# Patient Record
Sex: Female | Born: 1935 | Race: White | Hispanic: No | Smoking: Never smoker
Health system: Southern US, Community
[De-identification: ages and names within clinical notes are randomized; demographics above are authoritative.]

## PROBLEM LIST (undated history)

## (undated) DIAGNOSIS — I4821 Permanent atrial fibrillation: Secondary | ICD-10-CM

## (undated) DIAGNOSIS — I517 Cardiomegaly: Secondary | ICD-10-CM

## (undated) DIAGNOSIS — Z950 Presence of cardiac pacemaker: Secondary | ICD-10-CM

## (undated) DIAGNOSIS — E119 Type 2 diabetes mellitus without complications: Secondary | ICD-10-CM

## (undated) DIAGNOSIS — I071 Rheumatic tricuspid insufficiency: Secondary | ICD-10-CM

## (undated) DIAGNOSIS — I509 Heart failure, unspecified: Secondary | ICD-10-CM

## (undated) DIAGNOSIS — E079 Disorder of thyroid, unspecified: Secondary | ICD-10-CM

## (undated) DIAGNOSIS — I340 Nonrheumatic mitral (valve) insufficiency: Secondary | ICD-10-CM

## (undated) DIAGNOSIS — I10 Essential (primary) hypertension: Secondary | ICD-10-CM

## (undated) DIAGNOSIS — I272 Pulmonary hypertension, unspecified: Secondary | ICD-10-CM

## (undated) HISTORY — PX: ABDOMINAL HYSTERECTOMY: SHX81

## (undated) HISTORY — DX: Pulmonary hypertension, unspecified: I27.20

## (undated) HISTORY — PX: TUBAL LIGATION: SHX77

## (undated) HISTORY — PX: THYROIDECTOMY: SHX17

## (undated) HISTORY — DX: Nonrheumatic mitral (valve) insufficiency: I34.0

## (undated) HISTORY — DX: Rheumatic tricuspid insufficiency: I07.1

## (undated) HISTORY — PX: OTHER SURGICAL HISTORY: SHX169

## (undated) HISTORY — PX: CHOLECYSTECTOMY: SHX55

## (undated) HISTORY — DX: Cardiomegaly: I51.7

## (undated) HISTORY — DX: Permanent atrial fibrillation: I48.21

## (undated) HISTORY — PX: CARDIOVERSION: SHX1299

---

## 1998-04-26 ENCOUNTER — Ambulatory Visit (HOSPITAL_COMMUNITY): Admission: RE | Admit: 1998-04-26 | Discharge: 1998-04-26 | Payer: Self-pay | Admitting: Cardiovascular Disease

## 1998-04-26 ENCOUNTER — Encounter: Payer: Self-pay | Admitting: Cardiovascular Disease

## 1998-06-03 ENCOUNTER — Ambulatory Visit (HOSPITAL_COMMUNITY): Admission: RE | Admit: 1998-06-03 | Discharge: 1998-06-03 | Payer: Self-pay | Admitting: Cardiovascular Disease

## 1998-08-01 ENCOUNTER — Ambulatory Visit (HOSPITAL_COMMUNITY): Admission: RE | Admit: 1998-08-01 | Discharge: 1998-08-01 | Payer: Self-pay | Admitting: Cardiovascular Disease

## 1998-08-01 HISTORY — PX: CARDIOVERSION: SHX1299

## 1999-07-21 ENCOUNTER — Encounter: Admission: RE | Admit: 1999-07-21 | Discharge: 1999-07-21 | Payer: Self-pay | Admitting: Sports Medicine

## 1999-07-21 ENCOUNTER — Encounter: Payer: Self-pay | Admitting: Sports Medicine

## 2004-05-20 ENCOUNTER — Inpatient Hospital Stay (HOSPITAL_COMMUNITY): Admission: EM | Admit: 2004-05-20 | Discharge: 2004-05-30 | Payer: Self-pay | Admitting: Emergency Medicine

## 2004-05-26 HISTORY — PX: PACEMAKER INSERTION: SHX728

## 2009-02-28 HISTORY — PX: NM MYOCAR PERF WALL MOTION: HXRAD629

## 2011-02-25 HISTORY — PX: US ECHOCARDIOGRAPHY: HXRAD669

## 2012-06-04 ENCOUNTER — Inpatient Hospital Stay (HOSPITAL_COMMUNITY)
Admission: EM | Admit: 2012-06-04 | Discharge: 2012-06-09 | DRG: 481 | Disposition: A | Discharge status: Skilled Nursing Facility | Payer: Medicare Other | Attending: Internal Medicine | Admitting: Internal Medicine

## 2012-06-04 ENCOUNTER — Encounter (HOSPITAL_COMMUNITY): Payer: Self-pay | Admitting: Emergency Medicine

## 2012-06-04 DIAGNOSIS — R0600 Dyspnea, unspecified: Secondary | ICD-10-CM

## 2012-06-04 DIAGNOSIS — W010XXA Fall on same level from slipping, tripping and stumbling without subsequent striking against object, initial encounter: Secondary | ICD-10-CM | POA: Diagnosis present

## 2012-06-04 DIAGNOSIS — I4891 Unspecified atrial fibrillation: Secondary | ICD-10-CM

## 2012-06-04 DIAGNOSIS — R112 Nausea with vomiting, unspecified: Secondary | ICD-10-CM | POA: Diagnosis not present

## 2012-06-04 DIAGNOSIS — E119 Type 2 diabetes mellitus without complications: Secondary | ICD-10-CM

## 2012-06-04 DIAGNOSIS — Z91013 Allergy to seafood: Secondary | ICD-10-CM

## 2012-06-04 DIAGNOSIS — S42213A Unspecified displaced fracture of surgical neck of unspecified humerus, initial encounter for closed fracture: Secondary | ICD-10-CM | POA: Diagnosis present

## 2012-06-04 DIAGNOSIS — J40 Bronchitis, not specified as acute or chronic: Secondary | ICD-10-CM | POA: Diagnosis not present

## 2012-06-04 DIAGNOSIS — Z6833 Body mass index (BMI) 33.0-33.9, adult: Secondary | ICD-10-CM

## 2012-06-04 DIAGNOSIS — S72143A Displaced intertrochanteric fracture of unspecified femur, initial encounter for closed fracture: Principal | ICD-10-CM | POA: Diagnosis present

## 2012-06-04 DIAGNOSIS — D689 Coagulation defect, unspecified: Secondary | ICD-10-CM

## 2012-06-04 DIAGNOSIS — E669 Obesity, unspecified: Secondary | ICD-10-CM | POA: Diagnosis present

## 2012-06-04 DIAGNOSIS — Z7901 Long term (current) use of anticoagulants: Secondary | ICD-10-CM

## 2012-06-04 DIAGNOSIS — Z96659 Presence of unspecified artificial knee joint: Secondary | ICD-10-CM

## 2012-06-04 DIAGNOSIS — S42291A Other displaced fracture of upper end of right humerus, initial encounter for closed fracture: Secondary | ICD-10-CM

## 2012-06-04 DIAGNOSIS — R339 Retention of urine, unspecified: Secondary | ICD-10-CM | POA: Diagnosis not present

## 2012-06-04 DIAGNOSIS — D6832 Hemorrhagic disorder due to extrinsic circulating anticoagulants: Secondary | ICD-10-CM

## 2012-06-04 DIAGNOSIS — Y92009 Unspecified place in unspecified non-institutional (private) residence as the place of occurrence of the external cause: Secondary | ICD-10-CM

## 2012-06-04 DIAGNOSIS — Z8249 Family history of ischemic heart disease and other diseases of the circulatory system: Secondary | ICD-10-CM

## 2012-06-04 DIAGNOSIS — S72001D Fracture of unspecified part of neck of right femur, subsequent encounter for closed fracture with routine healing: Secondary | ICD-10-CM

## 2012-06-04 DIAGNOSIS — E8779 Other fluid overload: Secondary | ICD-10-CM | POA: Diagnosis not present

## 2012-06-04 DIAGNOSIS — Z950 Presence of cardiac pacemaker: Secondary | ICD-10-CM

## 2012-06-04 DIAGNOSIS — Z888 Allergy status to other drugs, medicaments and biological substances status: Secondary | ICD-10-CM

## 2012-06-04 DIAGNOSIS — S42301A Unspecified fracture of shaft of humerus, right arm, initial encounter for closed fracture: Secondary | ICD-10-CM

## 2012-06-04 DIAGNOSIS — Z825 Family history of asthma and other chronic lower respiratory diseases: Secondary | ICD-10-CM

## 2012-06-04 DIAGNOSIS — Z79899 Other long term (current) drug therapy: Secondary | ICD-10-CM

## 2012-06-04 DIAGNOSIS — R791 Abnormal coagulation profile: Secondary | ICD-10-CM | POA: Diagnosis present

## 2012-06-04 DIAGNOSIS — S72001A Fracture of unspecified part of neck of right femur, initial encounter for closed fracture: Secondary | ICD-10-CM

## 2012-06-04 DIAGNOSIS — D62 Acute posthemorrhagic anemia: Secondary | ICD-10-CM | POA: Diagnosis not present

## 2012-06-04 DIAGNOSIS — T45515A Adverse effect of anticoagulants, initial encounter: Secondary | ICD-10-CM | POA: Diagnosis present

## 2012-06-04 DIAGNOSIS — E876 Hypokalemia: Secondary | ICD-10-CM | POA: Diagnosis not present

## 2012-06-04 DIAGNOSIS — I10 Essential (primary) hypertension: Secondary | ICD-10-CM

## 2012-06-04 HISTORY — DX: Essential (primary) hypertension: I10

## 2012-06-04 HISTORY — DX: Disorder of thyroid, unspecified: E07.9

## 2012-06-04 HISTORY — DX: Type 2 diabetes mellitus without complications: E11.9

## 2012-06-04 HISTORY — DX: Heart failure, unspecified: I50.9

## 2012-06-04 MED ORDER — FENTANYL CITRATE 0.05 MG/ML IJ SOLN
50.0000 ug | Freq: Once | INTRAMUSCULAR | Status: AC
  Administered 2012-06-04: 50 ug via INTRAVENOUS
  Filled 2012-06-04: qty 2

## 2012-06-04 NOTE — ED Notes (Signed)
Per EMS. Patient fell at home, onto her R side. C/o R arm, shoulder, hip pain. Instability noted to R hip. Hip stabilized via sheet, arm in sling.

## 2012-06-04 NOTE — ED Notes (Signed)
Patient moving R arm. Reminded patient to not move R arm at this time due to concern of further damage if broken or dislocated. Patient states her arm feels better when she moves it. Family at bedside.

## 2012-06-04 NOTE — ED Notes (Signed)
Bed:RESB<BR> Expected date:<BR> Expected time:<BR> Means of arrival:<BR> Comments:<BR>

## 2012-06-04 NOTE — ED Notes (Signed)
Last PO intake: 1800.

## 2012-06-05 ENCOUNTER — Emergency Department (HOSPITAL_COMMUNITY): Payer: Medicare Other

## 2012-06-05 DIAGNOSIS — S72001A Fracture of unspecified part of neck of right femur, initial encounter for closed fracture: Secondary | ICD-10-CM

## 2012-06-05 DIAGNOSIS — S72009A Fracture of unspecified part of neck of unspecified femur, initial encounter for closed fracture: Secondary | ICD-10-CM

## 2012-06-05 DIAGNOSIS — S72143A Displaced intertrochanteric fracture of unspecified femur, initial encounter for closed fracture: Principal | ICD-10-CM

## 2012-06-05 DIAGNOSIS — T458X1A Poisoning by other primarily systemic and hematological agents, accidental (unintentional), initial encounter: Secondary | ICD-10-CM

## 2012-06-05 DIAGNOSIS — S42301A Unspecified fracture of shaft of humerus, right arm, initial encounter for closed fracture: Secondary | ICD-10-CM

## 2012-06-05 DIAGNOSIS — D6832 Hemorrhagic disorder due to extrinsic circulating anticoagulants: Secondary | ICD-10-CM

## 2012-06-05 DIAGNOSIS — I10 Essential (primary) hypertension: Secondary | ICD-10-CM | POA: Diagnosis present

## 2012-06-05 DIAGNOSIS — I4891 Unspecified atrial fibrillation: Secondary | ICD-10-CM | POA: Diagnosis present

## 2012-06-05 DIAGNOSIS — E119 Type 2 diabetes mellitus without complications: Secondary | ICD-10-CM | POA: Diagnosis present

## 2012-06-05 DIAGNOSIS — S42293A Other displaced fracture of upper end of unspecified humerus, initial encounter for closed fracture: Secondary | ICD-10-CM

## 2012-06-05 DIAGNOSIS — T45511A Poisoning by anticoagulants, accidental (unintentional), initial encounter: Secondary | ICD-10-CM

## 2012-06-05 LAB — CALCIUM: Calcium: 8.4 mg/dL (ref 8.4–10.5)

## 2012-06-05 LAB — CBC WITH DIFFERENTIAL/PLATELET
Basophils Absolute: 0 10*3/uL (ref 0.0–0.1)
Eosinophils Absolute: 0 10*3/uL (ref 0.0–0.7)
HCT: 40.2 % (ref 36.0–46.0)
Hemoglobin: 13.3 g/dL (ref 12.0–15.0)
Lymphs Abs: 1.4 10*3/uL (ref 0.7–4.0)
MCH: 28.4 pg (ref 26.0–34.0)
MCHC: 33.1 g/dL (ref 30.0–36.0)
Monocytes Relative: 6 % (ref 3–12)
Neutrophils Relative %: 85 % — ABNORMAL HIGH (ref 43–77)
RBC: 4.68 MIL/uL (ref 3.87–5.11)
RDW: 15.6 % — ABNORMAL HIGH (ref 11.5–15.5)
WBC Morphology: INCREASED
WBC: 15.3 10*3/uL — ABNORMAL HIGH (ref 4.0–10.5)

## 2012-06-05 LAB — TYPE AND SCREEN: Antibody Screen: NEGATIVE

## 2012-06-05 LAB — URINALYSIS, ROUTINE W REFLEX MICROSCOPIC
Bilirubin Urine: NEGATIVE
Hgb urine dipstick: NEGATIVE
Ketones, ur: NEGATIVE mg/dL
Nitrite: NEGATIVE
Protein, ur: NEGATIVE mg/dL
Urobilinogen, UA: 0.2 mg/dL (ref 0.0–1.0)

## 2012-06-05 LAB — BASIC METABOLIC PANEL
BUN: 23 mg/dL (ref 6–23)
CO2: 25 mEq/L (ref 19–32)
Calcium: 8.9 mg/dL (ref 8.4–10.5)
Chloride: 91 mEq/L — ABNORMAL LOW (ref 96–112)
Chloride: 94 mEq/L — ABNORMAL LOW (ref 96–112)
GFR calc Af Amer: 62 mL/min — ABNORMAL LOW (ref >90)
GFR calc non Af Amer: 48 mL/min — ABNORMAL LOW (ref >90)
Glucose, Bld: 236 mg/dL — ABNORMAL HIGH (ref 70–99)
Potassium: 3 mEq/L — ABNORMAL LOW (ref 3.5–5.1)
Sodium: 130 mEq/L — ABNORMAL LOW (ref 135–145)
Sodium: 134 mEq/L — ABNORMAL LOW (ref 135–145)

## 2012-06-05 LAB — CBC
HCT: 41.6 % (ref 36.0–46.0)
MCV: 85.4 fL (ref 78.0–100.0)
Platelets: 169 10*3/uL (ref 150–400)
RBC: 4.87 MIL/uL (ref 3.87–5.11)
RDW: 16 % — ABNORMAL HIGH (ref 11.5–15.5)
WBC: 12.1 10*3/uL — ABNORMAL HIGH (ref 4.0–10.5)

## 2012-06-05 LAB — ALBUMIN: Albumin: 3.1 g/dL — ABNORMAL LOW (ref 3.5–5.2)

## 2012-06-05 LAB — SAMPLE TO BLOOD BANK

## 2012-06-05 LAB — APTT: aPTT: 45 seconds — ABNORMAL HIGH (ref 24–37)

## 2012-06-05 LAB — PROTIME-INR: Prothrombin Time: 23.8 seconds — ABNORMAL HIGH (ref 11.6–15.2)

## 2012-06-05 MED ORDER — ACETAMINOPHEN 325 MG PO TABS: 650.0000 mg | ORAL_TABLET | Freq: Four times a day (QID) | ORAL | Status: DC | PRN

## 2012-06-05 MED ORDER — PANTOPRAZOLE SODIUM 40 MG IV SOLR
40.0000 mg | INTRAVENOUS | Status: DC
  Administered 2012-06-05 – 2012-06-06 (×2): 40 mg via INTRAVENOUS
  Filled 2012-06-05 (×3): qty 40

## 2012-06-05 MED ORDER — FENTANYL CITRATE 0.05 MG/ML IJ SOLN
50.0000 ug | Freq: Once | INTRAMUSCULAR | Status: AC
  Administered 2012-06-05: 50 ug via INTRAVENOUS
  Filled 2012-06-05: qty 2

## 2012-06-05 MED ORDER — ONDANSETRON HCL 4 MG/2ML IJ SOLN
4.0000 mg | Freq: Four times a day (QID) | INTRAMUSCULAR | Status: DC | PRN
  Administered 2012-06-05: 4 mg via INTRAVENOUS
  Filled 2012-06-05: qty 2

## 2012-06-05 MED ORDER — SODIUM CHLORIDE 0.9 % IV SOLN: INTRAVENOUS | Status: DC

## 2012-06-05 MED ORDER — INSULIN ASPART 100 UNIT/ML ~~LOC~~ SOLN
0.0000 [IU] | Freq: Every day | SUBCUTANEOUS | Status: DC
Start: 2012-06-05 — End: 2012-06-09

## 2012-06-05 MED ORDER — SODIUM CHLORIDE 0.9 % IJ SOLN: 3.0000 mL | Freq: Two times a day (BID) | INTRAMUSCULAR | Status: DC

## 2012-06-05 MED ORDER — DOCUSATE SODIUM 100 MG PO CAPS
100.0000 mg | ORAL_CAPSULE | Freq: Two times a day (BID) | ORAL | Status: DC
  Administered 2012-06-05 (×2): 100 mg via ORAL
  Filled 2012-06-05 (×4): qty 1

## 2012-06-05 MED ORDER — ZOLPIDEM TARTRATE 5 MG PO TABS: 5.0000 mg | ORAL_TABLET | Freq: Every evening | ORAL | Status: DC | PRN

## 2012-06-05 MED ORDER — HYDROMORPHONE HCL PF 1 MG/ML IJ SOLN: 0.5000 mg | INTRAMUSCULAR | Status: DC | PRN

## 2012-06-05 MED ORDER — FLEET ENEMA 7-19 GM/118ML RE ENEM: 1.0000 | ENEMA | Freq: Once | RECTAL | Status: AC | PRN

## 2012-06-05 MED ORDER — CEFAZOLIN SODIUM-DEXTROSE 2-3 GM-% IV SOLR: 2.0000 g | INTRAVENOUS | Status: DC

## 2012-06-05 MED ORDER — ACETAMINOPHEN 650 MG RE SUPP: 650.0000 mg | Freq: Four times a day (QID) | RECTAL | Status: DC | PRN

## 2012-06-05 MED ORDER — MORPHINE SULFATE 4 MG/ML IJ SOLN
4.0000 mg | Freq: Once | INTRAMUSCULAR | Status: AC
  Administered 2012-06-05: 4 mg via INTRAVENOUS
  Filled 2012-06-05: qty 1

## 2012-06-05 MED ORDER — BISACODYL 10 MG RE SUPP
10.0000 mg | Freq: Every day | RECTAL | Status: DC | PRN
Start: 2012-06-05 — End: 2012-06-06

## 2012-06-05 MED ORDER — VITAMIN K1 10 MG/ML IJ SOLN
5.0000 mg | Freq: Once | INTRAVENOUS | Status: AC
  Administered 2012-06-05: 5 mg via INTRAVENOUS
  Filled 2012-06-05: qty 0.5

## 2012-06-05 MED ORDER — ONDANSETRON HCL 4 MG/2ML IJ SOLN: 4.0000 mg | Freq: Four times a day (QID) | INTRAMUSCULAR | Status: DC | PRN

## 2012-06-05 MED ORDER — ONDANSETRON HCL 4 MG PO TABS: 4.0000 mg | ORAL_TABLET | Freq: Four times a day (QID) | ORAL | Status: DC | PRN

## 2012-06-05 MED ORDER — POTASSIUM CHLORIDE 10 MEQ/100ML IV SOLN
10.0000 meq | INTRAVENOUS | Status: AC
  Administered 2012-06-05 (×2): 10 meq via INTRAVENOUS
  Filled 2012-06-05 (×2): qty 100

## 2012-06-05 MED ORDER — INSULIN ASPART 100 UNIT/ML ~~LOC~~ SOLN
0.0000 [IU] | Freq: Three times a day (TID) | SUBCUTANEOUS | Status: DC
  Administered 2012-06-05 – 2012-06-07 (×3): 3 [IU] via SUBCUTANEOUS
  Administered 2012-06-07 – 2012-06-08 (×4): 2 [IU] via SUBCUTANEOUS
  Administered 2012-06-08: 3 [IU] via SUBCUTANEOUS
  Administered 2012-06-09: 2 [IU] via SUBCUTANEOUS
  Administered 2012-06-09: 3 [IU] via SUBCUTANEOUS

## 2012-06-05 MED ORDER — SODIUM CHLORIDE 0.9 % IV SOLN
INTRAVENOUS | Status: DC
  Administered 2012-06-05 (×2): via INTRAVENOUS
  Administered 2012-06-05: 75 mL/h via INTRAVENOUS
  Administered 2012-06-06: via INTRAVENOUS

## 2012-06-05 MED ORDER — PROMETHAZINE HCL 25 MG/ML IJ SOLN
12.5000 mg | Freq: Once | INTRAMUSCULAR | Status: AC
  Administered 2012-06-05: 12.5 mg via INTRAVENOUS
  Filled 2012-06-05: qty 1

## 2012-06-05 MED ORDER — SENNOSIDES-DOCUSATE SODIUM 8.6-50 MG PO TABS
1.0000 | ORAL_TABLET | Freq: Every evening | ORAL | Status: DC | PRN
  Administered 2012-06-05: 1 via ORAL
  Filled 2012-06-05: qty 1

## 2012-06-05 MED ORDER — HYDRALAZINE HCL 20 MG/ML IJ SOLN: 5.0000 mg | Freq: Four times a day (QID) | INTRAMUSCULAR | Status: DC | PRN

## 2012-06-05 MED ORDER — OXYCODONE HCL 5 MG PO TABS: 5.0000 mg | ORAL_TABLET | ORAL | Status: DC | PRN

## 2012-06-05 MED ORDER — HYDROMORPHONE HCL PF 1 MG/ML IJ SOLN
0.5000 mg | INTRAMUSCULAR | Status: DC | PRN
  Administered 2012-06-05 – 2012-06-06 (×3): 1 mg via INTRAVENOUS
  Filled 2012-06-05 (×3): qty 1

## 2012-06-05 MED ORDER — ALUM & MAG HYDROXIDE-SIMETH 200-200-20 MG/5ML PO SUSP: 30.0000 mL | Freq: Four times a day (QID) | ORAL | Status: DC | PRN

## 2012-06-05 NOTE — ED Notes (Signed)
Patient transported to X-ray 

## 2012-06-05 NOTE — Consult Note (Signed)
Reason for Consult:Right hip fracture, Right humerus fracture Referring Physician: ED  Denise [name] is an 79 y.o. female.  HPI: Dr. Eulah Pont had done R TKA approx 16 years ago per pt report.  She sustained a fall last night approximately 2030 tripped over some shoes in the dark fell on the floor landing on the right side shoulder and hip.  Denies hitting head or LOC.  She was taken to the [facility] ED by EMS where xrays revealed fractures of the right hip and right humerus.  We are consulted for this reason, patient was admitted to the medicine service.  Also with history of A-fib on coumadin, pacemaker, diabetes, CHF, HTN, thyroid disease.  Past Medical History  Diagnosis Date  . Diabetes mellitus without complication   . Thyroid disease   . A-fib   . Hypertension   . CHF (congestive heart failure)     Past Surgical History  Procedure Laterality Date  . Pacemaker insertion    . Abdominal hysterectomy    . Tubal ligation    . Cholecystectomy    . Thyroidectomy    . Knee replacement      R    History reviewed. No pertinent family history.  Social History:  reports that she has never smoked. She does not have any smokeless tobacco history on file. She reports that she does not drink alcohol or use illicit drugs.  Allergies:  Allergies  Allergen Reactions  . Iohexol Rash     Desc: VOMITING-VERIFIED ON 05-24-04 PRE-PROCEDURE/ ARS   . Shrimp (Shellfish Allergy) Nausea And Vomiting and Rash    Medications: I have reviewed the patient's current medications.  Results for orders placed during the hospital encounter of 06/04/12 (from the past 48 hour(s))  PROTIME-INR     Status: Abnormal   Collection Time    06/05/12  1:35 AM      Result Value Range   Prothrombin Time 23.8 (*) 11.6 - 15.2 seconds   INR 2.24 (*) 0.00 - 1.49  APTT     Status: Abnormal   Collection Time    06/05/12  1:35 AM      Result Value Range   aPTT 45 (*) 24 - 37 seconds   Comment:            IF BASELINE aPTT  IS ELEVATED,     SUGGEST PATIENT RISK ASSESSMENT     BE USED TO DETERMINE APPROPRIATE     ANTICOAGULANT THERAPY.  CBC WITH DIFFERENTIAL     Status: Abnormal   Collection Time    06/05/12  1:35 AM      Result Value Range   WBC 15.3 (*) 4.0 - 10.5 K/uL   RBC 4.68  3.87 - 5.11 MIL/uL   Hemoglobin 13.3  12.0 - 15.0 g/dL   HCT 16.1  09.6 - 04.5 %   MCV 85.9  78.0 - 100.0 fL   MCH 28.4  26.0 - 34.0 pg   MCHC 33.1  30.0 - 36.0 g/dL   RDW 40.9 (*) 81.1 - 91.4 %   Platelets 196  150 - 400 K/uL   Neutrophils Relative 85 (*) 43 - 77 %   Lymphocytes Relative 9 (*) 12 - 46 %   Monocytes Relative 6  3 - 12 %   Eosinophils Relative 0  0 - 5 %   Basophils Relative 0  0 - 1 %   Neutro Abs 13.0 (*) 1.7 - 7.7 K/uL   Lymphs Abs 1.4  0.7 - 4.0 K/uL   Monocytes Absolute 0.9  0.1 - 1.0 K/uL   Eosinophils Absolute 0.0  0.0 - 0.7 K/uL   Basophils Absolute 0.0  0.0 - 0.1 K/uL   WBC Morphology INCREASED BANDS (>20% BANDS)     Smear Review LARGE PLATELETS PRESENT    BASIC METABOLIC PANEL     Status: Abnormal   Collection Time    06/05/12  1:35 AM      Result Value Range   Sodium 130 (*) 135 - 145 mEq/L   Potassium 3.0 (*) 3.5 - 5.1 mEq/L   Chloride 91 (*) 96 - 112 mEq/L   CO2 25  19 - 32 mEq/L   Glucose, Bld 236 (*) 70 - 99 mg/dL   BUN 23  6 - 23 mg/dL   Creatinine, Ser 1.61  0.50 - 1.10 mg/dL   Calcium 8.9  8.4 - 09.6 mg/dL   GFR calc non Af Amer 48 (*) >90 mL/min   GFR calc Af Amer 56 (*) >90 mL/min   Comment:            The eGFR has been calculated     using the CKD EPI equation.     This calculation has not been     validated in all clinical     situations.     eGFR's persistently     <90 mL/min signify     possible Chronic Kidney Disease.  SAMPLE TO BLOOD BANK     Status: None   Collection Time    06/05/12  1:35 AM      Result Value Range   Blood Bank Specimen SAMPLE AVAILABLE FOR TESTING     Sample Expiration 06/08/2012    BASIC METABOLIC PANEL     Status: Abnormal   Collection  Time    06/05/12  5:12 AM      Result Value Range   Sodium 134 (*) 135 - 145 mEq/L   Potassium 3.3 (*) 3.5 - 5.1 mEq/L   Chloride 94 (*) 96 - 112 mEq/L   CO2 25  19 - 32 mEq/L   Glucose, Bld 230 (*) 70 - 99 mg/dL   BUN 22  6 - 23 mg/dL   Creatinine, Ser 0.45  0.50 - 1.10 mg/dL   Calcium 9.1  8.4 - 40.9 mg/dL   GFR calc non Af Amer 53 (*) >90 mL/min   GFR calc Af Amer 62 (*) >90 mL/min   Comment:            The eGFR has been calculated     using the CKD EPI equation.     This calculation has not been     validated in all clinical     situations.     eGFR's persistently     <90 mL/min signify     possible Chronic Kidney Disease.  CBC     Status: Abnormal   Collection Time    06/05/12  5:12 AM      Result Value Range   WBC 12.1 (*) 4.0 - 10.5 K/uL   RBC 4.87  3.87 - 5.11 MIL/uL   Hemoglobin 13.6  12.0 - 15.0 g/dL   HCT 81.1  91.4 - 78.2 %   MCV 85.4  78.0 - 100.0 fL   MCH 27.9  26.0 - 34.0 pg   MCHC 32.7  30.0 - 36.0 g/dL   RDW 95.6 (*) 21.3 - 08.6 %   Platelets 169  150 - 400 K/uL  Dg Shoulder Right  06/05/2012  *RADIOLOGY REPORT*  Clinical Data: Right shoulder pain following fall.  RIGHT SHOULDER - 2+ VIEW  Comparison: None  Findings: A right humeral neck fracture is identified with mild impaction and apex anterior angulation.  There is no evidence of subluxation or dislocation. No other bony abnormalities are identified. The visualized right bony thorax is unremarkable.  IMPRESSION: Right humeral neck fracture as described.   Original Report Authenticated By: Harmon Pier, M.D.    Dg Hip Complete Right  06/05/2012  *RADIOLOGY REPORT*  Clinical Data: Fall with right hip pain.  RIGHT HIP - COMPLETE 2+ VIEW  Comparison: None  Findings: An intertrochanteric right femur fracture is identified with mild varus angulation.  There is mild displacement of the lesser trochanteric fragment. There is no evidence of subluxation or dislocation. No other acute bony abnormalities are  identified.  IMPRESSION: Intertrochanteric right femur fracture with mild varus angulation.   Original Report Authenticated By: Harmon Pier, M.D.     Review of Systems  Constitutional: Negative for fever, chills and weight loss.  HENT: Negative for hearing loss, ear pain, nosebleeds and neck pain.   Eyes: Negative for blurred vision and pain.  Respiratory: Negative for cough, hemoptysis, shortness of breath and wheezing.   Cardiovascular: Negative for chest pain, orthopnea and leg swelling.  Gastrointestinal: Negative for heartburn, nausea, vomiting, abdominal pain, diarrhea, blood in stool and melena.  Genitourinary: Negative for dysuria and hematuria.  Musculoskeletal: Positive for joint pain and falls. Negative for back pain.  Skin: Negative for itching and rash.  Neurological: Negative for dizziness, tingling, focal weakness, seizures, loss of consciousness, weakness and headaches.  Endo/Heme/Allergies: Bruises/bleeds easily.  Psychiatric/Behavioral: Negative for depression and substance abuse.   Blood pressure 147/78, pulse 100, temperature 98.5 F (36.9 C), temperature source Oral, resp. rate 18, height 5\' 2"  (1.575 m), weight 83.915 kg (185 lb), SpO2 99.00%. Physical Exam  Constitutional: She is oriented to person, place, and time. She appears well-developed and well-nourished.  obese  HENT:  Head: Normocephalic and atraumatic.  Nose: Nose normal.  Eyes: Conjunctivae and EOM are normal. Pupils are equal, round, and reactive to light.  Neck: Normal range of motion. Neck supple.  Cardiovascular: Normal rate, regular rhythm, normal heart sounds and intact distal pulses.   Respiratory: Effort normal and breath sounds normal. No respiratory distress. She has no wheezes. She has no rales. She exhibits no tenderness.  GI: Soft. Bowel sounds are normal. She exhibits no distension. There is no tenderness. There is no guarding.  Musculoskeletal:       Right shoulder: She exhibits decreased  range of motion, tenderness, bony tenderness and pain. She exhibits no deformity, no laceration and normal pulse.       Right hip: She exhibits decreased range of motion, tenderness and bony tenderness. She exhibits no laceration.  RUE- NVI, SILT in all dermatomes elbow/wrist/hand with normal ROM no tenderness RLE- NVI, SILT in all dermatomes, no tenderness over knee healed surgical incision no erythema from R TKA at least 16 years ago, dorsiflexion/plantarflexion ankle intact but painful, wiggles toes, distal pulses intact  Secondary exam LUE, LLE, cervical, lumbar negative no tenderness normal ROM  Lymphadenopathy:    She has no cervical adenopathy.  Neurological: She is alert and oriented to person, place, and time. No cranial nerve deficit.  Skin: Skin is warm and dry. No rash noted. She is not diaphoretic. No erythema.  Psychiatric: She has a normal mood and affect. Her behavior is normal.  Assessment/Plan: 1. Right intertrochanteric hip fracture with mild varus angulation and lesser troch involvement 2. Right proximal humerus/humeral neck fracture with impaction, nondisplaced  Plan will be for surgical fixation of right hip, likely nailing, once INR amendable and coordinated with orthopaedic team.  Risks and benefits of the surgery discussed and patient wishes to proceed.  NWB RLE until that time, bed rest, pain control as needed.  Right humeral neck fracture likely to be managed nonoperatively in sling, ok to come out of sling for elbow/wrist/hand ROM.  She will be NWB RUE which will complicate recovery as she will be unable to use a walker.  Likely need SNF or inpatient rehab following surgery.  Postoperatively will transition back to her coumadin for dvt proph.    Margart Sickles 06/05/2012, 8:10 AM

## 2012-06-05 NOTE — ED Notes (Signed)
Family contact: Zada Girt [phone]

## 2012-06-05 NOTE — Progress Notes (Signed)
Patient seen by me.  Admitted early this AM by Dr. Jarrett Soho see H&P.   Given vit K this AM to lower INR for hip surgery.  Appreciate Ortho.  Patient resting comfortably.  Marlin Canary DO

## 2012-06-05 NOTE — ED Notes (Signed)
Patient vomited again. Dr Rulon Abide at bedside. Orders received.

## 2012-06-05 NOTE — ED Provider Notes (Signed)
Medical screening examination/treatment/procedure(s) were performed by non-physician practitioner and as supervising physician I was immediately available for consultation/collaboration.  John-Adam Jaquarius Seder, M.D.   John-Adam Raphel Stickles, MD 06/05/12 0503 

## 2012-06-05 NOTE — ED Notes (Signed)
After vomiting patient was cleaned up and moved to a hospital bed for comfort.

## 2012-06-05 NOTE — H&P (Addendum)
[facility] History and Physical  Denise [name] UXL:[id number] DOB: May 25, 1923 DOA: 06/04/2012  Referring physician: EDP PCP: No primary provider on file.   Her Primary Care Physician is in [place].    Specialists:   Chief Complaint: Right Hip and Shoulder Pain after Falling  HPI: Denise [name] is a 79 y.o. female who got up in the dark to go to her bathroom at about 8:30 pm and tripped over some shoes on the floor and fell onto her right side injuring her right shoulder and right hip.  She was taken to the ED by EMS, and found to have fractures of the right humerus, and right hip.   Dr. Madelon Lips of Orthopedics  On call for Dr Eulah Pont was contacted by the EDP , and is to see the patient in the AM.   She has a histroy of Atrial fibrillation and is on Coumadin.      Review of Systems: The patient denies anorexia, fever, chills, weight loss, vision loss, decreased hearing, hoarseness, chest pain, syncope, dyspnea on exertion, peripheral edema, balance deficits, hemoptysis, abdominal pain, nausea, vomiting, diarrhea, hematemesis, melena, hematochezia, severe indigestion/heartburn, dysuria,  hematuria, incontinence, muscle weakness, suspicious skin lesions, transient blindness, difficulty walking, depression, unusual weight change, abnormal bleeding, enlarged lymph nodes, angioedema, and breast masses.    Past Medical History  Diagnosis Date  . Diabetes mellitus without complication   . Thyroid disease   . A-fib   . Hypertension   . CHF (congestive heart failure)    Past Surgical History  Procedure Laterality Date  . Pacemaker insertion    . Abdominal hysterectomy    . Tubal ligation    . Cholecystectomy    . Thyroidectomy    . Knee replacement      R    Medications:  HOME MEDS: Prior to Admission medications   Medication Sig Start Date End Date Taking? Authorizing Provider  amLODipine (NORVASC) 5 MG tablet Take 5 mg by mouth every morning.   Yes Historical Provider, MD   atorvastatin (LIPITOR) 10 MG tablet Take 10 mg by mouth every evening.   Yes Historical Provider, MD  fish oil-omega-3 fatty acids 1000 MG capsule Take 1 g by mouth every morning.   Yes Historical Provider, MD  glipiZIDE (GLUCOTROL) 10 MG tablet Take 10 mg by mouth 2 (two) times daily before a meal.   Yes Historical Provider, MD  glucosamine-chondroitin 500-400 MG tablet Take 1 tablet by mouth every morning.   Yes Historical Provider, MD  hydrochlorothiazide (HYDRODIURIL) 25 MG tablet Take 25 mg by mouth every morning.   Yes Historical Provider, MD  isosorbide mononitrate (IMDUR) 30 MG 24 hr tablet Take 30 mg by mouth every morning.   Yes Historical Provider, MD  methimazole (TAPAZOLE) 10 MG tablet Take 10 mg by mouth every morning.   Yes Historical Provider, MD  metoprolol succinate (TOPROL-XL) 50 MG 24 hr tablet Take 50 mg by mouth every morning. Take with or immediately following a meal.   Yes Historical Provider, MD  ramipril (ALTACE) 10 MG capsule Take 10 mg by mouth every morning.   Yes Historical Provider, MD  warfarin (COUMADIN) 5 MG tablet Take 5-7.5 mg by mouth daily. Take 7.5mg  on Monday. Take 5mg  all other days of the week   Yes Historical Provider, MD    Allergies:  Allergies  Allergen Reactions  . Iohexol Rash     Desc: VOMITING-VERIFIED ON 05-24-04 PRE-PROCEDURE/ ARS   . Shrimp (Shellfish Allergy) Nausea And Vomiting  and Rash    Social History:   reports that she has never smoked. She does not have any smokeless tobacco history on file. She does not drink alcohol or use illicit drugs.  Family History:      HTN- in Father      Asthma  In Mother who died at age 73.     Review of Systems:  The patient denies anorexia, fever, weight loss,, vision loss, decreased hearing, hoarseness, chest pain, syncope, dyspnea on exertion, peripheral edema, balance deficits, hemoptysis, abdominal pain, melena, hematochezia, severe indigestion/heartburn, hematuria, incontinence, genital  sores, muscle weakness, suspicious skin lesions, transient blindness, difficulty walking, depression, unusual weight change, abnormal bleeding, enlarged lymph nodes, angioedema, and breast masses.   Physical Exam:  GEN:  Pleasant Elderly 79 y/o Obese Caucasian Female examined  and in no acute distress; cooperative with exam Filed Vitals:   06/04/12 2235 06/04/12 2241 06/05/12 0151  BP:  131/60 128/92  Temp:  98.9 F (37.2 C) 98.9 F (37.2 C)  TempSrc:  Oral Oral  Resp:  14 23  SpO2: 94% 98% 90%   Blood pressure 128/92, temperature 98.9 F (37.2 C), temperature source Oral, resp. rate 23, SpO2 90.00%. PSYCH: She is alert and oriented x4; does not appear anxious does not appear depressed; affect is normal HEENT: Normocephalic and Atraumatic, Mucous membranes pink; PERRLA; EOM intact; Fundi:  Benign;  No scleral icterus, Nares: Patent, Oropharynx: Clear, Fair Dentition, Neck:  FROM, no cervical lymphadenopathy nor thyromegaly or carotid bruit; no JVD; Breasts:: Not examined CHEST WALL: No tenderness CHEST: Normal respiration, clear to auscultation bilaterally HEART: Regular rate and rhythm; no murmurs rubs or gallops BACK: No kyphosis or scoliosis; no CVA tenderness ABDOMEN: Positive Bowel Sounds,  Obese, soft non-tender; no masses, no organomegaly.    Rectal Exam: Not done EXTREMITIES: No cyanosis, clubbing or edema; no ulcerations. Genitalia: not examined PULSES: 2+ and symmetric SKIN: Normal hydration no rash or ulceration CNS: Cranial nerves 2-12 grossly intact no focal neurologic deficit    Labs & Imaging Results for orders placed during the hospital encounter of 06/04/12 (from the past 48 hour(s))  PROTIME-INR     Status: Abnormal   Collection Time    06/05/12  1:35 AM      Result Value Range   Prothrombin Time 23.8 (*) 11.6 - 15.2 seconds   INR 2.24 (*) 0.00 - 1.49  APTT     Status: Abnormal   Collection Time    06/05/12  1:35 AM      Result Value Range   aPTT 45 (*)  24 - 37 seconds   Comment:            IF BASELINE aPTT IS ELEVATED,     SUGGEST PATIENT RISK ASSESSMENT     BE USED TO DETERMINE APPROPRIATE     ANTICOAGULANT THERAPY.  CBC WITH DIFFERENTIAL     Status: Abnormal   Collection Time    06/05/12  1:35 AM      Result Value Range   WBC 15.3 (*) 4.0 - 10.5 K/uL   RBC 4.68  3.87 - 5.11 MIL/uL   Hemoglobin 13.3  12.0 - 15.0 g/dL   HCT 19.1  47.8 - 29.5 %   MCV 85.9  78.0 - 100.0 fL   MCH 28.4  26.0 - 34.0 pg   MCHC 33.1  30.0 - 36.0 g/dL   RDW 62.1 (*) 30.8 - 65.7 %   Platelets 196  150 - 400 K/uL  Neutrophils Relative 85 (*) 43 - 77 %   Lymphocytes Relative 9 (*) 12 - 46 %   Monocytes Relative 6  3 - 12 %   Eosinophils Relative 0  0 - 5 %   Basophils Relative 0  0 - 1 %   Neutro Abs 13.0 (*) 1.7 - 7.7 K/uL   Lymphs Abs 1.4  0.7 - 4.0 K/uL   Monocytes Absolute 0.9  0.1 - 1.0 K/uL   Eosinophils Absolute 0.0  0.0 - 0.7 K/uL   Basophils Absolute 0.0  0.0 - 0.1 K/uL   WBC Morphology INCREASED BANDS (>20% BANDS)     Smear Review LARGE PLATELETS PRESENT    BASIC METABOLIC PANEL     Status: Abnormal   Collection Time    06/05/12  1:35 AM      Result Value Range   Sodium 130 (*) 135 - 145 mEq/L   Potassium 3.0 (*) 3.5 - 5.1 mEq/L   Chloride 91 (*) 96 - 112 mEq/L   CO2 25  19 - 32 mEq/L   Glucose, Bld 236 (*) 70 - 99 mg/dL   BUN 23  6 - 23 mg/dL   Creatinine, Ser 1.19  0.50 - 1.10 mg/dL   Calcium 8.9  8.4 - 14.7 mg/dL   GFR calc non Af Amer 48 (*) >90 mL/min   GFR calc Af Amer 56 (*) >90 mL/min   Comment:            The eGFR has been calculated     using the CKD EPI equation.     This calculation has not been     validated in all clinical     situations.     eGFR's persistently     <90 mL/min signify     possible Chronic Kidney Disease.  SAMPLE TO BLOOD BANK     Status: None   Collection Time    06/05/12  1:35 AM      Result Value Range   Blood Bank Specimen SAMPLE AVAILABLE FOR TESTING     Sample Expiration 06/08/2012        Radiological Exams on Admission: Dg Shoulder Right  06/05/2012  *RADIOLOGY REPORT*  Clinical Data: Right shoulder pain following fall.  RIGHT SHOULDER - 2+ VIEW  Comparison: None  Findings: A right humeral neck fracture is identified with mild impaction and apex anterior angulation.  There is no evidence of subluxation or dislocation. No other bony abnormalities are identified. The visualized right bony thorax is unremarkable.  IMPRESSION: Right humeral neck fracture as described.   Original Report Authenticated By: Harmon Pier, M.D.    Dg Hip Complete Right  06/05/2012  *RADIOLOGY REPORT*  Clinical Data: Fall with right hip pain.  RIGHT HIP - COMPLETE 2+ VIEW  Comparison: None  Findings: An intertrochanteric right femur fracture is identified with mild varus angulation.  There is mild displacement of the lesser trochanteric fragment. There is no evidence of subluxation or dislocation. No other acute bony abnormalities are identified.  IMPRESSION: Intertrochanteric right femur fracture with mild varus angulation.   Original Report Authenticated By: Harmon Pier, M.D.      Assessment/Plan Principal Problem:   Fracture of right hip Active Problems:   Fracture of right humerus   Atrial fibrillation   DM (diabetes mellitus)   HTN (hypertension)   Hypertension   Warfarin-induced coagulopathy    Hypokalemia  1.   Fractures of Right Hip and Right Humerus- Orthopedics to See in AM.  Pain  Control PRN.     2.   Atrial fibrillation on Coumadin.    3.   DM2-   SSI coverage PRN.     4.   HTN-  Stable on Meds  5.   Coagulopathy due to Coumadin-   Reverse Coumadin   6. Hypokalemia- Replete K+, and  Check magnesium.       Code Status:   FULL CODE Family Communication:  Daughter at Bedside Disposition Plan:  TBA  Time spent: 54 Minutes  Ron Parker [facility] Pager [phone]  If 7PM-7AM, please contact night-coverage [email or web address] Password [facility] 06/05/2012, 3:34  AM

## 2012-06-05 NOTE — ED Provider Notes (Signed)
History     CSN: [id number]  Arrival date & time 06/04/12  2234   First MD Initiated Contact with Patient 06/05/12 0041      Chief Complaint  Patient presents with  . Fall    (Consider location/radiation/quality/duration/timing/severity/associated sxs/prior treatment) Patient is a 79 y.o. female presenting with fall. The history is provided by the patient. No language interpreter was used.  Fall The accident occurred 1 to 2 hours ago. Pertinent negatives include no fever, no numbness, no abdominal pain, no nausea and no vomiting. Associated symptoms comments: She fell at home after tripping while walking in the dark. She fell onto the right side injuring her right hip and right shoulder. She denies head injury, chest pain, abdominal pain or neck pain. No LOC. No N, V. .    Past Medical History  Diagnosis Date  . Diabetes mellitus without complication   . Thyroid disease   . A-fib   . Hypertension   . CHF (congestive heart failure)     Past Surgical History  Procedure Laterality Date  . Pacemaker insertion    . Abdominal hysterectomy    . Tubal ligation    . Cholecystectomy    . Thyroidectomy    . Knee replacement      R    History reviewed. No pertinent family history.  History  Substance Use Topics  . Smoking status: Never Smoker   . Smokeless tobacco: Not on file  . Alcohol Use: No    OB History   Grav Para Term Preterm Abortions TAB SAB Ect Mult Living                  Review of Systems  Constitutional: Negative for fever.  HENT: Negative for neck pain.   Respiratory: Negative for shortness of breath.   Cardiovascular: Negative for chest pain.  Gastrointestinal: Negative for nausea, vomiting and abdominal pain.  Genitourinary: Negative for dysuria.  Musculoskeletal: Positive for arthralgias. Negative for back pain.  Skin: Negative for wound.  Neurological: Negative for numbness.  Hematological: Bruises/bleeds easily.  Psychiatric/Behavioral:  Negative for confusion.    Allergies  Iohexol and Shrimp  Home Medications   Current Outpatient Rx  Name  Route  Sig  Dispense  Refill  . amLODipine (NORVASC) 5 MG tablet   Oral   Take 5 mg by mouth every morning.         [name] atorvastatin (LIPITOR) 10 MG tablet   Oral   Take 10 mg by mouth every evening.         . fish oil-omega-3 fatty acids 1000 MG capsule   Oral   Take 1 g by mouth every morning.         [name] glipiZIDE (GLUCOTROL) 10 MG tablet   Oral   Take 10 mg by mouth 2 (two) times daily before a meal.         . glucosamine-chondroitin 500-400 MG tablet   Oral   Take 1 tablet by mouth every morning.         . hydrochlorothiazide (HYDRODIURIL) 25 MG tablet   Oral   Take 25 mg by mouth every morning.         . isosorbide mononitrate (IMDUR) 30 MG 24 hr tablet   Oral   Take 30 mg by mouth every morning.         . methimazole (TAPAZOLE) 10 MG tablet   Oral   Take 10 mg by mouth every morning.         [name]  metoprolol succinate (TOPROL-XL) 50 MG 24 hr tablet   Oral   Take 50 mg by mouth every morning. Take with or immediately following a meal.         . ramipril (ALTACE) 10 MG capsule   Oral   Take 10 mg by mouth every morning.         . warfarin (COUMADIN) 5 MG tablet   Oral   Take 5-7.5 mg by mouth daily. Take 7.5mg  on Monday. Take 5mg  all other days of the week           BP 128/92  Temp(Src) 98.9 F (37.2 C) (Oral)  Resp 23  SpO2 90%  Physical Exam  Constitutional: She appears well-developed and well-nourished. No distress.  HENT:  Head: Normocephalic and atraumatic.  Eyes: Conjunctivae are normal.  Neck: Normal range of motion. Neck supple.  Cardiovascular: Normal rate and regular rhythm.   No murmur heard. Pulmonary/Chest: Effort normal and breath sounds normal. She has no wheezes. She has no rales. She exhibits no tenderness.  Abdominal: Soft. Bowel sounds are normal. There is no tenderness. There is no rebound and no  guarding.  Musculoskeletal: Normal range of motion.  Right proximal upper arm tender to any palpation. No bony deformity noted in obese body habitus. No discoloration. Clavicle, elbow and distal arm nontender. No neck or back tenderness. Left arm and left leg are nontender with FROM>  Neurological: She is alert. No cranial nerve deficit.  Skin: Skin is warm and dry. No rash noted.  Psychiatric: She has a normal mood and affect.    ED Course  Procedures (including critical care time)  Labs Reviewed  CBC WITH DIFFERENTIAL - Abnormal; Notable for the following:    WBC 15.3 (*)    RDW 15.6 (*)    All other components within normal limits  BASIC METABOLIC PANEL - Abnormal; Notable for the following:    Sodium 130 (*)    Potassium 3.0 (*)    Chloride 91 (*)    Glucose, Bld 236 (*)    GFR calc non Af Amer 48 (*)    GFR calc Af Amer 56 (*)    All other components within normal limits  PROTIME-INR  APTT  SAMPLE TO BLOOD BANK   Dg Shoulder Right  06/05/2012  *RADIOLOGY REPORT*  Clinical Data: Right shoulder pain following fall.  RIGHT SHOULDER - 2+ VIEW  Comparison: None  Findings: A right humeral neck fracture is identified with mild impaction and apex anterior angulation.  There is no evidence of subluxation or dislocation. No other bony abnormalities are identified. The visualized right bony thorax is unremarkable.  IMPRESSION: Right humeral neck fracture as described.   Original Report Authenticated By: Harmon Pier, M.D.    Dg Hip Complete Right  06/05/2012  *RADIOLOGY REPORT*  Clinical Data: Fall with right hip pain.  RIGHT HIP - COMPLETE 2+ VIEW  Comparison: None  Findings: An intertrochanteric right femur fracture is identified with mild varus angulation.  There is mild displacement of the lesser trochanteric fragment. There is no evidence of subluxation or dislocation. No other acute bony abnormalities are identified.  IMPRESSION: Intertrochanteric right femur fracture with mild varus  angulation.   Original Report Authenticated By: Harmon Pier, M.D.      No diagnosis found.  1. Right intertrochanteric fracture 2. Humeral head fracture 3. Coagulopathy secondary to Warfarin 4. Fall   MDM  Patient on coumadin for A-fib presents with multiple fractures after mechanical fall at home. Pain  is managed. She is alert and oriented without confusion or altered mental status. [facility] paged for admission.        Arnoldo Hooker, PA-C 06/05/12 [phone]

## 2012-06-06 ENCOUNTER — Inpatient Hospital Stay (HOSPITAL_COMMUNITY): Payer: Medicare Other

## 2012-06-06 ENCOUNTER — Inpatient Hospital Stay (HOSPITAL_COMMUNITY): Payer: Medicare Other | Admitting: Anesthesiology

## 2012-06-06 ENCOUNTER — Encounter (HOSPITAL_COMMUNITY): Payer: Self-pay | Admitting: Anesthesiology

## 2012-06-06 ENCOUNTER — Encounter (HOSPITAL_COMMUNITY)
Admission: EM | Disposition: A | Discharge status: Skilled Nursing Facility | Payer: Self-pay | Source: Home / Self Care | Attending: Internal Medicine

## 2012-06-06 DIAGNOSIS — D689 Coagulation defect, unspecified: Secondary | ICD-10-CM

## 2012-06-06 HISTORY — PX: COMPRESSION HIP SCREW: SHX1386

## 2012-06-06 LAB — URINALYSIS, ROUTINE W REFLEX MICROSCOPIC
Ketones, ur: NEGATIVE mg/dL
Nitrite: NEGATIVE
pH: 5 (ref 5.0–8.0)

## 2012-06-06 LAB — CBC
Hemoglobin: 11.6 g/dL — ABNORMAL LOW (ref 12.0–15.0)
MCH: 28.9 pg (ref 26.0–34.0)
MCH: 29 pg (ref 26.0–34.0)
MCHC: 33 g/dL (ref 30.0–36.0)
MCV: 87.8 fL (ref 78.0–100.0)
MCV: 87.9 fL (ref 78.0–100.0)
Platelets: 126 10*3/uL — ABNORMAL LOW (ref 150–400)
Platelets: 155 10*3/uL (ref 150–400)
RBC: 4.01 MIL/uL (ref 3.87–5.11)
RBC: 3.97 MIL/uL (ref 3.87–5.11)
WBC: 8.6 10*3/uL (ref 4.0–10.5)

## 2012-06-06 LAB — BASIC METABOLIC PANEL
BUN: 22 mg/dL (ref 6–23)
Chloride: 99 mEq/L (ref 96–112)
Creatinine, Ser: 1.02 mg/dL (ref 0.50–1.10)
GFR calc Af Amer: 56 mL/min — ABNORMAL LOW (ref >90)
GFR calc non Af Amer: 48 mL/min — ABNORMAL LOW (ref >90)

## 2012-06-06 LAB — SURGICAL PCR SCREEN: MRSA, PCR: NEGATIVE

## 2012-06-06 LAB — URINE CULTURE: Culture: NO GROWTH

## 2012-06-06 LAB — URINE MICROSCOPIC-ADD ON

## 2012-06-06 LAB — GLUCOSE, CAPILLARY: Glucose-Capillary: 171 mg/dL — ABNORMAL HIGH (ref 70–99)

## 2012-06-06 LAB — PROTIME-INR: Prothrombin Time: 16.6 seconds — ABNORMAL HIGH (ref 11.6–15.2)

## 2012-06-06 SURGERY — COMPRESSION HIP
Anesthesia: General | Site: Hip | Laterality: Right | Wound class: Clean

## 2012-06-06 MED ORDER — METOPROLOL SUCCINATE ER 50 MG PO TB24
50.0000 mg | ORAL_TABLET | Freq: Every day | ORAL | Status: DC
  Administered 2012-06-07 – 2012-06-09 (×3): 50 mg via ORAL
  Filled 2012-06-06 (×4): qty 1

## 2012-06-06 MED ORDER — ENOXAPARIN SODIUM 40 MG/0.4ML ~~LOC~~ SOLN
40.0000 mg | SUBCUTANEOUS | Status: DC
  Administered 2012-06-07 – 2012-06-09 (×3): 40 mg via SUBCUTANEOUS
  Filled 2012-06-06 (×4): qty 0.4

## 2012-06-06 MED ORDER — CEFAZOLIN SODIUM-DEXTROSE 2-3 GM-% IV SOLR
2.0000 g | INTRAVENOUS | Status: AC
  Administered 2012-06-06: 2 g via INTRAVENOUS
  Filled 2012-06-06: qty 50

## 2012-06-06 MED ORDER — ACETAMINOPHEN 10 MG/ML IV SOLN
1000.0000 mg | Freq: Four times a day (QID) | INTRAVENOUS | Status: AC
  Administered 2012-06-06 – 2012-06-07 (×4): 1000 mg via INTRAVENOUS
  Filled 2012-06-06 (×4): qty 100

## 2012-06-06 MED ORDER — CEFAZOLIN SODIUM-DEXTROSE 2-3 GM-% IV SOLR
INTRAVENOUS | Status: AC
  Filled 2012-06-06: qty 100

## 2012-06-06 MED ORDER — SENNOSIDES-DOCUSATE SODIUM 8.6-50 MG PO TABS: 1.0000 | ORAL_TABLET | Freq: Every evening | ORAL | Status: DC | PRN

## 2012-06-06 MED ORDER — DOCUSATE SODIUM 100 MG PO CAPS
100.0000 mg | ORAL_CAPSULE | Freq: Two times a day (BID) | ORAL | Status: DC
  Administered 2012-06-06 – 2012-06-09 (×6): 100 mg via ORAL
  Filled 2012-06-06 (×7): qty 1

## 2012-06-06 MED ORDER — METHIMAZOLE 10 MG PO TABS
10.0000 mg | ORAL_TABLET | Freq: Every morning | ORAL | Status: DC
  Administered 2012-06-07 – 2012-06-09 (×3): 10 mg via ORAL
  Filled 2012-06-06 (×3): qty 1

## 2012-06-06 MED ORDER — METOCLOPRAMIDE HCL 10 MG PO TABS: 5.0000 mg | ORAL_TABLET | Freq: Three times a day (TID) | ORAL | Status: DC | PRN

## 2012-06-06 MED ORDER — PHENOL 1.4 % MT LIQD: 1.0000 | OROMUCOSAL | Status: DC | PRN

## 2012-06-06 MED ORDER — ALUM & MAG HYDROXIDE-SIMETH 200-200-20 MG/5ML PO SUSP: 30.0000 mL | ORAL | Status: DC | PRN

## 2012-06-06 MED ORDER — WARFARIN SODIUM 7.5 MG PO TABS
7.5000 mg | ORAL_TABLET | Freq: Once | ORAL | Status: AC
  Administered 2012-06-06: 7.5 mg via ORAL
  Filled 2012-06-06: qty 1

## 2012-06-06 MED ORDER — FLEET ENEMA 7-19 GM/118ML RE ENEM: 1.0000 | ENEMA | Freq: Once | RECTAL | Status: AC | PRN

## 2012-06-06 MED ORDER — HYDROMORPHONE HCL PF 1 MG/ML IJ SOLN
0.2500 mg | INTRAMUSCULAR | Status: DC | PRN
  Administered 2012-06-06: 0.5 mg via INTRAVENOUS

## 2012-06-06 MED ORDER — FENTANYL CITRATE 0.05 MG/ML IJ SOLN
INTRAMUSCULAR | Status: DC | PRN
  Administered 2012-06-06 (×2): 100 ug via INTRAVENOUS

## 2012-06-06 MED ORDER — CEFAZOLIN SODIUM-DEXTROSE 2-3 GM-% IV SOLR
2.0000 g | Freq: Four times a day (QID) | INTRAVENOUS | Status: AC
  Administered 2012-06-06 – 2012-06-07 (×2): 2 g via INTRAVENOUS
  Filled 2012-06-06 (×2): qty 50

## 2012-06-06 MED ORDER — 0.9 % SODIUM CHLORIDE (POUR BTL) OPTIME
TOPICAL | Status: DC | PRN
  Administered 2012-06-06: 1000 mL

## 2012-06-06 MED ORDER — HYDROCHLOROTHIAZIDE 25 MG PO TABS
25.0000 mg | ORAL_TABLET | Freq: Every morning | ORAL | Status: DC
  Administered 2012-06-07 – 2012-06-09 (×3): 25 mg via ORAL
  Filled 2012-06-06 (×3): qty 1

## 2012-06-06 MED ORDER — ACETAMINOPHEN 650 MG RE SUPP: 650.0000 mg | Freq: Four times a day (QID) | RECTAL | Status: DC | PRN

## 2012-06-06 MED ORDER — AMLODIPINE BESYLATE 5 MG PO TABS
5.0000 mg | ORAL_TABLET | Freq: Every morning | ORAL | Status: DC
  Administered 2012-06-07 – 2012-06-09 (×3): 5 mg via ORAL
  Filled 2012-06-06 (×3): qty 1

## 2012-06-06 MED ORDER — ZOLPIDEM TARTRATE 5 MG PO TABS: 5.0000 mg | ORAL_TABLET | Freq: Every evening | ORAL | Status: DC | PRN

## 2012-06-06 MED ORDER — METHOCARBAMOL 500 MG PO TABS
500.0000 mg | ORAL_TABLET | Freq: Four times a day (QID) | ORAL | Status: DC | PRN
  Administered 2012-06-07 – 2012-06-09 (×4): 500 mg via ORAL
  Filled 2012-06-06 (×5): qty 1

## 2012-06-06 MED ORDER — METOCLOPRAMIDE HCL 5 MG/ML IJ SOLN: 5.0000 mg | Freq: Three times a day (TID) | INTRAMUSCULAR | Status: DC | PRN

## 2012-06-06 MED ORDER — ACETAMINOPHEN 10 MG/ML IV SOLN
INTRAVENOUS | Status: AC
  Filled 2012-06-06: qty 100

## 2012-06-06 MED ORDER — HYDROMORPHONE HCL PF 1 MG/ML IJ SOLN
INTRAMUSCULAR | Status: AC
  Filled 2012-06-06: qty 1

## 2012-06-06 MED ORDER — BISACODYL 5 MG PO TBEC
5.0000 mg | DELAYED_RELEASE_TABLET | Freq: Every day | ORAL | Status: DC | PRN
  Administered 2012-06-08: 5 mg via ORAL
  Filled 2012-06-06: qty 1

## 2012-06-06 MED ORDER — ONDANSETRON HCL 4 MG PO TABS: 4.0000 mg | ORAL_TABLET | Freq: Four times a day (QID) | ORAL | Status: DC | PRN

## 2012-06-06 MED ORDER — NEOSTIGMINE METHYLSULFATE 1 MG/ML IJ SOLN
INTRAMUSCULAR | Status: DC | PRN
  Administered 2012-06-06: 5 mg via INTRAVENOUS

## 2012-06-06 MED ORDER — ROCURONIUM BROMIDE 100 MG/10ML IV SOLN
INTRAVENOUS | Status: DC | PRN
  Administered 2012-06-06: 40 mg via INTRAVENOUS

## 2012-06-06 MED ORDER — DEXTROSE 5 % IV SOLN
500.0000 mg | Freq: Four times a day (QID) | INTRAVENOUS | Status: DC | PRN
  Filled 2012-06-06: qty 5

## 2012-06-06 MED ORDER — ATORVASTATIN CALCIUM 10 MG PO TABS
10.0000 mg | ORAL_TABLET | Freq: Every evening | ORAL | Status: DC
  Administered 2012-06-06 – 2012-06-08 (×3): 10 mg via ORAL
  Filled 2012-06-06 (×4): qty 1

## 2012-06-06 MED ORDER — ACETAMINOPHEN 325 MG PO TABS: 650.0000 mg | ORAL_TABLET | Freq: Four times a day (QID) | ORAL | Status: DC | PRN

## 2012-06-06 MED ORDER — MORPHINE SULFATE 2 MG/ML IJ SOLN: 0.5000 mg | INTRAMUSCULAR | Status: DC | PRN

## 2012-06-06 MED ORDER — ONDANSETRON HCL 4 MG/2ML IJ SOLN
INTRAMUSCULAR | Status: DC | PRN
  Administered 2012-06-06: 4 mg via INTRAVENOUS

## 2012-06-06 MED ORDER — WARFARIN - PHARMACIST DOSING INPATIENT: Freq: Every day | Status: DC

## 2012-06-06 MED ORDER — LACTATED RINGERS IV SOLN
INTRAVENOUS | Status: DC | PRN
  Administered 2012-06-06 (×2): via INTRAVENOUS

## 2012-06-06 MED ORDER — OXYCODONE HCL 5 MG PO TABS
5.0000 mg | ORAL_TABLET | ORAL | Status: DC | PRN
  Administered 2012-06-07 – 2012-06-09 (×6): 5 mg via ORAL
  Filled 2012-06-06 (×7): qty 1

## 2012-06-06 MED ORDER — ISOSORBIDE MONONITRATE ER 30 MG PO TB24
30.0000 mg | ORAL_TABLET | Freq: Every morning | ORAL | Status: DC
  Administered 2012-06-07 – 2012-06-09 (×3): 30 mg via ORAL
  Filled 2012-06-06 (×3): qty 1

## 2012-06-06 MED ORDER — RAMIPRIL 10 MG PO CAPS
10.0000 mg | ORAL_CAPSULE | Freq: Every morning | ORAL | Status: DC
  Administered 2012-06-07 – 2012-06-09 (×3): 10 mg via ORAL
  Filled 2012-06-06 (×3): qty 1

## 2012-06-06 MED ORDER — PROPOFOL 10 MG/ML IV BOLUS
INTRAVENOUS | Status: DC | PRN
  Administered 2012-06-06: 100 mg via INTRAVENOUS

## 2012-06-06 MED ORDER — SODIUM CHLORIDE 0.9 % IV SOLN
INTRAVENOUS | Status: DC
  Administered 2012-06-06: 14:00:00 via INTRAVENOUS

## 2012-06-06 MED ORDER — ONDANSETRON HCL 4 MG/2ML IJ SOLN: 4.0000 mg | Freq: Four times a day (QID) | INTRAMUSCULAR | Status: DC | PRN

## 2012-06-06 MED ORDER — GLIPIZIDE 10 MG PO TABS
10.0000 mg | ORAL_TABLET | Freq: Two times a day (BID) | ORAL | Status: DC
  Administered 2012-06-07 – 2012-06-09 (×5): 10 mg via ORAL
  Filled 2012-06-06 (×7): qty 1

## 2012-06-06 MED ORDER — GLYCOPYRROLATE 0.2 MG/ML IJ SOLN
INTRAMUSCULAR | Status: DC | PRN
  Administered 2012-06-06: .6 mg via INTRAVENOUS

## 2012-06-06 MED ORDER — CHLORHEXIDINE GLUCONATE 4 % EX LIQD
60.0000 mL | Freq: Once | CUTANEOUS | Status: DC
  Filled 2012-06-06: qty 60

## 2012-06-06 MED ORDER — METOPROLOL SUCCINATE ER 50 MG PO TB24
50.0000 mg | ORAL_TABLET | Freq: Every day | ORAL | Status: DC
  Administered 2012-06-06: 50 mg via ORAL
  Filled 2012-06-06: qty 1

## 2012-06-06 MED ORDER — MENTHOL 3 MG MT LOZG: 1.0000 | LOZENGE | OROMUCOSAL | Status: DC | PRN

## 2012-06-06 MED ORDER — SODIUM CHLORIDE 0.9 % IV SOLN: INTRAVENOUS | Status: DC

## 2012-06-06 SURGICAL SUPPLY — 40 items
BANDAGE GAUZE ELAST BULKY 4 IN (GAUZE/BANDAGES/DRESSINGS) ×2 IMPLANT
BIT DRILL Q/COUPLING 1 (BIT) ×1 IMPLANT
BNDG COHESIVE 4X5 TAN STRL (GAUZE/BANDAGES/DRESSINGS) ×2 IMPLANT
CLOTH BEACON ORANGE TIMEOUT ST (SAFETY) ×2 IMPLANT
DECANTER SPIKE VIAL GLASS SM (MISCELLANEOUS) ×2 IMPLANT
DHS/DCS Lag Screw 14mmx100mm ×1 IMPLANT
DRAPE STERI IOBAN 125X83 (DRAPES) ×2 IMPLANT
DRSG MEPILEX BORDER 4X4 (GAUZE/BANDAGES/DRESSINGS) ×2 IMPLANT
DRSG MEPILEX BORDER 4X8 (GAUZE/BANDAGES/DRESSINGS) ×2 IMPLANT
DRSG PAD ABDOMINAL 8X10 ST (GAUZE/BANDAGES/DRESSINGS) ×2 IMPLANT
DURAPREP 26ML APPLICATOR (WOUND CARE) ×2 IMPLANT
ELECT CAUTERY BLADE 6.4 (BLADE) ×2 IMPLANT
ELECT REM PT RETURN 9FT ADLT (ELECTROSURGICAL) ×2
ELECTRODE REM PT RTRN 9FT ADLT (ELECTROSURGICAL) ×1 IMPLANT
EVACUATOR 1/8 PVC DRAIN (DRAIN) IMPLANT
FACESHIELD LNG OPTICON STERILE (SAFETY) IMPLANT
GAUZE XEROFORM 5X9 LF (GAUZE/BANDAGES/DRESSINGS) ×2 IMPLANT
GLOVE BIOGEL PI IND STRL 7.5 (GLOVE) IMPLANT
GLOVE BIOGEL PI IND STRL 8.5 (GLOVE) ×2 IMPLANT
GLOVE BIOGEL PI INDICATOR 7.5 (GLOVE)
GLOVE BIOGEL PI INDICATOR 8.5 (GLOVE) ×2
GLOVE SURG ORTHO 7.0 STRL STRW (GLOVE) IMPLANT
GLOVE SURG ORTHO 8.0 STRL STRW (GLOVE) ×4 IMPLANT
GOWN PREVENTION PLUS XLARGE (GOWN DISPOSABLE) ×4 IMPLANT
GOWN STRL NON-REIN LRG LVL3 (GOWN DISPOSABLE) ×4 IMPLANT
GUIDEPIN DHS/DCS (PIN) ×1 IMPLANT
KIT BASIN OR (CUSTOM PROCEDURE TRAY) ×2 IMPLANT
KIT ROOM TURNOVER OR (KITS) ×2 IMPLANT
MANIFOLD NEPTUNE II (INSTRUMENTS) ×2 IMPLANT
NS IRRIG 1000ML POUR BTL (IV SOLUTION) ×2 IMPLANT
PACK GENERAL/GYN (CUSTOM PROCEDURE TRAY) ×2 IMPLANT
PAD ARMBOARD 7.5X6 YLW CONV (MISCELLANEOUS) ×4 IMPLANT
PLATE DHS 135 DEG/4HOLE (Plate) ×1 IMPLANT
SCREW COMPRESS 36MM DHS/DCS (Orthopedic Implant) ×1 IMPLANT
SCREW CORTEX ST 4.5X36 (Screw) ×2 IMPLANT
SCREW CORTEX ST 4.5X40 (Screw) ×2 IMPLANT
STAPLER VISISTAT 35W (STAPLE) ×2 IMPLANT
SUT VIC AB 0 CTB1 27 (SUTURE) ×4 IMPLANT
SUT VIC AB 1 CTB1 27 (SUTURE) ×2 IMPLANT
WATER STERILE IRR 1000ML POUR (IV SOLUTION) ×4 IMPLANT

## 2012-06-06 NOTE — Consult Note (Signed)
Denise [name] MRN:  [id number] DOB/SEX:  [birth year]/female  CHIEF COMPLAINT:  Painful right hip and shoulder  HISTORY:Denise [name] is an 79 y.o. female. Dr. Eulah Pont had done R TKA approx 16 years ago per pt report. She sustained a fall last night approximately 2030 tripped over some shoes in the dark fell on the floor landing on the right side shoulder and hip. Denies hitting head or LOC. She was taken to the [facility] ED by EMS where xrays revealed fractures of the right hip and right humerus. We are consulted for this reason, patient was admitted to the medicine service. Also with history of A-fib on coumadin, pacemaker, diabetes, CHF, HTN, thyroid disease. Dr Madelon Lips was called for consult and patient was placed on schedule for right hip orif Sunday but INR was too high.  [place]ucey consulted today   PAST MEDICAL HISTORY: Patient Active Problem List   Diagnosis Date Noted  . Fracture of right hip 06/05/2012  . Fracture of right humerus 06/05/2012  . Atrial fibrillation 06/05/2012  . DM (diabetes mellitus) 06/05/2012  . HTN (hypertension) 06/05/2012  . Hypertension 06/05/2012  . Warfarin-induced coagulopathy 06/05/2012   Past Medical History  Diagnosis Date  . Diabetes mellitus without complication   . Thyroid disease   . A-fib   . Hypertension   . CHF (congestive heart failure)    Past Surgical History  Procedure Laterality Date  . Pacemaker insertion    . Abdominal hysterectomy    . Tubal ligation    . Cholecystectomy    . Thyroidectomy    . Knee replacement      R     MEDICATIONS:   Prescriptions prior to admission  Medication Sig Dispense Refill  . amLODipine (NORVASC) 5 MG tablet Take 5 mg by mouth every morning.      [name] atorvastatin (LIPITOR) 10 MG tablet Take 10 mg by mouth every evening.      . fish oil-omega-3 fatty acids 1000 MG capsule Take 1 g by mouth every morning.      [name] glipiZIDE (GLUCOTROL) 10 MG tablet Take 10 mg by mouth 2 (two) times daily before a meal.       . glucosamine-chondroitin 500-400 MG tablet Take 1 tablet by mouth every morning.      . hydrochlorothiazide (HYDRODIURIL) 25 MG tablet Take 25 mg by mouth every morning.      . isosorbide mononitrate (IMDUR) 30 MG 24 hr tablet Take 30 mg by mouth every morning.      . methimazole (TAPAZOLE) 10 MG tablet Take 10 mg by mouth every morning.      . metoprolol succinate (TOPROL-XL) 50 MG 24 hr tablet Take 50 mg by mouth every morning. Take with or immediately following a meal.      . ramipril (ALTACE) 10 MG capsule Take 10 mg by mouth every morning.      . warfarin (COUMADIN) 5 MG tablet Take 5-7.5 mg by mouth daily. Take 7.5mg  on Monday. Take 5mg  all other days of the week        ALLERGIES:   Allergies  Allergen Reactions  . Iohexol Rash     Desc: VOMITING-VERIFIED ON 05-24-04 PRE-PROCEDURE/ ARS   . Shrimp (Shellfish Allergy) Nausea And Vomiting and Rash    REVIEW OF SYSTEMS:  Pertinent items are noted in HPI.   FAMILY HISTORY:  History reviewed. No pertinent family history.  SOCIAL HISTORY:   History  Substance Use Topics  . Smoking status: Never Smoker   .  Smokeless tobacco: Not on file  . Alcohol Use: No     EXAMINATION:  Vital signs in last 24 hours: Temp:  [99 F (37.2 C)-99.7 F (37.6 C)] 99.1 F (37.3 C) (03/24 1516) Pulse Rate:  [71-77] 77 (03/24 1554) Resp:  [16-24] 24 (03/24 1516) BP: (115-162)/(42-85) 144/85 mmHg (03/24 1554) SpO2:  [91 %-100 %] 93 % (03/24 1516)  General appearance: alert, cooperative and no distress Lungs: clear to auscultation bilaterally Heart: regular rate and rhythm, S1, S2 normal, no murmur, click, rub or gallop Abdomen: soft, non-tender; bowel sounds normal; no masses,  no organomegaly Pelvic: cervix normal in appearance, external genitalia normal, no adnexal masses or tenderness, no cervical motion tenderness, rectovaginal septum normal, uterus normal size, shape, and consistency, vagina normal without discharge and none Extremities:  extremities normal, atraumatic, no cyanosis or edema and Homans sign is negative, no sign of DVT Pulses: 2+ and symmetric Skin: Skin color, texture, turgor normal. No rashes or lesions Neurologic: Alert and oriented X 3, normal strength and tone. Normal symmetric reflexes. Normal coordination and gait   Imaging Review Dg Shoulder Right   06/05/2012 *RADIOLOGY REPORT* Clinical Data: Right shoulder pain following fall. RIGHT SHOULDER - 2+ VIEW Comparison: None Findings: A right humeral neck fracture is identified with mild impaction and apex anterior angulation. There is no evidence of subluxation or dislocation. No other bony abnormalities are identified. The visualized right bony thorax is unremarkable. IMPRESSION: Right humeral neck fracture as described. Original Report Authenticated By: Harmon Pier, M.D.   Dg Hip Complete Right   06/05/2012 *RADIOLOGY REPORT* Clinical Data: Fall with right hip pain. RIGHT HIP - COMPLETE 2+ VIEW Comparison: None Findings: An intertrochanteric right femur fracture is identified with mild varus angulation. There is mild displacement of the lesser trochanteric fragment. There is no evidence of subluxation or dislocation. No other acute bony abnormalities are identified. IMPRESSION: Intertrochanteric right femur fracture with mild varus angulation. Original Report Authenticated By: Harmon Pier, M.D.       Assessment/Plan: Right hip and proximal humerus fractures  Right proximal fracture - nonsurgical  Right hip orif 06/06/12  Denise [name] 06/06/2012, 5:49 PM

## 2012-06-06 NOTE — Progress Notes (Signed)
Patient transferred to [facility], 5N 20C-01, Report given to [facility] d/c'd spoke with Daren ,CCM.Patient alert and oriented, requested/given pain meds prior to transport. [facility] called for transport.

## 2012-06-06 NOTE — Anesthesia Procedure Notes (Signed)
Date/Time: 06/06/2012 5:38 PM Performed by: Coralee Rud Pre-anesthesia Checklist: Patient identified, Emergency Drugs available, Suction available, Patient being monitored and Timeout performed Patient Re-evaluated:Patient Re-evaluated prior to inductionOxygen Delivery Method: Circle system utilized Preoxygenation: Pre-oxygenation with 100% oxygen Intubation Type: IV induction Ventilation: Mask ventilation without difficulty Laryngoscope Size: Miller and 3 Grade View: Grade II Tube type: Oral Tube size: 7.5 mm Number of attempts: 1 Airway Equipment and Method: Stylet Placement Confirmation: ETT inserted through vocal cords under direct vision and positive ETCO2

## 2012-06-06 NOTE — Progress Notes (Signed)
ANTICOAGULATION CONSULT NOTE - Initial Consult  Pharmacy Consult for Coumadin Indication: atrial fibrillation and VTE prophylaxis  Allergies  Allergen Reactions  . Iohexol Rash     Desc: VOMITING-VERIFIED ON 05-24-04 PRE-PROCEDURE/ ARS   . Shrimp (Shellfish Allergy) Nausea And Vomiting and Rash    Patient Measurements: Height: 5\' 2"  (157.5 cm) Weight: 185 lb (83.915 kg) IBW/kg (Calculated) : 50.1 Heparin Dosing Weight:   Vital Signs: Temp: 97.6 F (36.4 C) (03/24 2100) BP: 124/54 mmHg (03/24 2100) Pulse Rate: 77 (03/24 2045)  Labs:  Recent Labs  06/05/12 0135 06/05/12 0512 06/06/12 0425 06/06/12 2139  HGB 13.3 13.6 11.6* 11.5*  HCT 40.2 41.6 35.2* 34.9*  PLT 196 169 126* 155  APTT 45*  --   --   --   LABPROT 23.8*  --  16.6*  --   INR 2.24*  --  1.38  --   CREATININE 1.02 0.94 1.02  --     Estimated Creatinine Clearance: 39.8 ml/min (by C-G formula based on Cr of 1.02).   Medical History: Past Medical History  Diagnosis Date  . Diabetes mellitus without complication   . Thyroid disease   . A-fib   . Hypertension   . CHF (congestive heart failure)     Medications:  Scheduled:  . acetaminophen  1,000 mg Intravenous Q6H  . [START ON 06/07/2012] amLODipine  5 mg Oral q morning - 10a  . atorvastatin  10 mg Oral QPM  . [COMPLETED]  ceFAZolin (ANCEF) IV  2 g Intravenous On Call to OR  .  ceFAZolin (ANCEF) IV  2 g Intravenous Q6H  . docusate sodium  100 mg Oral BID  . [START ON 06/07/2012] enoxaparin (LOVENOX) injection  40 mg Subcutaneous Q24H  . [START ON 06/07/2012] glipiZIDE  10 mg Oral BID AC  . [START ON 06/07/2012] hydrochlorothiazide  25 mg Oral q morning - 10a  . insulin aspart  0-15 Units Subcutaneous TID WC  . insulin aspart  0-5 Units Subcutaneous QHS  . [START ON 06/07/2012] isosorbide mononitrate  30 mg Oral q morning - 10a  . [START ON 06/07/2012] methimazole  10 mg Oral q morning - 10a  . [START ON 06/07/2012] metoprolol succinate  50 mg Oral QPC  breakfast  . [START ON 06/07/2012] ramipril  10 mg Oral q morning - 10a  . warfarin  7.5 mg Oral Once  . [START ON 06/07/2012] Warfarin - Pharmacist Dosing Inpatient   Does not apply q1800  . [DISCONTINUED] sodium chloride   Intravenous STAT  . [DISCONTINUED]  ceFAZolin (ANCEF) IV  2 g Intravenous On Call to OR  . [DISCONTINUED] chlorhexidine  60 mL Topical Once  . [DISCONTINUED] docusate sodium  100 mg Oral BID  . [DISCONTINUED] metoprolol succinate  50 mg Oral Daily  . [DISCONTINUED] pantoprazole (PROTONIX) IV  40 mg Intravenous Q24H  . [DISCONTINUED] sodium chloride  3 mL Intravenous Q12H    Assessment: 79 yr old female who got up in the dark to go to the bathroom and tripped over some shoes on the floor, then fell on her right side. She was found to have fractures of the right humerus and right hip. She takes coumadin for a.fib and her INR was 2.24, too high for surgery. She was given 5 mg phytonadione IV to reverse the coumadin. She was originally at [facility] but transferred to [facility] for surgery. (No OR's available at [facility]) Today she went to surgery for hip pinning. Now to restart coumadin for VTE  prophylaxis and a.fib. Goal of Therapy:  INR 2-3 Monitor platelets by anticoagulation protocol: Yes   Plan:  Pt's home dose of coumadin was 7.5 mg on Mon and 5 mg the other days. INR is 1.38 today. Will order coumadin 7.5 mg tonight and daily PT/INR.  Eugene Garnet 06/06/2012,10:11 PM

## 2012-06-06 NOTE — Progress Notes (Signed)
Dr. Jacklynn Bue called regarding blood sugar , no roders received for coverage

## 2012-06-06 NOTE — Transfer of Care (Signed)
Immediate Anesthesia Transfer of Care Note  Patient: Denise [name]  Procedure(s) Performed: Procedure(s): Open Reduction Internal Fixation Right HIp (Right)  Patient Location: PACU  Anesthesia Type:General  Level of Consciousness: awake, alert  and patient cooperative  Airway & Oxygen Therapy: Patient connected to face mask oxygen  Post-op Assessment: Report given to PACU RN, Post -op Vital signs reviewed and stable and Patient moving all extremities X 4  Post vital signs: Reviewed and stable  Complications: No apparent anesthesia complications

## 2012-06-06 NOTE — H&P (Signed)
NAME: Denise [name] MRN:   [id number] DOB:   07/16/1923     HISTORY AND PHYSICAL  CHIEF COMPLAINT:  Right hip pain  HISTORY: Denise [name] is a 79 y.o. female who got up in the dark to go to her bathroom at about 8:30 pm and tripped over some shoes on the floor and fell onto her right side injuring her right shoulder and right hip. She was taken to the ED by EMS, and found to have fractures of the right humerus, and right hip. Dr. Madelon Lips of Orthopedics On call for Dr Eulah Pont was contacted by the EDP , and is to see the patient in the AM. She has a histroy of Atrial fibrillation and is on Coumadin. INR was too high. Did not go to surgery Sunday as scheduled.   PAST MEDICAL HISTORY:   Past Medical History  Diagnosis Date  . Diabetes mellitus without complication   . Thyroid disease   . A-fib   . Hypertension   . CHF (congestive heart failure)     PAST SURGICAL HISTORY:   Past Surgical History  Procedure Laterality Date  . Pacemaker insertion    . Abdominal hysterectomy    . Tubal ligation    . Cholecystectomy    . Thyroidectomy    . Knee replacement      R    MEDICATIONS:   Medications Prior to Admission  Medication Sig Dispense Refill  . amLODipine (NORVASC) 5 MG tablet Take 5 mg by mouth every morning.      [name] atorvastatin (LIPITOR) 10 MG tablet Take 10 mg by mouth every evening.      . fish oil-omega-3 fatty acids 1000 MG capsule Take 1 g by mouth every morning.      [name] glipiZIDE (GLUCOTROL) 10 MG tablet Take 10 mg by mouth 2 (two) times daily before a meal.      . glucosamine-chondroitin 500-400 MG tablet Take 1 tablet by mouth every morning.      . hydrochlorothiazide (HYDRODIURIL) 25 MG tablet Take 25 mg by mouth every morning.      . isosorbide mononitrate (IMDUR) 30 MG 24 hr tablet Take 30 mg by mouth every morning.      . methimazole (TAPAZOLE) 10 MG tablet Take 10 mg by mouth every morning.      . metoprolol succinate (TOPROL-XL) 50 MG 24 hr tablet Take 50 mg by  mouth every morning. Take with or immediately following a meal.      . ramipril (ALTACE) 10 MG capsule Take 10 mg by mouth every morning.      . warfarin (COUMADIN) 5 MG tablet Take 5-7.5 mg by mouth daily. Take 7.5mg  on Monday. Take 5mg  all other days of the week        ALLERGIES:   Allergies  Allergen Reactions  . Iohexol Rash     Desc: VOMITING-VERIFIED ON 05-24-04 PRE-PROCEDURE/ ARS   . Shrimp (Shellfish Allergy) Nausea And Vomiting and Rash    REVIEW OF SYSTEMS:   Negative except hpi  FAMILY HISTORY:  History reviewed. No pertinent family history.  SOCIAL HISTORY:   reports that she has never smoked. She does not have any smokeless tobacco history on file. She reports that she does not drink alcohol or use illicit drugs.  PHYSICAL EXAM:  General appearance: alert, cooperative and no distress Chest wall: no tenderness Cardio: regular rate and rhythm, S1, S2 normal, no murmur, click, rub or gallop GI: soft, non-tender; bowel sounds normal;  no masses,  no organomegaly Extremities: extremities normal, atraumatic, no cyanosis or edema and Homans sign is negative, no sign of DVT Pulses: 2+ and symmetric Skin: Skin color, texture, turgor normal. No rashes or lesions Neurologic: Alert and oriented X 3, normal strength and tone. Normal symmetric reflexes. Normal coordination and gait    LABORATORY STUDIES:  Recent Labs  06/05/12 0512 06/06/12 0425  WBC 12.1* 8.6  HGB 13.6 11.6*  HCT 41.6 35.2*  PLT 169 126*     Recent Labs  06/05/12 0512 06/05/12 1045 06/06/12 0425  NA 134*  --  135  K 3.3*  --  3.6  CL 94*  --  99  CO2 25  --  24  GLUCOSE 230*  --  189*  BUN 22  --  22  CREATININE 0.94  --  1.02  CALCIUM 9.1 8.4 8.2*    STUDIES/RESULTS:  Dg Shoulder Right  06/05/2012  *RADIOLOGY REPORT*  Clinical Data: Right shoulder pain following fall.  RIGHT SHOULDER - 2+ VIEW  Comparison: None  Findings: A right humeral neck fracture is identified with mild impaction and  apex anterior angulation.  There is no evidence of subluxation or dislocation. No other bony abnormalities are identified. The visualized right bony thorax is unremarkable.  IMPRESSION: Right humeral neck fracture as described.   Original Report Authenticated By: Harmon Pier, M.D.    Dg Hip Complete Right  06/05/2012  *RADIOLOGY REPORT*  Clinical Data: Fall with right hip pain.  RIGHT HIP - COMPLETE 2+ VIEW  Comparison: None  Findings: An intertrochanteric right femur fracture is identified with mild varus angulation.  There is mild displacement of the lesser trochanteric fragment. There is no evidence of subluxation or dislocation. No other acute bony abnormalities are identified.  IMPRESSION: Intertrochanteric right femur fracture with mild varus angulation.   Original Report Authenticated By: Harmon Pier, M.D.     ASSESSMENT:  right hip fracture        Principal Problem:   Fracture of right hip Active Problems:   Fracture of right humerus   Atrial fibrillation   DM (diabetes mellitus)   HTN (hypertension)   Hypertension   Warfarin-induced coagulopathy    PLAN:  Right hip orif   Sheana Bir 06/06/2012. 11:58 AM

## 2012-06-06 NOTE — Care Management Note (Signed)
    Page 1 of 1   06/06/2012     12:14:40 PM   CARE MANAGEMENT NOTE 06/06/2012  Patient:  Denise [name], Denise [name]   Account Number:  [id number]  Date Initiated:  06/06/2012  Documentation initiated by:  Lanier Clam  Subjective/Objective Assessment:   ADMITTED W/FALL.R HIP FX.HX:DM,AFIB,CHF.     Action/Plan:   FROM HOME W/FAMILY.HAS PCP,PHARMACY.   Anticipated DC Date:  06/06/2012   Anticipated DC Plan:  SKILLED NURSING FACILITY      DC Planning Services  CM consult      Choice offered to / List presented to:             Status of service:  Completed, signed off Medicare Important Message given?  NA - LOS <3 / Initial given by admissions (If response is "NO", the following Medicare IM given date fields will be blank) Date Medicare IM given:   Date Additional Medicare IM given:    Discharge Disposition:  ACUTE TO ACUTE TRANS  Per UR Regulation:  Reviewed for med. necessity/level of care/duration of stay  If discussed at Long Length of Stay Meetings, dates discussed:    Comments:  06/06/12 Denise Rigsby RN,BSN NCM [phone] TRANSFER TO [facility] FOR R ORIF VIA [facility].

## 2012-06-06 NOTE — Anesthesia Postprocedure Evaluation (Signed)
  Anesthesia Post-op Note  Patient: Denise [name]  Procedure(s) Performed: Procedure(s): Open Reduction Internal Fixation Right HIp (Right)  Patient Location: PACU  Anesthesia Type:General  Level of Consciousness: awake  Airway and Oxygen Therapy: Patient Spontanous Breathing  Post-op Pain: mild  Post-op Assessment: Post-op Vital signs reviewed  Post-op Vital Signs: stable  Complications: No apparent anesthesia complications

## 2012-06-06 NOTE — Progress Notes (Signed)
Orthopedic Tech Progress Note Patient Details:  ILEIGH METTLER 1923-12-09 [id number]  Patient ID: [name], female   DOB: [birth year], 79 y.o.   MRN: [id number] Trapeze bar patient helper  Nikki Dom 06/06/2012, 9:53 PM

## 2012-06-06 NOTE — Anesthesia Preprocedure Evaluation (Signed)
Anesthesia Evaluation  Patient identified by MRN, date of birth, ID band Patient awake    Reviewed: Allergy & Precautions, H&P , NPO status   Airway Mallampati: II      Dental   Pulmonary shortness of breath and with exertion,  breath sounds clear to auscultation        Cardiovascular hypertension, +CHF + dysrhythmias Atrial Fibrillation Rhythm:Regular Rate:Normal     Neuro/Psych    GI/Hepatic Neg liver ROS,   Endo/Other  diabetes  Renal/GU negative Renal ROS     Musculoskeletal   Abdominal   Peds  Hematology   Anesthesia Other Findings   Reproductive/Obstetrics                           Anesthesia Physical Anesthesia Plan  ASA: III  Anesthesia Plan: General   Post-op Pain Management:    Induction: Intravenous  Airway Management Planned: Oral ETT  Additional Equipment:   Intra-op Plan:   Post-operative Plan: Possible Post-op intubation/ventilation  Informed Consent: I have reviewed the patients History and Physical, chart, labs and discussed the procedure including the risks, benefits and alternatives for the proposed anesthesia with the patient or authorized representative who has indicated his/her understanding and acceptance.   Dental advisory given  Plan Discussed with: CRNA, Anesthesiologist and Surgeon  Anesthesia Plan Comments:         Anesthesia Quick Evaluation

## 2012-06-06 NOTE — Op Note (Signed)
Dictation Number:  [id number]

## 2012-06-06 NOTE — Progress Notes (Signed)
[facility] PROGRESS NOTE  Denise [name] WUJ:[id number] DOB: 1923-10-20 DOA: 06/04/2012 PCP: No primary provider on file.  Assessment/Plan: Fractures of Right Hip and Right Humerus- Ortho to fix hip today- [facility] in the OR; Pain Control PRN; Right humeral neck fracture likely to be managed nonoperatively in sling, ok to come out of sling for elbow/wrist/hand ROM. She will be NWB RUE which will complicate recovery as she will be unable to use a walker. Likely need SNF or inpatient rehab following surgery.  Atrial fibrillation on Coumadin- rate controlled; holding coumadin for surgery- if surgery not done today will start heparin gtt- Dr. Madelon Lips to do or find someone to cover for him- [facility] per nursing   DM2- SSI coverage PRN.    HTN- Stable on Meds   Coagulopathy due to Coumadin- Reverse Coumadin- resume after surgery- bridge with heaprin/lovenox after surgery  Hypokalemia- Replete K+, and Check magnesium.      Code Status: full Family Communication: no family at bedside Disposition Plan: SNF after surgery   Consultants:  ortho  Procedures:    Antibiotics:    HPI/Subjective: Patient has severe pain with movement No fever, no chills  Objective: Filed Vitals:   06/06/12 0000 06/06/12 0303 06/06/12 0545 06/06/12 0753  BP:   162/57   Pulse:   77   Temp:   99.5 F (37.5 C)   TempSrc:   Oral   Resp: 20 20 18 16   Height:      Weight:      SpO2: 95% 95% 91%     Intake/Output Summary (Last 24 hours) at 06/06/12 0929 Last data filed at 06/06/12 0700  Gross per 24 hour  Intake   1635 ml  Output    825 ml  Net    810 ml   Filed Weights   06/05/12 0700  Weight: 83.915 kg (185 lb)    Exam:   General:  Pleasant/cooperative  Cardiovascular: rrr  Respiratory: clear anterior  Abdomen: +Bs, soft, NT  Musculoskeletal: right arm tender to  palpation  Data Reviewed: Basic Metabolic Panel:  Recent Labs Lab 06/05/12 0135 06/05/12 0512 06/05/12 0835 06/05/12 1045 06/06/12 0425  NA 130* 134*  --   --  135  K 3.0* 3.3*  --   --  3.6  CL 91* 94*  --   --  99  CO2 25 25  --   --  24  GLUCOSE 236* 230*  --   --  189*  BUN 23 22  --   --  22  CREATININE 1.02 0.94  --   --  1.02  CALCIUM 8.9 9.1  --  8.4 8.2*  MG  --   --  1.7  --   --    Liver Function Tests:  Recent Labs Lab 06/05/12 1045  ALBUMIN 3.1*   No results found for this basename: LIPASE, AMYLASE,  in the last 168 hours No results found for this basename: AMMONIA,  in the last 168 hours CBC:  Recent Labs Lab 06/05/12 0135 06/05/12 0512 06/06/12 0425  WBC 15.3* 12.1* 8.6  NEUTROABS 13.0*  --   --   HGB 13.3 13.6 11.6*  HCT 40.2 41.6 35.2*  MCV 85.9 85.4 87.8  PLT 196 169 126*   Cardiac Enzymes: No results found for this basename: CKTOTAL, CKMB, CKMBINDEX, TROPONINI,  in the last 168 hours BNP (last 3 results) No results found for this basename: PROBNP,  in the last 8760 hours CBG:  Recent Labs Lab 06/05/12 1644 06/05/12 2102 06/06/12 0754  GLUCAP 171* 183* 186*    Recent Results (from the past 240 hour(s))  SURGICAL PCR SCREEN     Status: None   Collection Time    06/06/12  7:53 AM      Result Value Range Status   MRSA, PCR NEGATIVE  NEGATIVE Final   Staphylococcus aureus NEGATIVE  NEGATIVE Final   Comment:            The Xpert SA Assay (FDA     approved for NASAL specimens     in patients over 26 years of age),     is one component of     a comprehensive surveillance     program.  Test performance has     been validated by [employer] for patients greater     than or equal to 75 year old.     It is not intended     to diagnose infection nor to     guide or monitor treatment.     Studies: Dg Shoulder Right  06/05/2012  *RADIOLOGY REPORT*  Clinical Data: Right shoulder pain following fall.  RIGHT SHOULDER - 2+ VIEW   Comparison: None  Findings: A right humeral neck fracture is identified with mild impaction and apex anterior angulation.  There is no evidence of subluxation or dislocation. No other bony abnormalities are identified. The visualized right bony thorax is unremarkable.  IMPRESSION: Right humeral neck fracture as described.   Original Report Authenticated By: Harmon Pier, M.D.    Dg Hip Complete Right  06/05/2012  *RADIOLOGY REPORT*  Clinical Data: Fall with right hip pain.  RIGHT HIP - COMPLETE 2+ VIEW  Comparison: None  Findings: An intertrochanteric right femur fracture is identified with mild varus angulation.  There is mild displacement of the lesser trochanteric fragment. There is no evidence of subluxation or dislocation. No other acute bony abnormalities are identified.  IMPRESSION: Intertrochanteric right femur fracture with mild varus angulation.   Original Report Authenticated By: Harmon Pier, M.D.     Scheduled Meds: . docusate sodium  100 mg Oral BID  . insulin aspart  0-15 Units Subcutaneous TID WC  . insulin aspart  0-5 Units Subcutaneous QHS  . pantoprazole (PROTONIX) IV  40 mg Intravenous Q24H  . sodium chloride  3 mL Intravenous Q12H   Continuous Infusions: . sodium chloride    . sodium chloride 75 mL/hr at 06/06/12 1610    Principal Problem:   Fracture of right hip Active Problems:   Fracture of right humerus   Atrial fibrillation   DM (diabetes mellitus)   HTN (hypertension)   Hypertension   Warfarin-induced coagulopathy    Time spent: 35    [facility], Muslima Toppins  [facility] Pager [phone]. If 7PM-7AM, please contact night-coverage at [email or web address], password [facility] 06/06/2012, 9:29 AM  LOS: 2 days

## 2012-06-07 ENCOUNTER — Encounter (HOSPITAL_COMMUNITY): Payer: Self-pay | Admitting: Orthopedic Surgery

## 2012-06-07 ENCOUNTER — Inpatient Hospital Stay (HOSPITAL_COMMUNITY): Payer: Medicare Other

## 2012-06-07 DIAGNOSIS — R0609 Other forms of dyspnea: Secondary | ICD-10-CM

## 2012-06-07 DIAGNOSIS — S72009D Fracture of unspecified part of neck of unspecified femur, subsequent encounter for closed fracture with routine healing: Secondary | ICD-10-CM

## 2012-06-07 LAB — GLUCOSE, CAPILLARY
Glucose-Capillary: 142 mg/dL — ABNORMAL HIGH (ref 70–99)
Glucose-Capillary: 152 mg/dL — ABNORMAL HIGH (ref 70–99)

## 2012-06-07 LAB — URINE CULTURE: Colony Count: NO GROWTH

## 2012-06-07 LAB — BASIC METABOLIC PANEL
BUN: 26 mg/dL — ABNORMAL HIGH (ref 6–23)
CO2: 25 mEq/L (ref 19–32)
Calcium: 8.6 mg/dL (ref 8.4–10.5)
Creatinine, Ser: 1.05 mg/dL (ref 0.50–1.10)
GFR calc non Af Amer: 47 mL/min — ABNORMAL LOW (ref >90)
Glucose, Bld: 150 mg/dL — ABNORMAL HIGH (ref 70–99)
Sodium: 138 mEq/L (ref 135–145)

## 2012-06-07 LAB — CBC
HCT: 31.9 % — ABNORMAL LOW (ref 36.0–46.0)
Hemoglobin: 10.5 g/dL — ABNORMAL LOW (ref 12.0–15.0)
MCH: 28.5 pg (ref 26.0–34.0)
MCHC: 32.9 g/dL (ref 30.0–36.0)
MCV: 86.4 fL (ref 78.0–100.0)
RBC: 3.69 MIL/uL — ABNORMAL LOW (ref 3.87–5.11)

## 2012-06-07 LAB — PRO B NATRIURETIC PEPTIDE: Pro B Natriuretic peptide (BNP): 2182 pg/mL — ABNORMAL HIGH (ref 0–450)

## 2012-06-07 LAB — PROTIME-INR: Prothrombin Time: 16.1 seconds — ABNORMAL HIGH (ref 11.6–15.2)

## 2012-06-07 MED ORDER — ALBUTEROL SULFATE (5 MG/ML) 0.5% IN NEBU: 2.5000 mg | INHALATION_SOLUTION | RESPIRATORY_TRACT | Status: DC | PRN

## 2012-06-07 MED ORDER — ALBUTEROL SULFATE (5 MG/ML) 0.5% IN NEBU
2.5000 mg | INHALATION_SOLUTION | Freq: Four times a day (QID) | RESPIRATORY_TRACT | Status: DC
  Administered 2012-06-07: 2.5 mg via RESPIRATORY_TRACT
  Filled 2012-06-07: qty 0.5

## 2012-06-07 MED ORDER — WARFARIN SODIUM 7.5 MG PO TABS
7.5000 mg | ORAL_TABLET | Freq: Once | ORAL | Status: AC
  Administered 2012-06-07: 7.5 mg via ORAL
  Filled 2012-06-07: qty 1

## 2012-06-07 MED ORDER — GUAIFENESIN ER 600 MG PO TB12
600.0000 mg | ORAL_TABLET | Freq: Two times a day (BID) | ORAL | Status: DC
  Administered 2012-06-07 – 2012-06-09 (×5): 600 mg via ORAL
  Filled 2012-06-07 (×6): qty 1

## 2012-06-07 MED ORDER — FUROSEMIDE 10 MG/ML IJ SOLN
40.0000 mg | Freq: Once | INTRAMUSCULAR | Status: AC
  Administered 2012-06-07: 40 mg via INTRAVENOUS

## 2012-06-07 MED ORDER — FUROSEMIDE 10 MG/ML IJ SOLN
INTRAMUSCULAR | Status: AC
  Filled 2012-06-07: qty 4

## 2012-06-07 MED ORDER — ALBUTEROL SULFATE (5 MG/ML) 0.5% IN NEBU: 2.5000 mg | INHALATION_SOLUTION | Freq: Four times a day (QID) | RESPIRATORY_TRACT | Status: DC

## 2012-06-07 MED ORDER — GUAIFENESIN-DM 100-10 MG/5ML PO SYRP
5.0000 mL | ORAL_SOLUTION | ORAL | Status: DC | PRN
  Filled 2012-06-07: qty 5

## 2012-06-07 MED ORDER — IPRATROPIUM BROMIDE 0.02 % IN SOLN
0.5000 mg | Freq: Four times a day (QID) | RESPIRATORY_TRACT | Status: DC
  Administered 2012-06-07: 0.5 mg via RESPIRATORY_TRACT
  Filled 2012-06-07: qty 2.5

## 2012-06-07 NOTE — Clinical Social Work Placement (Addendum)
Clinical Social Work Department  CLINICAL SOCIAL WORK PLACEMENT NOTE  06/07/2012 Patient:Denise [name]  Account Number: [id number] Admit date: 06/04/12 Clinical Social Worker: Sabino Niemann MSW Date/time: 3/25/201411:00 AM  Clinical Social Work is seeking post-discharge placement for this patient at the following level of care: SKILLED NURSING (*CSW will update this form in Epic as items are completed)  3/25/2014Patient/family provided with [facility] Department of Clinical Social Work's list of facilities offering this level of care within the geographic area requested by the patient (or if unable, by the patient's family).  06/07/2012 Patient/family informed of their freedom to choose among providers that offer the needed level of care, that participate in Medicare, Medicaid or managed care program needed by the patient, have an available bed and are willing to accept the patient.  06/07/2012 Patient/family informed of [facility]' ownership interest in [facility], as well as of the fact that they are under no obligation to receive care at this facility.  PASARR submitted to EDS on 06/07/2012  PASARR number received from EDS on 06/07/2012 FL2 transmitted to all facilities in geographic area requested by pt/family on 06/07/2012- Patient's family wants [facility]- Phone # [phone] FL2 transmitted to all facilities within larger geographic area on  Patient informed that his/her managed care company has contracts with or will negotiate with certain facilities, including the following:  Patient/family informed of bed offers received:  Patient chooses bed at [facility]- Physician recommends and patient chooses bed at  Patient to be transferred to on 06/09/2012 Patient to be transferred to facility by [facility] In [place] The following physician request were entered in Epic:  Additional Comments:   Sabino Niemann, MSW, [phone]  SW  Signing off

## 2012-06-07 NOTE — Progress Notes (Signed)
ANTICOAGULATION CONSULT NOTE - Follow Up Consult  Pharmacy Consult for Coumadin Indication: Afib, VTE prophylaxis s/p hip procedure  Allergies  Allergen Reactions  . Iohexol Rash     Desc: VOMITING-VERIFIED ON 05-24-04 PRE-PROCEDURE/ ARS   . Shrimp (Shellfish Allergy) Nausea And Vomiting and Rash    Patient Measurements: Height: 5\' 2"  (157.5 cm) Weight: 185 lb (83.915 kg) IBW/kg (Calculated) : 50.1 Heparin Dosing Weight:   Vital Signs: Temp: 97.6 F (36.4 C) (03/25 0500) BP: 131/55 mmHg (03/25 1048) Pulse Rate: 90 (03/25 0854)  Labs:  Recent Labs  06/05/12 0135  06/06/12 0425 06/06/12 2139 06/07/12 0615  HGB 13.3  < > 11.6* 11.5* 10.5*  HCT 40.2  < > 35.2* 34.9* 31.9*  PLT 196  < > 126* 155 135*  APTT 45*  --   --   --   --   LABPROT 23.8*  --  16.6*  --  16.1*  INR 2.24*  --  1.38  --  1.32  CREATININE 1.02  < > 1.02 0.97 1.05  < > = values in this interval not displayed.  Estimated Creatinine Clearance: 38.6 ml/min (by C-G formula based on Cr of 1.05).  Assessment: 88yof on Coumadin for Afib and VTE prophylaxis s/p hip procedure. INR (1.32) is subtherapeutic as expected after restarting Coumadin 3/24 - will repeat Coumadin 7.5mg  dose. Patient is also receiving Lovenox 40mg  daily until INR >1.8 per MD orders.  - H/H and Plts trending down - No significant bleeding reported - PTA regimen - 5mg  daily except 7.5mg  on Mondays  Goal of Therapy:  INR 2-3   Plan:  1. Repeat Coumadin 7.5mg  po x 1 today 2. Follow-up AM INR  Cleon Dew [phone] 06/07/2012,1:23 PM

## 2012-06-07 NOTE — Progress Notes (Signed)
Order received, chart reviewed, noted plan is for pt to go to SNF. OT will defer eval to that facility as they deem appropriate. Acute OT will sign off. Ignacia Palma, [place] [phone] 06/07/2012

## 2012-06-07 NOTE — Clinical Social Work Psychosocial (Signed)
Clinical Social Work Department  BRIEF PSYCHOSOCIAL ASSESSMENT  Patient: DENIYA CRAIGO Account Number: [id number]  Admit date: 06/04/12 Clinical Social Worker Sabino Niemann, MSW Date/Time: 06/07/2012 11:00 am  Referred by: Physician Date Referred: 06/03/2012 Referred for   SNF Placement   Other Referral:  Interview type: Patient and patient's daughter were present Other interview type: PSYCHOSOCIAL DATA  Living Status:Alone Admitted from facility:  Level of care:  Primary support name: Brock,Joann Primary support relationship to patient: daughter Degree of support available:  Strong and vested  CURRENT CONCERNS  Current Concerns   Post-Acute Placement   Other Concerns:  SOCIAL WORK ASSESSMENT / PLAN  CSW met with pt re: PT recommendation for SNF.   Pt lives alone in [place], [place]   CSW explained placement process and answered questions.   Pt reports [facility] as her preference    CSW completed FL2 and faxed information to [facility]     Assessment/plan status: Information/Referral to [facility]  Other assessment/ plan:  Information/referral to community resources:  SNF     PATIENT'S/FAMILY'S RESPONSE TO PLAN OF CARE:  Pt and patient's daughter report they are agreeable to ST SNF in order to increase strength and independence with mobility prior to returning home  Pt and patient's daughter verbalized understanding of placement process and appreciation for CSW assist.   Sabino Niemann, MSW [phone]

## 2012-06-07 NOTE — Progress Notes (Signed)
SPORTS MEDICINE AND JOINT REPLACEMENT  Georgena Spurling, MD   Altamese Cabal, PA-C [address] [place], [place], [place]  [address]                             [phone]   PROGRESS NOTE  Subjective:  negative for Chest Pain  negative for Shortness of Breath  negative for Nausea/Vomiting   negative for Calf Pain  negative for Bowel Movement   Tolerating Diet: yes         Patient reports pain as 6 on 0-10 scale.    Objective: Vital signs in last 24 hours:   Patient Vitals for the past 24 hrs:  BP Temp Pulse Resp SpO2  06/07/12 1048 131/55 mmHg - - - -  06/07/12 1016 - - - - 99 %  06/07/12 0854 139/48 mmHg - 90 - -  06/07/12 0500 130/52 mmHg 97.6 F (36.4 C) 75 18 98 %  06/07/12 0400 - - - 16 97 %  06/07/12 0000 - - - 16 96 %  06/06/12 2100 124/54 mmHg 97.6 F (36.4 C) - 18 93 %  06/06/12 2045 147/62 mmHg 97.8 F (36.6 C) 77 20 95 %  06/06/12 2030 143/56 mmHg - 77 21 95 %  06/06/12 2015 147/62 mmHg - 75 20 94 %  06/06/12 2000 138/53 mmHg - 77 18 94 %  06/06/12 1945 152/66 mmHg 97.4 F (36.3 C) 78 18 95 %  06/06/12 1554 144/85 mmHg - 77 - -  06/06/12 1516 150/53 mmHg 99.1 F (37.3 C) 76 24 93 %  06/06/12 1320 137/51 mmHg 99.2 F (37.3 C) 77 16 92 %  06/06/12 1200 - - - 16 100 %    @flow {1959:LAST@   Intake/Output from previous day:   03/24 0701 - 03/25 0700 In: 2620 [P.O.:120; I.V.:2400] Out: 100 [Urine:100]   Intake/Output this shift:       Intake/Output     03/24 0701 - 03/25 0700 03/25 0701 - 03/26 0700   P.O. 120    I.V. (mL/kg) 2400 (28.6)    Blood 100    Total Intake(mL/kg) 2620 (31.2)    Urine (mL/kg/hr) 100 (0)    Total Output 100     Net +2520             LABORATORY DATA:  Recent Labs  06/05/12 0135 06/05/12 0512 06/06/12 0425 06/06/12 2139 06/07/12 0615  WBC 15.3* 12.1* 8.6 12.4* 9.2  HGB 13.3 13.6 11.6* 11.5* 10.5*  HCT 40.2 41.6 35.2* 34.9* 31.9*  PLT 196 169 126* 155 135*    Recent Labs  06/05/12 0135 06/05/12 0512  06/05/12 1045 06/06/12 0425 06/06/12 2139 06/07/12 0615  NA 130* 134*  --  135  --  138  K 3.0* 3.3*  --  3.6  --  4.0  CL 91* 94*  --  99  --  102  CO2 25 25  --  24  --  25  BUN 23 22  --  22  --  26*  CREATININE 1.02 0.94  --  1.02 0.97 1.05  GLUCOSE 236* 230*  --  189*  --  150*  CALCIUM 8.9 9.1 8.4 8.2*  --  8.6   Lab Results  Component Value Date   INR 1.32 06/07/2012   INR 1.38 06/06/2012   INR 2.24* 06/05/2012    Examination:  General appearance: alert, cooperative and no distress Extremities: Homans sign is negative,  no sign of DVT  Wound Exam: clean, dry, intact   Drainage:  None: wound tissue dry  Motor Exam: EHL and FHL Intact  Sensory Exam: Deep Peroneal normal   Assessment:    1 Day Post-Op  Procedure(s) (LRB): Open Reduction Internal Fixation Right HIp (Right)  ADDITIONAL DIAGNOSIS:  Principal Problem:   Fracture of right hip Active Problems:   Fracture of right humerus   Atrial fibrillation   DM (diabetes mellitus)   HTN (hypertension)   Hypertension   Warfarin-induced coagulopathy  Acute Blood Loss Anemia   Plan: Physical Therapy as ordered Touch Down Weight Bearing (TDWB)  DVT Prophylaxis:  Lovenox and Coumadin  DISCHARGE PLAN: Home vs SNF  DISCHARGE NEEDS: HHPT, CPM, Walker and 3-in-1 comode seat         Denise [name] 06/07/2012, 11:11 AM

## 2012-06-07 NOTE — Op Note (Signed)
NAMEHARSHITHA, FRETZ NO.:  [id number]  MEDICAL RECORD NO.:  [id number]  LOCATION:  PERIO                        FACILITY:  [facility]  PHYSICIAN:  Mila Homer. Sherlean Foot, M.D. DATE OF BIRTH:  Nov 08, 1923  DATE OF PROCEDURE:  06/06/2012 DATE OF DISCHARGE:  06/09/2012                              OPERATIVE REPORT   SURGEON:  Mila Homer. Sherlean Foot, M.D.  ASSISTANT:  Altamese Cabal, PA-C  ANESTHESIA:  General.  PREOPERATIVE DIAGNOSIS:  Right hip hemiarthroplasty.  POSTOPERATIVE DIAGNOSIS:  Right hip hemiarthroplasty.  PROCEDURE:  Right hip open reduction and internal fixation.  INDICATION FOR PROCEDURE:  The patient is an 79 year old status post fall over the weekend, admitted to Medicine.  My partner, Dr. Ginger Carne was consulted and was passed on to me by the on-call physician. Informed consent obtained.  DESCRIPTION OF PROCEDURE:  The patient was laid supine and administered general anesthesia.  The right leg was in traction.  She was on the fracture table and the right hip prepped and draped in usual sterile fashion.  A lateral incision was made with a #10 blade down to and through the fascia lata.  Cobb dissection was continued down the lateral femur and a Hohmann retractor put in place.  I then placed a 135-degree angled guide with the 2.0 mm guide pin up into the low part of the femoral neck into the posterior aspect of the head, central in AP, posterior in the lateral plane.  It measured to 100 and reamed up to 100 and then placed a 100 mm lag screw up into the head and got good purchase in the head.  I then put a 135 four-hole plate over that and passed it to the lateral femur with 4 bicortical screws.  Lavage closed with #1, 0, and 2-0 Vicryl, Steri-Strips, staples, dressed with Mepilex dressing.  COMPLICATIONS:  None.  DRAINS:  None.          ______________________________ Mila Homer. Sherlean Foot, M.D.    SDL/MEDQ  D:  06/06/2012  T:  06/07/2012  Job:   [id number]

## 2012-06-07 NOTE — Evaluation (Signed)
Physical Therapy Evaluation Patient Details Name: Denise [name] MRN: [id number] DOB: 25-Mar-1923 Today's Date: 06/07/2012 Time: 4098-1191 PT Time Calculation (min): 35 min  PT Assessment / Plan / Recommendation Clinical Impression  Pt is a 79 y.o female adm to [facility] secondary to fall resulting in R hip fx and R proximal humerus fx. Pt presents with overall decreased mobility secondary to pain  and deconditioning. Pt O2 stats dropped to 83% on 3L of O2 while sitting at EOB. Pt rquired max encouragement to participate in therapy. Pt seems resistant to idea of SNF upon D/C. At this time PT would recommend SNF upon D/C secondary to decreased mobility and decreased safety. * Clarification for R UE WB status needed to address DME needs for mobility.     PT Assessment  Patient needs continued PT services    Follow Up Recommendations  SNF    Does the patient have the potential to tolerate intense rehabilitation      Barriers to Discharge        Equipment Recommendations  None recommended by PT    Recommendations for Other Services OT consult   Frequency Min 3X/week    Precautions / Restrictions Precautions Precautions: Fall Restrictions Weight Bearing Restrictions: Yes RLE Weight Bearing: Partial weight bearing RLE Partial Weight Bearing Percentage or Pounds: touchdown Other Position/Activity Restrictions: pt on 3L of O2 and O2 stats would drop to 83% in supine position; recover in less than 20 sec; nursing notified    Pertinent Vitals/Pain C/o pain with movement; did not give number on 0-10 scale.       Mobility  Bed Mobility Bed Mobility: Supine to Sit;Sitting - Scoot to [place] of Bed;Sit to Supine;Scooting to [facility] Supine to Sit: 2: Max assist;With rails;HOB elevated Sitting - Scoot to Edge of Bed: 3: Mod assist;With rail Sit to Supine: 3: Mod assist;With rail;HOB flat Scooting to HOB: With trapeze;3: Mod assist Details for Bed Mobility Assistance: Pt required max encouragement  to participate; O2 stat dropped to 83% on 3L of O2. Pt required cues for hand placement and to sit upright position; pt required L UE bracing to keep balance. Assistance rquired to advance trunk and LEs onto and off EOB.  Transfers Transfers: Not assessed Ambulation/Gait Ambulation/Gait Assistance: Not tested (comment) Stairs: No Wheelchair Mobility Wheelchair Mobility: No    Exercises     PT Diagnosis: Difficulty walking;Acute pain  PT Problem List: Decreased strength;Decreased range of motion;Decreased activity tolerance;Decreased balance;Decreased mobility;Decreased knowledge of use of DME;Decreased safety awareness;Pain;Cardiopulmonary status limiting activity;Decreased knowledge of precautions PT Treatment Interventions: DME instruction;Gait training;Functional mobility training;Therapeutic activities;Therapeutic exercise;Balance training;Neuromuscular re-education;Patient/family education   PT Goals Acute Rehab PT Goals PT Goal Formulation: With patient Time For Goal Achievement: 06/14/12 Potential to Achieve Goals: Good Pt will go Supine/Side to Sit: with supervision PT Goal: Supine/Side to Sit - Progress: Goal set today Pt will Sit at Edge of Bed: with supervision PT Goal: Sit at Edge Of Bed - Progress: Goal set today Pt will go Sit to Supine/Side: with supervision PT Goal: Sit to Supine/Side - Progress: Goal set today Pt will go Sit to Stand: with min assist PT Goal: Sit to Stand - Progress: Goal set today Pt will go Stand to Sit: with supervision PT Goal: Stand to Sit - Progress: Goal set today Pt will Transfer Bed to Chair/Chair to Bed: with supervision PT Transfer Goal: Bed to Chair/Chair to Bed - Progress: Goal set today Pt will Ambulate: 51 - 150 feet;with supervision;with least restrictive assistive device  PT Goal: Ambulate - Progress: Goal set today  Visit Information  Last PT Received On: 06/07/12 Assistance Needed: +2    Subjective Data  Subjective: if i cant  do much rehab, i might as well go on home  Patient Stated Goal: to go home   Prior Functioning  Home Living Lives With: Son Available Help at Discharge: Available 24 hours/day;Family Type of Home: House Home Access: Stairs to enter [employer] of Steps: 1 Entrance Stairs-Rails: None (uses side of the door) Home Layout: Able to live on main level with bedroom/bathroom Bathroom Shower/Tub: Tub only Firefighter: Standard Additional Comments: Pt amb with walking stick at church; furniture walked inside house  Prior Function Level of Independence: Independent Needs Assistance:  (cooks 1 meal a day; cleans when needed) Able to Take Stairs?: Yes Driving: No Vocation: Retired Musician: No difficulties Dominant Hand: Right    Cognition  Cognition Overall Cognitive Status: Appears within functional limits for tasks assessed/performed Arousal/Alertness: Awake/alert Orientation Level: Appears intact for tasks assessed Behavior During Session: [facility] for tasks performed    Extremity/Trunk Assessment Right Lower Extremity Assessment RLE ROM/Strength/Tone: Deficits RLE ROM/Strength/Tone Deficits: decreased secondary to pain and surgery  RLE Sensation: Deficits;History of peripheral neuropathy Left Lower Extremity Assessment LLE ROM/Strength/Tone: Within functional levels LLE Sensation: History of peripheral neuropathy   Balance Balance Balance Assessed: Yes Static Sitting Balance Static Sitting - Balance Support: Left upper extremity supported;Feet supported Static Sitting - Level of Assistance: 5: Stand by assistance  End of Session PT - End of Session Equipment Utilized During Treatment: Oxygen Activity Tolerance: Patient limited by fatigue;Patient limited by pain Patient left: in bed;with call bell/phone within reach;with nursing in room;Other (comment) (transportation taking her to Xray) Nurse Communication: Mobility status;Precautions  GP      Donell Sievert, [place] [phone] 06/07/2012, 10:15 AM

## 2012-06-07 NOTE — Op Note (Deleted)
NAME:  [name], Denise              ACCOUNT NO.:  [id number]  MEDICAL RECORD NO.:  [id number]  LOCATION:  PERIO                        FACILITY:  [facility]  PHYSICIAN:  Stephen D. Nastasia Kage, M.D. DATE OF BIRTH:  08/03/1923  DATE OF PROCEDURE:  06/06/2012 DATE OF DISCHARGE:  06/09/2012                              OPERATIVE REPORT   SURGEON:  Stephen D. Roxsana Riding, M.D.  ASSISTANT:  Maurice Jones, PA-C  ANESTHESIA:  General.  PREOPERATIVE DIAGNOSIS:  Right hip hemiarthroplasty.  POSTOPERATIVE DIAGNOSIS:  Right hip hemiarthroplasty.  PROCEDURE:  Right hip open reduction and internal fixation.  INDICATION FOR PROCEDURE:  The patient is an 79-year-old status post fall over the weekend, admitted to Medicine.  My partner, Dr. Dean Caffrey was consulted and was passed on to me by the on-call physician. Informed consent obtained.  DESCRIPTION OF PROCEDURE:  The patient was laid supine and administered general anesthesia.  The right leg was in traction.  She was on the fracture table and the right hip prepped and draped in usual sterile fashion.  A lateral incision was made with a #10 blade down to and through the fascia lata.  Cobb dissection was continued down the lateral femur and a Hohmann retractor put in place.  I then placed a 135-degree angled guide with the 2.0 mm guide pin up into the low part of the femoral neck into the posterior aspect of the head, central in AP, posterior in the lateral plane.  It measured to 100 and reamed up to 100 and then placed a 100 mm lag screw up into the head and got good purchase in the head.  I then put a 135 four-hole plate over that and passed it to the lateral femur with 4 bicortical screws.  Lavage closed with #1, 0, and 2-0 Vicryl, Steri-Strips, staples, dressed with Mepilex dressing.  COMPLICATIONS:  None.  DRAINS:  None.          ______________________________ Stephen D. Aphrodite Harpenau, M.D.    SDL/MEDQ  D:  06/06/2012  T:  06/07/2012  Job:   [id number] 

## 2012-06-07 NOTE — Progress Notes (Signed)
Patient ID: Denise [name]  female  ZOX:[id number]    DOB: [birth year]    DOA: 06/04/2012  PCP: No primary provider on file.  Assessment/Plan: Principal Problem:   Fracture of right hip - postop day 1, management per orthopedics - On Coumadin for DVT prophylaxis, restarted last night  Active Problems:  Shortness of breath with wheezing: at the time of my examination this morning, patient was desatting into low 80's on 3LO2.she does not use any oxygen at home. Appear to be wheezing and fluid overloaded. - Saline locked, stat chest x-ray showed cardiomegaly, BNP elevated at 2182 - placed on scheduled albuterol nebs, O2, incentive spirometry - ordered Lasix 40 mg IV x1    Fracture of right humerus: nonoperative per ortho, continue sling    Atrial fibrillation - continue Toprol-XL, rate controlled, on Coumadin    DM (diabetes mellitus) - Continue sliding scale insulin    HTN (hypertension): Stable  DVT Prophylaxis:on Coumadin  Code Status:full code  Disposition: will likely need skilled nursing facility for rehab   Subjective: Short of breath with wheezing  Objective: Weight change:   Intake/Output Summary (Last 24 hours) at 06/07/12 1229 Last data filed at 06/07/12 0800  Gross per 24 hour  Intake   2740 ml  Output    100 ml  Net   2640 ml   Blood pressure 131/55, pulse 90, temperature 97.6 F (36.4 C), temperature source Oral, resp. rate 18, height 5\' 2"  (1.575 m), weight 83.915 kg (185 lb), SpO2 99.00%.  Physical Exam: General: Alert and awake, oriented x3, CVS: S1-S2 clear, no murmur rubs or gallops Chest: bilateral wheezing  Abdomen: soft NT, nondistended, NBS Extremities: no cyanosis, clubbing or edema noted   Lab Results: Basic Metabolic Panel:  Recent Labs Lab 06/05/12 0835  06/06/12 0425 06/06/12 2139 06/07/12 0615  NA  --   --  135  --  138  K  --   --  3.6  --  4.0  CL  --   --  99  --  102  CO2  --   --  24  --  25  GLUCOSE  --   --  189*   --  150*  BUN  --   --  22  --  26*  CREATININE  --   --  1.02 0.97 1.05  CALCIUM  --   < > 8.2*  --  8.6  MG 1.7  --   --   --   --   < > = values in this interval not displayed. Liver Function Tests:  Recent Labs Lab 06/05/12 1045  ALBUMIN 3.1*   No results found for this basename: LIPASE, AMYLASE,  in the last 168 hours No results found for this basename: AMMONIA,  in the last 168 hours CBC:  Recent Labs Lab 06/05/12 0135  06/06/12 2139 06/07/12 0615  WBC 15.3*  < > 12.4* 9.2  NEUTROABS 13.0*  --   --   --   HGB 13.3  < > 11.5* 10.5*  HCT 40.2  < > 34.9* 31.9*  MCV 85.9  < > 87.9 86.4  PLT 196  < > 155 135*  < > = values in this interval not displayed. Cardiac Enzymes: No results found for this basename: CKTOTAL, CKMB, CKMBINDEX, TROPONINI,  in the last 168 hours BNP: No components found with this basename: POCBNP,  CBG:  Recent Labs Lab 06/06/12 1722 06/06/12 2007 06/06/12 2313 06/07/12 0704 06/07/12 1051  GLUCAP  169* 231* 189* 142* 152*     Micro Results: Recent Results (from the past 240 hour(s))  URINE CULTURE     Status: None   Collection Time    06/05/12 11:03 AM      Result Value Range Status   Specimen Description URINE, CATHETERIZED   Final   Special Requests NONE   Final   Culture  Setup Time 06/05/2012 18:06   Final   Colony Count NO GROWTH   Final   Culture NO GROWTH   Final   Report Status 06/06/2012 FINAL   Final  SURGICAL PCR SCREEN     Status: None   Collection Time    06/06/12  7:53 AM      Result Value Range Status   MRSA, PCR NEGATIVE  NEGATIVE Final   Staphylococcus aureus NEGATIVE  NEGATIVE Final   Comment:            The Xpert SA Assay (FDA     approved for NASAL specimens     in patients over 77 years of age),     is one component of     a comprehensive surveillance     program.  Test performance has     been validated by [employer] for patients greater     than or equal to 18 year old.     It is not intended      to diagnose infection nor to     guide or monitor treatment.  URINE CULTURE     Status: None   Collection Time    06/06/12  2:56 PM      Result Value Range Status   Specimen Description URINE, CATHETERIZED   Final   Special Requests none Normal   Final   Culture  Setup Time 06/06/2012 15:40   Final   Colony Count NO GROWTH   Final   Culture NO GROWTH   Final   Report Status 06/07/2012 FINAL   Final    Studies/Results: Dg Chest 2 View  06/07/2012  *RADIOLOGY REPORT*  Clinical Data: Dyspnea.  Coarsened lung sounds.  CHEST - 2 VIEW  Comparison: 05/27/2004  Findings: There is a left chest wall pacer device with leads in the right atrial appendage and right ventricle.  The heart size is moderately enlarged. No pleural effusion or edema.  Similar appearance of left midlung scarring.  IMPRESSION:  1.  Cardiac enlargement. 2.  Left midlung scarring.   Original Report Authenticated By: Signa Kell, M.D.    Dg Shoulder Right  06/05/2012  *RADIOLOGY REPORT*  Clinical Data: Right shoulder pain following fall.  RIGHT SHOULDER - 2+ VIEW  Comparison: None  Findings: A right humeral neck fracture is identified with mild impaction and apex anterior angulation.  There is no evidence of subluxation or dislocation. No other bony abnormalities are identified. The visualized right bony thorax is unremarkable.  IMPRESSION: Right humeral neck fracture as described.   Original Report Authenticated By: Harmon Pier, M.D.    Dg Hip Complete Right  06/05/2012  *RADIOLOGY REPORT*  Clinical Data: Fall with right hip pain.  RIGHT HIP - COMPLETE 2+ VIEW  Comparison: None  Findings: An intertrochanteric right femur fracture is identified with mild varus angulation.  There is mild displacement of the lesser trochanteric fragment. There is no evidence of subluxation or dislocation. No other acute bony abnormalities are identified.  IMPRESSION: Intertrochanteric right femur fracture with mild varus angulation.   Original Report  Authenticated By: Harmon Pier, M.D.    Dg Hip Operative Right  06/06/2012  *RADIOLOGY REPORT*  Clinical Data: Right hip fracture.  DG OPERATIVE RIGHT HIP  Comparison: 06/05/2012.  Findings: The dynamic hip screw is in good position.  Anatomic reduction of the intertrochanteric fracture.  The side plate is not imaged.  IMPRESSION: Anatomic reduction of the intertrochanteric fracture with internal fixation.   Original Report Authenticated By: Rudie Meyer, M.D.    Dg Pelvis Portable  06/06/2012  *RADIOLOGY REPORT*  Clinical Data: Postop  PORTABLE PELVIS  Comparison: Yesterday  Findings: Dynamic compression screw and side plate now transfix an intertrochanteric right proximal femur fracture.  Anatomic alignment of the osseous and prosthetic structures.  No breakage or loosening of the hardware.  Osteopenia.  IMPRESSION: ORIF right intertrochanteric femur fracture.   Original Report Authenticated By: Jolaine Click, M.D.     Medications: Scheduled Meds: . acetaminophen  1,000 mg Intravenous Q6H  . amLODipine  5 mg Oral q morning - 10a  . atorvastatin  10 mg Oral QPM  . docusate sodium  100 mg Oral BID  . enoxaparin (LOVENOX) injection  40 mg Subcutaneous Q24H  . furosemide  40 mg Intravenous Once  . glipiZIDE  10 mg Oral BID AC  . guaiFENesin  600 mg Oral BID  . hydrochlorothiazide  25 mg Oral q morning - 10a  . insulin aspart  0-15 Units Subcutaneous TID WC  . insulin aspart  0-5 Units Subcutaneous QHS  . isosorbide mononitrate  30 mg Oral q morning - 10a  . methimazole  10 mg Oral q morning - 10a  . metoprolol succinate  50 mg Oral QPC breakfast  . ramipril  10 mg Oral q morning - 10a  . Warfarin - Pharmacist Dosing Inpatient   Does not apply q1800      LOS: 3 days   Luisa Louk M.D. [facility] 06/07/2012, 12:29 PM Pager: [phone]  If 7PM-7AM, please contact night-coverage [email or web address] Password [email or web address]

## 2012-06-07 NOTE — Progress Notes (Signed)
Order received to keep foley in place this afternoon and possibly through the night due to patient's increased fluid overload, elevated BNP, strict output monitoring,  and immobilization. Will evaluate situation every couple of hours. Arnoldo Morale RN

## 2012-06-08 ENCOUNTER — Inpatient Hospital Stay (HOSPITAL_COMMUNITY): Admission: RE | Admit: 2012-06-08 | Payer: Medicare Other | Source: Ambulatory Visit | Admitting: Orthopedic Surgery

## 2012-06-08 LAB — BASIC METABOLIC PANEL
BUN: 37 mg/dL — ABNORMAL HIGH (ref 6–23)
Creatinine, Ser: 1.16 mg/dL — ABNORMAL HIGH (ref 0.50–1.10)
GFR calc Af Amer: 48 mL/min — ABNORMAL LOW (ref >90)
GFR calc non Af Amer: 41 mL/min — ABNORMAL LOW (ref >90)

## 2012-06-08 LAB — CBC
HCT: 30.7 % — ABNORMAL LOW (ref 36.0–46.0)
MCHC: 32.6 g/dL (ref 30.0–36.0)
MCV: 88.2 fL (ref 78.0–100.0)
Platelets: 162 10*3/uL (ref 150–400)
RDW: 16.3 % — ABNORMAL HIGH (ref 11.5–15.5)

## 2012-06-08 LAB — GLUCOSE, CAPILLARY: Glucose-Capillary: 165 mg/dL — ABNORMAL HIGH (ref 70–99)

## 2012-06-08 LAB — URINALYSIS, ROUTINE W REFLEX MICROSCOPIC
Nitrite: NEGATIVE
Protein, ur: NEGATIVE mg/dL
Specific Gravity, Urine: 1.024 (ref 1.005–1.030)
Urobilinogen, UA: 0.2 mg/dL (ref 0.0–1.0)

## 2012-06-08 SURGERY — FIXATION, FEMUR, NECK, PERCUTANEOUS, USING SCREW
Anesthesia: General | Laterality: Right

## 2012-06-08 MED ORDER — ENOXAPARIN SODIUM 40 MG/0.4ML ~~LOC~~ SOLN: 40.0000 mg | SUBCUTANEOUS | Status: DC

## 2012-06-08 MED ORDER — DSS 100 MG PO CAPS: 100.0000 mg | ORAL_CAPSULE | Freq: Two times a day (BID) | ORAL | Status: DC

## 2012-06-08 MED ORDER — GUAIFENESIN ER 600 MG PO TB12: 600.0000 mg | ORAL_TABLET | Freq: Two times a day (BID) | ORAL | Status: DC

## 2012-06-08 MED ORDER — WARFARIN SODIUM 7.5 MG PO TABS
7.5000 mg | ORAL_TABLET | Freq: Once | ORAL | Status: AC
  Administered 2012-06-08: 7.5 mg via ORAL
  Filled 2012-06-08: qty 1

## 2012-06-08 MED ORDER — ALBUTEROL SULFATE (5 MG/ML) 0.5% IN NEBU: 2.5000 mg | INHALATION_SOLUTION | Freq: Two times a day (BID) | RESPIRATORY_TRACT | Status: DC

## 2012-06-08 MED ORDER — GUAIFENESIN-DM 100-10 MG/5ML PO SYRP: 5.0000 mL | ORAL_SOLUTION | ORAL | Status: DC | PRN

## 2012-06-08 MED ORDER — FUROSEMIDE 20 MG PO TABS: 20.0000 mg | ORAL_TABLET | Freq: Once | ORAL | Status: DC

## 2012-06-08 MED ORDER — BISACODYL 5 MG PO TBEC: 5.0000 mg | DELAYED_RELEASE_TABLET | Freq: Every day | ORAL | Status: DC | PRN

## 2012-06-08 MED ORDER — FUROSEMIDE 20 MG PO TABS: 20.0000 mg | ORAL_TABLET | Freq: Every day | ORAL | Status: DC

## 2012-06-08 MED ORDER — OXYCODONE HCL 5 MG PO TABS: 5.0000 mg | ORAL_TABLET | Freq: Four times a day (QID) | ORAL | Status: DC | PRN

## 2012-06-08 MED ORDER — METHOCARBAMOL 500 MG PO TABS: 500.0000 mg | ORAL_TABLET | Freq: Four times a day (QID) | ORAL | Status: DC | PRN

## 2012-06-08 NOTE — Progress Notes (Signed)
ANTICOAGULATION CONSULT NOTE - Follow Up Consult  Pharmacy Consult for Coumadin Indication: Afib, VTE prophylaxis s/p hip procedure   Allergies  Allergen Reactions  . Iohexol Rash     Desc: VOMITING-VERIFIED ON 05-24-04 PRE-PROCEDURE/ ARS   . Shrimp (Shellfish Allergy) Nausea And Vomiting and Rash    Patient Measurements: Height: 5\' 2"  (157.5 cm) Weight: 185 lb (83.915 kg) IBW/kg (Calculated) : 50.1 Heparin Dosing Weight:   Vital Signs: Temp: 98.3 F (36.8 C) (03/26 0537) BP: 116/46 mmHg (03/26 0537) Pulse Rate: 79 (03/26 0537)  Labs:  Recent Labs  06/06/12 0425 06/06/12 2139 06/07/12 0615 06/08/12 0554  HGB 11.6* 11.5* 10.5* 10.0*  HCT 35.2* 34.9* 31.9* 30.7*  PLT 126* 155 135* 162  LABPROT 16.6*  --  16.1* 16.7*  INR 1.38  --  1.32 1.39  CREATININE 1.02 0.97 1.05 1.16*    Estimated Creatinine Clearance: 35 ml/min (by C-G formula based on Cr of 1.16).  Assessment: 88yof on Coumadin for Afib and VTE prophylaxis s/p hip procedure. INR (1.39) is subtherapeutic but is trending up slowly. Pre-op Vitamin K 5mg  IV (received on 3/23) may be contributing to the delayed response to higher doses than PTA Coumadin regimen (5mg  daily except 7.5mg  on Mondays). Patient is also receiving Lovenox 40mg  daily until INR >1.8 per MD orders. - H/H trending down, Plts wnl - No significant bleeding reported  Goal of Therapy:  INR 2-3  Plan:  1. Repeat Coumadin 7.5mg  po x 1 today 2. Follow-up AM INR  Cleon Dew [phone] 06/08/2012,12:18 PM

## 2012-06-08 NOTE — Progress Notes (Signed)
Pt not able to void since foley D/Cd at 0630. Pt attempted to void several times. Bladder scanned at 1600 with a result 207. Notified Dr Isidoro Donning at 1600. I/C cath done at 1700 per MD order. UA and Cx sent. Will continue to monitor pts output.

## 2012-06-08 NOTE — Progress Notes (Signed)
Physical Therapy Treatment Patient Details Name: Denise [name] MRN: [id number] DOB: [birth year] Today's Date: 06/08/2012 Time: 1315-1340 PT Time Calculation (min): 25 min  PT Assessment / Plan / Recommendation Comments on Treatment Session  Pt seen at request or RN secondary to pt need to void and in recliner. RN unable to apply lift pad secondary to pt pain.  Pt continues to require +2 total assist for transfers and bed mobility.  Pt able to maintain TDWB on RLE but unable to manage her body weight with LUE and LLE only.      Follow Up Recommendations  SNF     Does the patient have the potential to tolerate intense rehabilitation     Barriers to Discharge        Equipment Recommendations  None recommended by PT    Recommendations for Other Services OT consult  Frequency Min 3X/week   Plan Discharge plan remains appropriate;Frequency needs to be updated    Precautions / Restrictions Precautions Precautions: Fall Restrictions Weight Bearing Restrictions: Yes RLE Weight Bearing: Touchdown weight bearing RLE Partial Weight Bearing Percentage or Pounds: touchdown Other Position/Activity Restrictions: pt on 3L of O2 and O2 stats would drop to 83% in supine position; recover in less than 20 sec; nursing notified    Pertinent Vitals/Pain Pt c/o R hip pain but unable to rate.      Mobility  Bed Mobility Bed Mobility: Sit to Supine Supine to Sit: Not tested (comment) Sitting - Scoot to Edge of Bed: Not tested (comment) Sit to Supine: 3: Mod assist;With rail;HOB flat Scooting to HOB: 2: Max assist Details for Bed Mobility Assistance: Assist for bilateral LEs.  Step by step cueing for technique  Transfers Transfers: Squat Pivot Transfers Sit to Stand: Not tested (comment) Stand to Sit: Not tested (comment) Stand Pivot Transfers: Not tested (comment) Squat Pivot Transfers: 1: +2 Total assist (2 trials) Squat Pivot Transfers: Patient Percentage: 10% Details for Transfer  Assistance: Pt transferred from recliner to 3 in 1 and 3 in 1 to bed with +2 total assist.  Pt has difficulty lifting her body weight with one LE and one UE.  Ambulation/Gait Ambulation/Gait Assistance: Not tested (comment)    Exercises     PT Diagnosis:    PT Problem List:   PT Treatment Interventions:     PT Goals Acute Rehab PT Goals PT Goal Formulation: With patient Time For Goal Achievement: 06/14/12 Potential to Achieve Goals: Good Pt will go Supine/Side to Sit: with supervision Pt will go Sit to Supine/Side: with supervision PT Goal: Sit to Supine/Side - Progress: Progressing toward goal Pt will go Sit to Stand: with min assist PT Goal: Sit to Stand - Progress: Progressing toward goal Pt will go Stand to Sit: with supervision PT Goal: Stand to Sit - Progress: Progressing toward goal Pt will Transfer Bed to Chair/Chair to Bed: with supervision PT Transfer Goal: Bed to Chair/Chair to Bed - Progress: Progressing toward goal  Visit Information  Last PT Received On: 06/08/12 Assistance Needed: +2    Subjective Data      Cognition  Cognition Overall Cognitive Status: Appears within functional limits for tasks assessed/performed Arousal/Alertness: Awake/alert Orientation Level: Appears intact for tasks assessed Behavior During Session: [facility] for tasks performed    Balance  Balance Balance Assessed: No  End of Session PT - End of Session Equipment Utilized During Treatment: Oxygen;Gait belt Activity Tolerance: Patient tolerated treatment well Patient left: in bed;with call bell/phone within reach;with bed alarm set;with nursing in  room Nurse Communication: Mobility status;Precautions;Need for lift equipment   GP     Leena Tiede 06/08/2012, 6:17 PM Gracee Ratterree L. Cierah Crader DPT [phone]

## 2012-06-08 NOTE — Progress Notes (Signed)
SPORTS MEDICINE AND JOINT REPLACEMENT  Georgena Spurling, MD   Altamese Cabal, PA-C [address] [place], [place], [place]  [address]                             [phone]   PROGRESS NOTE  Subjective:  negative for Chest Pain  negative for Shortness of Breath  positive for Nausea/Vomiting   negative for Calf Pain  negative for Bowel Movement   Tolerating Diet: yes         Patient reports pain as 4 on 0-10 scale.    Objective: Vital signs in last 24 hours:   Patient Vitals for the past 24 hrs:  BP Temp Temp src Pulse Resp SpO2  06/08/12 1221 - - - - - 90 %  06/08/12 1211 - - - - - 97 %  06/08/12 0800 - - - - 18 -  06/08/12 0537 116/46 mmHg 98.3 F (36.8 C) - 79 16 93 %  06/08/12 0400 - - - - 16 94 %  06/08/12 0000 - - - - 16 94 %  06/07/12 2100 108/41 mmHg 98.4 F (36.9 C) - - 16 97 %  06/07/12 2000 - - - - 18 94 %  06/07/12 1600 - - - - 16 -  06/07/12 1419 108/51 mmHg 98.7 F (37.1 C) Oral - 18 98 %    @flow {1959:LAST@   Intake/Output from previous day:   03/25 0701 - 03/26 0700 In: 960 [P.O.:960] Out: 1500 [Urine:1500]   Intake/Output this shift:       Intake/Output     03/25 0701 - 03/26 0700 03/26 0701 - 03/27 0700   P.O. 960    I.V. (mL/kg)     Blood     Total Intake(mL/kg) 960 (11.4)    Urine (mL/kg/hr) 1500 (0.7)    Total Output 1500     Net -540             LABORATORY DATA:  Recent Labs  06/05/12 0135 06/05/12 0512 06/06/12 0425 06/06/12 2139 06/07/12 0615 06/08/12 0554  WBC 15.3* 12.1* 8.6 12.4* 9.2 9.8  HGB 13.3 13.6 11.6* 11.5* 10.5* 10.0*  HCT 40.2 41.6 35.2* 34.9* 31.9* 30.7*  PLT 196 169 126* 155 135* 162    Recent Labs  06/05/12 0135 06/05/12 0512 06/05/12 1045 06/06/12 0425 06/06/12 2139 06/07/12 0615 06/08/12 0554  NA 130* 134*  --  135  --  138 133*  K 3.0* 3.3*  --  3.6  --  4.0 3.6  CL 91* 94*  --  99  --  102 96  CO2 25 25  --  24  --  25 28  BUN 23 22  --  22  --  26* 37*  CREATININE 1.02 0.94  --  1.02 0.97  1.05 1.16*  GLUCOSE 236* 230*  --  189*  --  150* 161*  CALCIUM 8.9 9.1 8.4 8.2*  --  8.6 8.5   Lab Results  Component Value Date   INR 1.39 06/08/2012   INR 1.32 06/07/2012   INR 1.38 06/06/2012    Examination:  General appearance: alert, cooperative and no distress Extremities: Homans sign is negative, no sign of DVT  Wound Exam: clean, dry, intact, dressing changed  Drainage:  None: wound tissue dry  Motor Exam: EHL and FHL Intact  Sensory Exam: Deep Peroneal normal   Assessment:    2 Days Post-Op  Procedure(s) (LRB): Open  Reduction Internal Fixation Right HIp (Right)  ADDITIONAL DIAGNOSIS:  Principal Problem:   Fracture of right hip Active Problems:   Fracture of right humerus   Atrial fibrillation   DM (diabetes mellitus)   HTN (hypertension)   Hypertension   Warfarin-induced coagulopathy  Acute Blood Loss Anemia   Plan: Physical Therapy as ordered Touch Down Weight Bearing (TDWB)  DVT Prophylaxis:  Lovenox and Coumadin  DISCHARGE PLAN: Skilled Nursing Facility/Rehab           Ishitha Roper 06/08/2012, 12:59 PM

## 2012-06-08 NOTE — Discharge Summary (Signed)
Physician Discharge Summary  Patient ID: Denise [name] MRN: [id number] DOB/AGE: [birth year] 79 y.o.  Admit date: 06/04/2012 Discharge date: 06/08/2012  Primary Care Physician:  No primary provider on file.  Discharge Diagnoses:    . Atrial fibrillation on Coumadin  . DM (diabetes mellitus) . HTN (hypertension) . Hypertension Fracture of the right hip status post right hip hemiarthroplasty on 06/06/2012 Fracture of the right humerus- nonsurgical  Consults:  Orthopedics, Dr Georgena Spurling  Discharge instructions: - Continue Lovenox 40 mg subcutaneous daily for DVT prophylaxis until INR is above 2 then stop Continue Coumadin, follow INR on 3/ 28/14  Discharge Medications:   Medication List    TAKE these medications       albuterol (5 MG/ML) 0.5% nebulizer solution  Commonly known as:  PROVENTIL  Take 0.5 mLs (2.5 mg total) by nebulization 2 (two) times daily. And q6hours PRN for shortness of breath     amLODipine 5 MG tablet  Commonly known as:  NORVASC  Take 5 mg by mouth every morning.     atorvastatin 10 MG tablet  Commonly known as:  LIPITOR  Take 10 mg by mouth every evening.     bisacodyl 5 MG EC tablet  Commonly known as:  DULCOLAX  Take 1 tablet (5 mg total) by mouth daily as needed.     DSS 100 MG Caps  Take 100 mg by mouth 2 (two) times daily.     enoxaparin 40 MG/0.4ML injection  Commonly known as:  LOVENOX  Inject 0.4 mLs (40 mg total) into the skin daily. Continue until INR above 2 for DVT prophylaxis, then stop.     fish oil-omega-3 fatty acids 1000 MG capsule  Take 1 g by mouth every morning.     glipiZIDE 10 MG tablet  Commonly known as:  GLUCOTROL  Take 10 mg by mouth 2 (two) times daily before a meal.     glucosamine-chondroitin 500-400 MG tablet  Take 1 tablet by mouth every morning.     guaiFENesin 600 MG 12 hr tablet  Commonly known as:  MUCINEX  Take 1 tablet (600 mg total) by mouth 2 (two) times daily.      guaiFENesin-dextromethorphan 100-10 MG/5ML syrup  Commonly known as:  ROBITUSSIN DM  Take 5 mLs by mouth every 4 (four) hours as needed for cough.     hydrochlorothiazide 25 MG tablet  Commonly known as:  HYDRODIURIL  Take 25 mg by mouth every morning.     isosorbide mononitrate 30 MG 24 hr tablet  Commonly known as:  IMDUR  Take 30 mg by mouth every morning.     methimazole 10 MG tablet  Commonly known as:  TAPAZOLE  Take 10 mg by mouth every morning.     methocarbamol 500 MG tablet  Commonly known as:  ROBAXIN  Take 1 tablet (500 mg total) by mouth every 6 (six) hours as needed.     metoprolol succinate 50 MG 24 hr tablet  Commonly known as:  TOPROL-XL  Take 50 mg by mouth every morning. Take with or immediately following a meal.     oxyCODONE 5 MG immediate release tablet  Commonly known as:  Oxy IR/ROXICODONE  Take 1-2 tablets (5-10 mg total) by mouth every 6 (six) hours as needed for pain.     ramipril 10 MG capsule  Commonly known as:  ALTACE  Take 10 mg by mouth every morning.     warfarin 5 MG tablet  Commonly known as:  COUMADIN  Take 5-7.5 mg by mouth daily. Take 7.5mg  on Monday. Take 5mg  all other days of the week         Brief H and P: For complete details please refer to admission H and P, but in briefCarleen T [name] is a 79 y.o. female who got up in the dark to go to her bathroom at about 8:30 pm and tripped over some shoes on the floor and fell onto her right side injuring her right shoulder and right hip. She was taken to the ED by EMS, and found to have fractures of the right humerus, and right hip. She has a histroy of Atrial fibrillation and is on Coumadin. Orthopedics was consulted and patient was admitted.   Hospital Course:   Fracture of right hip -status post right hip hemiarthroplasty on 06/06/12. Patient was closely followed by orthopedics. Currently on Lovenox for DVT prophylaxis until INR above 2.   Shortness of breath with wheezing:  Postoperatively likely secondary to wheezing, bronchitis, fluid overload.  patient was desatting into low 80's on 3LO2. She does not use any oxygen at home. chest x-ray showed cardiomegaly, BNP was elevated at 2182 . Patient was placed on scheduled albuterol nebs, O2, incentive spirometry and given Lasix for volume overload. At the time discharge, patient respiratory status had significantly improved and per her family at the bedside- "better than her baseline". Home O2 eval was done.    Fracture of right humerus: nonoperative per ortho, continue sling   Atrial fibrillation - continue Toprol-XL, rate controlled, on Coumadin   DM (diabetes mellitus): Patient was placed on sliding scale insulin in-patient  HTN (hypertension): Stable  Day of Discharge BP 116/46  Pulse 79  Temp(Src) 98.3 F (36.8 C) (Oral)  Resp 18  Ht 5\' 2"  (1.575 m)  Wt 83.915 kg (185 lb)  BMI 33.83 kg/m2  SpO2 90%  Physical Exam: General: Alert and awake oriented x3 not in any acute distress. CVS: S1-S2 clear no murmur rubs or gallops Chest: minimal scattered wheezing, significantly improved from yesterday  Abdomen: soft nontender, nondistended, normal bowel sounds Extremities: no cyanosis, clubbing or edema noted bilaterally, right arm in sling Neuro: Cranial nerves II-XII intact, no focal neurological deficits   The results of significant diagnostics from this hospitalization (including imaging, microbiology, ancillary and laboratory) are listed below for reference.    LAB RESULTS: Basic Metabolic Panel:  Recent Labs Lab 06/05/12 0835  06/07/12 0615 06/08/12 0554  NA  --   < > 138 133*  K  --   < > 4.0 3.6  CL  --   < > 102 96  CO2  --   < > 25 28  GLUCOSE  --   < > 150* 161*  BUN  --   < > 26* 37*  CREATININE  --   < > 1.05 1.16*  CALCIUM  --   < > 8.6 8.5  MG 1.7  --   --   --   < > = values in this interval not displayed. Liver Function Tests:  Recent Labs Lab 06/05/12 1045  ALBUMIN 3.1*    CBC:  Recent Labs Lab 06/05/12 0135  06/07/12 0615 06/08/12 0554  WBC 15.3*  < > 9.2 9.8  NEUTROABS 13.0*  --   --   --   HGB 13.3  < > 10.5* 10.0*  HCT 40.2  < > 31.9* 30.7*  MCV 85.9  < > 86.4 88.2  PLT 196  < >  135* 162  < > = values in this interval not displayed. Cardiac Enzymes: CBG:  Recent Labs Lab 06/08/12 0645 06/08/12 1143  GLUCAP 148* 147*    Significant Diagnostic Studies:  Dg Shoulder Right  06/05/2012  *RADIOLOGY REPORT*  Clinical Data: Right shoulder pain following fall.  RIGHT SHOULDER - 2+ VIEW  Comparison: None  Findings: A right humeral neck fracture is identified with mild impaction and apex anterior angulation.  There is no evidence of subluxation or dislocation. No other bony abnormalities are identified. The visualized right bony thorax is unremarkable.  IMPRESSION: Right humeral neck fracture as described.   Original Report Authenticated By: Harmon Pier, M.D.    Dg Hip Complete Right  06/05/2012  *RADIOLOGY REPORT*  Clinical Data: Fall with right hip pain.  RIGHT HIP - COMPLETE 2+ VIEW  Comparison: None  Findings: An intertrochanteric right femur fracture is identified with mild varus angulation.  There is mild displacement of the lesser trochanteric fragment. There is no evidence of subluxation or dislocation. No other acute bony abnormalities are identified.  IMPRESSION: Intertrochanteric right femur fracture with mild varus angulation.   Original Report Authenticated By: Harmon Pier, M.D.      Disposition and Follow-up: Discharge Orders   Future Orders Complete By Expires     Diet Carb Modified  As directed     Increase activity slowly  As directed     Touch down weight bearing  As directed         DISPOSITION: Skilled nursing facility for rehabilitation DIET: Carb modified diet ACTIVITY: As tolerated by physical therapy TESTS THAT NEED FOLLOW-UP PT/INR on 06/11/2038  DISCHARGE FOLLOW-UP Follow-up Information   Follow up with  Raymon Mutton, MD. Call on 06/21/2012. (for hospital follow-up)    Contact information:   [address] [place] [address] [phone]       Time spent on Discharge: 40 minutes  Signed:   RAI,RIPUDEEP M.D. [facility] 06/08/2012, 1:22 PM Pager: [phone]

## 2012-06-08 NOTE — Progress Notes (Signed)
Physical Therapy Treatment Patient Details Name: Denise [name] MRN: [id number] DOB: [birth year] Today's Date: 06/08/2012 Time: 5621-3086 PT Time Calculation (min): 27 min  PT Assessment / Plan / Recommendation Comments on Treatment Session  Pt presents with decreased mobility and overall deconditioning. Pt requires max encouragement to participate and is very anxious and fearful secondary to recent fall. Pt rquires 2+ max A for SPT. PT to continue to recommend SNF upon acute D/C.     Follow Up Recommendations  SNF     Does the patient have the potential to tolerate intense rehabilitation     Barriers to Discharge        Equipment Recommendations  None recommended by PT    Recommendations for Other Services    Frequency Min 3X/week   Plan Discharge plan remains appropriate;Frequency needs to be updated    Precautions / Restrictions Restrictions Weight Bearing Restrictions: Yes RLE Weight Bearing: Touchdown weight bearing   Pertinent Vitals/Pain Pt denied any pain at beginning of session. Pt had been premedicated. Pt verbalized 6/10 pain post treatment.     Mobility  Bed Mobility Bed Mobility: Supine to Sit;Sitting - Scoot to Edge of Bed Supine to Sit: 2: Max assist;With rails;HOB elevated Sitting - Scoot to Edge of Bed: 3: Mod assist;With rail Details for Bed Mobility Assistance: required max encouragement to participate. Pt on 4L O2 today, O2 stat stayed in 90s sitting EOB. Pt required UE bracing to keep balance. cues for hand placement and sequencing.  Transfers Transfers: Stand Pivot Transfers;Stand to Sit;Sit to Stand Sit to Stand: 1: +2 Total assist;From bed Sit to Stand: Patient Percentage: 10% Stand to Sit: 1: +2 Total assist;To chair/3-in-1 Stand to Sit: Patient Percentage: 10% Stand Pivot Transfers: 1: +2 Total assist Stand Pivot Transfers: Patient Percentage: 10% Details for Transfer Assistance: Pt required max encouragement, pt very fearful and anxious with  transfer. Pt required constant cues and facilitation to maintain TDWB status on R LE. Pt demo decreased safety awarenesss.  Ambulation/Gait Ambulation/Gait Assistance: Not tested (comment) Stairs: No Wheelchair Mobility Wheelchair Mobility: No    Exercises     PT Diagnosis:    PT Problem List:   PT Treatment Interventions:     PT Goals Acute Rehab PT Goals PT Goal Formulation: With patient Time For Goal Achievement: 06/14/12 Potential to Achieve Goals: Good PT Goal: Supine/Side to Sit - Progress: Progressing toward goal PT Goal: Sit at Edge Of Bed - Progress: Progressing toward goal PT Goal: Sit to Stand - Progress: Progressing toward goal PT Goal: Stand to Sit - Progress: Progressing toward goal PT Transfer Goal: Bed to Chair/Chair to Bed - Progress: Progressing toward goal PT Goal: Ambulate - Progress: Progressing toward goal  Visit Information  Last PT Received On: 06/08/12 Assistance Needed: +2    Subjective Data  Subjective: i dont think i can get up  Patient Stated Goal: to go home   Cognition  Cognition Overall Cognitive Status: Appears within functional limits for tasks assessed/performed Arousal/Alertness: Awake/alert Orientation Level: Appears intact for tasks assessed Behavior During Session: [facility] for tasks performed    Balance  Balance Balance Assessed: Yes Static Sitting Balance Static Sitting - Balance Support: Left upper extremity supported;Feet supported Static Sitting - Level of Assistance: 5: Stand by assistance  End of Session PT - End of Session Equipment Utilized During Treatment: Oxygen;Gait belt Activity Tolerance: Patient limited by fatigue Patient left: in chair;with call bell/phone within reach;with nursing in room;with family/visitor present Nurse Communication: Mobility status;Precautions;Patient requests pain  meds   GP     Donnamarie Poag Hilliard,  [phone] 06/08/2012, 1:14 PM

## 2012-06-09 LAB — CBC
HCT: 28.5 % — ABNORMAL LOW (ref 36.0–46.0)
Hemoglobin: 9.6 g/dL — ABNORMAL LOW (ref 12.0–15.0)
MCH: 28.6 pg (ref 26.0–34.0)
MCHC: 33.7 g/dL (ref 30.0–36.0)
RBC: 3.36 MIL/uL — ABNORMAL LOW (ref 3.87–5.11)

## 2012-06-09 LAB — GLUCOSE, CAPILLARY
Glucose-Capillary: 123 mg/dL — ABNORMAL HIGH (ref 70–99)
Glucose-Capillary: 171 mg/dL — ABNORMAL HIGH (ref 70–99)

## 2012-06-09 LAB — URINE CULTURE

## 2012-06-09 LAB — PROTIME-INR
INR: 1.47 (ref 0.00–1.49)
Prothrombin Time: 17.4 seconds — ABNORMAL HIGH (ref 11.6–15.2)

## 2012-06-09 MED ORDER — WARFARIN SODIUM 7.5 MG PO TABS
7.5000 mg | ORAL_TABLET | Freq: Once | ORAL | Status: DC
  Filled 2012-06-09: qty 1

## 2012-06-09 NOTE — Progress Notes (Signed)
ANTICOAGULATION CONSULT NOTE - Follow Up Consult  Pharmacy Consult for Coumadin Indication: Afib, VTE prophylaxis s/p hip procedure   Allergies  Allergen Reactions  . Iohexol Rash     Desc: VOMITING-VERIFIED ON 05-24-04 PRE-PROCEDURE/ ARS   . Shrimp (Shellfish Allergy) Nausea And Vomiting and Rash    Patient Measurements: Height: 5\' 2"  (157.5 cm) Weight: 185 lb (83.915 kg) IBW/kg (Calculated) : 50.1 Heparin Dosing Weight:   Vital Signs: Temp: 99.1 F (37.3 C) (03/27 0635) BP: 145/68 mmHg (03/27 1118) Pulse Rate: 78 (03/27 0635)  Labs:  Recent Labs  06/06/12 2139 06/07/12 0615 06/08/12 0554 06/09/12 0500  HGB 11.5* 10.5* 10.0* 9.6*  HCT 34.9* 31.9* 30.7* 28.5*  PLT 155 135* 162 182  LABPROT  --  16.1* 16.7* 17.4*  INR  --  1.32 1.39 1.47  CREATININE 0.97 1.05 1.16*  --     Estimated Creatinine Clearance: 35 ml/min (by C-G formula based on Cr of 1.16).  Assessment: 88yof on Coumadin for Afib and VTE prophylaxis s/p hip procedure. INR (1.49) is subtherapeutic but is trending up slowly. Pre-op Vitamin K 5mg  IV (received on 3/23) may be contributing to the delayed response to higher doses than PTA Coumadin regimen (5mg  daily except 7.5mg  on Mondays). Patient is also receiving Lovenox 40mg  daily until INR >1.8 per MD orders.  - H/H/Plts wnl - No significant bleeding reported  Goal of Therapy:  INR 2-3  Plan:  1. Repeat Coumadin 7.5mg  po x 1 today 2. Follow-up AM INR  Bayard Hugger, PharmD, BCPS  Clinical Pharmacist  Pager: [phone]   06/09/2012,12:03 PM

## 2012-06-09 NOTE — Progress Notes (Signed)
Pt still unable to void,612cc bladder scan, text to Dr Rai,orders received.shift chg report given to sylvia H  rn

## 2012-06-09 NOTE — Progress Notes (Signed)
Pt has been on bedpan unable to void,refused oob to bsc,given cafienated diet coke and enc fluids,bladder scanned for 214cc,has been incont x 1 in bed.Denise [name] D

## 2012-06-09 NOTE — Progress Notes (Signed)
Patient ID: Denise [name]  female  ZOX:[id number]    DOB: [birth year]    DOA: 06/04/2012  PCP: No primary provider on file.  Assessment/Plan: Principal Problem:   Fracture of right hip - postop day 3, management per orthopedics - On Coumadin for DVT prophylaxis  Active Problems:  Urinary retention: patient did not pass the voiding trial - Placed back on Foley catheter, discussed with on-call urology, Dr Vernie Ammons, recommended to continue Foley for 5-7 days, will see in the office for voiding trial and evaluation ( per Dr. Vernie Ammons, commonly happens after hip fracture surgery)    Shortness of breath with wheezing:resolved - Saline locked, stat chest x-ray showed cardiomegaly, BNP elevated at 2182, improved after receiving one dose of Lasix - placed on scheduled albuterol nebs, O2, incentive spirometry    Fracture of right humerus: nonoperative per ortho, continue sling    Atrial fibrillation - continue Toprol-XL, rate controlled, on Coumadin    DM (diabetes mellitus) - Continue sliding scale insulin    HTN (hypertension): Stable  DVT Prophylaxis:on Coumadin  Code Status:full code  Disposition: stable for DC today to skilled nursing facility  Subjective: Unable to void, Foley placed today  Objective: Weight change:   Intake/Output Summary (Last 24 hours) at 06/09/12 1246 Last data filed at 06/09/12 0636  Gross per 24 hour  Intake    600 ml  Output    400 ml  Net    200 ml   Blood pressure 145/68, pulse 78, temperature 99.1 F (37.3 C), temperature source Oral, resp. rate 18, height 5\' 2"  (1.575 m), weight 83.915 kg (185 lb), SpO2 96.00%.  Physical Exam: General: A x O x3 CVS: S1-S2 clear Chest: CTAB Abdomen: soft NT, ND, NBS Extremities: no c/c/e   Lab Results: Basic Metabolic Panel:  Recent Labs Lab 06/05/12 0835  06/07/12 0615 06/08/12 0554  NA  --   < > 138 133*  K  --   < > 4.0 3.6  CL  --   < > 102 96  CO2  --   < > 25 28  GLUCOSE  --   < > 150*  161*  BUN  --   < > 26* 37*  CREATININE  --   < > 1.05 1.16*  CALCIUM  --   < > 8.6 8.5  MG 1.7  --   --   --   < > = values in this interval not displayed. Liver Function Tests:  Recent Labs Lab 06/05/12 1045  ALBUMIN 3.1*   No results found for this basename: LIPASE, AMYLASE,  in the last 168 hours No results found for this basename: AMMONIA,  in the last 168 hours CBC:  Recent Labs Lab 06/05/12 0135  06/08/12 0554 06/09/12 0500  WBC 15.3*  < > 9.8 9.2  NEUTROABS 13.0*  --   --   --   HGB 13.3  < > 10.0* 9.6*  HCT 40.2  < > 30.7* 28.5*  MCV 85.9  < > 88.2 84.8  PLT 196  < > 162 182  < > = values in this interval not displayed. Cardiac Enzymes: No results found for this basename: CKTOTAL, CKMB, CKMBINDEX, TROPONINI,  in the last 168 hours BNP: No components found with this basename: POCBNP,  CBG:  Recent Labs Lab 06/08/12 1143 06/08/12 1628 06/08/12 2227 06/09/12 0726 06/09/12 1151  GLUCAP 147* 165* 150* 123* 171*     Micro Results: Recent Results (from the past 240 hour(s))  URINE CULTURE     Status: None   Collection Time    06/05/12 11:03 AM      Result Value Range Status   Specimen Description URINE, CATHETERIZED   Final   Special Requests NONE   Final   Culture  Setup Time 06/05/2012 18:06   Final   Colony Count NO GROWTH   Final   Culture NO GROWTH   Final   Report Status 06/06/2012 FINAL   Final  SURGICAL PCR SCREEN     Status: None   Collection Time    06/06/12  7:53 AM      Result Value Range Status   MRSA, PCR NEGATIVE  NEGATIVE Final   Staphylococcus aureus NEGATIVE  NEGATIVE Final   Comment:            The Xpert SA Assay (FDA     approved for NASAL specimens     in patients over 23 years of age),     is one component of     a comprehensive surveillance     program.  Test performance has     been validated by [employer] for patients greater     than or equal to 63 year old.     It is not intended     to diagnose infection nor  to     guide or monitor treatment.  URINE CULTURE     Status: None   Collection Time    06/06/12  2:56 PM      Result Value Range Status   Specimen Description URINE, CATHETERIZED   Final   Special Requests none Normal   Final   Culture  Setup Time 06/06/2012 15:40   Final   Colony Count NO GROWTH   Final   Culture NO GROWTH   Final   Report Status 06/07/2012 FINAL   Final    Studies/Results: Dg Chest 2 View  06/07/2012  *RADIOLOGY REPORT*  Clinical Data: Dyspnea.  Coarsened lung sounds.  CHEST - 2 VIEW  Comparison: 05/27/2004  Findings: There is a left chest wall pacer device with leads in the right atrial appendage and right ventricle.  The heart size is moderately enlarged. No pleural effusion or edema.  Similar appearance of left midlung scarring.  IMPRESSION:  1.  Cardiac enlargement. 2.  Left midlung scarring.   Original Report Authenticated By: Signa Kell, M.D.    Dg Shoulder Right  06/05/2012  *RADIOLOGY REPORT*  Clinical Data: Right shoulder pain following fall.  RIGHT SHOULDER - 2+ VIEW  Comparison: None  Findings: A right humeral neck fracture is identified with mild impaction and apex anterior angulation.  There is no evidence of subluxation or dislocation. No other bony abnormalities are identified. The visualized right bony thorax is unremarkable.  IMPRESSION: Right humeral neck fracture as described.   Original Report Authenticated By: Harmon Pier, M.D.    Dg Hip Complete Right  06/05/2012  *RADIOLOGY REPORT*  Clinical Data: Fall with right hip pain.  RIGHT HIP - COMPLETE 2+ VIEW  Comparison: None  Findings: An intertrochanteric right femur fracture is identified with mild varus angulation.  There is mild displacement of the lesser trochanteric fragment. There is no evidence of subluxation or dislocation. No other acute bony abnormalities are identified.  IMPRESSION: Intertrochanteric right femur fracture with mild varus angulation.   Original Report Authenticated By: Harmon Pier, M.D.    Dg Hip Operative Right  06/06/2012  *RADIOLOGY REPORT*  Clinical  Data: Right hip fracture.  DG OPERATIVE RIGHT HIP  Comparison: 06/05/2012.  Findings: The dynamic hip screw is in good position.  Anatomic reduction of the intertrochanteric fracture.  The side plate is not imaged.  IMPRESSION: Anatomic reduction of the intertrochanteric fracture with internal fixation.   Original Report Authenticated By: Rudie Meyer, M.D.    Dg Pelvis Portable  06/06/2012  *RADIOLOGY REPORT*  Clinical Data: Postop  PORTABLE PELVIS  Comparison: Yesterday  Findings: Dynamic compression screw and side plate now transfix an intertrochanteric right proximal femur fracture.  Anatomic alignment of the osseous and prosthetic structures.  No breakage or loosening of the hardware.  Osteopenia.  IMPRESSION: ORIF right intertrochanteric femur fracture.   Original Report Authenticated By: Jolaine Click, M.D.     Medications: Scheduled Meds: . amLODipine  5 mg Oral q morning - 10a  . atorvastatin  10 mg Oral QPM  . docusate sodium  100 mg Oral BID  . enoxaparin (LOVENOX) injection  40 mg Subcutaneous Q24H  . glipiZIDE  10 mg Oral BID AC  . guaiFENesin  600 mg Oral BID  . hydrochlorothiazide  25 mg Oral q morning - 10a  . insulin aspart  0-15 Units Subcutaneous TID WC  . insulin aspart  0-5 Units Subcutaneous QHS  . isosorbide mononitrate  30 mg Oral q morning - 10a  . methimazole  10 mg Oral q morning - 10a  . metoprolol succinate  50 mg Oral QPC breakfast  . ramipril  10 mg Oral q morning - 10a  . warfarin  7.5 mg Oral ONCE-1800  . Warfarin - Pharmacist Dosing Inpatient   Does not apply q1800      LOS: 5 days   Amelia Macken M.D. [facility] 06/09/2012, 12:46 PM Pager: [phone]  If 7PM-7AM, please contact night-coverage [email or web address] Password [email or web address]

## 2012-06-10 LAB — VITAMIN D 1,25 DIHYDROXY
Vitamin D 1, 25 (OH)2 Total: 38 pg/mL (ref 18–72)
Vitamin D3 1, 25 (OH)2: 38 pg/mL

## 2012-09-05 ENCOUNTER — Ambulatory Visit: Payer: PRIVATE HEALTH INSURANCE | Admitting: Cardiovascular Disease

## 2012-09-06 ENCOUNTER — Ambulatory Visit (INDEPENDENT_AMBULATORY_CARE_PROVIDER_SITE_OTHER): Payer: Medicare Other | Admitting: Cardiovascular Disease

## 2012-09-06 ENCOUNTER — Encounter: Payer: Self-pay | Admitting: Cardiovascular Disease

## 2012-09-06 VITALS — BP 116/60 | HR 70 | Ht 61.0 in | Wt 169.0 lb

## 2012-09-06 DIAGNOSIS — I10 Essential (primary) hypertension: Secondary | ICD-10-CM

## 2012-09-06 DIAGNOSIS — Z950 Presence of cardiac pacemaker: Secondary | ICD-10-CM | POA: Insufficient documentation

## 2012-09-06 DIAGNOSIS — I4891 Unspecified atrial fibrillation: Secondary | ICD-10-CM

## 2012-09-06 NOTE — Progress Notes (Signed)
Patient ID: Denise [name], female   DOB: October 19, 1923, 79 y.o.   MRN: [id number]     HPI: Denise [name], is a 79 y.o. female who presents to the office today for cardiology evaluation. She has a history of permanent atrial fibrillation status post AV node ablation by Dr. Severiano Gilbert and permanent pacemaker implantation in June 2006. She has a history of hypertensive cardiomyopathy. She has documented severe LA dilatation. On last echo in December 2012 ejection fraction was 45-50%. She had moderate to severe MR, moderate pulmonary hypertension a nodular sclerosis of the aortic valve the  She has a dual-chamber St. Jude identity right ear XL DR device. She last saw Dr. Royann Shivers for for pacemaker interrogation in October 2013.  Since I last saw her she broke her right hip and her right arm and required surgery by Dr. Valentina Gu. After being hospitalized he went to a nursing facility from [place] through last week. She presents today for cardiology evaluation. She denies recent chest pain. She denies significant shortness of breath. Time she does note some mild leg swelling.  Past Medical History  Diagnosis Date  . Diabetes mellitus without complication   . Thyroid disease   . A-fib   . Hypertension   . CHF (congestive heart failure)     Past Surgical History  Procedure Laterality Date  . Pacemaker insertion    . Abdominal hysterectomy    . Tubal ligation    . Cholecystectomy    . Thyroidectomy    . Knee replacement      R  . Compression hip screw Right 06/06/2012    Procedure: Open Reduction Internal Fixation Right HIp;  Surgeon: Dannielle Huh, MD;  Location: [facility] OR;  Service: Orthopedics;  Laterality: Right;    Allergies  Allergen Reactions  . Iohexol Rash     Desc: VOMITING-VERIFIED ON 05-24-04 PRE-PROCEDURE/ ARS   . Shrimp (Shellfish Allergy) Nausea And Vomiting and Rash    Current Outpatient Prescriptions  Medication Sig Dispense Refill  . albuterol (PROVENTIL) (5 MG/ML) 0.5% nebulizer  solution Take 0.5 mLs (2.5 mg total) by nebulization 2 (two) times daily. And q6hours PRN for shortness of breath  20 mL    . amLODipine (NORVASC) 5 MG tablet Take 5 mg by mouth every morning.      [name] atorvastatin (LIPITOR) 10 MG tablet Take 10 mg by mouth every evening.      . fish oil-omega-3 fatty acids 1000 MG capsule Take 1 g by mouth every morning.      [name] glipiZIDE (GLUCOTROL) 10 MG tablet Take 10 mg by mouth 2 (two) times daily before a meal.      . glucosamine-chondroitin 500-400 MG tablet Take 1 tablet by mouth every morning.      . hydrochlorothiazide (HYDRODIURIL) 25 MG tablet Take 25 mg by mouth every morning.      . isosorbide mononitrate (IMDUR) 30 MG 24 hr tablet Take 30 mg by mouth every morning.      . methimazole (TAPAZOLE) 10 MG tablet Take 10 mg by mouth every morning.      . metoprolol succinate (TOPROL-XL) 50 MG 24 hr tablet Take 50 mg by mouth every morning. Take with or immediately following a meal.      . ramipril (ALTACE) 10 MG capsule Take 10 mg by mouth every morning.      . warfarin (COUMADIN) 5 MG tablet Take 5-7.5 mg by mouth daily. Take 7.5mg  on Monday. Take 5mg  all other days of the  week       No current facility-administered medications for this visit.    Socially she is widowed and has 4 children 4 grandchildren 4 great-grandchildren. There is no tobacco or alcohol use.  ROS is negative for fever chills or night sweats. She does note some leg swelling. She denies PND or orthopnea. She denies palpitations. She denies presyncope. She denies bleeding. She does note leg swelling. She denies rash. Other system review is negative.  PE BP 116/60  Pulse 70  Ht 5\' 1"  (1.549 m)  Wt 169 lb (76.658 kg)  BMI 31.95 kg/m2  General: Alert, oriented, no distress.  Skin: normal turgor, no rashes HEENT: Normocephalic, atraumatic. Pupils round and reactive; sclera anicteric;no lid lag,  Nose without nasal septal hypertrophy Mouth/Parynx benign; Mallinpatti scale 3 Neck:  No JVD, no carotid briuts Lungs: clear to ausculatation and percussion; no wheezing or rales Heart: RRR, s1 s2 normal 2/6 systolic murmur. Abdomen: Moderate central adiposity. soft, nontender; no hepatosplenomehaly, BS+; abdominal aorta nontender and not dilated by palpation. Pulses 2+ Extremities: Trace bilateral ankle edema. no clubbing cyanosis., Homan's sign negative  Neurologic: grossly nonfocal  ECG: Underlying atrial fibrillation with ventricular paced rhythm at 70 beats per minute with 100% capture.  LABS:  BMET    Component Value Date/Time   NA 133* 06/08/2012 0554   K 3.6 06/08/2012 0554   CL 96 06/08/2012 0554   CO2 28 06/08/2012 0554   GLUCOSE 161* 06/08/2012 0554   BUN 37* 06/08/2012 0554   CREATININE 1.16* 06/08/2012 0554   CALCIUM 8.5 06/08/2012 0554   GFRNONAA 41* 06/08/2012 0554   GFRAA 48* 06/08/2012 0554     Hepatic Function Panel     Component Value Date/Time   ALBUMIN 3.1* 06/05/2012 1045     CBC    Component Value Date/Time   WBC 9.2 06/09/2012 0500   RBC 3.36* 06/09/2012 0500   HGB 9.6* 06/09/2012 0500   HCT 28.5* 06/09/2012 0500   PLT 182 06/09/2012 0500   MCV 84.8 06/09/2012 0500   MCH 28.6 06/09/2012 0500   MCHC 33.7 06/09/2012 0500   RDW 16.3* 06/09/2012 0500   LYMPHSABS 1.4 06/05/2012 0135   MONOABS 0.9 06/05/2012 0135   EOSABS 0.0 06/05/2012 0135   BASOSABS 0.0 06/05/2012 0135     BNP    Component Value Date/Time   PROBNP 2182.0* 06/07/2012 0923    Lipid Panel  No results found for this basename: chol, trig, hdl, cholhdl, vldl, ldlcalc     RADIOLOGY: No results found.    ASSESSMENT AND PLAN: Clinically, [name] seems to be improved. On exam she does not have any findings suggestive of heart failure. She does have some mild residual bilateral ankle swelling. She is now back on her medications that she was on previously. Her blood pressure is well controlled. She has lost approximately 13 pounds since her last office visit in October 2013.  Discussed sodium restriction. She is not having anginal symptoms. Pacemaker is working appropriately. I will see her back in 6 months for cardiology reevaluation.     Lennette Bihari, MD, [facility]  09/06/2012 9:40 PM

## 2012-09-06 NOTE — Patient Instructions (Signed)
Your physician recommends that you schedule a follow-up appointment in: 6 months.  No changes have been made today. 

## 2012-10-13 ENCOUNTER — Encounter: Payer: Self-pay | Admitting: *Deleted

## 2012-10-17 ENCOUNTER — Encounter: Payer: Self-pay | Admitting: Cardiovascular Disease

## 2012-10-18 ENCOUNTER — Encounter: Payer: Self-pay | Admitting: Cardiovascular Disease

## 2012-10-18 ENCOUNTER — Ambulatory Visit (INDEPENDENT_AMBULATORY_CARE_PROVIDER_SITE_OTHER): Payer: Medicare Other | Admitting: Cardiovascular Disease

## 2012-10-18 ENCOUNTER — Other Ambulatory Visit: Payer: Self-pay | Admitting: Cardiovascular Disease

## 2012-10-18 VITALS — BP 116/60 | HR 66 | Ht 62.0 in | Wt 164.0 lb

## 2012-10-18 DIAGNOSIS — Z950 Presence of cardiac pacemaker: Secondary | ICD-10-CM

## 2012-10-18 DIAGNOSIS — I442 Atrioventricular block, complete: Secondary | ICD-10-CM

## 2012-10-18 DIAGNOSIS — I10 Essential (primary) hypertension: Secondary | ICD-10-CM

## 2012-10-18 DIAGNOSIS — I428 Other cardiomyopathies: Secondary | ICD-10-CM

## 2012-10-18 DIAGNOSIS — I4891 Unspecified atrial fibrillation: Secondary | ICD-10-CM

## 2012-10-18 NOTE — Patient Instructions (Addendum)
INR today 2.1.  Continue same dose of Coumadin (warfarin).  Your physician recommends that you schedule a follow-up appointment in: 6months

## 2012-10-20 DIAGNOSIS — I428 Other cardiomyopathies: Secondary | ICD-10-CM | POA: Insufficient documentation

## 2012-10-20 DIAGNOSIS — I442 Atrioventricular block, complete: Secondary | ICD-10-CM | POA: Insufficient documentation

## 2012-10-20 NOTE — Assessment & Plan Note (Signed)
At least part of her left ventricular dysfunction is related to pacing induced the dyssynchrony. She also has moderate to severe mitral insufficiency (which was reported as being only mild to moderate when she was initially diagnosed with cardiomyopathy) and moderate tricuspid insufficiency and moderate pulmonary hypertension with PA pressure of greater than 50 mm Hg. She appears to be clinically euvolemic and does not have symptoms of congestive heart failure albeit with a very sedentary lifestyle. She does not appear to be a good surgical candidate for mitral valve repair as far as I can tell.

## 2012-10-20 NOTE — Assessment & Plan Note (Signed)
Adequate control. 

## 2012-10-20 NOTE — Progress Notes (Signed)
Patient ID: Denise [name], female   DOB: Apr 08, 1923, 79 y.o.   MRN: [id number]     Reason for office visit Pacemaker check, atrial fibrillation, nonischemic cardiomyopathy  Denise [name] has really done well since her last appointment. She leads a very sedentary lifestyle. She is pacemaker dependent:: permanent atrial fibrillation and complete heart block. She denies any complaints at this time. She specifically denies having shortness of breath or chest pain.  Allergies  Allergen Reactions  . Iohexol Rash     Desc: VOMITING-VERIFIED ON 05-24-04 PRE-PROCEDURE/ ARS   . Shrimp (Shellfish Allergy) Nausea And Vomiting and Rash    Current Outpatient Prescriptions  Medication Sig Dispense Refill  . albuterol (PROVENTIL) (5 MG/ML) 0.5% nebulizer solution Take 0.5 mLs (2.5 mg total) by nebulization 2 (two) times daily. And q6hours PRN for shortness of breath  20 mL    . amLODipine (NORVASC) 5 MG tablet Take 5 mg by mouth every morning.      [name] atorvastatin (LIPITOR) 10 MG tablet Take 10 mg by mouth every evening.      . fish oil-omega-3 fatty acids 1000 MG capsule Take 1 g by mouth every morning.      [name] glipiZIDE (GLUCOTROL) 10 MG tablet Take 10 mg by mouth daily.       [name] glucosamine-chondroitin 500-400 MG tablet Take 1 tablet by mouth as needed.       . hydrochlorothiazide (HYDRODIURIL) 25 MG tablet Take 25 mg by mouth every morning.      . isosorbide mononitrate (IMDUR) 30 MG 24 hr tablet Take 30 mg by mouth every morning.      . methimazole (TAPAZOLE) 10 MG tablet Take 10 mg by mouth every morning.      . metoprolol succinate (TOPROL-XL) 50 MG 24 hr tablet Take 50 mg by mouth every morning. Take with or immediately following a meal.      . ramipril (ALTACE) 10 MG capsule Take 10 mg by mouth every morning.      . warfarin (COUMADIN) 5 MG tablet Take 5-7.5 mg by mouth daily. Take 7.5mg  on Monday. Take 5mg  all other days of the week       No current facility-administered medications for this  visit.    Past Medical History  Diagnosis Date  . Diabetes mellitus without complication   . Thyroid disease   . Permanent atrial fibrillation   . Hypertension   . CHF (congestive heart failure)   . LVH (left ventricular hypertrophy)   . Mitral regurgitation     mod to severe  . Tricuspid regurgitation     severe  . Pulmonary hypertension     severe    Past Surgical History  Procedure Laterality Date  . Pacemaker insertion  05/26/2004    St.Jude  . Abdominal hysterectomy    . Tubal ligation    . Cholecystectomy    . Thyroidectomy    . Knee replacement      R  . Compression hip screw Right 06/06/2012    Procedure: Open Reduction Internal Fixation Right HIp;  Surgeon: Dannielle Huh, MD;  Location: [facility] OR;  Service: Orthopedics;  Laterality: Right;  . Cardioversion  5 /18/ 2000    unsuccessful  . US echocardiography  02/25/2011    mod to severe MR,LA severe dilated,mild annular ca+,mod. TR,mod PI,mod ca+ of the aortic valve leaflet  . Nm myocar perf wall motion  02/28/2009    No signficant ischemia    Family History  Problem Relation  Age of Onset  . Asthma Mother   . Heart attack Father   . Stroke Sister   . Heart attack Brother   . Cancer - Colon Brother   . Gallbladder disease Maternal Grandmother   . Stroke Maternal Grandfather   . Heart disease Child     History   Social History  . Marital Status: Widowed    Spouse Name: N/A    Number of Children: N/A  . Years of Education: N/A   Occupational History  . Not on file.   Social History Main Topics  . Smoking status: Never Smoker   . Smokeless tobacco: Not on file  . Alcohol Use: No  . Drug Use: No  . Sexually Active: No   Other Topics Concern  . Not on file   Social History Narrative  . No narrative on file    Review of systems: .mcrrosv   PHYSICAL EXAM BP 116/60  Pulse 66  Ht 5\' 2"  (1.575 m)  Wt 164 lb (74.39 kg)  BMI 29.99 kg/m2  General: Alert, oriented x3, no distress,  poor Obese Head: no evidence of trauma, PERRL, EOMI, no exophtalmos or lid lag, no myxedema, no xanthelasma; normal ears, nose and oropharynx Neck: normal jugular venous pulsations and no hepatojugular reflux; brisk carotid pulses without delay and no carotid bruits Chest: clear to auscultation, no signs of consolidation by percussion or palpation, normal fremitus, symmetrical and full respiratory excursions Cardiovascular: normal position and quality of the apical impulse, regular rhythm, normal first and paradoxically split second heart sounds, no  rubs or gallops, 2 or 6 holosystolic murmur is heard both at the left lower sternal border and apex Abdomen: no tenderness or distention, no masses by palpation, no abnormal pulsatility or arterial bruits, normal bowel sounds, no hepatosplenomegaly Extremities: no clubbing, cyanosis or edema; 2+ radial, ulnar and brachial pulses bilaterally; 2+ right femoral, posterior tibial and dorsalis pedis pulses; 2+ left femoral, posterior tibial and dorsalis pedis pulses; no subclavian or femoral bruits Neurological: grossly nonfocal   EKG: Atrial fibrillation, 100% ventricular pacing  Lipid Panel  No results found for this basename: chol, trig, hdl, cholhdl, vldl, ldlcalc    BMET    Component Value Date/Time   NA 133* 06/08/2012 0554   K 3.6 06/08/2012 0554   CL 96 06/08/2012 0554   CO2 28 06/08/2012 0554   GLUCOSE 161* 06/08/2012 0554   BUN 37* 06/08/2012 0554   CREATININE 1.16* 06/08/2012 0554   CALCIUM 8.5 06/08/2012 0554   GFRNONAA 41* 06/08/2012 0554   GFRAA 48* 06/08/2012 0554     ASSESSMENT AND PLAN Pacemaker She has permanent atrial fibrillation and complete heart block. She is device dependent Full pacemaker check performed in the clinic today including active testing of ventricular lead thresholds. The device is programmed DDIR with an inactive atrial lead to permanent atrial fibrillation. It was implanted in 2006 and by voltage still has  roughly 5-6 years of longevity. The ventricular lead is a St. Jude 1488 TC with an impedance of 461 ohms, threshold of 0.3 V 0.5 ms pulse width. She is pacemaker dependent. There is no escape rhythm, about any detectable R waves 1 pacing was taken down to 30 beats per minute. There is a satisfactory distribution of the heart rate histograms.  Atrial fibrillation On warfarin anticoagulation. With protimes checked remotely , dosed via Coumadin clinic  Hypertension Adequate control  Nonischemic cardiomyopathy At least part of her left ventricular dysfunction is related to pacing induced the  dyssynchrony. She also has moderate to severe mitral insufficiency (which was reported as being only mild to moderate when she was initially diagnosed with cardiomyopathy) and moderate tricuspid insufficiency and moderate pulmonary hypertension with PA pressure of greater than 50 mm Hg. She appears to be clinically euvolemic and does not have symptoms of congestive heart failure albeit with a very sedentary lifestyle. She does not appear to be a good surgical candidate for mitral valve repair as far as I can tell.  Pacemaker check in 6 months. Per device is not capable of remote monitoring. It is difficult for the family to bring her to office appointments. She'll followup with Dr. Tresa Endo for other cardiac problems. Junious Silk, MD, [facility] [phone] office [phone] pager

## 2012-10-20 NOTE — Assessment & Plan Note (Signed)
That is post AV node ablation 2006. On today's testing there is no escape rhythm.

## 2012-10-20 NOTE — Assessment & Plan Note (Addendum)
She has permanent atrial fibrillation and complete heart block. She is device dependent Full pacemaker check performed in the clinic today including active testing of ventricular lead thresholds. The device is programmed DDIR with an inactive atrial lead to permanent atrial fibrillation. It was implanted in 2006 and by voltage still has roughly 5-6 years of longevity. The ventricular lead is a St. Jude 1488 TC with an impedance of 461 ohms, threshold of 0.3 V 0.5 ms pulse width. She is pacemaker dependent. There is no escape rhythm, without any detectable R waves once pacing was taken down to 30 beats per minute. There is a satisfactory distribution on the heart rate histograms.

## 2012-10-20 NOTE — Assessment & Plan Note (Signed)
On warfarin anticoagulation. With protimes checked remotely , dosed via Coumadin clinic

## 2012-10-21 LAB — PACEMAKER DEVICE OBSERVATION
BATTERY VOLTAGE: 2.76 V
BMOD-0002RV: 12
BRDY-0002RV: 70 {beats}/min
BRDY-0004RV: 130 {beats}/min
DEVICE MODEL PM: 1457119
RV LEAD THRESHOLD: 0.375 V
VENTRICULAR PACING PM: 99

## 2012-12-28 ENCOUNTER — Encounter: Payer: Self-pay | Admitting: Cardiovascular Disease

## 2013-02-20 ENCOUNTER — Other Ambulatory Visit: Payer: Self-pay

## 2013-02-20 MED ORDER — METOPROLOL SUCCINATE ER 50 MG PO TB24: 50.0000 mg | ORAL_TABLET | Freq: Every day | ORAL | Status: DC

## 2013-02-20 NOTE — Telephone Encounter (Signed)
Rx was sent to pharmacy electronically. 

## 2013-02-28 ENCOUNTER — Encounter: Payer: Self-pay | Admitting: Cardiovascular Disease

## 2013-02-28 ENCOUNTER — Ambulatory Visit (INDEPENDENT_AMBULATORY_CARE_PROVIDER_SITE_OTHER): Payer: Medicare Other | Admitting: Cardiovascular Disease

## 2013-02-28 ENCOUNTER — Ambulatory Visit (INDEPENDENT_AMBULATORY_CARE_PROVIDER_SITE_OTHER): Payer: Medicare Other | Admitting: Pharmacist Clinician (PhC)/ Clinical Pharmacy Specialist

## 2013-02-28 VITALS — BP 130/66 | Ht 62.0 in | Wt 155.6 lb

## 2013-02-28 DIAGNOSIS — E119 Type 2 diabetes mellitus without complications: Secondary | ICD-10-CM

## 2013-02-28 DIAGNOSIS — Z7901 Long term (current) use of anticoagulants: Secondary | ICD-10-CM

## 2013-02-28 DIAGNOSIS — Z950 Presence of cardiac pacemaker: Secondary | ICD-10-CM

## 2013-02-28 DIAGNOSIS — I10 Essential (primary) hypertension: Secondary | ICD-10-CM

## 2013-02-28 DIAGNOSIS — I4891 Unspecified atrial fibrillation: Secondary | ICD-10-CM

## 2013-02-28 DIAGNOSIS — I442 Atrioventricular block, complete: Secondary | ICD-10-CM

## 2013-02-28 NOTE — Patient Instructions (Signed)
Your physician recommends that you schedule a follow-up appointment in: 6 MONTHS. No changes were made in your therapy today. 

## 2013-03-02 ENCOUNTER — Encounter: Payer: Self-pay | Admitting: Cardiovascular Disease

## 2013-03-02 NOTE — Progress Notes (Signed)
Patient ID: Denise [name], female   DOB: 19-Mar-1923, 79 y.o.   MRN: [id number]      HPI: Denise [name], is a 79 y.o. female who presents to the office today for six-month followup cardiology evaluation.   Denise [name] has a history of permanent atrial fibrillation and is status post AV node ablation by Dr. Severiano Gilbert and permanent pacemaker implantation in June 2006. She has a history of hypertensive cardiomyopathy. She has documented severe LA dilatation. On last echo in December 2012 ejection fraction was 45-50%. She had moderate to severe MR, moderate pulmonary hypertension a nodular sclerosis of the aortic valve the  She has a dual-chamber St. Jude identity permanent pacemaker. She last saw Dr. Royann Shivers for for pacemaker interrogation in October 2013.  In March 2014 she broke her right hip and her right arm and required surgery by Dr. Valentina Gu. After being hospitalized she went to a nursing facility .  She denies recent chest pain. She denies significant shortness of breath. At times she does note some mild leg swelling. She walks with a walker. She denies recent episodes of chest pain. She is unaware of palpitations. She denies presyncope or syncope.  Past Medical History  Diagnosis Date  . Diabetes mellitus without complication   . Thyroid disease   . Permanent atrial fibrillation   . Hypertension   . CHF (congestive heart failure)   . LVH (left ventricular hypertrophy)   . Mitral regurgitation     mod to severe  . Tricuspid regurgitation     severe  . Pulmonary hypertension     severe    Past Surgical History  Procedure Laterality Date  . Pacemaker insertion  05/26/2004    St.Jude  . Abdominal hysterectomy    . Tubal ligation    . Cholecystectomy    . Thyroidectomy    . Knee replacement      R  . Compression hip screw Right 06/06/2012    Procedure: Open Reduction Internal Fixation Right HIp;  Surgeon: Dannielle Huh, MD;  Location: [facility] OR;  Service: Orthopedics;  Laterality:  Right;  . Cardioversion  5 /18/ 2000    unsuccessful  . US echocardiography  02/25/2011    mod to severe MR,LA severe dilated,mild annular ca+,mod. TR,mod PI,mod ca+ of the aortic valve leaflet  . Nm myocar perf wall motion  02/28/2009    No signficant ischemia    Allergies  Allergen Reactions  . Iohexol Rash     Desc: VOMITING-VERIFIED ON 05-24-04 PRE-PROCEDURE/ ARS   . Shrimp [Shellfish Allergy] Nausea And Vomiting and Rash    Current Outpatient Prescriptions  Medication Sig Dispense Refill  . amLODipine (NORVASC) 5 MG tablet Take 5 mg by mouth every morning.      [name] atorvastatin (LIPITOR) 10 MG tablet Take 10 mg by mouth every evening.      . fish oil-omega-3 fatty acids 1000 MG capsule Take 1 g by mouth every morning.      [name] glipiZIDE (GLUCOTROL) 10 MG tablet Take 10 mg by mouth daily.       [name] glucosamine-chondroitin 500-400 MG tablet Take 1 tablet by mouth as needed.       . hydrochlorothiazide (HYDRODIURIL) 25 MG tablet Take 25 mg by mouth every morning.      . isosorbide mononitrate (IMDUR) 30 MG 24 hr tablet Take 30 mg by mouth every morning.      . methimazole (TAPAZOLE) 10 MG tablet Take 10 mg by mouth every morning.      [name]  metoprolol succinate (TOPROL-XL) 50 MG 24 hr tablet Take 1 tablet (50 mg total) by mouth daily. Take with or immediately following a meal.  90 tablet  2  . ramipril (ALTACE) 10 MG capsule Take 10 mg by mouth every morning.      . warfarin (COUMADIN) 5 MG tablet Take 5-7.5 mg by mouth daily. Take 7.5mg  on Monday. Take 5mg  all other days of the week       No current facility-administered medications for this visit.    Socially she is widowed and has 4 children 4 grandchildren 4 great-grandchildren. There is no tobacco or alcohol use.  ROS is negative for fever chills or night sweats. She denies skin rash. She denies change in vision or hearing. She is unaware of lymphadenopathy. She denies significant shortness of breath. There is no PND or orthopnea. He  is not active. She walks with a walker. There is no chest pressure. She denies syncope. She denies blood in her stool or urine. She denies change in bowel habits. She denies myalgias. At times there is some mild swelling. She has tolerated lipid-lowering therapy. There is diabetes.  There is no awareness of thyroid problems. She denies seizures. Other comprehensive 14 point system review is negative.  PE BP 130/66  Ht 5\' 2"  (1.575 m)  Wt 155 lb 9.6 oz (70.58 kg)  BMI 28.45 kg/m2  General: Alert, oriented, no distress.  Skin: normal turgor, no rashes HEENT: Normocephalic, atraumatic. Pupils round and reactive; sclera anicteric;no lid lag,  Nose without nasal septal hypertrophy Mouth/Parynx benign; Mallinpatti scale 3 Neck: No JVD, no carotid briuts Lungs: clear to ausculatation and percussion; no wheezing or rales Chest wall: No tenderness to palpation Heart: RRR, s1 s2 normal 2/6 systolic murmur. Abdomen: Moderate central adiposity. soft, nontender; no hepatosplenomehaly, BS+; abdominal aorta nontender and not dilated by palpation. Back: No CVA tenderness Pulses 2+ Extremities: Trace bilateral ankle edema. no clubbing cyanosis., Homan's sign negative  Neurologic: grossly nonfocal Psychological: Normal affect and mood  ECG: Underlying atrial fibrillation with ventricular paced rhythm at 70 beats per minute with 100% capture.  LABS:  BMET    Component Value Date/Time   NA 133* 06/08/2012 0554   K 3.6 06/08/2012 0554   CL 96 06/08/2012 0554   CO2 28 06/08/2012 0554   GLUCOSE 161* 06/08/2012 0554   BUN 37* 06/08/2012 0554   CREATININE 1.16* 06/08/2012 0554   CALCIUM 8.5 06/08/2012 0554   GFRNONAA 41* 06/08/2012 0554   GFRAA 48* 06/08/2012 0554     Hepatic Function Panel     Component Value Date/Time   ALBUMIN 3.1* 06/05/2012 1045     CBC    Component Value Date/Time   WBC 9.2 06/09/2012 0500   RBC 3.36* 06/09/2012 0500   HGB 9.6* 06/09/2012 0500   HCT 28.5* 06/09/2012 0500   PLT  182 06/09/2012 0500   MCV 84.8 06/09/2012 0500   MCH 28.6 06/09/2012 0500   MCHC 33.7 06/09/2012 0500   RDW 16.3* 06/09/2012 0500   LYMPHSABS 1.4 06/05/2012 0135   MONOABS 0.9 06/05/2012 0135   EOSABS 0.0 06/05/2012 0135   BASOSABS 0.0 06/05/2012 0135     BNP    Component Value Date/Time   PROBNP 2182.0* 06/07/2012 0923    Lipid Panel  No results found for this basename: chol,  trig,  hdl,  cholhdl,  vldl,  ldlcalc     RADIOLOGY: No results found.    ASSESSMENT AND PLAN: Clinically, Ms. Roppolo seems to be  improved. On exam she does not have any findings suggestive of heart failure. Her blood pressure is well controlled and when rechecked by me was 120/68. She has normal pacemaker function and is 100% paced and has been documented to have underlying complete heart block. She last saw Dr. Royann Shivers in August 2014 for pacemaker evaluation. She is on Coumadin and INR will be checked. As long as she remains stable I will see her in 6 months for cardiology reevaluation.     Lennette Bihari, MD, [facility]  03/02/2013 9:21 PM

## 2013-03-03 ENCOUNTER — Encounter: Payer: Self-pay | Admitting: Cardiovascular Disease

## 2013-03-30 LAB — PROTIME-INR: INR: 1.9 — AB (ref <=1.1)

## 2013-03-31 ENCOUNTER — Ambulatory Visit (INDEPENDENT_AMBULATORY_CARE_PROVIDER_SITE_OTHER): Payer: Medicare Other | Admitting: Pharmacist Clinician (PhC)/ Clinical Pharmacy Specialist

## 2013-03-31 DIAGNOSIS — I4891 Unspecified atrial fibrillation: Secondary | ICD-10-CM

## 2013-03-31 DIAGNOSIS — Z7901 Long term (current) use of anticoagulants: Secondary | ICD-10-CM

## 2013-04-06 ENCOUNTER — Other Ambulatory Visit: Payer: Self-pay | Admitting: *Deleted

## 2013-04-06 MED ORDER — AMLODIPINE BESYLATE 5 MG PO TABS
5.0000 mg | ORAL_TABLET | Freq: Every morning | ORAL | Status: DC
Start: 2013-04-06 — End: 2013-09-25

## 2013-05-10 ENCOUNTER — Ambulatory Visit: Payer: Medicare Other | Admitting: Pharmacist Clinician (PhC)/ Clinical Pharmacy Specialist

## 2013-05-10 ENCOUNTER — Encounter: Payer: Medicare Other | Admitting: Cardiovascular Disease

## 2013-05-10 ENCOUNTER — Other Ambulatory Visit: Payer: Self-pay | Admitting: Pharmacist Clinician (PhC)/ Clinical Pharmacy Specialist

## 2013-05-10 MED ORDER — WARFARIN SODIUM 5 MG PO TABS: ORAL_TABLET | ORAL | Status: DC

## 2013-05-12 ENCOUNTER — Other Ambulatory Visit: Payer: Self-pay | Admitting: *Deleted

## 2013-05-17 LAB — PROTIME-INR: INR: 1.9 — AB (ref <=1.1)

## 2013-05-18 ENCOUNTER — Ambulatory Visit (INDEPENDENT_AMBULATORY_CARE_PROVIDER_SITE_OTHER): Payer: Medicare Other | Admitting: Pharmacist Clinician (PhC)/ Clinical Pharmacy Specialist

## 2013-05-18 DIAGNOSIS — Z7901 Long term (current) use of anticoagulants: Secondary | ICD-10-CM

## 2013-05-18 DIAGNOSIS — I4891 Unspecified atrial fibrillation: Secondary | ICD-10-CM

## 2013-05-18 MED ORDER — WARFARIN SODIUM 5 MG PO TABS: ORAL_TABLET | ORAL | Status: DC

## 2013-05-31 ENCOUNTER — Telehealth: Payer: Self-pay | Admitting: Cardiovascular Disease

## 2013-05-31 NOTE — Telephone Encounter (Signed)
Need refill on her Isosorbide ER 30 mg #90

## 2013-05-31 NOTE — Telephone Encounter (Signed)
Returned call.  Asked that hardy copy request be faxed to [phone] for refills.  Verbalized understanding and agreed w/ plan.

## 2013-06-01 ENCOUNTER — Telehealth: Payer: Self-pay | Admitting: Cardiovascular Disease

## 2013-06-01 MED ORDER — ISOSORBIDE MONONITRATE ER 30 MG PO TB24: 30.0000 mg | ORAL_TABLET | Freq: Every morning | ORAL | Status: DC

## 2013-06-01 NOTE — Telephone Encounter (Signed)
Returning your call from yesterday. °

## 2013-06-01 NOTE — Telephone Encounter (Signed)
Refill(s) sent to pharmacy: isosorbide.

## 2013-06-01 NOTE — Telephone Encounter (Signed)
Wrong doctor was put on previous message,it should be Dr Tresa Endo instead of Dr Royann Shivers.[name]

## 2013-06-01 NOTE — Telephone Encounter (Signed)
Already addressed refill.

## 2013-06-15 ENCOUNTER — Encounter: Payer: Self-pay | Admitting: Cardiovascular Disease

## 2013-06-15 ENCOUNTER — Ambulatory Visit (INDEPENDENT_AMBULATORY_CARE_PROVIDER_SITE_OTHER): Payer: Medicare Other | Admitting: Cardiovascular Disease

## 2013-06-15 ENCOUNTER — Ambulatory Visit (INDEPENDENT_AMBULATORY_CARE_PROVIDER_SITE_OTHER): Payer: Medicare Other | Admitting: *Deleted

## 2013-06-15 VITALS — BP 122/76 | HR 70 | Resp 20 | Ht 61.0 in | Wt 156.5 lb

## 2013-06-15 DIAGNOSIS — I4891 Unspecified atrial fibrillation: Secondary | ICD-10-CM

## 2013-06-15 DIAGNOSIS — I442 Atrioventricular block, complete: Secondary | ICD-10-CM

## 2013-06-15 DIAGNOSIS — Z5181 Encounter for therapeutic drug level monitoring: Secondary | ICD-10-CM

## 2013-06-15 DIAGNOSIS — Z7901 Long term (current) use of anticoagulants: Secondary | ICD-10-CM

## 2013-06-15 DIAGNOSIS — Z950 Presence of cardiac pacemaker: Secondary | ICD-10-CM

## 2013-06-15 LAB — PACEMAKER DEVICE OBSERVATION

## 2013-06-15 LAB — POCT INR: INR: 2.4

## 2013-06-15 NOTE — Patient Instructions (Signed)
Your physician recommends that you schedule a follow-up appointment in: 6 months with Dr.Croitoru + pacemaker check   

## 2013-06-16 ENCOUNTER — Ambulatory Visit: Payer: Medicare Other | Admitting: Cardiovascular Disease

## 2013-06-16 LAB — MDC_IDC_ENUM_SESS_TYPE_INCLINIC
Battery Impedance: 2400 Ohm
Brady Statistic RV Percent Paced: 99 %
Date Time Interrogation Session: 20150402143950
Implantable Pulse Generator Model: 5386
Lead Channel Pacing Threshold Amplitude: 0.5 V
Lead Channel Setting Pacing Pulse Width: 0.5 ms
MDC IDC MSMT BATTERY VOLTAGE: 2.79 V
MDC IDC MSMT LEADCHNL RV IMPEDANCE VALUE: 499 Ohm
MDC IDC MSMT LEADCHNL RV PACING THRESHOLD PULSEWIDTH: 0.5 ms
MDC IDC PG SERIAL: 1457119
MDC IDC SET LEADCHNL RV SENSING SENSITIVITY: 2 mV

## 2013-07-02 ENCOUNTER — Encounter: Payer: Self-pay | Admitting: Cardiovascular Disease

## 2013-07-02 NOTE — Progress Notes (Signed)
Patient ID: Denise [name], female   DOB: 22-Feb-1924, 80 y.o.   MRN: [id number]     Reason for office visit Pacemaker followup, permanent atrial fibrillation, complete heart block status post AV node ablation.  Denise [name] is an 80 year old patient of Dr. Nicki Guadalajara with a history of persistent atrial fibrillation status post AV node ablation and implantation of a pacemaker. The device is a Engineer, water. Jude identity implanted in 2006. It is a dual chamber device that is now programmed VVIR for permanent atrial fibrillation. She has a severely dilated left atrium, left ventricular ejection fraction of 45-50% and an estimated systolic PA pressure of 50 mm Hg. She is felt to have nonischemic cardiomyopathy. She is on chronic warfarin anticoagulation without a history of TIA/stroke or other embolic events. She has not had any bleeding complications. Progress interrogation of her pacemaker today shows normal function. No reprogramming changes were necessary.   Allergies  Allergen Reactions  . Iohexol Rash     Desc: VOMITING-VERIFIED ON 05-24-04 PRE-PROCEDURE/ ARS   . Shrimp [Shellfish Allergy] Nausea And Vomiting and Rash    Current Outpatient Prescriptions  Medication Sig Dispense Refill  . amLODipine (NORVASC) 5 MG tablet Take 1 tablet (5 mg total) by mouth every morning.  30 tablet  9  . atorvastatin (LIPITOR) 10 MG tablet Take 10 mg by mouth every evening.      . fish oil-omega-3 fatty acids 1000 MG capsule Take 1 g by mouth every morning.      . hydrochlorothiazide (HYDRODIURIL) 25 MG tablet Take 25 mg by mouth every morning.      . isosorbide mononitrate (IMDUR) 30 MG 24 hr tablet Take 1 tablet (30 mg total) by mouth every morning.  30 tablet  2  . methimazole (TAPAZOLE) 10 MG tablet Take 10 mg by mouth every morning.      . metoprolol succinate (TOPROL-XL) 50 MG 24 hr tablet Take 1 tablet (50 mg total) by mouth daily. Take with or immediately following a meal.  90 tablet  2  . ramipril  (ALTACE) 10 MG capsule Take 10 mg by mouth every morning.      . warfarin (COUMADIN) 5 MG tablet Take 1 to 1 & 1/2 tablets by mouth daily as directed  105 tablet  0   No current facility-administered medications for this visit.    Past Medical History  Diagnosis Date  . Diabetes mellitus without complication   . Thyroid disease   . Permanent atrial fibrillation   . Hypertension   . CHF (congestive heart failure)   . LVH (left ventricular hypertrophy)   . Mitral regurgitation     mod to severe  . Tricuspid regurgitation     severe  . Pulmonary hypertension     severe    Past Surgical History  Procedure Laterality Date  . Pacemaker insertion  05/26/2004    St.Jude  . Abdominal hysterectomy    . Tubal ligation    . Cholecystectomy    . Thyroidectomy    . Knee replacement      R  . Compression hip screw Right 06/06/2012    Procedure: Open Reduction Internal Fixation Right HIp;  Surgeon: Dannielle Huh, MD;  Location: [facility];  Service: Orthopedics;  Laterality: Right;  . Cardioversion  5 /18/ 2000    unsuccessful  . US echocardiography  02/25/2011    mod to severe MR,LA severe dilated,mild annular ca+,mod. TR,mod PI,mod ca+ of the aortic valve leaflet  . Nm myocar  perf wall motion  02/28/2009    No signficant ischemia    Family History  Problem Relation Age of Onset  . Asthma Mother   . Heart attack Father   . Stroke Sister   . Heart attack Brother   . Cancer - Colon Brother   . Gallbladder disease Maternal Grandmother   . Stroke Maternal Grandfather   . Heart disease Child     History   Social History  . Marital Status: Widowed    Spouse Name: N/A    Number of Children: N/A  . Years of Education: N/A   Occupational History  . Not on file.   Social History Main Topics  . Smoking status: Never Smoker   . Smokeless tobacco: Not on file  . Alcohol Use: No  . Drug Use: No  . Sexual Activity: No   Other Topics Concern  . Not on file   Social History  Narrative  . No narrative on file    Review of systems: The patient specifically denies any chest pain at rest or with exertion, dyspnea at rest or with exertion, orthopnea, paroxysmal nocturnal dyspnea, syncope, palpitations, focal neurological deficits, intermittent claudication, lower extremity edema, unexplained weight gain, cough, hemoptysis or wheezing.  The patient also denies abdominal pain, nausea, vomiting, dysphagia, diarrhea, constipation, polyuria, polydipsia, dysuria, hematuria, frequency, urgency, abnormal bleeding or bruising, fever, chills, unexpected weight changes, mood swings, change in skin or hair texture, change in voice quality, auditory or visual problems, allergic reactions or rashes, new musculoskeletal complaints other than usual "aches and pains".   PHYSICAL EXAM BP 122/76  Pulse 70  Resp 20  Ht 5\' 1"  (1.549 m)  Wt 156 lb 8 oz (70.988 kg)  BMI 29.59 kg/m2  General: Alert, oriented x3, no distress Head: no evidence of trauma, PERRL, EOMI, no exophtalmos or lid lag, no myxedema, no xanthelasma; normal ears, nose and oropharynx Neck: normal jugular venous pulsations and no hepatojugular reflux; brisk carotid pulses without delay and no carotid bruits Chest: clear to auscultation, no signs of consolidation by percussion or palpation, normal fremitus, symmetrical and full respiratory excursions Cardiovascular: normal position and quality of the apical impulse, regular rhythm, normal first and second heart sounds, no murmurs, rubs or gallops Abdomen: no tenderness or distention, no masses by palpation, no abnormal pulsatility or arterial bruits, normal bowel sounds, no hepatosplenomegaly Extremities: no clubbing, cyanosis or edema; 2+ radial, ulnar and brachial pulses bilaterally; 2+ right femoral, posterior tibial and dorsalis pedis pulses; 2+ left femoral, posterior tibial and dorsalis pedis pulses; no subclavian or femoral bruits Neurological: grossly  nonfocal   EKG: Atrial fibrillation with 100%ventricular pacing  BMET    Component Value Date/Time   NA 133* 06/08/2012 0554   K 3.6 06/08/2012 0554   CL 96 06/08/2012 0554   CO2 28 06/08/2012 0554   GLUCOSE 161* 06/08/2012 0554   BUN 37* 06/08/2012 0554   CREATININE 1.16* 06/08/2012 0554   CALCIUM 8.5 06/08/2012 0554   GFRNONAA 41* 06/08/2012 0554   GFRAA 48* 06/08/2012 0554     ASSESSMENT AND PLAN We'll pacemaker function. She is pacemaker dependent following AV node ablation. There is no escape rhythm. Battery longevity is estimated to be around 5 years. Normal lead parameters and good heart rate histogram distribution. Her devices not amenable to all monitoring and she will return to the office for checks every 6 months   Patient Instructions  Your physician recommends that you schedule a follow-up appointment in: 6 months with  Dr.Cortlan Dolin + pacemaker check    Orders Placed This Encounter  Procedures  . Implantable device check  . EKG 12-Lead   No orders of the defined types were placed in this encounter.    Denise [name]  Thurmon FairMihai Nylen Creque, MD, [facility] [phone] office [phone] pager

## 2013-08-03 LAB — PROTIME-INR

## 2013-08-04 ENCOUNTER — Ambulatory Visit (INDEPENDENT_AMBULATORY_CARE_PROVIDER_SITE_OTHER): Payer: Medicare Other | Admitting: Pharmacist Clinician (PhC)/ Clinical Pharmacy Specialist

## 2013-08-04 DIAGNOSIS — Z7901 Long term (current) use of anticoagulants: Secondary | ICD-10-CM

## 2013-08-04 DIAGNOSIS — I4891 Unspecified atrial fibrillation: Secondary | ICD-10-CM

## 2013-08-28 ENCOUNTER — Other Ambulatory Visit: Payer: Self-pay

## 2013-08-28 MED ORDER — ISOSORBIDE MONONITRATE ER 30 MG PO TB24: 30.0000 mg | ORAL_TABLET | Freq: Every morning | ORAL | Status: DC

## 2013-08-28 NOTE — Telephone Encounter (Signed)
Rx was sent to pharmacy electronically. 

## 2013-09-25 ENCOUNTER — Other Ambulatory Visit: Payer: Self-pay

## 2013-09-25 MED ORDER — AMLODIPINE BESYLATE 5 MG PO TABS: 5.0000 mg | ORAL_TABLET | Freq: Every morning | ORAL | Status: DC

## 2013-09-25 NOTE — Telephone Encounter (Signed)
Rx was sent to pharmacy electronically. 

## 2013-09-27 ENCOUNTER — Ambulatory Visit (INDEPENDENT_AMBULATORY_CARE_PROVIDER_SITE_OTHER): Payer: Medicare Other | Admitting: Pharmacist

## 2013-09-27 DIAGNOSIS — Z7901 Long term (current) use of anticoagulants: Secondary | ICD-10-CM

## 2013-09-27 DIAGNOSIS — I4891 Unspecified atrial fibrillation: Secondary | ICD-10-CM

## 2013-09-27 LAB — PROTIME-INR: INR: 1.9 — AB (ref 0.9–1.1)

## 2013-10-16 ENCOUNTER — Other Ambulatory Visit: Payer: Self-pay | Admitting: Pharmacist Clinician (PhC)/ Clinical Pharmacy Specialist

## 2013-10-16 MED ORDER — WARFARIN SODIUM 5 MG PO TABS: ORAL_TABLET | ORAL | Status: DC

## 2013-10-24 ENCOUNTER — Encounter: Payer: Self-pay | Admitting: Cardiovascular Disease

## 2013-11-01 ENCOUNTER — Other Ambulatory Visit: Payer: Self-pay | Admitting: *Deleted

## 2013-11-01 MED ORDER — RAMIPRIL 10 MG PO CAPS: 10.0000 mg | ORAL_CAPSULE | Freq: Every morning | ORAL | Status: DC

## 2013-11-10 ENCOUNTER — Ambulatory Visit (INDEPENDENT_AMBULATORY_CARE_PROVIDER_SITE_OTHER): Payer: Medicare Other | Admitting: Cardiovascular Disease

## 2013-11-10 ENCOUNTER — Encounter: Payer: Self-pay | Admitting: Cardiovascular Disease

## 2013-11-10 ENCOUNTER — Ambulatory Visit (INDEPENDENT_AMBULATORY_CARE_PROVIDER_SITE_OTHER): Payer: Medicare Other | Admitting: Pharmacist Clinician (PhC)/ Clinical Pharmacy Specialist

## 2013-11-10 VITALS — BP 130/70 | HR 70 | Ht 60.0 in | Wt 163.4 lb

## 2013-11-10 DIAGNOSIS — E782 Mixed hyperlipidemia: Secondary | ICD-10-CM

## 2013-11-10 DIAGNOSIS — Z7901 Long term (current) use of anticoagulants: Secondary | ICD-10-CM

## 2013-11-10 DIAGNOSIS — I4891 Unspecified atrial fibrillation: Secondary | ICD-10-CM

## 2013-11-10 DIAGNOSIS — E119 Type 2 diabetes mellitus without complications: Secondary | ICD-10-CM

## 2013-11-10 DIAGNOSIS — I10 Essential (primary) hypertension: Secondary | ICD-10-CM

## 2013-11-10 DIAGNOSIS — I480 Paroxysmal atrial fibrillation: Secondary | ICD-10-CM

## 2013-11-10 DIAGNOSIS — Z950 Presence of cardiac pacemaker: Secondary | ICD-10-CM

## 2013-11-10 DIAGNOSIS — I428 Other cardiomyopathies: Secondary | ICD-10-CM

## 2013-11-10 LAB — POCT INR: INR: 2.5

## 2013-11-10 NOTE — Progress Notes (Signed)
Patient ID: Denise [name], female   DOB: 11/30/1923, 79 y.o.   MRN: [id number]      HPI: Denise [name] is a 80 y.o. female who presents to the office today for 58-month followup cardiology evaluation.   Ms. Katich has a history of permanent atrial fibrillation and is status post AV node ablation by Dr. Severiano Gilbert and permanent pacemaker implantation in June 2006. She has a history of hypertensive cardiomyopathy. She has documented severe LA dilatation. On last echo in December 2012 ejection fraction was 45-50%. She had moderate to severe MR, moderate pulmonary hypertension a nodular sclerosis of the aortic valve the  She has a dual-chamber St. Jude identity permanent pacemaker. She last saw Dr. Royann Shivers for for pacemaker interrogation on June 15, 2013 at that time, and pacemaker interrogation revealed normal function, and no reprogramming changes were necessary.  In March 2014 she broke her right hip and her right arm and required surgery by Dr. Valentina Gu. After being hospitalized she went to a nursing facility .  Since I last saw her she is unaware of any significant palpitations.  She is on Coumadin for anticoagulation and denies receding.  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  She denies recent chest pain. She denies significant shortness of breath. At times she does note some mild leg swelling. She walks with a walker. She denies recent episodes of chest pain.  She denies presyncope or syncope.  She presents for followup evaluation.  Past Medical History  Diagnosis Date  . Diabetes mellitus without complication   . Thyroid disease   . Permanent atrial fibrillation   . Hypertension   . CHF (congestive heart failure)   . LVH (left ventricular hypertrophy)   . Mitral regurgitation     mod to severe  . Tricuspid regurgitation     severe  . Pulmonary hypertension     severe    Past Surgical History  Procedure Laterality Date  . Pacemaker  insertion  05/26/2004    St.Jude  . Abdominal hysterectomy    . Tubal ligation    . Cholecystectomy    . Thyroidectomy    . Knee replacement      R  . Compression hip screw Right 06/06/2012    Procedure: Open Reduction Internal Fixation Right HIp;  Surgeon: Dannielle Huh, MD;  Location: [facility] OR;  Service: Orthopedics;  Laterality: Right;  . Cardioversion  5 /18/ 2000    unsuccessful  . US echocardiography  02/25/2011    mod to severe MR,LA severe dilated,mild annular ca+,mod. TR,mod PI,mod ca+ of the aortic valve leaflet  . Nm myocar perf wall motion  02/28/2009    No signficant ischemia    Allergies  Allergen Reactions  . Iohexol Rash     Desc: VOMITING-VERIFIED ON 05-24-04 PRE-PROCEDURE/ ARS   . Shrimp [Shellfish Allergy] Nausea And Vomiting and Rash    Current Outpatient Prescriptions  Medication Sig Dispense Refill  . amLODipine (NORVASC) 5 MG tablet Take 1 tablet (5 mg total) by mouth every morning.  30 tablet  9  . atorvastatin (LIPITOR) 10 MG tablet Take 10 mg by mouth every evening.      . fish oil-omega-3 fatty acids 1000 MG capsule Take 1 g by mouth every morning.      . hydrochlorothiazide (HYDRODIURIL) 25 MG tablet Take 25 mg by mouth every morning.      . isosorbide mononitrate (IMDUR) 30 MG 24 hr tablet  Take 1 tablet (30 mg total) by mouth every morning.  30 tablet  10  . methimazole (TAPAZOLE) 10 MG tablet Take 10 mg by mouth every morning.      . metoprolol succinate (TOPROL-XL) 50 MG 24 hr tablet Take 1 tablet (50 mg total) by mouth daily. Take with or immediately following a meal.  90 tablet  2  . OVER THE COUNTER MEDICATION Take 1 tablet by mouth daily. OTC Potassium      . ramipril (ALTACE) 10 MG capsule Take 1 capsule (10 mg total) by mouth every morning.  30 capsule  6  . warfarin (COUMADIN) 5 MG tablet Take 1 to 1 & 1/2 tablets by mouth daily as directed  105 tablet  0   No current facility-administered medications for this visit.    Socially she is widowed  and has 4 children 4 grandchildren 4 great-grandchildren. There is no tobacco or alcohol use.  ROS General: Negative; No fevers, chills, or night sweats;  HEENT: Negative; No changes in vision or hearing, sinus congestion, difficulty swallowing Pulmonary: Negative; No cough, wheezing, shortness of breath, hemoptysis Cardiovascular: Negative; No chest pain, presyncope, syncope, palpitations Mild intermittent lower extremity edema GI: Negative; No nausea, vomiting, diarrhea, or abdominal pain GU: Negative; No dysuria, hematuria, or difficulty voiding Musculoskeletal: Negative; no myalgias, joint pain, or weakness Hematologic/Oncology: Negative; no easy bruising, bleeding Endocrine: Positive for diabetes mellitus and thyroid disease. Neuro: Negative; no changes in balance, headaches Skin: Negative; No rashes or skin lesions Psychiatric: Negative; No behavioral problems, depression Sleep: Negative; No snoring, daytime sleepiness, hypersomnolence, bruxism, restless legs, hypnogognic hallucinations, no cataplexy Other comprehensive 14 point system review is negative.  PE BP 130/70  Pulse 70  Ht 5' (1.524 m)  Wt 163 lb 6.4 oz (74.118 kg)  BMI 31.91 kg/m2  General: Alert, oriented, no distress.  Skin: normal turgor, no rashes HEENT: Normocephalic, atraumatic. Pupils round and reactive; sclera anicteric;no lid lag,  Nose without nasal septal hypertrophy Mouth/Parynx benign; Mallinpatti scale 3 Neck: No JVD, no carotid bruits with normal carotid upstroke Lungs: clear to ausculatation and percussion; no wheezing or rales Chest wall: No tenderness to palpation Heart: RRR, s1 s2 normal 2/6 systolic murmur. Abdomen: Moderate central adiposity. soft, nontender; no hepatosplenomehaly, BS+; abdominal aorta nontender and not dilated by palpation. Back: No CVA tenderness Pulses 2+ Extremities: Trace bilateral ankle edema. no clubbing cyanosis., Homan's sign negative  Neurologic: grossly  nonfocal Psychological: Normal affect and mood  ECG (independently read by me): Underlying atrial fibrillation with ventricular paced rhythm at 70 beats per minute with 100% capture.  LABS:  BMET    Component Value Date/Time   NA 133* 06/08/2012 0554   K 3.6 06/08/2012 0554   CL 96 06/08/2012 0554   CO2 28 06/08/2012 0554   GLUCOSE 161* 06/08/2012 0554   BUN 37* 06/08/2012 0554   CREATININE 1.16* 06/08/2012 0554   CALCIUM 8.5 06/08/2012 0554   GFRNONAA 41* 06/08/2012 0554   GFRAA 48* 06/08/2012 0554     Hepatic Function Panel     Component Value Date/Time   ALBUMIN 3.1* 06/05/2012 1045     CBC    Component Value Date/Time   WBC 9.2 06/09/2012 0500   RBC 3.36* 06/09/2012 0500   HGB 9.6* 06/09/2012 0500   HCT 28.5* 06/09/2012 0500   PLT 182 06/09/2012 0500   MCV 84.8 06/09/2012 0500   MCH 28.6 06/09/2012 0500   MCHC 33.7 06/09/2012 0500   RDW 16.3* 06/09/2012 0500  LYMPHSABS 1.4 06/05/2012 0135   MONOABS 0.9 06/05/2012 0135   EOSABS 0.0 06/05/2012 0135   BASOSABS 0.0 06/05/2012 0135     BNP    Component Value Date/Time   PROBNP 2182.0* 06/07/2012 0923    Lipid Panel  No results found for this basename: chol,  trig,  hdl,  cholhdl,  vldl,  ldlcalc     RADIOLOGY: No results found.    ASSESSMENT AND PLAN: Ms. Belleville is an 80 year old female who has a history of permanent atrial fibrillation and is status post AV node ablation and implantation of a St. Jude identity dual-chamber pacemaker, which is now programmed to VVIR mode in light of her atrial fibrillation.  She is 100% paced.  Her last pacemaker interrogation showed normal function and no reprogramming was necessary.  She does have a history of hypertension and her blood pressure today on repeat by me was 118/70.  Her medical regimen consisting of Ramipril 10 mg, Toprol-XL 50 mg, HCTZ 25 mg and amlodipine 5 mg.  She is on Lipitor 10 mg for hyperlipidemia.  She does not have any edema presently.  She's not having any chest  pain.  She has documented moderately severe MR, moderate pulmonary hypertension and aortic valve sclerosis with nodular sclerosis.  Her INR was checked today and this was therapeutic on her current dose of Coumadin.  I recommend a complete set of blood work obtained in the fasting state and adjustments will be made if necessary to medical regimen .  If necessary.  I will see her in one year for followup cardiology evaluation.   Lennette Bihari, MD, [facility]  11/10/2013 11:39 AM

## 2013-11-10 NOTE — Patient Instructions (Signed)
Your physician recommends that you return for lab work fasting. Do not eat or drink after midnight the night before going to the lab.  Your physician wants you to follow-up in: 1 year You will receive a reminder letter in the mail two months in advance. If you don't receive a letter, please call our office to schedule the follow-up appointment.

## 2013-11-13 ENCOUNTER — Other Ambulatory Visit: Payer: Self-pay | Admitting: *Deleted

## 2013-11-13 MED ORDER — METOPROLOL SUCCINATE ER 50 MG PO TB24
50.0000 mg | ORAL_TABLET | Freq: Every day | ORAL | Status: DC
Start: 2013-11-13 — End: 2015-01-16

## 2013-11-13 NOTE — Telephone Encounter (Signed)
Rx was sent to pharmacy electronically. 

## 2013-12-25 ENCOUNTER — Other Ambulatory Visit: Payer: Self-pay

## 2014-01-12 ENCOUNTER — Ambulatory Visit (INDEPENDENT_AMBULATORY_CARE_PROVIDER_SITE_OTHER): Payer: Medicare Other | Admitting: Pharmacist Clinician (PhC)/ Clinical Pharmacy Specialist

## 2014-01-12 ENCOUNTER — Encounter: Payer: Self-pay | Admitting: Cardiovascular Disease

## 2014-01-12 ENCOUNTER — Ambulatory Visit (INDEPENDENT_AMBULATORY_CARE_PROVIDER_SITE_OTHER): Payer: Medicare Other | Admitting: Cardiovascular Disease

## 2014-01-12 VITALS — BP 131/71 | HR 69 | Resp 16 | Ht 60.0 in | Wt 164.8 lb

## 2014-01-12 DIAGNOSIS — Z7901 Long term (current) use of anticoagulants: Secondary | ICD-10-CM

## 2014-01-12 DIAGNOSIS — I442 Atrioventricular block, complete: Secondary | ICD-10-CM

## 2014-01-12 DIAGNOSIS — Z950 Presence of cardiac pacemaker: Secondary | ICD-10-CM

## 2014-01-12 DIAGNOSIS — I482 Chronic atrial fibrillation, unspecified: Secondary | ICD-10-CM

## 2014-01-12 DIAGNOSIS — I4891 Unspecified atrial fibrillation: Secondary | ICD-10-CM

## 2014-01-12 LAB — MDC_IDC_ENUM_SESS_TYPE_INCLINIC
Battery Voltage: 2.76 V
Date Time Interrogation Session: 20151030110206
Implantable Pulse Generator Model: 5386
Implantable Pulse Generator Serial Number: 1457119
Lead Channel Pacing Threshold Amplitude: 0.375 V
Lead Channel Pacing Threshold Pulse Width: 0.5 ms
Lead Channel Setting Pacing Pulse Width: 0.5 ms
Lead Channel Setting Sensing Sensitivity: 2 mV
MDC IDC MSMT BATTERY IMPEDANCE: 2700 Ohm
MDC IDC MSMT LEADCHNL RV IMPEDANCE VALUE: 507 Ohm

## 2014-01-12 LAB — POCT INR: INR: 3

## 2014-01-12 NOTE — Patient Instructions (Signed)
Your physician recommends that you schedule a follow-up appointment in: 6 MONTHS WITH DR.CROITORU + PACEMAKER CHECK

## 2014-01-12 NOTE — Progress Notes (Signed)
Patient ID: Denise [name], female   DOB: [birth year], 80 y.o.   MRN: [id number]     Reason for office visit Pacemaker followup, permanent atrial fibrillation, complete heart block status post AV node ablation.   Denise [name] is an 80 year old patient of Dr. Nicki Guadalajara with a history of persistent atrial fibrillation status post AV node ablation and implantation of a pacemaker. The device is a Engineer, water. Jude identity implanted in 2006. It is a dual chamber device that is now programmed VVIR for permanent atrial fibrillation. She has a severely dilated left atrium, left ventricular ejection fraction of 45-50% and an estimated systolic PA pressure of 50 mm Hg. She is felt to have nonischemic cardiomyopathy. She is on chronic warfarin anticoagulation without a history of TIA/stroke or other embolic events. She has not had any bleeding complications. Comprehensive interrogation of her pacemaker today shows normal function. No reprogramming changes were necessary.  She is pacemaker dependent. No escape rhythm when pacing is taken down to 30 bpm. Lead parameters are excellent. No high ventricular rates recorded.  She does not have cardiac complaints. Major concerns revolve around her gait stability, especially since she takes anticoagulation.    Allergies  Allergen Reactions  . Iohexol Rash     Desc: VOMITING-VERIFIED ON 05-24-04 PRE-PROCEDURE/ ARS   . Shrimp [Shellfish Allergy] Nausea And Vomiting and Rash    Current Outpatient Prescriptions  Medication Sig Dispense Refill  . amLODipine (NORVASC) 5 MG tablet Take 1 tablet (5 mg total) by mouth every morning.  30 tablet  9  . atorvastatin (LIPITOR) 10 MG tablet Take 10 mg by mouth every evening.      . fish oil-omega-3 fatty acids 1000 MG capsule Take 1 g by mouth every morning.      . hydrochlorothiazide (HYDRODIURIL) 25 MG tablet Take 25 mg by mouth every morning.      . isosorbide mononitrate (IMDUR) 30 MG 24 hr tablet Take 1 tablet (30 mg  total) by mouth every morning.  30 tablet  10  . methimazole (TAPAZOLE) 10 MG tablet Take 10 mg by mouth every morning.      . metoprolol succinate (TOPROL-XL) 50 MG 24 hr tablet Take 1 tablet (50 mg total) by mouth daily. Take with or immediately following a meal.  90 tablet  3  . OVER THE COUNTER MEDICATION Take 1 tablet by mouth daily. OTC Potassium      . ramipril (ALTACE) 10 MG capsule Take 1 capsule (10 mg total) by mouth every morning.  30 capsule  6  . warfarin (COUMADIN) 5 MG tablet Take 1 to 1 & 1/2 tablets by mouth daily as directed  105 tablet  0   No current facility-administered medications for this visit.    Past Medical History  Diagnosis Date  . Diabetes mellitus without complication   . Thyroid disease   . Permanent atrial fibrillation   . Hypertension   . CHF (congestive heart failure)   . LVH (left ventricular hypertrophy)   . Mitral regurgitation     mod to severe  . Tricuspid regurgitation     severe  . Pulmonary hypertension     severe    Past Surgical History  Procedure Laterality Date  . Pacemaker insertion  05/26/2004    St.Jude  . Abdominal hysterectomy    . Tubal ligation    . Cholecystectomy    . Thyroidectomy    . Knee replacement      R  . Compression hip  screw Right 06/06/2012    Procedure: Open Reduction Internal Fixation Right HIp;  Surgeon: Dannielle HuhSteve Lucey, MD;  [facility];  Service: Orthopedics;  Laterality: Right;  . Cardioversion  5 /18/ 2000    unsuccessful  . [place] echocardiography  02/25/2011    mod to severe MR,LA severe dilated,mild annular ca+,mod. TR,mod PI,mod ca+ of the aortic valve leaflet  . Nm myocar perf wall motion  02/28/2009    No signficant ischemia    Family History  Problem Relation Age of Onset  . Asthma Mother   . Heart attack Father   . Stroke Sister   . Heart attack Brother   . Cancer - Colon Brother   . Gallbladder disease Maternal Grandmother   . Stroke Maternal Grandfather   . Heart disease Child      History   Social History  . Marital Status: Widowed    Spouse Name: N/A    Number of Children: N/A  . Years of Education: N/A   Occupational History  . Not on file.   Social History Main Topics  . Smoking status: Never Smoker   . Smokeless tobacco: Not on file  . Alcohol Use: No  . Drug Use: No  . Sexual Activity: No   Other Topics Concern  . Not on file   Social History Narrative  . No narrative on file    Review of systems: The patient specifically denies any chest pain at rest or with exertion, dyspnea at rest or with exertion, orthopnea, paroxysmal nocturnal dyspnea, syncope, palpitations, focal neurological deficits, intermittent claudication, lower extremity edema, unexplained weight gain, cough, hemoptysis or wheezing.  The patient also denies abdominal pain, nausea, vomiting, dysphagia, diarrhea, constipation, polyuria, polydipsia, dysuria, hematuria, frequency, urgency, abnormal bleeding or bruising, fever, chills, unexpected weight changes, mood swings, change in skin or hair texture, change in voice quality, auditory or visual problems, allergic reactions or rashes, new musculoskeletal complaints other than usual "aches and pains".   PHYSICAL EXAM BP 131/71  Pulse 69  Resp 16  Ht 5' (1.524 m)  Wt 164 lb 12.8 oz (74.753 kg)  BMI 32.19 kg/m2 General: Alert, oriented x3, no distress  Head: no evidence of trauma, PERRL, EOMI, no exophtalmos or lid lag, no myxedema, no xanthelasma; normal ears, nose and oropharynx  Neck: normal jugular venous pulsations and no hepatojugular reflux; brisk carotid pulses without delay and no carotid bruits  Chest: clear to auscultation, no signs of consolidation by percussion or palpation, normal fremitus, symmetrical and full respiratory excursions  Cardiovascular: normal position and quality of the apical impulse, regular rhythm, normal first and second heart sounds, no murmurs, rubs or gallops  Abdomen: no tenderness or  distention, no masses by palpation, no abnormal pulsatility or arterial bruits, normal bowel sounds, no hepatosplenomegaly  Extremities: no clubbing, cyanosis or edema; 2+ radial, ulnar and brachial pulses bilaterally; 2+ right femoral, posterior tibial and dorsalis pedis pulses; 2+ left femoral, posterior tibial and dorsalis pedis pulses; no subclavian or femoral bruits  Neurological: grossly nonfocal   EKG: Atrial fibrillation with 100%ventricular pacing  BMET    Component Value Date/Time   NA 133* 06/08/2012 0554   K 3.6 06/08/2012 0554   CL 96 06/08/2012 0554   CO2 28 06/08/2012 0554   GLUCOSE 161* 06/08/2012 0554   BUN 37* 06/08/2012 0554   CREATININE 1.16* 06/08/2012 0554   CALCIUM 8.5 06/08/2012 0554   GFRNONAA 41* 06/08/2012 0554   GFRAA 48* 06/08/2012 0554     ASSESSMENT  AND PLAN  Normal pacemaker function. Pacemaker dependent patient (AV node ablation). Permanent atrial fibrillation. Estimated battery longevity 4-5 0.25 years. Excellent lead parameters. Good heart rate histogram distribution. In office checks every 6 months.  Orders Placed This Encounter  Procedures  . Implantable device check   No orders of the defined types were placed in this encounter.    Junious Silk, MD, [facility] [phone] office [phone] pager

## 2014-01-19 ENCOUNTER — Telehealth: Payer: Self-pay | Admitting: Cardiovascular Disease

## 2014-01-19 MED ORDER — WARFARIN SODIUM 5 MG PO TABS: ORAL_TABLET | ORAL | Status: DC

## 2014-01-19 NOTE — Telephone Encounter (Signed)
Let patient know that I had sent in the Rx refill

## 2014-01-19 NOTE — Telephone Encounter (Signed)
Pt still have not received her Coumadin. Please call [facility] today,because she needs it this weekend.Please call her,if you call it in .

## 2014-01-22 ENCOUNTER — Encounter: Payer: Self-pay | Admitting: Cardiovascular Disease

## 2014-03-13 ENCOUNTER — Encounter: Payer: Self-pay | Admitting: Cardiovascular Disease

## 2014-04-03 ENCOUNTER — Encounter: Payer: Self-pay | Admitting: Cardiovascular Disease

## 2014-04-10 ENCOUNTER — Telehealth: Payer: Self-pay | Admitting: Pharmacist Clinician (PhC)/ Clinical Pharmacy Specialist

## 2014-04-10 NOTE — Telephone Encounter (Signed)
Pt had labs drawn at PCP close to home on 03/29/14.  Pt was told level was slightly elevated and come back in 2 weeks, but no dose change was given.  Pt is due back there this week for repeat lab.  Advised pt to not change dose today, but call us when lab drawn, so that we can watch for results to come in.  Phone number given, pt voiced understanding.

## 2014-04-14 LAB — PROTIME-INR: INR: 3.3 — AB (ref <=1.1)

## 2014-04-16 ENCOUNTER — Telehealth: Payer: Self-pay | Admitting: Cardiovascular Disease

## 2014-04-16 DIAGNOSIS — I4891 Unspecified atrial fibrillation: Secondary | ICD-10-CM

## 2014-04-16 LAB — PROTIME-INR

## 2014-04-16 NOTE — Telephone Encounter (Signed)
[place] [facility] called for a inr order for patient.  Order placed and faxed to [phone]

## 2014-04-17 ENCOUNTER — Ambulatory Visit (INDEPENDENT_AMBULATORY_CARE_PROVIDER_SITE_OTHER): Payer: Medicare Other | Admitting: Pharmacist Clinician (PhC)/ Clinical Pharmacy Specialist

## 2014-04-17 DIAGNOSIS — I4891 Unspecified atrial fibrillation: Secondary | ICD-10-CM

## 2014-04-17 DIAGNOSIS — Z7901 Long term (current) use of anticoagulants: Secondary | ICD-10-CM

## 2014-04-25 ENCOUNTER — Other Ambulatory Visit: Payer: Self-pay | Admitting: Pharmacist Clinician (PhC)/ Clinical Pharmacy Specialist

## 2014-04-25 MED ORDER — WARFARIN SODIUM 5 MG PO TABS: ORAL_TABLET | ORAL | Status: DC

## 2014-05-30 ENCOUNTER — Other Ambulatory Visit: Payer: Self-pay

## 2014-05-30 MED ORDER — RAMIPRIL 10 MG PO CAPS: 10.0000 mg | ORAL_CAPSULE | Freq: Every morning | ORAL | Status: DC

## 2014-05-30 NOTE — Telephone Encounter (Signed)
Rx(s) sent to pharmacy electronically.  

## 2014-07-10 ENCOUNTER — Ambulatory Visit (INDEPENDENT_AMBULATORY_CARE_PROVIDER_SITE_OTHER): Payer: Medicare Other | Admitting: *Deleted

## 2014-07-10 ENCOUNTER — Ambulatory Visit (INDEPENDENT_AMBULATORY_CARE_PROVIDER_SITE_OTHER): Payer: Medicare Other | Admitting: Cardiovascular Disease

## 2014-07-10 ENCOUNTER — Encounter: Payer: Self-pay | Admitting: Cardiovascular Disease

## 2014-07-10 VITALS — BP 128/70 | HR 70 | Ht 60.0 in | Wt 166.0 lb

## 2014-07-10 DIAGNOSIS — I4891 Unspecified atrial fibrillation: Secondary | ICD-10-CM

## 2014-07-10 DIAGNOSIS — Z950 Presence of cardiac pacemaker: Secondary | ICD-10-CM

## 2014-07-10 DIAGNOSIS — I429 Cardiomyopathy, unspecified: Secondary | ICD-10-CM

## 2014-07-10 DIAGNOSIS — I10 Essential (primary) hypertension: Secondary | ICD-10-CM

## 2014-07-10 DIAGNOSIS — I428 Other cardiomyopathies: Secondary | ICD-10-CM

## 2014-07-10 DIAGNOSIS — I442 Atrioventricular block, complete: Secondary | ICD-10-CM

## 2014-07-10 DIAGNOSIS — T45515A Adverse effect of anticoagulants, initial encounter: Secondary | ICD-10-CM

## 2014-07-10 DIAGNOSIS — D6832 Hemorrhagic disorder due to extrinsic circulating anticoagulants: Secondary | ICD-10-CM

## 2014-07-10 DIAGNOSIS — D699 Hemorrhagic condition, unspecified: Secondary | ICD-10-CM

## 2014-07-10 DIAGNOSIS — Z7901 Long term (current) use of anticoagulants: Secondary | ICD-10-CM

## 2014-07-10 LAB — MDC_IDC_ENUM_SESS_TYPE_INCLINIC
Brady Statistic RV Percent Paced: 99 %
Implantable Pulse Generator Model: 5386
Lead Channel Impedance Value: 536 Ohm
Lead Channel Pacing Threshold Amplitude: 0.375 V
Lead Channel Setting Pacing Pulse Width: 0.5 ms
Lead Channel Setting Sensing Sensitivity: 2 mV
MDC IDC MSMT BATTERY VOLTAGE: 2.78 V
MDC IDC MSMT LEADCHNL RV PACING THRESHOLD PULSEWIDTH: 0.4 ms
MDC IDC PG SERIAL: 1457119

## 2014-07-10 NOTE — Progress Notes (Signed)
Patient ID: Denise [name], female   DOB: 09-29-1923, 81 y.o.   MRN: [id number]     Cardiology Office Note   Date:  07/10/2014   ID:  Denise [name], DOB 1923-08-22, MRN [id number]  PCP:  Daryl Eastern, MD  Cardiologist:   Thurmon Fair, MD   Chief Complaint  Patient presents with  . Follow-up    No complaints of chest pain, SOB, edema or dizzness.      History of Present Illness: Denise [name] is a 81 y.o. female who presents for Pacemaker followup, permanent atrial fibrillation, complete heart block status post AV node ablation.   Denise [name] is an 81 year old patient of Dr. Nicki Guadalajara with a history of persistent atrial fibrillation status post AV node ablation and implantation of a pacemaker. The device is a Engineer, water. Jude identity implanted in 2006. It is a dual chamber device that is now programmed VVIR for permanent atrial fibrillation. She has a severely dilated left atrium, left ventricular ejection fraction of 45-50% and an estimated systolic PA pressure of 50 mm Hg. She is felt to have nonischemic cardiomyopathy. She is on chronic warfarin anticoagulation without a history of TIA/stroke or other embolic events. She has not had any bleeding complications. Comprehensive interrogation of her pacemaker today shows normal function. No reprogramming changes were necessary.  She is pacemaker dependent. No escape rhythm when pacing is taken down to 30 bpm. Lead parameters are excellent. No high ventricular rates recorded.  She does not have cardiac complaints. Major concerns revolve around her gait stability, especially since she takes anticoagulation. She has not had any falls.    Past Medical History  Diagnosis Date  . Diabetes mellitus without complication   . Thyroid disease   . Permanent atrial fibrillation   . Hypertension   . CHF (congestive heart failure)   . LVH (left ventricular hypertrophy)   . Mitral regurgitation     mod to severe  . Tricuspid  regurgitation     severe  . Pulmonary hypertension     severe    Past Surgical History  Procedure Laterality Date  . Pacemaker insertion  05/26/2004    St.Jude  . Abdominal hysterectomy    . Tubal ligation    . Cholecystectomy    . Thyroidectomy    . Knee replacement      R  . Compression hip screw Right 06/06/2012    Procedure: Open Reduction Internal Fixation Right HIp;  Surgeon: Dannielle Huh, MD;  Location: [facility];  Service: Orthopedics;  Laterality: Right;  . Cardioversion  5 /18/ 2000    unsuccessful  . US echocardiography  02/25/2011    mod to severe MR,LA severe dilated,mild annular ca+,mod. TR,mod PI,mod ca+ of the aortic valve leaflet  . Nm myocar perf wall motion  02/28/2009    No signficant ischemia     Current Outpatient Prescriptions  Medication Sig Dispense Refill  . amLODipine (NORVASC) 5 MG tablet Take 1 tablet (5 mg total) by mouth every morning. 30 tablet 9  . atorvastatin (LIPITOR) 10 MG tablet Take 10 mg by mouth every evening.    . fish oil-omega-3 fatty acids 1000 MG capsule Take 1 g by mouth every morning.    . hydrochlorothiazide (HYDRODIURIL) 25 MG tablet Take 25 mg by mouth every morning.    . isosorbide mononitrate (IMDUR) 30 MG 24 hr tablet Take 1 tablet (30 mg total) by mouth every morning. 30 tablet 10  . methimazole (TAPAZOLE) 10 MG tablet  Take 10 mg by mouth every morning.    . metoprolol succinate (TOPROL-XL) 50 MG 24 hr tablet Take 1 tablet (50 mg total) by mouth daily. Take with or immediately following a meal. 90 tablet 3  . OVER THE COUNTER MEDICATION Take 1 tablet by mouth daily. OTC Potassium    . ramipril (ALTACE) 10 MG capsule Take 1 capsule (10 mg total) by mouth every morning. 30 capsule 7  . warfarin (COUMADIN) 5 MG tablet Take 1 to 1 & 1/2 tablets by mouth daily as directed 105 tablet 0   No current facility-administered medications for this visit.    Allergies:   Iohexol and Shrimp    Social History:  The patient  reports that  she has never smoked. She does not have any smokeless tobacco history on file. She reports that she does not drink alcohol or use illicit drugs.   Family History:  The patient's family history includes Asthma in her mother; Cancer - Colon in her brother; Gallbladder disease in her maternal grandmother; Heart attack in her brother and father; Heart disease in her child; Stroke in her maternal grandfather and sister.    ROS:  Please see the history of present illness.    Otherwise, review of systems positive for none.   All other systems are reviewed and negative.    PHYSICAL EXAM: VS:  BP 128/70 mmHg  Pulse 70  Ht 5' (1.524 m)  Wt 166 lb (75.297 kg)  BMI 32.42 kg/m2 , BMI Body mass index is 32.42 kg/(m^2).  General: Alert, oriented x3, no distress  Head: no evidence of trauma, PERRL, EOMI, no exophtalmos or lid lag, no myxedema, no xanthelasma; normal ears, nose and oropharynx  Neck: normal jugular venous pulsations and no hepatojugular reflux; brisk carotid pulses without delay and no carotid bruits  Chest: clear to auscultation, no signs of consolidation by percussion or palpation, normal fremitus, symmetrical and full respiratory excursions  Cardiovascular: normal position and quality of the apical impulse, regular rhythm, normal first and second heart sounds, no murmurs, rubs or gallops  Abdomen: no tenderness or distention, no masses by palpation, no abnormal pulsatility or arterial bruits, normal bowel sounds, no hepatosplenomegaly  Extremities: no clubbing, cyanosis or edema; 2+ radial, ulnar and brachial pulses bilaterally; 2+ right femoral, posterior tibial and dorsalis pedis pulses; 2+ left femoral, posterior tibial and dorsalis pedis pulses; no subclavian or femoral bruits  Neurological: grossly nonfocal  Psych: euthymic mood, full affect   EKG:  EKG is ordered today. The ekg ordered today demonstrates atrial fibrillation and 100% ventricular pacing   Recent  Labs: No results found for requested labs within last 365 days.    Lipid Panel No results found for: CHOL, TRIG, HDL, CHOLHDL, VLDL, LDLCALC, LDLDIRECT    Wt Readings from Last 3 Encounters:  07/10/14 166 lb (75.297 kg)  01/12/14 164 lb 12.8 oz (74.753 kg)  11/10/13 163 lb 6.4 oz (74.118 kg)      ASSESSMENT AND PLAN:  1.  Mrs. Riker has complete heart block status post AV node ablation and a normally functioning dual-chamber permanent pacemaker programmed VVIR. Estimated battery longevity is 3.5-4.5 years. There have been no episodes of recorded ventricular tachycardia. No changes are made to device settings. She is reminded about the importance of avoiding electromagnetic interference. Her device is not amenable to remote monitoring. Will perform in office device checks every 6 months. When we get closer to the end of estimated generator longevity will increase to frequency of  device checks.   Current medicines are reviewed at length with the patient today.  The patient does not have concerns regarding medicines.  The following changes have been made:  no change  Labs/ tests ordered today include:  Orders Placed This Encounter  Procedures  . EKG 12-Lead   Patient Instructions  Continue same dose of Warfarin and recheck in 6 weeks.  Dr. Royann Shivers recommends that you schedule a follow-up appointment in: 6 months with ST Jude pacer check.      Joie Bimler, MD  07/10/2014 2:51 PM    Thurmon Fair, MD, [facility] [phone] office [phone] pager

## 2014-07-10 NOTE — Patient Instructions (Addendum)
Continue same dose of Warfarin and recheck in 6 weeks.  Dr. Royann Shivers recommends that you schedule a follow-up appointment in: 6 months with ST Jude pacer check.

## 2014-07-26 ENCOUNTER — Other Ambulatory Visit: Payer: Self-pay | Admitting: Pharmacist Clinician (PhC)/ Clinical Pharmacy Specialist

## 2014-07-26 MED ORDER — WARFARIN SODIUM 5 MG PO TABS: ORAL_TABLET | ORAL | Status: DC

## 2014-08-16 ENCOUNTER — Encounter: Payer: Self-pay | Admitting: Cardiovascular Disease

## 2014-09-25 ENCOUNTER — Telehealth: Payer: Self-pay | Admitting: Cardiovascular Disease

## 2014-09-25 ENCOUNTER — Other Ambulatory Visit: Payer: Self-pay | Admitting: *Deleted

## 2014-09-25 MED ORDER — ISOSORBIDE MONONITRATE ER 30 MG PO TB24: 30.0000 mg | ORAL_TABLET | Freq: Every morning | ORAL | Status: DC

## 2014-09-25 NOTE — Telephone Encounter (Signed)
Elva called from pharmacy requesting OK for refill of pt's isosorbide for #90 and refill.  After chart review OK'd this, gave Dr. Erin Hearing DEA#.   Refill acknowledged.

## 2014-10-22 ENCOUNTER — Ambulatory Visit (INDEPENDENT_AMBULATORY_CARE_PROVIDER_SITE_OTHER): Payer: Medicare Other | Admitting: Pharmacist Clinician (PhC)/ Clinical Pharmacy Specialist

## 2014-10-22 ENCOUNTER — Ambulatory Visit (INDEPENDENT_AMBULATORY_CARE_PROVIDER_SITE_OTHER): Payer: Medicare Other | Admitting: Cardiovascular Disease

## 2014-10-22 ENCOUNTER — Encounter: Payer: Self-pay | Admitting: Cardiovascular Disease

## 2014-10-22 VITALS — BP 162/80 | HR 70 | Ht 61.0 in | Wt 165.2 lb

## 2014-10-22 DIAGNOSIS — E785 Hyperlipidemia, unspecified: Secondary | ICD-10-CM

## 2014-10-22 DIAGNOSIS — Z950 Presence of cardiac pacemaker: Secondary | ICD-10-CM

## 2014-10-22 DIAGNOSIS — Z79899 Other long term (current) drug therapy: Secondary | ICD-10-CM

## 2014-10-22 DIAGNOSIS — I4891 Unspecified atrial fibrillation: Secondary | ICD-10-CM | POA: Diagnosis not present

## 2014-10-22 DIAGNOSIS — Z7901 Long term (current) use of anticoagulants: Secondary | ICD-10-CM

## 2014-10-22 DIAGNOSIS — E119 Type 2 diabetes mellitus without complications: Secondary | ICD-10-CM

## 2014-10-22 DIAGNOSIS — I429 Cardiomyopathy, unspecified: Secondary | ICD-10-CM

## 2014-10-22 DIAGNOSIS — I480 Paroxysmal atrial fibrillation: Secondary | ICD-10-CM | POA: Diagnosis not present

## 2014-10-22 DIAGNOSIS — I428 Other cardiomyopathies: Secondary | ICD-10-CM

## 2014-10-22 DIAGNOSIS — I10 Essential (primary) hypertension: Secondary | ICD-10-CM

## 2014-10-22 LAB — POCT INR: INR: 2.1

## 2014-10-22 NOTE — Patient Instructions (Signed)
Your physician recommends that you schedule a follow-up appointment in: February.   Your physician recommends that you return for lab work fasting.

## 2014-10-24 ENCOUNTER — Encounter: Payer: Self-pay | Admitting: Cardiovascular Disease

## 2014-10-24 NOTE — Progress Notes (Signed)
Patient ID: Denise [name], female   DOB: April 07, 1923, 81 y.o.   MRN: [id number]    HPI: Denise [name] is a 81 y.o. female who presents to the office today for 57-month followup cardiology evaluation.   Denise [name] has a history of permanent atrial fibrillation and is status post AV node ablation by Dr. Severiano Gilbert and permanent pacemaker implantation in June 2006. She has a history of hypertensive cardiomyopathy. She has documented severe LA dilatation. On last echo in December 2012 ejection fraction was 45-50%. She had moderate to severe MR, moderate pulmonary hypertension a nodular sclerosis of the aortic valve the  She has a dual-chamber St. Jude identity permanent pacemaker. She last saw Dr. Royann Shivers for for pacemaker interrogation on June 16, 2014 at that time, and pacemaker interrogation revealed normal function, and no reprogramming changes were necessary.  She is pacemaker dependent and no escape rhythm was present.  When the pacemaker was taken down to 30 bpm.  She had excellent lead parameters and no high ventricular rates were recorded.  In March 2014 she broke her right hip and her right arm and required surgery by Dr. Valentina Gu. After being hospitalized she went to a nursing facility .  Since I last saw her year ago she is unaware of any significant palpitations.  She is on Coumadin for anticoagulation and denies bleeding.  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  She denies recent chest pain. She denies significant shortness of breath. At times she does note some mild leg swelling. She walks with a walker. She denies recent episodes of chest pain.  She denies presyncope or syncope.  She presents for followup evaluation.  Past Medical History  Diagnosis Date  . Diabetes mellitus without complication   . Thyroid disease   . Permanent atrial fibrillation   . Hypertension   . CHF (congestive heart failure)   . LVH (left ventricular hypertrophy)   .  Mitral regurgitation     mod to severe  . Tricuspid regurgitation     severe  . Pulmonary hypertension     severe    Past Surgical History  Procedure Laterality Date  . Pacemaker insertion  05/26/2004    St.Jude  . Abdominal hysterectomy    . Tubal ligation    . Cholecystectomy    . Thyroidectomy    . Knee replacement      R  . Compression hip screw Right 06/06/2012    Procedure: Open Reduction Internal Fixation Right HIp;  Surgeon: Dannielle Huh, MD;  Location: [facility] OR;  Service: Orthopedics;  Laterality: Right;  . Cardioversion  5 /18/ 2000    unsuccessful  . US echocardiography  02/25/2011    mod to severe MR,LA severe dilated,mild annular ca+,mod. TR,mod PI,mod ca+ of the aortic valve leaflet  . Nm myocar perf wall motion  02/28/2009    No signficant ischemia    Allergies  Allergen Reactions  . Iohexol Rash     Desc: VOMITING-VERIFIED ON 05-24-04 PRE-PROCEDURE/ ARS   . Shrimp [Shellfish Allergy] Nausea And Vomiting and Rash    Current Outpatient Prescriptions  Medication Sig Dispense Refill  . amLODipine (NORVASC) 5 MG tablet Take 1 tablet (5 mg total) by mouth every morning. 30 tablet 9  . atorvastatin (LIPITOR) 10 MG tablet Take 10 mg by mouth every evening.    . fish oil-omega-3 fatty acids 1000 MG capsule Take 1 g by mouth every morning.    [name]  hydrochlorothiazide (HYDRODIURIL) 25 MG tablet Take 25 mg by mouth every morning.    . isosorbide mononitrate (IMDUR) 30 MG 24 hr tablet Take 1 tablet (30 mg total) by mouth every morning. 90 tablet 0  . methimazole (TAPAZOLE) 10 MG tablet Take 10 mg by mouth every morning.    . metoprolol succinate (TOPROL-XL) 50 MG 24 hr tablet Take 1 tablet (50 mg total) by mouth daily. Take with or immediately following a meal. 90 tablet 3  . OVER THE COUNTER MEDICATION Take 1 tablet by mouth daily. OTC Potassium    . ramipril (ALTACE) 10 MG capsule Take 1 capsule (10 mg total) by mouth every morning. 30 capsule 7  . warfarin (COUMADIN) 5 MG  tablet Take 1 to 1 & 1/2 tablets by mouth daily as directed 105 tablet 0   No current facility-administered medications for this visit.    Socially she is widowed and has 4 children 4 grandchildren 4 great-grandchildren. There is no tobacco or alcohol use.  ROS General: Negative; No fevers, chills, or night sweats;  HEENT: Negative; No changes in vision or hearing, sinus congestion, difficulty swallowing Pulmonary: Negative; No cough, wheezing, shortness of breath, hemoptysis Cardiovascular: Negative; No chest pain, presyncope, syncope, palpitations Mild intermittent lower extremity edema GI: Negative; No nausea, vomiting, diarrhea, or abdominal pain GU: Negative; No dysuria, hematuria, or difficulty voiding Musculoskeletal: Negative; no myalgias, joint pain, or weakness Hematologic/Oncology: Negative; no easy bruising, bleeding Endocrine: Positive for diabetes mellitus and thyroid disease. Neuro: Negative; no changes in balance, headaches Skin: Negative; No rashes or skin lesions Psychiatric: Negative; No behavioral problems, depression Sleep: Negative; No snoring, daytime sleepiness, hypersomnolence, bruxism, restless legs, hypnogognic hallucinations, no cataplexy Other comprehensive 14 point system review is negative.  PE BP 162/80 mmHg  Pulse 70  Ht 5\' 1"  (1.549 m)  Wt 165 lb 3 oz (74.929 kg)  BMI 31.23 kg/m2  Repeat blood pressure by me 150/80  Wt Readings from Last 3 Encounters:  10/22/14 165 lb 3 oz (74.929 kg)  07/10/14 166 lb (75.297 kg)  01/12/14 164 lb 12.8 oz (74.753 kg)   General: Alert, oriented, no distress.  Skin: normal turgor, no rashes HEENT: Normocephalic, atraumatic. Pupils round and reactive; sclera anicteric;no lid lag,  Nose without nasal septal hypertrophy Mouth/Parynx benign; Mallinpatti scale 3 Neck: No JVD, no carotid bruits with normal carotid upstroke Lungs: clear to ausculatation and percussion; no wheezing or rales Chest wall: No tenderness  to palpation Heart: RRR, s1 s2 normal 2/6 systolic murmur; no S3 gallop.  No diastolic murmur rubs thrills or heaves. Abdomen: Moderate central adiposity. soft, nontender; no hepatosplenomehaly, BS+; abdominal aorta nontender and not dilated by palpation. Back: No CVA tenderness Pulses 2+ Extremities: Trace bilateral ankle edema. no clubbing cyanosis., Homan's sign negative  Neurologic: grossly nonfocal Psychological: Normal affect and moode  ECG (independently read by me): 100% ventricular paced rhythm at 70 bpm with underlying atrial fibrillation.  August 2015 ECG (independently read by me): Underlying atrial fibrillation with ventricular paced rhythm at 70 beats per minute with 100% capture.  LABS BMP Latest Ref Rng 06/08/2012 06/07/2012 06/06/2012  Glucose 70 - 99 mg/dL 432(X) 614(J) -  BUN 6 - 23 mg/dL 09(K) 95(F) -  Creatinine 0.50 - 1.10 mg/dL 4.73(U) 0.37 0.96  Sodium 135 - 145 mEq/L 133(L) 138 -  Potassium 3.5 - 5.1 mEq/L 3.6 4.0 -  Chloride 96 - 112 mEq/L 96 102 -  CO2 19 - 32 mEq/L 28 25 -  Calcium 8.4 -  10.5 mg/dL 8.5 8.6 -   Hepatic Function Latest Ref Rng 06/05/2012  Albumin 3.5 - 5.2 g/dL 3.1(L)   CBC Latest Ref Rng 06/09/2012 06/08/2012 06/07/2012  WBC 4.0 - 10.5 K/uL 9.2 9.8 9.2  Hemoglobin 12.0 - 15.0 g/dL 8.6(V) 10.0(L) 10.5(L)  Hematocrit 36.0 - 46.0 % 28.5(L) 30.7(L) 31.9(L)  Platelets 150 - 400 K/uL 182 162 135(L)   Lab Results  Component Value Date   MCV 84.8 06/09/2012   MCV 88.2 06/08/2012   MCV 86.4 06/07/2012   Lab Results  Component Value Date   TSH 2.301 06/05/2012  No results found for: HGBA1C  Lipid Panel  No results found for: CHOL, TRIG, HDL, CHOLHDL, VLDL, LDLCALC, LDLDIRECT  RADIOLOGY: No results found.    ASSESSMENT AND PLAN: Denise [name] is an 81 year old female who has a history of permanent atrial fibrillation and is status post AV node ablation and implantation of a St. Jude identity dual-chamber pacemaker, which is now programmed to  VVIR mode in light of her atrial fibrillation.  She is 100% paced.  Her last pacemaker interrogation showed normal function and no reprogramming was necessary.  She is on a percent pacemaker dependent.  I reviewed Dr. Erin Hearing last pacemaker note from April 2016.  She has a history of hypertension and her blood pressure today on repeat by me was 150/80.  Her medical regimen consisting of Ramipril 10 mg, Toprol-XL 50 mg, HCTZ 25 mg and amlodipine 5 mg. she will continue to monitor her blood pressure at home.  If her blood pressure continues to be elevated adjustment will be necessary to her medical regimen.  She is on Lipitor 10 mg for hyperlipidemia.  She does not have any edema presently.  She is not having any chest pain.  She has documented moderately severe MR, moderate pulmonary hypertension and aortic valve sclerosis with nodular sclerosis.  Her INR was checked today and this was therapeutic at 2.1 on her current dose of Coumadin.  I recommend a complete set of blood work obtained in the fasting state and adjustments will be made if necessary to medical regimen .  She will be undergoing a 6 month pacemaker evaluation with Dr. Royann Shivers in October.  I will see her in February 2017 for cardiology follow-up evaluation.  Lennette Bihari, MD, [facility]  10/24/2014 5:32 PM

## 2014-10-30 ENCOUNTER — Encounter: Payer: Self-pay | Admitting: Cardiovascular Disease

## 2014-11-07 ENCOUNTER — Other Ambulatory Visit: Payer: Self-pay | Admitting: Pharmacist Clinician (PhC)/ Clinical Pharmacy Specialist

## 2014-11-07 ENCOUNTER — Telehealth: Payer: Self-pay | Admitting: Cardiovascular Disease

## 2014-11-07 MED ORDER — WARFARIN SODIUM 5 MG PO TABS: ORAL_TABLET | ORAL | Status: DC

## 2014-11-07 NOTE — Telephone Encounter (Signed)
°  1. Which medications need to be refilled? Coumadin 5mg    2. Which pharmacy is medication to be sent to? [facility]   3. Do they need a 30 day or 90 day supply? Did not specify   4. Would they like a call back once the medication has been sent to the pharmacy? No

## 2014-11-07 NOTE — Telephone Encounter (Signed)
Rx(s) sent to pharmacy electronically.  

## 2014-12-04 LAB — PROTIME-INR: INR: 2.3 — AB (ref 0.9–1.1)

## 2014-12-07 ENCOUNTER — Ambulatory Visit (INDEPENDENT_AMBULATORY_CARE_PROVIDER_SITE_OTHER): Payer: Medicare Other | Admitting: Pharmacist Clinician (PhC)/ Clinical Pharmacy Specialist

## 2014-12-07 DIAGNOSIS — Z7901 Long term (current) use of anticoagulants: Secondary | ICD-10-CM | POA: Diagnosis not present

## 2014-12-07 DIAGNOSIS — I4891 Unspecified atrial fibrillation: Secondary | ICD-10-CM | POA: Diagnosis not present

## 2015-01-08 ENCOUNTER — Ambulatory Visit (INDEPENDENT_AMBULATORY_CARE_PROVIDER_SITE_OTHER): Payer: Medicare Other

## 2015-01-08 ENCOUNTER — Ambulatory Visit (INDEPENDENT_AMBULATORY_CARE_PROVIDER_SITE_OTHER): Payer: Medicare Other | Admitting: Cardiovascular Disease

## 2015-01-08 ENCOUNTER — Encounter: Payer: Self-pay | Admitting: Cardiovascular Disease

## 2015-01-08 VITALS — BP 132/60 | HR 62 | Resp 16 | Ht 61.0 in | Wt 166.9 lb

## 2015-01-08 DIAGNOSIS — I428 Other cardiomyopathies: Secondary | ICD-10-CM

## 2015-01-08 DIAGNOSIS — I5042 Chronic combined systolic (congestive) and diastolic (congestive) heart failure: Secondary | ICD-10-CM

## 2015-01-08 DIAGNOSIS — Z7901 Long term (current) use of anticoagulants: Secondary | ICD-10-CM

## 2015-01-08 DIAGNOSIS — Z950 Presence of cardiac pacemaker: Secondary | ICD-10-CM

## 2015-01-08 DIAGNOSIS — I5043 Acute on chronic combined systolic (congestive) and diastolic (congestive) heart failure: Secondary | ICD-10-CM | POA: Insufficient documentation

## 2015-01-08 DIAGNOSIS — I482 Chronic atrial fibrillation, unspecified: Secondary | ICD-10-CM

## 2015-01-08 DIAGNOSIS — I4891 Unspecified atrial fibrillation: Secondary | ICD-10-CM | POA: Diagnosis not present

## 2015-01-08 DIAGNOSIS — I442 Atrioventricular block, complete: Secondary | ICD-10-CM | POA: Diagnosis not present

## 2015-01-08 DIAGNOSIS — I429 Cardiomyopathy, unspecified: Secondary | ICD-10-CM

## 2015-01-08 LAB — CUP PACEART INCLINIC DEVICE CHECK
Battery Impedance: 3300 Ohm
Brady Statistic RV Percent Paced: 98 %
Date Time Interrogation Session: 20161025095249
Implantable Lead Location: 753859
Lead Channel Impedance Value: 499 Ohm
Lead Channel Pacing Threshold Amplitude: 0.375 V
Lead Channel Pacing Threshold Pulse Width: 0.5 ms
Lead Channel Setting Pacing Pulse Width: 0.5 ms
Lead Channel Setting Sensing Sensitivity: 2 mV
MDC IDC LEAD IMPLANT DT: 20060313
MDC IDC LEAD IMPLANT DT: 20060313
MDC IDC LEAD LOCATION: 753860
MDC IDC MSMT BATTERY VOLTAGE: 2.78 V
MDC IDC MSMT LEADCHNL RV SENSING INTR AMPL: 8.4 mV
Pulse Gen Model: 5386
Pulse Gen Serial Number: 1457119

## 2015-01-08 NOTE — Progress Notes (Signed)
Patient ID: [name], female   DOB: 1924/01/15, 81 y.o.   MRN: [id number]     Cardiology Office Note   Date:  01/09/2015   ID:  Denise [name], DOB 07-18-1923, MRN [id number]  PCP:  Daryl Eastern, MD  Cardiologist:   Nicki Guadalajara, M.D.  Thurmon Fair, MD   Chief Complaint  Patient presents with  . Follow-up    No complaints of chest pain, edema or dizziness.  SOB with minimal activity.      History of Present Illness: Denise [name] is a 81 y.o. female who presents for  Pacemaker check. She has permanent atrial fibrillation s/p AV node ablation with complete heart block and is pacemaker dependent (St. Jude identity implanted 2006, not amenable to remote monitoring). She just saw Dr. Tresa Endo for routine cardiac care in August.    interrogation of her pacemaker shows normal device function. Estimated generator longevity is  3-4 years. Battery voltage is 2.78 V. She has 98% ventricular pacing. She is not technically pacemaker dependent since there is an underlying escape rhythm at around 39 bpm,  But this may not be consistently present. All lead parameters are excellent. She has permanent atrial fibrillation and is anticoagulated with an INR that today is 2.5. She denies bleeding problems or focal neurological deficits. She also denies angina pectoris edema or dizziness. She describes dyspnea with minimal activity but is extremely sedentary.    Past Medical History  Diagnosis Date  . Diabetes mellitus without complication (HCC)   . Thyroid disease   . Permanent atrial fibrillation (HCC)   . Hypertension   . CHF (congestive heart failure) (HCC)   . LVH (left ventricular hypertrophy)   . Mitral regurgitation     mod to severe  . Tricuspid regurgitation     severe  . Pulmonary hypertension (HCC)     severe    Past Surgical History  Procedure Laterality Date  . Pacemaker insertion  05/26/2004    St.Jude  . Abdominal hysterectomy    . Tubal ligation    .  Cholecystectomy    . Thyroidectomy    . Knee replacement      R  . Compression hip screw Right 06/06/2012    Procedure: Open Reduction Internal Fixation Right HIp;  Surgeon: Dannielle Huh, MD;  Location: [facility] OR;  Service: Orthopedics;  Laterality: Right;  . Cardioversion  5 /18/ 2000    unsuccessful  . US echocardiography  02/25/2011    mod to severe MR,LA severe dilated,mild annular ca+,mod. TR,mod PI,mod ca+ of the aortic valve leaflet  . Nm myocar perf wall motion  02/28/2009    No signficant ischemia     Current Outpatient Prescriptions  Medication Sig Dispense Refill  . amLODipine (NORVASC) 5 MG tablet Take 1 tablet (5 mg total) by mouth every morning. 30 tablet 9  . atorvastatin (LIPITOR) 10 MG tablet Take 10 mg by mouth every evening.    . fish oil-omega-3 fatty acids 1000 MG capsule Take 1 g by mouth every morning.    . hydrochlorothiazide (HYDRODIURIL) 25 MG tablet Take 25 mg by mouth every morning.    . isosorbide mononitrate (IMDUR) 30 MG 24 hr tablet Take 1 tablet (30 mg total) by mouth every morning. 90 tablet 0  . methimazole (TAPAZOLE) 10 MG tablet Take 10 mg by mouth every morning.    . metoprolol succinate (TOPROL-XL) 50 MG 24 hr tablet Take 1 tablet (50 mg total) by mouth daily. Take with or  immediately following a meal. 90 tablet 3  . OVER THE COUNTER MEDICATION Take 1 tablet by mouth daily. OTC Potassium    . ramipril (ALTACE) 10 MG capsule Take 1 capsule (10 mg total) by mouth every morning. 30 capsule 7  . warfarin (COUMADIN) 5 MG tablet Take 1 to 1 & 1/2 tablets by mouth daily as directed.  Check INR monthly 105 tablet 0   No current facility-administered medications for this visit.    Allergies:   Iohexol and Shrimp    Social History:  The patient  reports that she has never smoked. She does not have any smokeless tobacco history on file. She reports that she does not drink alcohol or use illicit drugs.   Family History:  The patient's family history includes  Asthma in her mother; Cancer - Colon in her brother; Gallbladder disease in her maternal grandmother; Heart attack in her brother and father; Heart disease in her child; Stroke in her maternal grandfather and sister.    ROS:  Please see the history of present illness.    Otherwise, review of systems positive for none.   All other systems are reviewed and negative.    PHYSICAL EXAM: VS:  BP 132/60 mmHg  Pulse 62  Ht  (1.549 m)  Wt 166 lb 14.4 oz (75.705 kg)  BMI 31.55 kg/m2 , BMI Body mass index is 31.55 kg/(m^2).  General: Alert, oriented x3, no distress Head: no evidence of trauma, PERRL, EOMI, no exophtalmos or lid lag, no myxedema, no xanthelasma; normal ears, nose and oropharynx Neck: normal jugular venous pulsations and no hepatojugular reflux; brisk carotid pulses without delay and no carotid bruits Chest: clear to auscultation, no signs of consolidation by percussion or palpation, normal fremitus, symmetrical and full respiratory excursions Cardiovascular: normal position and quality of the apical impulse, regular rhythm, normal first and second heart sounds, no murmurs, rubs or gallops Abdomen: no tenderness or distention, no masses by palpation, no abnormal pulsatility or arterial bruits, normal bowel sounds, no hepatosplenomegaly Extremities: no clubbing, cyanosis or edema; 2+ radial, ulnar and brachial pulses bilaterally; 2+ right femoral, posterior tibial and dorsalis pedis pulses; 2+ left femoral, posterior tibial and dorsalis pedis pulses; no subclavian or femoral bruits Neurological: grossly nonfocal Psych: euthymic mood, full affect   EKG:  EKG is not ordered today.   Wt Readings from Last 3 Encounters:  01/08/15 166 lb 14.4 oz (75.705 kg)  10/22/14 165 lb 3 oz (74.929 kg)  07/10/14 166 lb (75.297 kg)      Other studies Reviewed: Additional studies/ records that were reviewed today include:  Notes from Dr. Tresa Endo and Coumadin clinic.   ASSESSMENT AND  PLAN:  1.  Complete heart block status post AV node ablation. Although there is a slow idioventricular escape rhythm, she should be considered pacemaker dependent  2.  Permanent atrial fibrillation on anticoagulation therapy  3.  Only functioning dual-chamber permanent pacemaker. For the time being continue device checks in the clinic every 6 months. When we get closer to expected ERI will start taking every 3 months.  Will be alternating her pacemaker check visits with Dr. Michel Harrow clinical appointments on an every three-month basis    Current medicines are reviewed at length with the patient today.  The patient does not have concerns regarding medicines.  The following changes have been made:  no change  Labs/ tests ordered today include:  Orders Placed This Encounter  Procedures  . Implantable device check  Patient Instructions  Your physician recommends that you schedule a follow-up appointment in: 6 months with Dr.Ensley Blas with pacemaker check (ST JUDE).     Joie Bimler, MD  01/09/2015 4:01 PM    Thurmon Fair, MD, [facility] [phone] office [phone] pager

## 2015-01-08 NOTE — Patient Instructions (Addendum)
Your physician recommends that you schedule a follow-up appointment in: 6 months with Dr.Croitoru with pacemaker check (ST JUDE).

## 2015-01-09 LAB — POCT INR: INR: 2.5

## 2015-01-16 ENCOUNTER — Other Ambulatory Visit: Payer: Self-pay | Admitting: Cardiovascular Disease

## 2015-01-16 MED ORDER — METOPROLOL SUCCINATE ER 50 MG PO TB24: 50.0000 mg | ORAL_TABLET | Freq: Every day | ORAL | Status: DC

## 2015-01-22 ENCOUNTER — Encounter: Payer: Self-pay | Admitting: Cardiovascular Disease

## 2015-02-11 ENCOUNTER — Other Ambulatory Visit: Payer: Self-pay | Admitting: Pharmacist Clinician (PhC)/ Clinical Pharmacy Specialist

## 2015-02-11 MED ORDER — WARFARIN SODIUM 5 MG PO TABS: ORAL_TABLET | ORAL | Status: DC

## 2015-02-11 NOTE — Telephone Encounter (Signed)
Called patient, reminded her to get labs drawn

## 2015-02-25 ENCOUNTER — Other Ambulatory Visit: Payer: Self-pay | Admitting: *Deleted

## 2015-02-25 MED ORDER — RAMIPRIL 10 MG PO CAPS: 10.0000 mg | ORAL_CAPSULE | Freq: Every morning | ORAL | Status: DC

## 2015-02-25 NOTE — Telephone Encounter (Signed)
Ramipril refilled electronically [id number] with 1 refill

## 2015-03-12 LAB — PROTIME-INR: INR: 3 — AB (ref 0.9–1.1)

## 2015-03-13 ENCOUNTER — Ambulatory Visit (INDEPENDENT_AMBULATORY_CARE_PROVIDER_SITE_OTHER): Payer: Medicare Other | Admitting: Pharmacist Clinician (PhC)/ Clinical Pharmacy Specialist

## 2015-03-13 DIAGNOSIS — Z7901 Long term (current) use of anticoagulants: Secondary | ICD-10-CM

## 2015-03-13 DIAGNOSIS — I482 Chronic atrial fibrillation, unspecified: Secondary | ICD-10-CM

## 2015-03-25 ENCOUNTER — Other Ambulatory Visit: Payer: Self-pay | Admitting: *Deleted

## 2015-03-25 MED ORDER — ISOSORBIDE MONONITRATE ER 30 MG PO TB24: 30.0000 mg | ORAL_TABLET | Freq: Every morning | ORAL | Status: DC

## 2015-04-11 ENCOUNTER — Ambulatory Visit (INDEPENDENT_AMBULATORY_CARE_PROVIDER_SITE_OTHER): Payer: Medicare Other | Admitting: Pharmacist Clinician (PhC)/ Clinical Pharmacy Specialist

## 2015-04-11 ENCOUNTER — Ambulatory Visit (INDEPENDENT_AMBULATORY_CARE_PROVIDER_SITE_OTHER): Payer: Medicare Other | Admitting: Cardiovascular Disease

## 2015-04-11 ENCOUNTER — Encounter: Payer: Self-pay | Admitting: Cardiovascular Disease

## 2015-04-11 VITALS — BP 90/58 | HR 70 | Ht 61.0 in | Wt 162.0 lb

## 2015-04-11 DIAGNOSIS — I4891 Unspecified atrial fibrillation: Secondary | ICD-10-CM | POA: Diagnosis not present

## 2015-04-11 DIAGNOSIS — E785 Hyperlipidemia, unspecified: Secondary | ICD-10-CM | POA: Diagnosis not present

## 2015-04-11 DIAGNOSIS — I482 Chronic atrial fibrillation, unspecified: Secondary | ICD-10-CM

## 2015-04-11 DIAGNOSIS — Z7901 Long term (current) use of anticoagulants: Secondary | ICD-10-CM

## 2015-04-11 DIAGNOSIS — I428 Other cardiomyopathies: Secondary | ICD-10-CM

## 2015-04-11 DIAGNOSIS — I429 Cardiomyopathy, unspecified: Secondary | ICD-10-CM | POA: Diagnosis not present

## 2015-04-11 DIAGNOSIS — I10 Essential (primary) hypertension: Secondary | ICD-10-CM

## 2015-04-11 DIAGNOSIS — Z950 Presence of cardiac pacemaker: Secondary | ICD-10-CM

## 2015-04-11 LAB — POCT INR: INR: 3.6

## 2015-04-11 MED ORDER — RAMIPRIL 5 MG PO CAPS: 5.0000 mg | ORAL_CAPSULE | Freq: Every day | ORAL | Status: DC

## 2015-04-11 NOTE — Progress Notes (Signed)
Patient ID: Denise [name], female   DOB: Nov 06, 1923, 82 y.o.   MRN: [id number]    HPI: Denise [name] is a 82 y.o. female who presents to the office today for a 41-month followup cardiology evaluation.   Denise [name] has a history of permanent atrial fibrillation and is status post AV node ablation by Dr. Severiano Gilbert and permanent pacemaker implantation in June 2006. She has a history of hypertensive cardiomyopathy. She has documented severe LA dilatation. On an echo in December 2012 ejection fraction was 45-50%. She had moderate to severe MR, moderate pulmonary hypertension a nodular sclerosis of the aortic valve the  She has a dual-chamber St. Jude identity permanent pacemaker. She lis followed by Dr. Royann Shivers for her pacemaker and on June 16, 2014  pacemaker interrogation revealed normal function, and no reprogramming changes were necessary.  She is pacemaker dependent and no escape rhythm was present.  When the pacemaker was taken down to 30 bpm.  She had excellent lead parameters and no high ventricular rates were recorded.  In March 2014 she broke her right hip and her right arm and required surgery by Dr. Valentina Gu. After being hospitalized she went to a nursing facility .  Since I last saw her year ago she is unaware of any significant palpitations.  She is on Coumadin for anticoagulation and denies bleeding.  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  She denies recent chest pain. She denies significant shortness of breath. At times she does note some mild leg swelling. She walks with a walker. She denies recent episodes of chest pain.  She denies presyncope or syncope.   Past Medical History  Diagnosis Date  . Diabetes mellitus without complication (HCC)   . Thyroid disease   . Permanent atrial fibrillation (HCC)   . Hypertension   . CHF (congestive heart failure) (HCC)   . LVH (left ventricular hypertrophy)   . Mitral regurgitation     mod to severe    . Tricuspid regurgitation     severe  . Pulmonary hypertension (HCC)     severe    Past Surgical History  Procedure Laterality Date  . Pacemaker insertion  05/26/2004    St.Jude  . Abdominal hysterectomy    . Tubal ligation    . Cholecystectomy    . Thyroidectomy    . Knee replacement      R  . Compression hip screw Right 06/06/2012    Procedure: Open Reduction Internal Fixation Right HIp;  Surgeon: Dannielle Huh, MD;  Location: [facility] OR;  Service: Orthopedics;  Laterality: Right;  . Cardioversion  5 /18/ 2000    unsuccessful  . US echocardiography  02/25/2011    mod to severe MR,LA severe dilated,mild annular ca+,mod. TR,mod PI,mod ca+ of the aortic valve leaflet  . Nm myocar perf wall motion  02/28/2009    No signficant ischemia    Allergies  Allergen Reactions  . Iohexol Rash     Desc: VOMITING-VERIFIED ON 05-24-04 PRE-PROCEDURE/ ARS   . Shrimp [Shellfish Allergy] Nausea And Vomiting and Rash    Current Outpatient Prescriptions  Medication Sig Dispense Refill  . amLODipine (NORVASC) 5 MG tablet Take 1 tablet (5 mg total) by mouth every morning. 30 tablet 9  . atorvastatin (LIPITOR) 10 MG tablet Take 10 mg by mouth every evening.    . fish oil-omega-3 fatty acids 1000 MG capsule Take 1 g by mouth every morning.    [name]  hydrochlorothiazide (HYDRODIURIL) 25 MG tablet Take 12.5 mg by mouth every morning.    . isosorbide mononitrate (IMDUR) 30 MG 24 hr tablet Take 1 tablet (30 mg total) by mouth every morning. 90 tablet 1  . methimazole (TAPAZOLE) 10 MG tablet Take 10 mg by mouth every morning.    . metoprolol succinate (TOPROL-XL) 50 MG 24 hr tablet Take 1 tablet (50 mg total) by mouth daily. Take with or immediately following a meal. 90 tablet 3  . potassium chloride SA (K-DUR,KLOR-CON) 20 MEQ tablet     . warfarin (COUMADIN) 5 MG tablet Take 1 to 1 & 1/2 tablets by mouth daily as directed.  Check INR monthly 105 tablet 0  . ramipril (ALTACE) 5 MG capsule Take 1 capsule (5 mg  total) by mouth daily. 30 capsule 6   No current facility-administered medications for this visit.    Socially she is widowed and has 4 children 4 grandchildren 4 great-grandchildren. There is no tobacco or alcohol use.  ROS General: Negative; No fevers, chills, or night sweats;  HEENT: Negative; No changes in vision or hearing, sinus congestion, difficulty swallowing Pulmonary: Negative; No cough, wheezing, shortness of breath, hemoptysis Cardiovascular: Negative; No chest pain, presyncope, syncope, palpitations Mild intermittent lower extremity edema GI: Negative; No nausea, vomiting, diarrhea, or abdominal pain GU: Negative; No dysuria, hematuria, or difficulty voiding Musculoskeletal: Negative; no myalgias, joint pain, or weakness Hematologic/Oncology: Negative; no easy bruising, bleeding Endocrine: Positive for diabetes mellitus and thyroid disease. Neuro: Negative; no changes in balance, headaches Skin: Negative; No rashes or skin lesions Psychiatric: Negative; No behavioral problems, depression Sleep: Negative; No snoring, daytime sleepiness, hypersomnolence, bruxism, restless legs, hypnogognic hallucinations, no cataplexy Other comprehensive 14 point system review is negative.  PE BP 90/58 mmHg  Pulse 70  Ht 5\' 1"  (1.549 m)  Wt 162 lb (73.483 kg)  BMI 30.63 kg/m2  Repeat blood pressure by me 110/70  Wt Readings from Last 3 Encounters:  04/11/15 162 lb (73.483 kg)  01/08/15 166 lb 14.4 oz (75.705 kg)  10/22/14 165 lb 3 oz (74.929 kg)   General: Alert, oriented, no distress.  Skin: normal turgor, no rashes HEENT: Normocephalic, atraumatic. Pupils round and reactive; sclera anicteric;no lid lag,  Nose without nasal septal hypertrophy Mouth/Parynx benign; Mallinpatti scale 3 Neck: No JVD, no carotid bruits with normal carotid upstroke Lungs: clear to ausculatation and percussion; no wheezing or rales Chest wall: No tenderness to palpation Heart: RRR, s1 s2 normal 2/6  systolic murmur; no S3 gallop.  No diastolic murmur rubs thrills or heaves. Abdomen: Moderate central adiposity. soft, nontender; no hepatosplenomehaly, BS+; abdominal aorta nontender and not dilated by palpation. Back: No CVA tenderness Pulses 2+ Extremities: Trace bilateral ankle edema. no clubbing cyanosis., Homan's sign negative  Neurologic: grossly nonfocal Psychological: Normal affect and moode  ECG (independently read by me):  V paced rhythm at 70 bpm with underlying atrial fibrillation.  ECG (independently read by me): 100% ventricular paced rhythm at 70 bpm with underlying atrial fibrillation.  August 2015 ECG (independently read by me): Underlying atrial fibrillation with ventricular paced rhythm at 70 beats per minute with 100% capture.  LABS BMP Latest Ref Rng 06/08/2012 06/07/2012 06/06/2012  Glucose 70 - 99 mg/dL 244(Q) 286(N) -  BUN 6 - 23 mg/dL 81(R) 71(H) -  Creatinine 0.50 - 1.10 mg/dL 6.57(X) 0.38 3.33  Sodium 135 - 145 mEq/L 133(L) 138 -  Potassium 3.5 - 5.1 mEq/L 3.6 4.0 -  Chloride 96 - 112 mEq/L 96 102 -  CO2 19 - 32 mEq/L 28 25 -  Calcium 8.4 - 10.5 mg/dL 8.5 8.6 -   Hepatic Function Latest Ref Rng 06/05/2012  Albumin 3.5 - 5.2 g/dL 3.1(L)   CBC Latest Ref Rng 06/09/2012 06/08/2012 06/07/2012  WBC 4.0 - 10.5 K/uL 9.2 9.8 9.2  Hemoglobin 12.0 - 15.0 g/dL 1.6(X) 10.0(L) 10.5(L)  Hematocrit 36.0 - 46.0 % 28.5(L) 30.7(L) 31.9(L)  Platelets 150 - 400 K/uL 182 162 135(L)   Lab Results  Component Value Date   MCV 84.8 06/09/2012   MCV 88.2 06/08/2012   MCV 86.4 06/07/2012   Lab Results  Component Value Date   TSH 2.301 06/05/2012  No results found for: HGBA1C  Lipid Panel  No results found for: CHOL, TRIG, HDL, CHOLHDL, VLDL, LDLCALC, LDLDIRECT  RADIOLOGY: No results found.    ASSESSMENT AND PLAN: Denise [name] is an 82 year old female who has a history of permanent atrial fibrillation and is status post AV node ablation and implantation of a St. Jude  identity dual-chamber pacemaker programmed to VVIR mode in light of her atrial fibrillation.  She is 100% paced.  Her last pacemaker interrogation showed normal function and no reprogramming was necessary.   She has a history of hypertension  And most recently has been on Remeron tilt 10 mg, Toprol-XL 50 mg, HCTZ 25 mg, amlodipine 5 mg, and she also takes isosorbide mononitrate 30 mg. At present, there are no signs of edema.  Her blood pressure is low.  She is 82 years old.  I am recommending reduction of her HCTZ dose to 12.5 mg and will reduce her Ramipril from 10 mg down to 5 mg. She is on atorvastatin for hyperlipidemia.  There are no myalgias. She continues to be on warfarin followed by INR.  She denies recent bleeding.  I am recommending repeat blood work be obtained in the fasting state.  She sees Dr. Darius Bump in [place] for her primary care.  I will see her in 6 months for cardiology reevaluation.  Time spent:  25 minutes  Lennette Bihari, MD, [facility]  04/11/2015 9:38 PM

## 2015-04-11 NOTE — Patient Instructions (Signed)
Your physician has recommended you make the following change in your medication:   1.) the HCTZ 25 mg has been cut into half. Take 1/2 tablet daily.  2.) the ramipril has been changed to 5 mg daily. A new prescription has been sent to your pharmacy to reflect this change.  Your physician recommends that you return for lab work fasting.  Your physician wants you to follow-up in: 6 months or sooner if needed. You will receive a reminder letter in the mail two months in advance. If you don't receive a letter, please call our office to schedule the follow-up appointment.

## 2015-04-26 ENCOUNTER — Ambulatory Visit (INDEPENDENT_AMBULATORY_CARE_PROVIDER_SITE_OTHER): Payer: Medicare Other | Admitting: Pharmacist Clinician (PhC)/ Clinical Pharmacy Specialist

## 2015-04-26 DIAGNOSIS — Z7901 Long term (current) use of anticoagulants: Secondary | ICD-10-CM

## 2015-04-26 DIAGNOSIS — I482 Chronic atrial fibrillation, unspecified: Secondary | ICD-10-CM

## 2015-04-26 LAB — PROTIME-INR: INR: 3.1 — AB (ref <=1.1)

## 2015-05-01 ENCOUNTER — Telehealth: Payer: Self-pay | Admitting: Cardiovascular Disease

## 2015-05-01 NOTE — Telephone Encounter (Signed)
Returned call to patient. She is calling about lab results - had drawn at [facility]) in [place] last week. Informed patient we do not have results in system - I would call [facility] and see if we can obtain copies. Pt aware.  I placed call and spoke to Raynelle Fanning in lab at the resulting hospital. She voiced understanding of my request and stated that she would fax the laboratory results to [place] at [phone].

## 2015-05-01 NOTE — Telephone Encounter (Signed)
New message ° ° ° ° ° ° °Calling to get lab results °

## 2015-05-06 ENCOUNTER — Other Ambulatory Visit: Payer: Self-pay | Admitting: *Deleted

## 2015-05-06 MED ORDER — RAMIPRIL 5 MG PO CAPS: 5.0000 mg | ORAL_CAPSULE | Freq: Every day | ORAL | Status: DC

## 2015-05-23 ENCOUNTER — Other Ambulatory Visit: Payer: Self-pay | Admitting: Pharmacist Clinician (PhC)/ Clinical Pharmacy Specialist

## 2015-05-23 MED ORDER — WARFARIN SODIUM 5 MG PO TABS: ORAL_TABLET | ORAL | Status: DC

## 2015-06-07 ENCOUNTER — Encounter: Payer: Self-pay | Admitting: Physician Assistant

## 2015-06-07 ENCOUNTER — Observation Stay (HOSPITAL_COMMUNITY)
Admission: AD | Admit: 2015-06-07 | Discharge: 2015-06-11 | Disposition: A | Discharge status: Home / Self Care | Payer: Medicare Other | Source: Ambulatory Visit | Attending: Cardiology | Admitting: Cardiology

## 2015-06-07 ENCOUNTER — Encounter (HOSPITAL_COMMUNITY): Payer: Self-pay | Admitting: *Deleted

## 2015-06-07 ENCOUNTER — Ambulatory Visit (INDEPENDENT_AMBULATORY_CARE_PROVIDER_SITE_OTHER): Payer: Medicare Other | Admitting: Physician Assistant

## 2015-06-07 ENCOUNTER — Ambulatory Visit (INDEPENDENT_AMBULATORY_CARE_PROVIDER_SITE_OTHER): Payer: Medicare Other | Admitting: Pharmacist Clinician (PhC)/ Clinical Pharmacy Specialist

## 2015-06-07 VITALS — BP 120/56 | HR 78 | Ht 61.0 in | Wt 166.4 lb

## 2015-06-07 DIAGNOSIS — K921 Melena: Principal | ICD-10-CM

## 2015-06-07 DIAGNOSIS — I482 Chronic atrial fibrillation, unspecified: Secondary | ICD-10-CM

## 2015-06-07 DIAGNOSIS — R0609 Other forms of dyspnea: Secondary | ICD-10-CM | POA: Diagnosis present

## 2015-06-07 DIAGNOSIS — I110 Hypertensive heart disease with heart failure: Secondary | ICD-10-CM | POA: Insufficient documentation

## 2015-06-07 DIAGNOSIS — Z7901 Long term (current) use of anticoagulants: Secondary | ICD-10-CM | POA: Diagnosis not present

## 2015-06-07 DIAGNOSIS — I509 Heart failure, unspecified: Secondary | ICD-10-CM | POA: Insufficient documentation

## 2015-06-07 DIAGNOSIS — Z96651 Presence of right artificial knee joint: Secondary | ICD-10-CM | POA: Insufficient documentation

## 2015-06-07 DIAGNOSIS — R195 Other fecal abnormalities: Secondary | ICD-10-CM | POA: Insufficient documentation

## 2015-06-07 DIAGNOSIS — K449 Diaphragmatic hernia without obstruction or gangrene: Secondary | ICD-10-CM | POA: Insufficient documentation

## 2015-06-07 DIAGNOSIS — I428 Other cardiomyopathies: Secondary | ICD-10-CM

## 2015-06-07 DIAGNOSIS — I5042 Chronic combined systolic (congestive) and diastolic (congestive) heart failure: Secondary | ICD-10-CM

## 2015-06-07 DIAGNOSIS — I4891 Unspecified atrial fibrillation: Secondary | ICD-10-CM

## 2015-06-07 DIAGNOSIS — Z950 Presence of cardiac pacemaker: Secondary | ICD-10-CM | POA: Diagnosis not present

## 2015-06-07 DIAGNOSIS — K210 Gastro-esophageal reflux disease with esophagitis: Secondary | ICD-10-CM | POA: Diagnosis not present

## 2015-06-07 DIAGNOSIS — R531 Weakness: Secondary | ICD-10-CM | POA: Diagnosis not present

## 2015-06-07 DIAGNOSIS — E119 Type 2 diabetes mellitus without complications: Secondary | ICD-10-CM | POA: Insufficient documentation

## 2015-06-07 DIAGNOSIS — K922 Gastrointestinal hemorrhage, unspecified: Secondary | ICD-10-CM | POA: Diagnosis present

## 2015-06-07 DIAGNOSIS — E785 Hyperlipidemia, unspecified: Secondary | ICD-10-CM | POA: Diagnosis not present

## 2015-06-07 DIAGNOSIS — I10 Essential (primary) hypertension: Secondary | ICD-10-CM | POA: Diagnosis present

## 2015-06-07 DIAGNOSIS — Z79899 Other long term (current) drug therapy: Secondary | ICD-10-CM | POA: Insufficient documentation

## 2015-06-07 LAB — CBC WITH DIFFERENTIAL/PLATELET
BASOS ABS: 0.1 10*3/uL (ref 0.0–0.1)
BASOS ABS: 0 10*3/uL (ref 0.0–0.1)
BASOS PCT: 1 %
Basophils Relative: 1 % (ref 0–1)
EOS ABS: 0.1 10*3/uL (ref 0.0–0.7)
EOS ABS: 0.1 10*3/uL (ref 0.0–0.7)
Eosinophils Relative: 1 % (ref 0–5)
Eosinophils Relative: 1 %
HCT: 40.6 % (ref 36.0–46.0)
HEMATOCRIT: 42.7 % (ref 36.0–46.0)
HEMOGLOBIN: 13.9 g/dL (ref 12.0–15.0)
Hemoglobin: 13 g/dL (ref 12.0–15.0)
LYMPHS ABS: 1.9 10*3/uL (ref 0.7–4.0)
Lymphocytes Relative: 31 % (ref 12–46)
Lymphocytes Relative: 31 %
Lymphs Abs: 2.2 10*3/uL (ref 0.7–4.0)
MCH: 28.1 pg (ref 26.0–34.0)
MCH: 28.4 pg (ref 26.0–34.0)
MCHC: 32 g/dL (ref 30.0–36.0)
MCHC: 32.6 g/dL (ref 30.0–36.0)
MCV: 87.9 fL (ref 78.0–100.0)
MCV: 87.1 fL (ref 78.0–100.0)
MPV: 11.8 fL (ref 8.6–12.4)
Monocytes Absolute: 0.5 10*3/uL (ref 0.1–1.0)
Monocytes Absolute: 0.7 10*3/uL (ref 0.1–1.0)
Monocytes Relative: 9 % (ref 3–12)
Monocytes Relative: 10 %
NEUTROS ABS: 4 10*3/uL (ref 1.7–7.7)
NEUTROS PCT: 58 % (ref 43–77)
NEUTROS PCT: 57 %
Neutro Abs: 3.5 10*3/uL (ref 1.7–7.7)
PLATELETS: 152 10*3/uL (ref 150–400)
Platelets: 158 10*3/uL (ref 150–400)
RBC: 4.62 MIL/uL (ref 3.87–5.11)
RBC: 4.9 MIL/uL (ref 3.87–5.11)
RDW: 16.4 % — ABNORMAL HIGH (ref 11.5–15.5)
RDW: 16.3 % — AB (ref 11.5–15.5)
WBC: 6 10*3/uL (ref 4.0–10.5)
WBC: 7 10*3/uL (ref 4.0–10.5)

## 2015-06-07 LAB — COMPREHENSIVE METABOLIC PANEL
ALT: 21 U/L (ref 14–54)
ANION GAP: 10 (ref 5–15)
AST: 25 U/L (ref 15–41)
Albumin: 4.3 g/dL (ref 3.5–5.0)
Alkaline Phosphatase: 82 U/L (ref 38–126)
BILIRUBIN TOTAL: 1.7 mg/dL — AB (ref 0.3–1.2)
BUN: 28 mg/dL — ABNORMAL HIGH (ref 6–20)
CALCIUM: 9.2 mg/dL (ref 8.9–10.3)
CO2: 27 mmol/L (ref 22–32)
Chloride: 101 mmol/L (ref 101–111)
Creatinine, Ser: 1.19 mg/dL — ABNORMAL HIGH (ref 0.44–1.00)
GFR, EST AFRICAN AMERICAN: 46 mL/min — AB (ref >60)
GFR, EST NON AFRICAN AMERICAN: 39 mL/min — AB (ref >60)
Glucose, Bld: 107 mg/dL — ABNORMAL HIGH (ref 65–99)
POTASSIUM: 3.6 mmol/L (ref 3.5–5.1)
Sodium: 138 mmol/L (ref 135–145)
TOTAL PROTEIN: 7.7 g/dL (ref 6.5–8.1)

## 2015-06-07 LAB — TSH: TSH: 5.806 u[IU]/mL — AB (ref 0.350–4.500)

## 2015-06-07 LAB — POCT INR: INR: 3.4

## 2015-06-07 LAB — GLUCOSE, CAPILLARY: Glucose-Capillary: 107 mg/dL — ABNORMAL HIGH (ref 65–99)

## 2015-06-07 MED ORDER — METHIMAZOLE 10 MG PO TABS
10.0000 mg | ORAL_TABLET | Freq: Every morning | ORAL | Status: DC
  Administered 2015-06-08 – 2015-06-11 (×4): 10 mg via ORAL
  Filled 2015-06-07 (×8): qty 1

## 2015-06-07 MED ORDER — RAMIPRIL 5 MG PO CAPS
5.0000 mg | ORAL_CAPSULE | Freq: Every day | ORAL | Status: DC
  Administered 2015-06-08 – 2015-06-11 (×4): 5 mg via ORAL
  Filled 2015-06-07 (×4): qty 1

## 2015-06-07 MED ORDER — NITROGLYCERIN 0.4 MG SL SUBL: 0.4000 mg | SUBLINGUAL_TABLET | SUBLINGUAL | Status: DC | PRN

## 2015-06-07 MED ORDER — OMEGA-3 FATTY ACIDS 1000 MG PO CAPS: 1.0000 g | ORAL_CAPSULE | Freq: Every morning | ORAL | Status: DC

## 2015-06-07 MED ORDER — METOPROLOL SUCCINATE ER 50 MG PO TB24
50.0000 mg | ORAL_TABLET | Freq: Every day | ORAL | Status: DC
  Administered 2015-06-08 – 2015-06-11 (×4): 50 mg via ORAL
  Filled 2015-06-07 (×4): qty 1

## 2015-06-07 MED ORDER — ALPRAZOLAM 0.25 MG PO TABS: 0.2500 mg | ORAL_TABLET | Freq: Two times a day (BID) | ORAL | Status: DC | PRN

## 2015-06-07 MED ORDER — SODIUM CHLORIDE 0.9% FLUSH
3.0000 mL | Freq: Two times a day (BID) | INTRAVENOUS | Status: DC
  Administered 2015-06-07 – 2015-06-10 (×5): 3 mL via INTRAVENOUS

## 2015-06-07 MED ORDER — AMLODIPINE BESYLATE 5 MG PO TABS
5.0000 mg | ORAL_TABLET | Freq: Every morning | ORAL | Status: DC
  Administered 2015-06-08 – 2015-06-11 (×4): 5 mg via ORAL
  Filled 2015-06-07 (×4): qty 1

## 2015-06-07 MED ORDER — ISOSORBIDE MONONITRATE ER 30 MG PO TB24
30.0000 mg | ORAL_TABLET | Freq: Every morning | ORAL | Status: DC
  Administered 2015-06-08 – 2015-06-11 (×4): 30 mg via ORAL
  Filled 2015-06-07 (×4): qty 1

## 2015-06-07 MED ORDER — ONDANSETRON HCL 4 MG/2ML IJ SOLN: 4.0000 mg | Freq: Four times a day (QID) | INTRAMUSCULAR | Status: DC | PRN

## 2015-06-07 MED ORDER — POTASSIUM CHLORIDE CRYS ER 20 MEQ PO TBCR
20.0000 meq | EXTENDED_RELEASE_TABLET | Freq: Every day | ORAL | Status: DC
  Administered 2015-06-08 – 2015-06-11 (×4): 20 meq via ORAL
  Filled 2015-06-07 (×4): qty 1

## 2015-06-07 MED ORDER — SODIUM CHLORIDE 0.9 % IV SOLN: 250.0000 mL | INTRAVENOUS | Status: DC | PRN

## 2015-06-07 MED ORDER — ZOLPIDEM TARTRATE 5 MG PO TABS: 5.0000 mg | ORAL_TABLET | Freq: Every evening | ORAL | Status: DC | PRN

## 2015-06-07 MED ORDER — ACETAMINOPHEN 325 MG PO TABS: 650.0000 mg | ORAL_TABLET | ORAL | Status: DC | PRN

## 2015-06-07 MED ORDER — ATORVASTATIN CALCIUM 10 MG PO TABS
10.0000 mg | ORAL_TABLET | Freq: Every evening | ORAL | Status: DC
  Administered 2015-06-07 – 2015-06-10 (×4): 10 mg via ORAL
  Filled 2015-06-07 (×4): qty 1

## 2015-06-07 MED ORDER — HYDROCHLOROTHIAZIDE 25 MG PO TABS
12.5000 mg | ORAL_TABLET | Freq: Every morning | ORAL | Status: DC
  Administered 2015-06-08 – 2015-06-11 (×4): 12.5 mg via ORAL
  Filled 2015-06-07 (×4): qty 1

## 2015-06-07 MED ORDER — SODIUM CHLORIDE 0.9% FLUSH: 3.0000 mL | INTRAVENOUS | Status: DC | PRN

## 2015-06-07 MED ORDER — OMEGA-3-ACID ETHYL ESTERS 1 G PO CAPS
1.0000 g | ORAL_CAPSULE | Freq: Every day | ORAL | Status: DC
Start: 2015-06-08 — End: 2015-06-11
  Administered 2015-06-08 – 2015-06-11 (×4): 1 g via ORAL
  Filled 2015-06-07 (×4): qty 1

## 2015-06-07 NOTE — H&P (Signed)
Cardiology Office Note   Date: 06/07/2015   ID: Denise [name], DOB [birth year], MRN [id number]  PCP: Daryl Eastern, MD Cardiologist: Dr Tresa Endo, Dr Chrisandra Netters, Denise [name]   Chief Complaint  Patient presents with  . Follow-up    pt c/o Dyspnea on exertion--last 3 weeks; stool is black--3 days; weakness    History of Present Illness: Denise [name] is a 82 y.o. female with a history of afib on coumadin, s/p AV node ablation and St Jude PPM, hypertensive CM, severe LA dilatation, EF 45-50% w/ mod-sev MR, mod PAH and sclerosis AoV.   Denise [name] presents for evaluation of weakness and DOE.   She has noticed gradually increasing DOE for the last few weeks. She does not weigh regularly but has not noticed any orthopnea or PND. She has not had LE edema. According to her weight today, she is within 3 lbs of her previous dry weight of 163 lbs. She is compliant with medications and denies dietary indiscretions. No recent new medications, vitamins, dietary changes.  She reports that her last 2 stools were black and tarry, 3 days ago and yesterday. This is new for her. She has also been weak.   Her INR was checked and is 3.4. Stool was melena in appearance and guaiac positive.   Past Medical History  Diagnosis Date  . Diabetes mellitus without complication (HCC)   . Thyroid disease   . Permanent atrial fibrillation (HCC)   . Hypertension   . CHF (congestive heart failure) (HCC)   . LVH (left ventricular hypertrophy)   . Mitral regurgitation     mod to severe  . Tricuspid regurgitation     severe  . Pulmonary hypertension (HCC)     severe    Past Surgical History  Procedure Laterality Date  . Pacemaker insertion  05/26/2004    St.Jude  . Abdominal hysterectomy    . Tubal ligation    . Cholecystectomy    . Thyroidectomy    . Knee replacement      R    . Compression hip screw Right 06/06/2012    Procedure: Open Reduction Internal Fixation Right HIp; Surgeon: Dannielle Huh, MD; Location: [facility] OR; Service: Orthopedics; Laterality: Right;  . Cardioversion  5 /18/ 2000    unsuccessful  . US echocardiography  02/25/2011    mod to severe MR,LA severe dilated,mild annular ca+,mod. TR,mod PI,mod ca+ of the aortic valve leaflet  . Nm myocar perf wall motion  02/28/2009    No signficant ischemia    Current Outpatient Prescriptions  Medication Sig Dispense Refill  . amLODipine (NORVASC) 5 MG tablet Take 1 tablet (5 mg total) by mouth every morning. 30 tablet 9  . atorvastatin (LIPITOR) 10 MG tablet Take 10 mg by mouth every evening.    . fish oil-omega-3 fatty acids 1000 MG capsule Take 1 g by mouth every morning.    . hydrochlorothiazide (HYDRODIURIL) 25 MG tablet Take 12.5 mg by mouth every morning.    . isosorbide mononitrate (IMDUR) 30 MG 24 hr tablet Take 1 tablet (30 mg total) by mouth every morning. 90 tablet 1  . methimazole (TAPAZOLE) 10 MG tablet Take 10 mg by mouth every morning.    . metoprolol succinate (TOPROL-XL) 50 MG 24 hr tablet Take 1 tablet (50 mg total) by mouth daily. Take with or immediately following a meal. 90 tablet 3  . potassium chloride SA (K-DUR,KLOR-CON) 20 MEQ tablet     . ramipril (  ALTACE) 5 MG capsule Take 1 capsule (5 mg total) by mouth daily. 30 capsule 6  . warfarin (COUMADIN) 5 MG tablet Take 1 to 1 & 1/2 tablets by mouth daily as directed. Check INR monthly 105 tablet 0   No current facility-administered medications for this visit.    Allergies: Iohexol and Shrimp    Social History: The patient  reports that she has never smoked. She does not have any smokeless tobacco history on file. She reports that she does not drink alcohol or use illicit drugs.   Family History: The patient's family history includes Asthma  in her mother; Cancer - Colon in her brother; Gallbladder disease in her maternal grandmother; Heart attack in her brother and father; Heart disease in her child; Stroke in her maternal grandfather and sister.    ROS: Please see the history of present illness. All other systems are reviewed and negative.    PHYSICAL EXAM: VS: BP 120/56 mmHg  Pulse 78  Ht  (1.549 m)  Wt 166 lb 6.4 oz (75.479 kg)  BMI 31.46 kg/m2 , BMI Body mass index is 31.46 kg/(m^2). GEN: Well nourished, well developed, female in no acute distress  HEENT: normal for age  Neck: JVD 10 cm but no sig HJR, no carotid bruit, no masses Cardiac: RRR; 3/6 murmur that radiates to carotids, no rubs, or gallops Respiratory: clear to auscultation bilaterally, normal work of breathing GI: soft, nontender, nondistended, + BS Denise: no deformity or atrophy; no edema; distal pulses are 2+ in all 4 extremities  Skin: warm and dry, no rash Neuro: Strength and sensation are intact Psych: euthymic mood, full affect   EKG: EKG is not ordered today.  Recent Labs: No results found for requested labs within last 365 days.    Lipid Panel  Labs (Brief)    No results found for: CHOL, TRIG, HDL, CHOLHDL, VLDL, LDLCALC, LDLDIRECT    Wt Readings from Last 3 Encounters:  06/07/15 166 lb 6.4 oz (75.479 kg)  04/11/15 162 lb (73.483 kg)  01/08/15 166 lb 14.4 oz (75.705 kg)     Other studies Reviewed: Additional studies/ records that were reviewed today include: office notes and testing.  ASSESSMENT AND PLAN: Denise [name] was seen by Dr Jens Som  1. Weakness: with heme + stools, pt needs to be admitted/transfused. Contact GI and arrange urgent consult.   2. Atrial fib: Seen by Denise [name] in January and was, doing well, St Jude PPM has been functioning well on all checks. She is 100% V paced, VVI mode.  3. Chronic anticoagulation: CHADS2VASC=5. INR 3.4, not extremely high. Her last INR was 3.6, also not extremely high.    4. GIB: melena with dark, tarry stools, heme +, GI referral, H&H will determine if transfusion needed.   Current medicines are reviewed at length with the patient today. The patient does not have concerns regarding medicines.  The following changes have been made: HOLD COUMADIN  Labs/ tests ordered today include:  Orders Placed This Encounter  Procedures  . CBC with Differential     Disposition: FU with Dr Tresa Endo   Signed, Denise Demark, Denise [name]  06/07/2015 8:40 AM  [facility] Phone: [phone]; Fax: [phone]  This note was written with the assistance of speech recognition software. Please excuse any transcriptional errors.  As above, patient seen and examined. Briefly she is an 82 year old female with past medical history of atrial fibrillation, prior AV node ablation status post St. Jude pacemaker  placement, hypertensive cardiomyopathy, pulmonary hypertension with GI bleed. Patient states that since Wednesday she has had melena. Over the past 3 weeks she notes increased dyspnea on exertion but no orthopnea, PND, pedal edema, chest pain or syncope. Rectal exam today by Denise [name] showed melena by report. We will admit and hold Coumadin. Treat with IV Protonix. Check hemoglobin and transfuse as needed. We will ask GI to consult. Resume Coumadin once all procedures complete and safe from a GI standpoint. Denise [name]

## 2015-06-07 NOTE — Patient Instructions (Signed)
Your physician recommends that you return for lab work NOW STAT.  Your physician recommends that you schedule a follow-up appointment PENDING LAB WORK RESULTS.

## 2015-06-07 NOTE — Progress Notes (Signed)
Cardiology Office Note   Date:  06/07/2015   ID:  Denise [name], DOB [birth year], MRN [id number]  PCP:  Daryl Eastern, MD  Cardiologist:  Dr Tresa Endo, Dr Chrisandra Netters, PA-C   Chief Complaint  Patient presents with  . Follow-up    pt c/o Dyspnea on exertion--last 3 weeks; stool is black--3 days; weakness    History of Present Illness: Denise [name] is a 82 y.o. female with a history of  afib on coumadin, s/p AV node ablation and St Jude PPM, hypertensive CM, severe LA dilatation, EF 45-50% w/ mod-sev MR, mod PAH and sclerosis AoV.   Denise [name] presents for evaluation of weakness and DOE.   She has noticed gradually increasing DOE for the last few weeks. She does not weigh regularly but has not noticed any orthopnea or PND. She has not had LE edema. According to her weight today, she is within 3 lbs of her previous dry weight of 163 lbs. She is compliant with medications and denies dietary indiscretions. No recent new medications, vitamins, dietary changes.  She reports that her last 2 stools were black and tarry, 3 days ago and yesterday. This is new for her. She has also been weak.   Her INR was checked and is 3.4. Stool was melena in appearance and guaiac positive.   Past Medical History  Diagnosis Date  . Diabetes mellitus without complication (HCC)   . Thyroid disease   . Permanent atrial fibrillation (HCC)   . Hypertension   . CHF (congestive heart failure) (HCC)   . LVH (left ventricular hypertrophy)   . Mitral regurgitation     mod to severe  . Tricuspid regurgitation     severe  . Pulmonary hypertension (HCC)     severe    Past Surgical History  Procedure Laterality Date  . Pacemaker insertion  05/26/2004    St.Jude  . Abdominal hysterectomy    . Tubal ligation    . Cholecystectomy    . Thyroidectomy    . Knee replacement      R  . Compression hip screw Right 06/06/2012    Procedure: Open Reduction Internal Fixation Right  HIp;  Surgeon: Dannielle Huh, MD;  Location: [facility] OR;  Service: Orthopedics;  Laterality: Right;  . Cardioversion  5 /18/ 2000    unsuccessful  . US echocardiography  02/25/2011    mod to severe MR,LA severe dilated,mild annular ca+,mod. TR,mod PI,mod ca+ of the aortic valve leaflet  . Nm myocar perf wall motion  02/28/2009    No signficant ischemia    Current Outpatient Prescriptions  Medication Sig Dispense Refill  . amLODipine (NORVASC) 5 MG tablet Take 1 tablet (5 mg total) by mouth every morning. 30 tablet 9  . atorvastatin (LIPITOR) 10 MG tablet Take 10 mg by mouth every evening.    . fish oil-omega-3 fatty acids 1000 MG capsule Take 1 g by mouth every morning.    . hydrochlorothiazide (HYDRODIURIL) 25 MG tablet Take 12.5 mg by mouth every morning.    . isosorbide mononitrate (IMDUR) 30 MG 24 hr tablet Take 1 tablet (30 mg total) by mouth every morning. 90 tablet 1  . methimazole (TAPAZOLE) 10 MG tablet Take 10 mg by mouth every morning.    . metoprolol succinate (TOPROL-XL) 50 MG 24 hr tablet Take 1 tablet (50 mg total) by mouth daily. Take with or immediately following a meal. 90 tablet 3  . potassium chloride SA (  K-DUR,KLOR-CON) 20 MEQ tablet     . ramipril (ALTACE) 5 MG capsule Take 1 capsule (5 mg total) by mouth daily. 30 capsule 6  . warfarin (COUMADIN) 5 MG tablet Take 1 to 1 & 1/2 tablets by mouth daily as directed.  Check INR monthly 105 tablet 0   No current facility-administered medications for this visit.    Allergies:   Iohexol and Shrimp    Social History:  The patient  reports that she has never smoked. She does not have any smokeless tobacco history on file. She reports that she does not drink alcohol or use illicit drugs.   Family History:  The patient's family history includes Asthma in her mother; Cancer - Colon in her brother; Gallbladder disease in her maternal grandmother; Heart attack in her brother and father; Heart disease in her child; Stroke in her maternal  grandfather and sister.    ROS:  Please see the history of present illness. All other systems are reviewed and negative.    PHYSICAL EXAM: VS:  BP 120/56 mmHg  Pulse 78  Ht 5\' 1"  (1.549 m)  Wt 166 lb 6.4 oz (75.479 kg)  BMI 31.46 kg/m2 , BMI Body mass index is 31.46 kg/(m^2). GEN: Well nourished, well developed, female in no acute distress HEENT: normal for age  Neck: JVD 10 cm but no sig HJR, no carotid bruit, no masses Cardiac: RRR; 3/6 murmur that radiates to carotids, no rubs, or gallops Respiratory:  clear to auscultation bilaterally, normal work of breathing GI: soft, nontender, nondistended, + BS Denise: no deformity or atrophy; no edema; distal pulses are 2+ in all 4 extremities  Skin: warm and dry, no rash Neuro:  Strength and sensation are intact Psych: euthymic mood, full affect   EKG:  EKG is not ordered today.  Recent Labs: No results found for requested labs within last 365 days.    Lipid Panel No results found for: CHOL, TRIG, HDL, CHOLHDL, VLDL, LDLCALC, LDLDIRECT   Wt Readings from Last 3 Encounters:  06/07/15 166 lb 6.4 oz (75.479 kg)  04/11/15 162 lb (73.483 kg)  01/08/15 166 lb 14.4 oz (75.705 kg)     Other studies Reviewed: Additional studies/ records that were reviewed today include: office notes and testing.  ASSESSMENT AND PLAN: Denise [name] was seen by Dr Jens Som  1.  Weakness: with heme + stools, pt needs to be admitted/transfused. Contact GI and arrange urgent consult.   2. Atrial fib: Seen by Nolon Rod in January and was, doing well, St Jude PPM has been functioning well on all checks. She is 100% V paced, VVI mode.  3. Chronic anticoagulation: CHADS2VASC=5.  INR 3.4, not extremely high. Her last INR was 3.6, also not extremely high.   4. GIB: melena with dark, tarry stools, heme +, GI referral, H&H will determine if transfusion needed.   Current medicines are reviewed at length with the patient today.  The patient does not have concerns  regarding medicines.  The following changes have been made:  HOLD COUMADIN  Labs/ tests ordered today include:   Orders Placed This Encounter  Procedures  . CBC with Differential     Disposition:   FU with Dr Tresa Endo   Signed, Theodore Demark, PA-C  06/07/2015 8:40 AM    [facility] Phone: [phone]; Fax: [phone]  This note was written with the assistance of speech recognition software. Please excuse any transcriptional errors.  As above, patient seen and examined. Briefly  she is an 82 year old female with past medical history of atrial fibrillation, prior AV node ablation status post St. Jude pacemaker placement, hypertensive cardiomyopathy, pulmonary hypertension with GI bleed. Patient states that since Wednesday she has had melena. Over the past 3 weeks she notes increased dyspnea on exertion but no orthopnea, PND, pedal edema, chest pain or syncope. Rectal exam today by Franchot Mimes showed melena by report. We will admit and hold Coumadin. Treat with IV Protonix. Check hemoglobin and transfuse as needed. We will ask GI to consult. Resume Coumadin once all procedures complete and safe from a GI standpoint. Olga Millers

## 2015-06-08 DIAGNOSIS — K921 Melena: Secondary | ICD-10-CM | POA: Diagnosis not present

## 2015-06-08 LAB — CBC
HEMATOCRIT: 38.4 % (ref 36.0–46.0)
Hemoglobin: 12 g/dL (ref 12.0–15.0)
MCH: 27.3 pg (ref 26.0–34.0)
MCHC: 31.3 g/dL (ref 30.0–36.0)
MCV: 87.5 fL (ref 78.0–100.0)
PLATELETS: 150 10*3/uL (ref 150–400)
RBC: 4.39 MIL/uL (ref 3.87–5.11)
RDW: 16.4 % — AB (ref 11.5–15.5)
WBC: 5.9 10*3/uL (ref 4.0–10.5)

## 2015-06-08 LAB — PROTIME-INR
INR: 2.79 — ABNORMAL HIGH (ref 0.00–1.49)
Prothrombin Time: 29 seconds — ABNORMAL HIGH (ref 11.6–15.2)

## 2015-06-08 LAB — HEMOGLOBIN A1C
HEMOGLOBIN A1C: 6.5 % — AB (ref 4.8–5.6)
MEAN PLASMA GLUCOSE: 140 mg/dL

## 2015-06-08 LAB — OCCULT BLOOD X 1 CARD TO LAB, STOOL: Fecal Occult Bld: POSITIVE — AB

## 2015-06-08 MED ORDER — PANTOPRAZOLE SODIUM 40 MG IV SOLR
40.0000 mg | Freq: Two times a day (BID) | INTRAVENOUS | Status: DC
  Administered 2015-06-08 (×2): 40 mg via INTRAVENOUS
  Filled 2015-06-08 (×2): qty 40

## 2015-06-08 MED ORDER — SODIUM CHLORIDE 0.9 % IV SOLN
INTRAVENOUS | Status: DC
  Administered 2015-06-09: 500 mL via INTRAVENOUS

## 2015-06-08 NOTE — Progress Notes (Signed)
Patient has had no stools or s/s of bleeding through the night. [facility] [employer]

## 2015-06-08 NOTE — Progress Notes (Signed)
Subjective:  No complaints of shortness of breath today.  He was admitted with dyspnea as well as some GI bleeding.  Objective:  Vital Signs in the last 24 hours: BP 120/64 mmHg  Pulse 70  Temp(Src) 98 F (36.7 C) (Oral)  Resp 22  Ht 5\' 1"  (1.549 m)  Wt 74.526 kg (164 lb 4.8 oz)  BMI 31.06 kg/m2  SpO2 97%  Physical Exam: Elderly female lying in bed in no acute distress Lungs:  Clear  Cardiac:  Regular rhythm, normal S1 and S2, no S3, 1 to 2/6 murmur Extremities:  No edema present  Intake/Output from previous day: 03/24 0701 - 03/25 0700 In: -  Out: 650 [Urine:650] Weight Filed Weights   06/07/15 1218 06/08/15 0400  Weight: 74.889 kg (165 lb 1.6 oz) 74.526 kg (164 lb 4.8 oz)    Lab Results: Basic Metabolic Panel:  Recent Labs  20/94/70 1306  NA 138  K 3.6  CL 101  CO2 27  GLUCOSE 107*  BUN 28*  CREATININE 1.19*    CBC:  Recent Labs  06/07/15 0906 06/07/15 1306 06/08/15 0343  WBC 6.0 7.0 5.9  NEUTROABS 3.5 4.0  --   HGB 13.0 13.9 12.0  HCT 40.6 42.7 38.4  MCV 87.9 87.1 87.5  PLT 152 158 150   PROTIME: Lab Results  Component Value Date   INR 3.4 06/07/2015   INR 3.1* 04/26/2015   INR 3.6 04/11/2015    Telemetry: Paced rhythm  Assessment/Plan:  1.  Reported GI bleeding but minimal drop in hemoglobin 2.  Dyspnea appears stable 3.  Hypertensive cardiomyopathy  Recommendations:  Recheck INR today.  I asked [facility] to see the patient.  She reports no previous gastroenterologist.     Darden Palmer  MD [facility]  06/08/2015, 8:55 AM

## 2015-06-08 NOTE — Consult Note (Signed)
Referring Provider: Dr. Crenshaw Primary Care Physician:  Eller, Chrystal Faye, MD Primary Gastroenterologist:  Unassigned  Reason for Consultation:  Melena  HPI: Denise [name] is a 82 y.o. female who had the onset of black stools this past Wednesday that happened daily since then for several times without any red blood, abdominal pain, hematemesis, nausea/vomiting, lightheadedness, or dizziness. Has been having weakness and dyspnea on exertion. Remote history of black stool years ago per her son but neither he nor the patient know the details of that. He reports her having a colon perforation from a colonoscopy 15 years ago but does not recall the details. She is on chronic Coumadin for Afib. Denies NSAIDs. Denies heartburn. Rare dysphagia to cornbread with milk but otherwise no dysphagia. INR 3.4.   Past Medical History  Diagnosis Date  . Diabetes mellitus without complication (HCC)   . Thyroid disease   . Permanent atrial fibrillation (HCC)   . Hypertension   . CHF (congestive heart failure) (HCC)   . LVH (left ventricular hypertrophy)   . Mitral regurgitation     mod to severe  . Tricuspid regurgitation     severe  . Pulmonary hypertension (HCC)     severe    Past Surgical History  Procedure Laterality Date  . Pacemaker insertion  05/26/2004    St.Jude  . Abdominal hysterectomy    . Tubal ligation    . Cholecystectomy    . Thyroidectomy    . Knee replacement      R  . Compression hip screw Right 06/06/2012    Procedure: Open Reduction Internal Fixation Right HIp;  Surgeon: Steve Lucey, MD;  Location: [facility] OR;  Service: Orthopedics;  Laterality: Right;  . Cardioversion  5 /18/ 2000    unsuccessful  . Us echocardiography  02/25/2011    mod to severe MR,LA severe dilated,mild annular ca+,mod. TR,mod PI,mod ca+ of the aortic valve leaflet  . Nm myocar perf wall motion  02/28/2009    No signficant ischemia    Prior to Admission medications   Medication Sig Start Date End  Date Taking? Authorizing Provider  amLODipine (NORVASC) 5 MG tablet Take 1 tablet (5 mg total) by mouth every morning. 09/25/13   Mihai Croitoru, MD  atorvastatin (LIPITOR) 10 MG tablet Take 10 mg by mouth every evening.    Historical Provider, MD  fish oil-omega-3 fatty acids 1000 MG capsule Take 1 g by mouth every morning.    Historical Provider, MD  hydrochlorothiazide (HYDRODIURIL) 25 MG tablet Take 12.5 mg by mouth every morning.    Historical Provider, MD  isosorbide mononitrate (IMDUR) 30 MG 24 hr tablet Take 1 tablet (30 mg total) by mouth every morning. 03/25/15   Mihai Croitoru, MD  methimazole (TAPAZOLE) 10 MG tablet Take 10 mg by mouth every morning.    Historical Provider, MD  metoprolol succinate (TOPROL-XL) 50 MG 24 hr tablet Take 1 tablet (50 mg total) by mouth daily. Take with or immediately following a meal. 01/16/15   Mihai Croitoru, MD  potassium chloride SA (K-DUR,KLOR-CON) 20 MEQ tablet  03/22/15   Historical Provider, MD  ramipril (ALTACE) 5 MG capsule Take 1 capsule (5 mg total) by mouth daily. 05/06/15   Thomas A Kelly, MD  warfarin (COUMADIN) 5 MG tablet Take 1 to 1 & 1/2 tablets by mouth daily as directed.  Check INR monthly 05/23/15   Thomas A Kelly, MD    Scheduled Meds: . amLODipine  5 mg Oral q morning - 10a  .   atorvastatin  10 mg Oral QPM  . hydrochlorothiazide  12.5 mg Oral q morning - 10a  . isosorbide mononitrate  30 mg Oral q morning - 10a  . methimazole  10 mg Oral q morning - 10a  . metoprolol succinate  50 mg Oral Daily  . omega-3 acid ethyl esters  1 g Oral Daily  . potassium chloride SA  20 mEq Oral Daily  . ramipril  5 mg Oral Daily  . sodium chloride flush  3 mL Intravenous Q12H   Continuous Infusions:  PRN Meds:.sodium chloride, acetaminophen, ALPRAZolam, nitroGLYCERIN, ondansetron (ZOFRAN) IV, sodium chloride flush, zolpidem  Allergies as of 06/07/2015 - Review Complete 06/07/2015  Allergen Reaction Noted  . Iohexol Rash 05/24/2004  . Shrimp  [shellfish allergy] Nausea And Vomiting and Rash 06/04/2012    Family History  Problem Relation Age of Onset  . Asthma Mother   . Heart attack Father   . Stroke Sister   . Heart attack Brother   . Cancer - Colon Brother   . Gallbladder disease Maternal Grandmother   . Stroke Maternal Grandfather   . Heart disease Child     Social History   Social History  . Marital Status: Widowed    Spouse Name: N/A  . Number of Children: N/A  . Years of Education: N/A   Occupational History  . Not on file.   Social History Main Topics  . Smoking status: Never Smoker   . Smokeless tobacco: Not on file  . Alcohol Use: No  . Drug Use: No  . Sexual Activity: No   Other Topics Concern  . Not on file   Social History Narrative    Review of Systems: All negative except as stated above in HPI.  Physical Exam: Vital signs: Filed Vitals:   06/08/15 0400 06/08/15 0820  BP: 135/65 120/64  Pulse:  70  Temp: 98.3 F (36.8 C) 98 F (36.7 C)  Resp: 19 22   Last BM Date: 06/07/15 General:   Alert, elderly, Well-developed, well-nourished, pleasant and cooperative in NAD Head: atraumatic Eyes: pupils equal and reactive, anicteric sclera ENT: poor dentition Lungs:  Clear throughout to auscultation.   No wheezes, crackles, or rhonchi. No acute distress. Heart:  Regular rate and rhythm; no murmurs, clicks, rubs,  or gallops. Abdomen: soft, nontender, nondistended, +BS  Rectal:  Deferred Ext: no edema  GI:  Lab Results:  Recent Labs  06/07/15 0906 06/07/15 1306 06/08/15 0343  WBC 6.0 7.0 5.9  HGB 13.0 13.9 12.0  HCT 40.6 42.7 38.4  PLT 152 158 150   BMET  Recent Labs  06/07/15 1306  NA 138  K 3.6  CL 101  CO2 27  GLUCOSE 107*  BUN 28*  CREATININE 1.19*  CALCIUM 9.2   LFT  Recent Labs  06/07/15 1306  PROT 7.7  ALBUMIN 4.3  AST 25  ALT 21  ALKPHOS 82  BILITOT 1.7*   PT/INR  Recent Labs  06/07/15 0819  INR 3.4     Studies/Results: No results  found.  Impression/Plan: 82 yo with several days of melena in the setting of Coumadin (INR 3.4 on admit) concerning for an upper GI bleed. No evidence of ongoing bleeding. Small black stool yesterday in hospital. Question peptic ulcer bleed vs. Mucosal bleeding from elevated INR vs. Gastritis vs. Esophagitis. Malignancy possible but less likely. Repeat INR pending. PPI IV 40 mg Q 12 hours. Clear liquid diet this afternoon. NPO p MN. EGD tomorrow morning. Continue supportive care.      LOS: 1 day   Geral Coker C.  06/08/2015, 9:38 AM  Pager [phone]  If no answer or after 5 PM call [phone] 

## 2015-06-09 ENCOUNTER — Encounter (HOSPITAL_COMMUNITY)
Admission: AD | Disposition: A | Discharge status: Home / Self Care | Payer: Self-pay | Source: Ambulatory Visit | Attending: Cardiology

## 2015-06-09 ENCOUNTER — Encounter (HOSPITAL_COMMUNITY): Payer: Self-pay | Admitting: Gastroenterology

## 2015-06-09 DIAGNOSIS — K921 Melena: Secondary | ICD-10-CM | POA: Diagnosis not present

## 2015-06-09 HISTORY — PX: ESOPHAGOGASTRODUODENOSCOPY: SHX5428

## 2015-06-09 LAB — CBC
HEMATOCRIT: 37.2 % (ref 36.0–46.0)
HEMOGLOBIN: 12.1 g/dL (ref 12.0–15.0)
MCH: 28.4 pg (ref 26.0–34.0)
MCHC: 32.5 g/dL (ref 30.0–36.0)
MCV: 87.3 fL (ref 78.0–100.0)
Platelets: 134 10*3/uL — ABNORMAL LOW (ref 150–400)
RBC: 4.26 MIL/uL (ref 3.87–5.11)
RDW: 16.1 % — ABNORMAL HIGH (ref 11.5–15.5)
WBC: 4.8 10*3/uL (ref 4.0–10.5)

## 2015-06-09 LAB — BRAIN NATRIURETIC PEPTIDE: B NATRIURETIC PEPTIDE 5: 167.3 pg/mL — AB (ref 0.0–100.0)

## 2015-06-09 LAB — PROTIME-INR
INR: 2.56 — AB (ref 0.00–1.49)
PROTHROMBIN TIME: 27.2 s — AB (ref 11.6–15.2)

## 2015-06-09 SURGERY — EGD (ESOPHAGOGASTRODUODENOSCOPY)
Anesthesia: Moderate Sedation

## 2015-06-09 MED ORDER — BUTAMBEN-TETRACAINE-BENZOCAINE 2-2-14 % EX AERO
INHALATION_SPRAY | CUTANEOUS | Status: DC | PRN
  Administered 2015-06-09: 2 via TOPICAL

## 2015-06-09 MED ORDER — MIDAZOLAM HCL 5 MG/ML IJ SOLN
INTRAMUSCULAR | Status: AC
  Filled 2015-06-09: qty 2

## 2015-06-09 MED ORDER — MIDAZOLAM HCL 10 MG/2ML IJ SOLN
INTRAMUSCULAR | Status: DC | PRN
  Administered 2015-06-09 (×2): 2 mg via INTRAVENOUS

## 2015-06-09 MED ORDER — FENTANYL CITRATE (PF) 100 MCG/2ML IJ SOLN
INTRAMUSCULAR | Status: DC | PRN
  Administered 2015-06-09 (×2): 25 ug via INTRAVENOUS

## 2015-06-09 MED ORDER — PANTOPRAZOLE SODIUM 40 MG IV SOLR
40.0000 mg | INTRAVENOUS | Status: DC
  Administered 2015-06-10: 40 mg via INTRAVENOUS
  Filled 2015-06-09 (×2): qty 40

## 2015-06-09 MED ORDER — FENTANYL CITRATE (PF) 100 MCG/2ML IJ SOLN
INTRAMUSCULAR | Status: AC
  Filled 2015-06-09: qty 2

## 2015-06-09 NOTE — Op Note (Signed)
[facility] Patient Name: Denise [name] Procedure Date : 06/09/2015 MRN: [id number] Attending MD: Shirley Friar , MD Date of Birth: 07-29-1923 CSN: [id number] Age: 82 Admit Type: Inpatient Procedure:                Upper GI endoscopy Indications:              Melena Providers:                Shirley Friar, MD, Michel Bickers, RN, Lorenda Ishihara, Technician Referring MD:              Medicines:                Fentanyl 50 micrograms IV, Midazolam 4 mg IV,                            Cetacaine spray Complications:            No immediate complications. Estimated Blood Loss:     Estimated blood loss: none. Procedure:                Pre-Anesthesia Assessment:                           - Prior to the procedure, a History and Physical                            was performed, and patient medications and                            allergies were reviewed. The patient's tolerance of                            previous anesthesia was also reviewed. The risks                            and benefits of the procedure and the sedation                            options and risks were discussed with the patient.                            All questions were answered, and informed consent                            was obtained. Prior Anticoagulants: The patient has                            taken Coumadin (warfarin), last dose was 3 days                            prior to procedure. ASA Grade Assessment: III - A  patient with severe systemic disease. After                            reviewing the risks and benefits, the patient was                            deemed in satisfactory condition to undergo the                            procedure.                           After obtaining informed consent, the endoscope was                            passed under direct vision. Throughout the                             procedure, the patient's blood pressure, pulse, and                            oxygen saturations were monitored continuously. The                            [id number] ([id number]) scope was introduced through the                            mouth, and advanced to the second part of duodenum.                            The upper GI endoscopy was accomplished without                            difficulty. The patient tolerated the procedure                            well. Scope In: Scope Out: Findings:      The oropharynx was normal.      LA Grade A (one or more mucosal breaks less than 5 mm, not extending       between tops of 2 mucosal folds) esophagitis with no bleeding was found       at the gastroesophageal junction.      The exam of the esophagus was otherwise normal.      A small hiatal hernia was present.      The exam of the stomach was otherwise normal.      The examined duodenum was normal. Impression:               - Normal oropharynx.                           - LA Grade A reflux esophagitis.                           - Small hiatal hernia.                           -  Normal examined duodenum.                           - Melena likely secondary to mucosal bleeding from                            anticoagulation.                           - No specimens collected. Moderate Sedation:      Moderate (conscious) sedation was administered by the endoscopy nurse       and supervised by the endoscopist. The following parameters were       monitored: oxygen saturation, heart rate, blood pressure, and response       to care. Recommendation:           - Use Protonix (pantoprazole) 40 mg PO daily.                           - Clear liquid diet.                           - Advance diet as tolerated.                           - Post procedure medication orders were given. Procedure Code(s):        --- Professional ---                           [phone], Esophagogastroduodenoscopy, flexible,                             transoral; diagnostic, including collection of                            specimen(s) by brushing or washing, when performed                            (separate procedure) Diagnosis Code(s):        --- Professional ---                           K21.0, Gastro-esophageal reflux disease with                            esophagitis                           K92.1, Melena (includes Hematochezia)                           K44.9, Diaphragmatic hernia without obstruction or                            gangrene CPT copyright 2016 [employer]. All rights reserved. The codes documented in this report are preliminary and upon coder review may  be revised to meet current compliance requirements. Charlott Rakes, MD Shirley Friar, MD 06/09/2015 9:47:07 AM  This report has been signed electronically. Number of Addenda: 0

## 2015-06-09 NOTE — H&P (View-Only) (Signed)
Referring Provider: Dr. Jens Som Primary Care Physician:  Daryl Eastern, MD Primary Gastroenterologist:  Gentry Fitz  Reason for Consultation:  Melena  HPI: Denise [name] is a 82 y.o. female who had the onset of black stools this past Wednesday that happened daily since then for several times without any red blood, abdominal pain, hematemesis, nausea/vomiting, lightheadedness, or dizziness. Has been having weakness and dyspnea on exertion. Remote history of black stool years ago per her son but neither he nor the patient know the details of that. He reports her having a colon perforation from a colonoscopy 15 years ago but does not recall the details. She is on chronic Coumadin for Afib. Denies NSAIDs. Denies heartburn. Rare dysphagia to cornbread with milk but otherwise no dysphagia. INR 3.4.   Past Medical History  Diagnosis Date  . Diabetes mellitus without complication (HCC)   . Thyroid disease   . Permanent atrial fibrillation (HCC)   . Hypertension   . CHF (congestive heart failure) (HCC)   . LVH (left ventricular hypertrophy)   . Mitral regurgitation     mod to severe  . Tricuspid regurgitation     severe  . Pulmonary hypertension (HCC)     severe    Past Surgical History  Procedure Laterality Date  . Pacemaker insertion  05/26/2004    St.Jude  . Abdominal hysterectomy    . Tubal ligation    . Cholecystectomy    . Thyroidectomy    . Knee replacement      R  . Compression hip screw Right 06/06/2012    Procedure: Open Reduction Internal Fixation Right HIp;  Surgeon: Dannielle Huh, MD;  Location: [facility] OR;  Service: Orthopedics;  Laterality: Right;  . Cardioversion  5 /18/ 2000    unsuccessful  . US echocardiography  02/25/2011    mod to severe MR,LA severe dilated,mild annular ca+,mod. TR,mod PI,mod ca+ of the aortic valve leaflet  . Nm myocar perf wall motion  02/28/2009    No signficant ischemia    Prior to Admission medications   Medication Sig Start Date End  Date Taking? Authorizing Provider  amLODipine (NORVASC) 5 MG tablet Take 1 tablet (5 mg total) by mouth every morning. 09/25/13   Mihai Croitoru, MD  atorvastatin (LIPITOR) 10 MG tablet Take 10 mg by mouth every evening.    Historical Provider, MD  fish oil-omega-3 fatty acids 1000 MG capsule Take 1 g by mouth every morning.    Historical Provider, MD  hydrochlorothiazide (HYDRODIURIL) 25 MG tablet Take 12.5 mg by mouth every morning.    Historical Provider, MD  isosorbide mononitrate (IMDUR) 30 MG 24 hr tablet Take 1 tablet (30 mg total) by mouth every morning. 03/25/15   Mihai Croitoru, MD  methimazole (TAPAZOLE) 10 MG tablet Take 10 mg by mouth every morning.    Historical Provider, MD  metoprolol succinate (TOPROL-XL) 50 MG 24 hr tablet Take 1 tablet (50 mg total) by mouth daily. Take with or immediately following a meal. 01/16/15   Mihai Croitoru, MD  potassium chloride SA (K-DUR,KLOR-CON) 20 MEQ tablet  03/22/15   Historical Provider, MD  ramipril (ALTACE) 5 MG capsule Take 1 capsule (5 mg total) by mouth daily. 05/06/15   Lennette Bihari, MD  warfarin (COUMADIN) 5 MG tablet Take 1 to 1 & 1/2 tablets by mouth daily as directed.  Check INR monthly 05/23/15   Lennette Bihari, MD    Scheduled Meds: . amLODipine  5 mg Oral q morning - 10a  .  atorvastatin  10 mg Oral QPM  . hydrochlorothiazide  12.5 mg Oral q morning - 10a  . isosorbide mononitrate  30 mg Oral q morning - 10a  . methimazole  10 mg Oral q morning - 10a  . metoprolol succinate  50 mg Oral Daily  . omega-3 acid ethyl esters  1 g Oral Daily  . potassium chloride SA  20 mEq Oral Daily  . ramipril  5 mg Oral Daily  . sodium chloride flush  3 mL Intravenous Q12H   Continuous Infusions:  PRN Meds:.sodium chloride, acetaminophen, ALPRAZolam, nitroGLYCERIN, ondansetron (ZOFRAN) IV, sodium chloride flush, zolpidem  Allergies as of 06/07/2015 - Review Complete 06/07/2015  Allergen Reaction Noted  . Iohexol Rash 05/24/2004  . Shrimp  [shellfish allergy] Nausea And Vomiting and Rash 06/04/2012    Family History  Problem Relation Age of Onset  . Asthma Mother   . Heart attack Father   . Stroke Sister   . Heart attack Brother   . Cancer - Colon Brother   . Gallbladder disease Maternal Grandmother   . Stroke Maternal Grandfather   . Heart disease Child     Social History   Social History  . Marital Status: Widowed    Spouse Name: N/A  . Number of Children: N/A  . Years of Education: N/A   Occupational History  . Not on file.   Social History Main Topics  . Smoking status: Never Smoker   . Smokeless tobacco: Not on file  . Alcohol Use: No  . Drug Use: No  . Sexual Activity: No   Other Topics Concern  . Not on file   Social History Narrative    Review of Systems: All negative except as stated above in HPI.  Physical Exam: Vital signs: Filed Vitals:   06/08/15 0400 06/08/15 0820  BP: 135/65 120/64  Pulse:  70  Temp: 98.3 F (36.8 C) 98 F (36.7 C)  Resp: 19 22   Last BM Date: 06/07/15 General:   Alert, elderly, Well-developed, well-nourished, pleasant and cooperative in NAD Head: atraumatic Eyes: pupils equal and reactive, anicteric sclera ENT: poor dentition Lungs:  Clear throughout to auscultation.   No wheezes, crackles, or rhonchi. No acute distress. Heart:  Regular rate and rhythm; no murmurs, clicks, rubs,  or gallops. Abdomen: soft, nontender, nondistended, +BS  Rectal:  Deferred Ext: no edema  GI:  Lab Results:  Recent Labs  06/07/15 0906 06/07/15 1306 06/08/15 0343  WBC 6.0 7.0 5.9  HGB 13.0 13.9 12.0  HCT 40.6 42.7 38.4  PLT 152 158 150   BMET  Recent Labs  06/07/15 1306  NA 138  K 3.6  CL 101  CO2 27  GLUCOSE 107*  BUN 28*  CREATININE 1.19*  CALCIUM 9.2   LFT  Recent Labs  06/07/15 1306  PROT 7.7  ALBUMIN 4.3  AST 25  ALT 21  ALKPHOS 82  BILITOT 1.7*   PT/INR  Recent Labs  06/07/15 0819  INR 3.4     Studies/Results: No results  found.  Impression/Plan: 82 yo with several days of melena in the setting of Coumadin (INR 3.4 on admit) concerning for an upper GI bleed. No evidence of ongoing bleeding. Small black stool yesterday in hospital. Question peptic ulcer bleed vs. Mucosal bleeding from elevated INR vs. Gastritis vs. Esophagitis. Malignancy possible but less likely. Repeat INR pending. PPI IV 40 mg Q 12 hours. Clear liquid diet this afternoon. NPO p MN. EGD tomorrow morning. Continue supportive care.  LOS: 1 day   Gerrard Crystal C.  06/08/2015, 9:38 AM  Pager [phone]  If no answer or after 5 PM call [phone]

## 2015-06-09 NOTE — Progress Notes (Signed)
Subjective:  Appreciate GI evaluation.  She is to have endoscopy today.  She complains today of shortness of breath.  She attributes it to a front coming through.  Objective:  Vital Signs in the last 24 hours: BP 112/54 mmHg  Pulse 70  Temp(Src) 97.6 F (36.4 C) (Oral)  Resp 18  Ht 5\' 1"  (1.549 m)  Wt 74 kg (163 lb 2.3 oz)  BMI 30.84 kg/m2  SpO2 93%  Physical Exam: Elderly female lying in bed in no acute distress Lungs:  Minimal wheeze heard Cardiac:  Regular rhythm, normal S1 and S2, no S3, 1 to 2/6 murmur Extremities:  No edema present  Intake/Output from previous day: 03/25 0701 - 03/26 0700 In: 480 [P.O.:480] Out: 400 [Urine:400] Weight Filed Weights   06/07/15 1218 06/08/15 0400 06/09/15 0400  Weight: 74.889 kg (165 lb 1.6 oz) 74.526 kg (164 lb 4.8 oz) 74 kg (163 lb 2.3 oz)    Lab Results: Basic Metabolic Panel:  Recent Labs  14/48/18 1306  NA 138  K 3.6  CL 101  CO2 27  GLUCOSE 107*  BUN 28*  CREATININE 1.19*    CBC:  Recent Labs  06/07/15 0906 06/07/15 1306 06/08/15 0343 06/09/15 0511  WBC 6.0 7.0 5.9 4.8  NEUTROABS 3.5 4.0  --   --   HGB 13.0 13.9 12.0 12.1  HCT 40.6 42.7 38.4 37.2  MCV 87.9 87.1 87.5 87.3  PLT 152 158 150 134*   PROTIME: Lab Results  Component Value Date   INR 2.56* 06/09/2015   INR 2.79* 06/08/2015   INR 3.4 06/07/2015    Telemetry: Paced rhythm  Assessment/Plan:  1.  Reported GI bleeding but minimal drop in hemoglobin 2.  Dyspnea appears worse today-unclear the cause. 3.  Hypertensive cardiomyopathy  Recommendations:  Check BNP today and I'm going to go ahead and get an echo today to evaluate the dyspnea.  Awaiting results of upper endoscopy.  Allow INR to drift down a little further.      Darden Palmer  MD [facility]  06/09/2015, 7:57 AM

## 2015-06-09 NOTE — Brief Op Note (Signed)
Minimal erosive esophagitis and small hiatal hernia otherwise normal EGD. No blood products seen. Suspect black stools came from mucosal bleeding on anticoagulation. Hgb 12. Would not recommend a colonoscopy due to advanced age and comorbidities. No further GI workup. Ok to resume anticoagulation. Soft food and advance diet. Change to PPI 40 mg PO QD prior to discharge. Will sign off. Call if questions. F/U with GI as needed.

## 2015-06-09 NOTE — Interval H&P Note (Signed)
History and Physical Interval Note:  06/09/2015 9:29 AM  Denise [name]  has presented today for surgery, with the diagnosis of melena  The various methods of treatment have been discussed with the patient and family. After consideration of risks, benefits and other options for treatment, the patient has consented to  Procedure(s): ESOPHAGOGASTRODUODENOSCOPY (EGD) (N/A) as a surgical intervention .  The patient's history has been reviewed, patient examined, no change in status, stable for surgery.  I have reviewed the patient's chart and labs.  Questions were answered to the patient's satisfaction.     Hays Dunnigan C.

## 2015-06-10 ENCOUNTER — Inpatient Hospital Stay (HOSPITAL_COMMUNITY): Payer: Medicare Other

## 2015-06-10 DIAGNOSIS — R0600 Dyspnea, unspecified: Secondary | ICD-10-CM

## 2015-06-10 DIAGNOSIS — K921 Melena: Secondary | ICD-10-CM | POA: Diagnosis not present

## 2015-06-10 DIAGNOSIS — I482 Chronic atrial fibrillation: Secondary | ICD-10-CM | POA: Diagnosis not present

## 2015-06-10 LAB — ECHOCARDIOGRAM COMPLETE
HEIGHTINCHES: 61 in
WEIGHTICAEL: 2627.88 [oz_av]

## 2015-06-10 LAB — CBC
HEMATOCRIT: 37.9 % (ref 36.0–46.0)
Hemoglobin: 11.7 g/dL — ABNORMAL LOW (ref 12.0–15.0)
MCH: 27 pg (ref 26.0–34.0)
MCHC: 30.9 g/dL (ref 30.0–36.0)
MCV: 87.3 fL (ref 78.0–100.0)
PLATELETS: 138 10*3/uL — AB (ref 150–400)
RBC: 4.34 MIL/uL (ref 3.87–5.11)
RDW: 16 % — AB (ref 11.5–15.5)
WBC: 4.7 10*3/uL (ref 4.0–10.5)

## 2015-06-10 LAB — PROTIME-INR
INR: 2.05 — AB (ref 0.00–1.49)
Prothrombin Time: 23 seconds — ABNORMAL HIGH (ref 11.6–15.2)

## 2015-06-10 MED ORDER — WARFARIN - PHARMACIST DOSING INPATIENT
Freq: Every day | Status: DC
  Administered 2015-06-10: 18:00:00

## 2015-06-10 MED ORDER — WARFARIN SODIUM 5 MG PO TABS
6.0000 mg | ORAL_TABLET | Freq: Once | ORAL | Status: AC
  Administered 2015-06-10: 6 mg via ORAL
  Filled 2015-06-10: qty 1

## 2015-06-10 NOTE — Progress Notes (Signed)
Patient Name: Denise [name] Date of Encounter: 06/10/2015  Active Problems:   Atrial fibrillation (HCC)   GIB (gastrointestinal bleeding)   Primary Cardiologist: Dr. Tresa Endo Patient Profile:Denise [name] is a 82 y.o. female with a history of afib on coumadin, s/p AV node ablation and St Jude PPM, hypertensive CM, severe LA dilatation, EF 45-50% w/ mod-sev MR, mod PAH and sclerosis AoV. She had worsening DOE over the past few weeks. Had guaiac positive stools.  Had normal EGD on 06/09/15, bleeding most likely due to mucosal bleeding.      SUBJECTIVE: She denies SOB, chest pain.    OBJECTIVE Filed Vitals:   06/09/15 2205 06/10/15 0200 06/10/15 0358 06/10/15 0756  BP: 126/64 115/63    Pulse: 71 70 70 70  Temp:   97.8 F (36.6 C)   TempSrc:   Oral   Resp:  17 17 15   Height:      Weight:   164 lb 3.9 oz (74.5 kg)   SpO2:  96% 96% 98%    Intake/Output Summary (Last 24 hours) at 06/10/15 1022 Last data filed at 06/10/15 0923  Gross per 24 hour  Intake    480 ml  Output    550 ml  Net    -70 ml   Filed Weights   06/08/15 0400 06/09/15 0400 06/10/15 0358  Weight: 164 lb 4.8 oz (74.526 kg) 163 lb 2.3 oz (74 kg) 164 lb 3.9 oz (74.5 kg)    PHYSICAL EXAM General: Well developed, well nourished, female in no acute distress. Head: Normocephalic, atraumatic.  Neck: Supple without bruits, JVD. Lungs:  Resp regular and unlabored, CTA. Heart: RRR, S1, S2, no S3, S4, or murmur; no rub. Abdomen: Soft, non-tender, non-distended, BS + x 4.  Extremities: No clubbing, cyanosis, edema.  Neuro: Alert and oriented X 3. Moves all extremities spontaneously. Psych: Normal affect.  LABS: CBC: Recent Labs  06/07/15 1306  06/09/15 0511 06/10/15 0338  WBC 7.0  < > 4.8 4.7  NEUTROABS 4.0  --   --   --   HGB 13.9  < > 12.1 11.7*  HCT 42.7  < > 37.2 37.9  MCV 87.1  < > 87.3 87.3  PLT 158  < > 134* 138*  < > = values in this interval not displayed. INR: Recent Labs   06/10/15 0338  INR 2.05*   Basic Metabolic Panel: Recent Labs  06/07/15 1306  NA 138  K 3.6  CL 101  CO2 27  GLUCOSE 107*  BUN 28*  CREATININE 1.19*  CALCIUM 9.2   Liver Function Tests: Recent Labs  06/07/15 1306  AST 25  ALT 21  ALKPHOS 82  BILITOT 1.7*  PROT 7.7  ALBUMIN 4.3   BNP:  B NATRIURETIC PEPTIDE  Date/Time Value Ref Range Status  06/09/2015 08:35 AM 167.3* 0.0 - 100.0 pg/mL Final    Hemoglobin A1C: Recent Labs  06/07/15 1306  HGBA1C 6.5*   Thyroid Function Tests: Recent Labs  06/07/15 1306  TSH 5.806*    TELE: v paced.         Current Medications:  . amLODipine  5 mg Oral q morning - 10a  . atorvastatin  10 mg Oral QPM  . hydrochlorothiazide  12.5 mg Oral q morning - 10a  . isosorbide mononitrate  30 mg Oral q morning - 10a  . methimazole  10 mg Oral q morning - 10a  . metoprolol succinate  50 mg Oral Daily  .  omega-3 acid ethyl esters  1 g Oral Daily  . pantoprazole (PROTONIX) IV  40 mg Intravenous Q24H  . potassium chloride SA  20 mEq Oral Daily  . ramipril  5 mg Oral Daily  . sodium chloride flush  3 mL Intravenous Q12H      ASSESSMENT AND PLAN: Active Problems:   Atrial fibrillation (HCC)   GIB (gastrointestinal bleeding)  1. Afib - s/p St. Jude PPM. She was on Coumadin for anticoagulation which was held as she had heme + stool.  Per GI, ok to restart Coumadin.  INR is 2.05.  Will restart her home dose Coumadin  daily.   2. Dyspnea - Worsening per patient.  Echo pending.   3. Loose Stool - She has had 3 loose stools, on Cdiff precautions.  Stool pending.   Signed, Little Ishikawa , NP 10:22 AM 06/10/2015 Pager [phone]   Patient seen and examined. Agree with assessment and plan. V paced rhythm. Minimal erosive esophagitis and a small hiatal hernia noted on EGD.  Will resume oral anticoagulation today per GI recommendation. Aim for INR 2-2.5 range.   Stool being collected;  Probable dc tomorrow.     Lennette Bihari, MD, [facility] 06/10/2015 11:12 AM

## 2015-06-10 NOTE — Discharge Instructions (Signed)

## 2015-06-10 NOTE — Care Management Obs Status (Signed)
MEDICARE OBSERVATION STATUS NOTIFICATION   Patient Details  Name: Denise [name] MRN: [id number] Date of Birth: [birth year]   Medicare Observation Status Notification Given:  Yes (GI Bleed)    Gala Lewandowsky, RN 06/10/2015, 4:17 PM

## 2015-06-10 NOTE — Care Management Important Message (Signed)
Important Message  Patient Details  Name: Denise [name] MRN: [id number] Date of Birth: [birth year]   Medicare Important Message Given:  Yes    Bernadette Hoit 06/10/2015, 12:55 PM

## 2015-06-10 NOTE — Progress Notes (Signed)
Echocardiogram 2D Echocardiogram has been performed.  Denise [name] 06/10/2015, 10:42 AM

## 2015-06-11 ENCOUNTER — Other Ambulatory Visit: Payer: Self-pay | Admitting: Physician Assistant

## 2015-06-11 DIAGNOSIS — Z7901 Long term (current) use of anticoagulants: Secondary | ICD-10-CM | POA: Diagnosis not present

## 2015-06-11 DIAGNOSIS — I482 Chronic atrial fibrillation: Secondary | ICD-10-CM | POA: Diagnosis not present

## 2015-06-11 DIAGNOSIS — K209 Esophagitis, unspecified: Secondary | ICD-10-CM | POA: Diagnosis not present

## 2015-06-11 DIAGNOSIS — K921 Melena: Secondary | ICD-10-CM | POA: Diagnosis not present

## 2015-06-11 DIAGNOSIS — Z5181 Encounter for therapeutic drug level monitoring: Secondary | ICD-10-CM | POA: Diagnosis not present

## 2015-06-11 DIAGNOSIS — D649 Anemia, unspecified: Secondary | ICD-10-CM

## 2015-06-11 LAB — PROTIME-INR
INR: 1.69 — ABNORMAL HIGH (ref 0.00–1.49)
PROTHROMBIN TIME: 19.9 s — AB (ref 11.6–15.2)

## 2015-06-11 MED ORDER — METOPROLOL SUCCINATE ER 50 MG PO TB24: 50.0000 mg | ORAL_TABLET | Freq: Every day | ORAL | Status: DC

## 2015-06-11 MED ORDER — AMLODIPINE BESYLATE 5 MG PO TABS: 5.0000 mg | ORAL_TABLET | Freq: Every morning | ORAL | Status: DC

## 2015-06-11 MED ORDER — PANTOPRAZOLE SODIUM 40 MG PO TBEC
40.0000 mg | DELAYED_RELEASE_TABLET | Freq: Every day | ORAL | Status: DC
  Administered 2015-06-11: 40 mg via ORAL
  Filled 2015-06-11: qty 1

## 2015-06-11 MED ORDER — PANTOPRAZOLE SODIUM 40 MG PO TBEC: 40.0000 mg | DELAYED_RELEASE_TABLET | Freq: Every day | ORAL | Status: DC

## 2015-06-11 MED ORDER — WARFARIN SODIUM 5 MG PO TABS: 5.0000 mg | ORAL_TABLET | Freq: Once | ORAL | Status: DC

## 2015-06-11 NOTE — Progress Notes (Addendum)
ANTICOAGULATION CONSULT NOTE - Initial Consult  Pharmacy Consult for warfarin Indication: atrial fibrillation  Allergies  Allergen Reactions  . Iohexol Rash     Desc: VOMITING-VERIFIED ON 05-24-04 PRE-PROCEDURE/ ARS   . Shrimp [Shellfish Allergy] Nausea And Vomiting and Rash    Patient Measurements: Height: 5\' 1"  (154.9 cm) Weight: 164 lb 3.9 oz (74.5 kg) IBW/kg (Calculated) : 47.8   Vital Signs:    Labs:  Recent Labs  06/09/15 0511 06/10/15 0338 06/11/15 0319  HGB 12.1 11.7*  --   HCT 37.2 37.9  --   PLT 134* 138*  --   LABPROT 27.2* 23.0* 19.9*  INR 2.56* 2.05* 1.69*    Estimated Creatinine Clearance: 29.6 mL/min (by C-G formula based on Cr of 1.19).   Medical History: Past Medical History  Diagnosis Date  . Diabetes mellitus without complication (HCC)   . Thyroid disease   . Permanent atrial fibrillation (HCC)   . Hypertension   . CHF (congestive heart failure) (HCC)   . LVH (left ventricular hypertrophy)   . Mitral regurgitation     mod to severe  . Tricuspid regurgitation     severe  . Pulmonary hypertension (HCC)     severe    Medications:  Prescriptions prior to admission  Medication Sig Dispense Refill Last Dose  . atorvastatin (LIPITOR) 10 MG tablet Take 10 mg by mouth every evening.   06/07/2015 at Unknown time  . hydrochlorothiazide (HYDRODIURIL) 25 MG tablet Take 12.5 mg by mouth every morning.   06/07/2015 at Unknown time  . isosorbide mononitrate (IMDUR) 30 MG 24 hr tablet Take 1 tablet (30 mg total) by mouth every morning. 90 tablet 1 06/07/2015 at Unknown time  . methimazole (TAPAZOLE) 10 MG tablet Take 10 mg by mouth every morning.   06/07/2015 at Unknown time  . metoprolol succinate (TOPROL-XL) 50 MG 24 hr tablet Take 1 tablet (50 mg total) by mouth daily. Take with or immediately following a meal. (Patient taking differently: Take 25 mg by mouth daily. Take with or immediately following a meal.) 90 tablet 3 06/07/2015 at 8a  . potassium  chloride SA (K-DUR,KLOR-CON) 20 MEQ tablet Take 20 mEq by mouth daily.    06/07/2015 at Unknown time  . ramipril (ALTACE) 5 MG capsule Take 1 capsule (5 mg total) by mouth daily. 30 capsule 6 06/07/2015 at Unknown time  . warfarin (COUMADIN) 5 MG tablet Take 5 mg by mouth daily. 5 mg every day   06/06/2015 at 4p    Assessment: 82 yo female on warfarin PTA for AFib. Admitted with heme+ stool, GI evaluated and gave the ok to resume anticoagulation. EGD showed erosive esophagitis and a small hernia, no evidence of active bleeding. INR 1.69, warfarin was held for 3 days while GI problems were being worked up.  PTA warfarin: 5 mg po daily   Goal of Therapy:  INR 2-3 Monitor platelets by anticoagulation protocol: Yes   Plan:  -Warfarin 5 mg po x1 -Daily INR  Baldemar Friday 06/11/2015,8:43 AM

## 2015-06-11 NOTE — Discharge Summary (Signed)
Discharge Summary    Patient ID: Denise [name],  MRN: [id number], DOB/AGE: Dec 13, 1923 82 y.o.  Admit date: 06/07/2015 Discharge date: 06/11/2015  Primary Care Provider: D[facility] Primary Cardiologist: Dr. Tresa Endo  Discharge Diagnoses    Principal Problem:   GIB (gastrointestinal bleeding) Active Problems:   Atrial fibrillation (HCC)   DM (diabetes mellitus) (HCC)   HTN (hypertension)   Pacemaker   Nonischemic cardiomyopathy (HCC)   Long term current use of anticoagulant therapy   Hyperlipidemia   Heme positive stool   Allergies Allergies  Allergen Reactions  . Iohexol Rash     Desc: VOMITING-VERIFIED ON 05-24-04 PRE-PROCEDURE/ ARS   . Shrimp [Shellfish Allergy] Nausea And Vomiting and Rash    Diagnostic Studies/Procedures    EGD 06/09/2015  Impression: - Normal oropharynx.  - LA Grade A reflux esophagitis.  - Small hiatal hernia.  - Normal examined duodenum.  - Melena likely secondary to mucosal bleeding from   anticoagulation.  - No specimens collected.  Recommendation: - Use Protonix (pantoprazole) 40 mg PO daily.  - Clear liquid diet.  - Advance diet as tolerated.  - Post procedure medication orders were given. _____________   History of Present Illness     Denise [name] is a 82 y.o. female with a history of afib on coumadin, s/p AV node ablation and St Jude PPM, hypertensive CM, severe LA dilatation, EF 45-50% w/ mod-sev MR, mod PAH and sclerosis AoV.   Denise [name] presents for evaluation of weakness and DOE.   She has noticed gradually increasing DOE for the last few weeks. She does not weigh regularly but has not noticed any orthopnea or PND. She has not had LE edema. According to her  weight today, she is within 3 lbs of her previous dry weight of 163 lbs. She is compliant with medications and denies dietary indiscretions. No recent new medications, vitamins, dietary changes.  She reports that her last 2 stools were black and tarry, 3 days ago and yesterday. This is new for her. She has also been weak.   Her INR was checked and is 3.4. Stool was melena in appearance and guaiac positive.  Hospital Course     Consultants: Dr. Estrella Deeds with GI  She was admitted to cardiology service, initial labs are finding include creatinine of 1.19, hemoglobin of 13.9, hemoglobin A1c 6.5, TSH 5.8. She had positive fecal occult blood. Gastroenterology service was consulted, patient underwent EGD on 06/09/2015 which revealed normal oral pharynx, grade A reflux esophagitis, small hiatal hernia, it was felt the melena likely is secondary to mucosal bleeding from anticoagulation. It was recommended to treat her with 40 mg IV Protonix and change to 40 mg PO Protonix prior to discharge. It was felt that given her advanced age and comorbidities, will defer colonoscopy as this time. Her Coumadin was resumed on 06/10/2015. She was seen on the following morning on 3/28, at which time she denies any significant chest discomfort or shortness of breath. Physical exam was essentially normal. She denies any further bleeding episode. She is deemed stable for discharge from cardiology perspective. She will need to recheck her PT INR this Friday on 06/14/2015 and have the results faxed to Dr. Landry Dyke office. I will arrange a follow-up CBC in one week and 2-4 weeks follow-up in the cardiology office.   _____________  Discharge Vitals Blood pressure 132/99, pulse 70, temperature 97.8 F (36.6 C), temperature source Oral, resp. rate 24, height 5\' 1"  (1.549 m),  weight 164 lb 3.9 oz (74.5 kg), SpO2 97 %.  Filed Weights   06/08/15 0400 06/09/15 0400 06/10/15 0358  Weight: 164 lb 4.8 oz (74.526 kg) 163 lb 2.3 oz (74 kg) 164  lb 3.9 oz (74.5 kg)    Labs & Radiologic Studies     CBC  Recent Labs  06/09/15 0511 06/10/15 0338  WBC 4.8 4.7  HGB 12.1 11.7*  HCT 37.2 37.9  MCV 87.3 87.3  PLT 134* 138*    Disposition   Pt is being discharged home today in good condition.  Follow-up Plans & Appointments    Follow-up Information    Follow up with Thurmon Fair, MD On 07/09/2015.   Specialty:  Cardiology   Why:  11:45PM   Contact information:   [address] [place] [address] [phone]       Follow up with Lennette Bihari, MD On 06/11/2015.   Specialty:  Cardiology   Why:  office will contact you in July to set up appointment with Dr. Janyth Pupa information:   [address] [place] [address] [phone]       Follow up with [facility] On 06/18/2015.   Specialty:  Cardiology   Why:  obtain CBC in 1 week to monitor for recurrent bleeding.    Contact information:   [address] [place] [address] [phone]      Please follow up.   Why:  please check your PT/INR on Friday 06/14/2015 and have result sent to Dr. Landry Dyke office     Discharge Instructions    Diet - low sodium heart healthy    Complete by:  As directed      Increase activity slowly    Complete by:  As directed            Discharge Medications   Current Discharge Medication List    START taking these medications   Details  pantoprazole (PROTONIX) 40 MG tablet Take 1 tablet (40 mg total) by mouth daily. Qty: 90 tablet, Refills: 3      CONTINUE these medications which have CHANGED   Details  amLODipine (NORVASC) 5 MG tablet Take 1 tablet (5 mg total) by mouth every morning. Qty: 90 tablet, Refills: 2    metoprolol succinate (TOPROL-XL) 50 MG 24 hr tablet Take 1 tablet (50 mg total) by mouth daily. Take with or immediately following a meal. Qty: 90 tablet, Refills: 3      CONTINUE these medications which have NOT CHANGED    Details  atorvastatin (LIPITOR) 10 MG tablet Take 10 mg by mouth every evening.    hydrochlorothiazide (HYDRODIURIL) 25 MG tablet Take 12.5 mg by mouth every morning.    isosorbide mononitrate (IMDUR) 30 MG 24 hr tablet Take 1 tablet (30 mg total) by mouth every morning. Qty: 90 tablet, Refills: 1    methimazole (TAPAZOLE) 10 MG tablet Take 10 mg by mouth every morning.    potassium chloride SA (K-DUR,KLOR-CON) 20 MEQ tablet Take 20 mEq by mouth daily.     ramipril (ALTACE) 5 MG capsule Take 1 capsule (5 mg total) by mouth daily. Qty: 30 capsule, Refills: 6    warfarin (COUMADIN) 5 MG tablet Take 5 mg by mouth daily. 5 mg every day           Outstanding Labs/Studies   PT/INR this Friday and repeat CBC in 1 week  Duration of Discharge Encounter   Greater than 30  minutes including physician time.  Signed, Azalee Course PA-C 06/11/2015, 11:26 AM

## 2015-06-11 NOTE — Progress Notes (Signed)
Patient Name: Denise [name] Date of Encounter: 06/11/2015     Active Problems:   Atrial fibrillation (HCC)   GIB (gastrointestinal bleeding)   Gastrointestinal bleeding   Primary Cardiologist: Dr. Tresa Endo  Patient Profile:Denise [name] is a 82 y.o. female with a history of afib on coumadin, s/p AV node ablation and St Jude PPM, hypertensive CM, severe LA dilatation, EF 45-50% w/ mod-sev MR, mod PAH and sclerosis AoV. She had worsening DOE over the past few weeks. Had guaiac positive stools. Had normal EGD on 06/09/15, bleeding most likely due to mucosal bleeding.   SUBJECTIVE  Denies any pain or SOB. Eager to leave the hospital  CURRENT MEDS . amLODipine  5 mg Oral q morning - 10a  . atorvastatin  10 mg Oral QPM  . hydrochlorothiazide  12.5 mg Oral q morning - 10a  . isosorbide mononitrate  30 mg Oral q morning - 10a  . methimazole  10 mg Oral q morning - 10a  . metoprolol succinate  50 mg Oral Daily  . omega-3 acid ethyl esters  1 g Oral Daily  . pantoprazole (PROTONIX) IV  40 mg Intravenous Q24H  . potassium chloride SA  20 mEq Oral Daily  . ramipril  5 mg Oral Daily  . sodium chloride flush  3 mL Intravenous Q12H  . warfarin  5 mg Oral ONCE-1800  . Warfarin - Pharmacist Dosing Inpatient   Does not apply q1800    OBJECTIVE  Filed Vitals:   06/10/15 0358 06/10/15 0756 06/10/15 1100 06/10/15 1920  BP:   129/64 132/99  Pulse: 70 70  70  Temp: 97.8 F (36.6 C)   97.3 F (36.3 C)  TempSrc: Oral   Oral  Resp: Height:      Weight: 164 lb 3.9 oz (74.5 kg)     SpO2: 96% 98%  97%    Intake/Output Summary (Last 24 hours) at 06/11/15 0937 Last data filed at 06/11/15 0900  Gross per 24 hour  Intake    240 ml  Output    600 ml  Net   -360 ml   Filed Weights   06/08/15 0400 06/09/15 0400 06/10/15 0358  Weight: 164 lb 4.8 oz (74.526 kg) 163 lb 2.3 oz (74 kg) 164 lb 3.9 oz (74.5 kg)    PHYSICAL EXAM  General: Pleasant, NAD. Neuro: Alert and  oriented X 3. Moves all extremities spontaneously. Psych: Normal affect. HEENT:  Normal  Neck: Supple without bruits or JVD. Lungs:  Resp regular and unlabored, CTA. Heart: RRR no s3, s4, or murmurs. Abdomen: Soft, non-tender, non-distended, BS + x 4.  Extremities: No clubbing, cyanosis or edema. DP/PT/Radials 2+ and equal bilaterally.  Accessory Clinical Findings  CBC  Recent Labs  06/09/15 0511 06/10/15 0338  WBC 4.8 4.7  HGB 12.1 11.7*  HCT 37.2 37.9  MCV 87.3 87.3  PLT 134* 138*    TELE Paced rhythm    ECG  No new EKG  Echocardiogram 06/10/2015  LV EF: 45% - 50%  ------------------------------------------------------------------- Indications: Dyspnea 786.09.  ------------------------------------------------------------------- History: PMH: Atrial fibrillation. Congestive heart failure. Primary pulmonary hypertension. Risk factors: Hypertension. Diabetes mellitus.  ------------------------------------------------------------------- Study Conclusions  - Left ventricle: Septal and ? of inferior wall hypokinesis The  cavity size was normal. There was mild concentric hypertrophy.  Systolic function was mildly reduced. The estimated ejection  fraction was in the range of 45% to 50%. - Aortic valve: There was trivial regurgitation. - Mitral valve:  Calcified annulus. Mildly thickened leaflets .  There was mild regurgitation. - Right atrium: The atrium was mildly dilated. - Atrial septum: No defect or patent foramen ovale was identified. - Tricuspid valve: There was moderate-severe regurgitation. - Pulmonary arteries: PA peak pressure: 56 mm Hg (S).    Radiology/Studies  No results found.  ASSESSMENT AND PLAN  1. Afib - s/p St. Jude PPM. She was on Coumadin for anticoagulation which was held as she had heme + stool. Per GI, ok to restart Coumadin. INR is 2.05. Coumadin restarted yesterday, will need to recheck coumadin this Friday.  Discharge today, transition to 40mg  PO protonix prior to discharge.    2. Dyspnea - Worsening per patient. Echo EF 45-50%, septal and questionable inferior wall hypokinesis, mild LVH, moderate to severe TR, mild MR, PA peak pressure . EF unchanged compare to previous echo in 2012  - maybe related to anemia  3. Loose Stool - She has had 3 loose stools, on Cdiff precautions. Stool pending, but per pt, this decrease of loose stool is normal for her  4. GI bleed  - EGD 06/09/2015 esophagitis, felt melena likely secondary to mucosal bleeding from anticoagulation  - recommended continue protonix 40mg  daily. Per GI, would not recommend colonoscopy due to advanced age and comorbidities  Leonides Schanz Amedeo Plenty Pager: [phone]  Patient seen and examined. Agree with assessment and plan. No chest pain. On protonix with esophagitis. Coumadin restarted yesterday.  INR today 1.69.  Will dc today; f/u INR as outpatient this Friday.   Lennette Bihari, MD, [facility] 06/11/2015 10:52 AM

## 2015-06-12 ENCOUNTER — Telehealth: Payer: Self-pay | Admitting: Cardiovascular Disease

## 2015-06-12 NOTE — Telephone Encounter (Signed)
Patient contacted regarding discharge from [facility] on 06/11/2015.  Patient understands to follow up with provider Dr Royann Shivers on April 25th at 10:45 at [facility]. Patient understands discharge instructions? Yes Patient understands medications and regiment? Yes Patient understands to bring all medications to this visit? Yes

## 2015-06-12 NOTE — Telephone Encounter (Signed)
TCM phone call . Appt is on 07/09/15 at 10:45am w/ Dr. Royann Shivers .[name] Thanks

## 2015-06-14 ENCOUNTER — Encounter (HOSPITAL_COMMUNITY): Payer: Self-pay | Admitting: Emergency Medicine

## 2015-06-14 ENCOUNTER — Emergency Department (INDEPENDENT_AMBULATORY_CARE_PROVIDER_SITE_OTHER)
Admission: EM | Admit: 2015-06-14 | Discharge: 2015-06-14 | Disposition: A | Discharge status: Home / Self Care | Payer: Medicare Other | Source: Home / Self Care | Attending: Emergency Medicine | Admitting: Emergency Medicine

## 2015-06-14 ENCOUNTER — Ambulatory Visit (INDEPENDENT_AMBULATORY_CARE_PROVIDER_SITE_OTHER): Payer: Medicare Other | Admitting: Pharmacist Clinician (PhC)/ Clinical Pharmacy Specialist

## 2015-06-14 DIAGNOSIS — I482 Chronic atrial fibrillation: Secondary | ICD-10-CM

## 2015-06-14 DIAGNOSIS — I4891 Unspecified atrial fibrillation: Secondary | ICD-10-CM | POA: Diagnosis not present

## 2015-06-14 DIAGNOSIS — M10071 Idiopathic gout, right ankle and foot: Secondary | ICD-10-CM

## 2015-06-14 DIAGNOSIS — Z7901 Long term (current) use of anticoagulants: Secondary | ICD-10-CM

## 2015-06-14 DIAGNOSIS — I4821 Permanent atrial fibrillation: Secondary | ICD-10-CM

## 2015-06-14 LAB — POCT INR: INR: 2.2

## 2015-06-14 LAB — POCT I-STAT, CHEM 8
BUN: 31 mg/dL — ABNORMAL HIGH (ref 6–20)
CALCIUM ION: 1.13 mmol/L (ref 1.13–1.30)
Chloride: 99 mmol/L — ABNORMAL LOW (ref 101–111)
Creatinine, Ser: 1.2 mg/dL — ABNORMAL HIGH (ref 0.44–1.00)
GLUCOSE: 151 mg/dL — AB (ref 65–99)
HEMATOCRIT: 44 % (ref 36.0–46.0)
HEMOGLOBIN: 15 g/dL (ref 12.0–15.0)
POTASSIUM: 3.5 mmol/L (ref 3.5–5.1)
SODIUM: 141 mmol/L (ref 135–145)
TCO2: 28 mmol/L (ref 0–100)

## 2015-06-14 MED ORDER — PREDNISONE 50 MG PO TABS: ORAL_TABLET | ORAL | Status: DC

## 2015-06-14 MED ORDER — HYDROCODONE-ACETAMINOPHEN 5-325 MG PO TABS: 1.0000 | ORAL_TABLET | Freq: Four times a day (QID) | ORAL | Status: DC | PRN

## 2015-06-14 NOTE — ED Provider Notes (Signed)
CSN: [id number]     Arrival date & time 06/14/15  1308 History   First MD Initiated Contact with Patient 06/14/15 1407     Chief Complaint  Patient presents with  . Toe Pain   (Consider location/radiation/quality/duration/timing/severity/associated sxs/prior Treatment) HPI  She is an 82 year old woman here for evaluation of right great toe pain. She is accompanied by her son. She was recently hospitalized for a GI bleed. She was discharged on Tuesday. Her son states the pain and swelling in her toe started prior to discharge. It has gotten worse since being at home. The patient states she gets up and walks every hour or so, but her son states she has been weaker and spending more time in bed since being discharged. She does have a history of gout, but has only had one or 2 flares in the past. No known injury or trauma. No fevers or chills.  Past Medical History  Diagnosis Date  . Diabetes mellitus without complication (HCC)   . Thyroid disease   . Permanent atrial fibrillation (HCC)   . Hypertension   . CHF (congestive heart failure) (HCC)   . LVH (left ventricular hypertrophy)   . Mitral regurgitation     mod to severe  . Tricuspid regurgitation     severe  . Pulmonary hypertension (HCC)     severe   Past Surgical History  Procedure Laterality Date  . Pacemaker insertion  05/26/2004    St.Jude  . Abdominal hysterectomy    . Tubal ligation    . Cholecystectomy    . Thyroidectomy    . Knee replacement      R  . Compression hip screw Right 06/06/2012    Procedure: Open Reduction Internal Fixation Right HIp;  Surgeon: Dannielle Huh, MD;  Location: [facility] OR;  Service: Orthopedics;  Laterality: Right;  . Cardioversion  5 /18/ 2000    unsuccessful  . US echocardiography  02/25/2011    mod to severe MR,LA severe dilated,mild annular ca+,mod. TR,mod PI,mod ca+ of the aortic valve leaflet  . Nm myocar perf wall motion  02/28/2009    No signficant ischemia  . Esophagogastroduodenoscopy N/A  06/09/2015    Procedure: ESOPHAGOGASTRODUODENOSCOPY (EGD);  Surgeon: Charlott Rakes, MD;  Location: [facility] ENDOSCOPY;  Service: Endoscopy;  Laterality: N/A;   Family History  Problem Relation Age of Onset  . Asthma Mother   . Heart attack Father   . Stroke Sister   . Heart attack Brother   . Cancer - Colon Brother   . Gallbladder disease Maternal Grandmother   . Stroke Maternal Grandfather   . Heart disease Child    Social History  Substance Use Topics  . Smoking status: Never Smoker   . Smokeless tobacco: None  . Alcohol Use: No   OB History    No data available     Review of Systems As in history of present illness Allergies  Iohexol and Shrimp  Home Medications   Prior to Admission medications   Medication Sig Start Date End Date Taking? Authorizing Provider  amLODipine (NORVASC) 5 MG tablet Take 1 tablet (5 mg total) by mouth every morning. 06/11/15  Yes Azalee Course, PA  atorvastatin (LIPITOR) 10 MG tablet Take 10 mg by mouth every evening.   Yes Historical Provider, MD  hydrochlorothiazide (HYDRODIURIL) 25 MG tablet Take 12.5 mg by mouth every morning.   Yes Historical Provider, MD  isosorbide mononitrate (IMDUR) 30 MG 24 hr tablet Take 1 tablet (30 mg total) by  mouth every morning. 03/25/15  Yes Mihai Croitoru, MD  metoprolol succinate (TOPROL-XL) 50 MG 24 hr tablet Take 1 tablet (50 mg total) by mouth daily. Take with or immediately following a meal. 06/11/15  Yes Azalee Course, PA  pantoprazole (PROTONIX) 40 MG tablet Take 1 tablet (40 mg total) by mouth daily. 06/11/15  Yes Azalee Course, PA  potassium chloride SA (K-DUR,KLOR-CON) 20 MEQ tablet Take 20 mEq by mouth daily.  03/22/15  Yes Historical Provider, MD  ramipril (ALTACE) 5 MG capsule Take 1 capsule (5 mg total) by mouth daily. 05/06/15  Yes Lennette Bihari, MD  warfarin (COUMADIN) 5 MG tablet Take 5 mg by mouth daily. 5 mg every day   Yes Historical Provider, MD  HYDROcodone-acetaminophen (NORCO) 5-325 MG tablet Take 1 tablet by  mouth every 6 (six) hours as needed for moderate pain. 06/14/15   Charm Rings, MD  methimazole (TAPAZOLE) 10 MG tablet Take 10 mg by mouth every morning.    Historical Provider, MD  predniSONE (DELTASONE) 50 MG tablet Take 1 pill daily for 5 days. 06/14/15   Charm Rings, MD   Meds Ordered and Administered this Visit  Medications - No data to display  BP 139/62 mmHg  Pulse 66  Temp(Src) 100 F (37.8 C) (Oral)  Resp 16  SpO2 94% No data found.   Physical Exam  Constitutional: She is oriented to person, place, and time. She appears well-developed and well-nourished. No distress.  Cardiovascular: Normal rate.   Pulmonary/Chest: Effort normal.  Musculoskeletal:  Right foot: There is erythema and swelling at the first MTP joint. She is tender over this area. No pain with passive movement of the toe. Palpable DP pulse.  Neurological: She is alert and oriented to person, place, and time.    ED Course  Procedures (including critical care time)  Labs Review Labs Reviewed  POCT I-STAT, CHEM 8 - Abnormal; Notable for the following:    Chloride 99 (*)    BUN 31 (*)    Creatinine, Ser 1.20 (*)    Glucose, Bld 151 (*)    All other components within normal limits    Imaging Review No results found.    MDM   1. Acute idiopathic gout of right foot    History and exam consistent with gout. We'll treat with 5 days of prednisone. Prescription given for hydrocodone to use as needed for pain. Discussed that this medicine may make her drowsy or off balance. I did recheck a hemoglobin given the reported weakness. Her blood work is stable from discharge. Follow-up if not improving in 2 days.    Charm Rings, MD 06/14/15 [phone]

## 2015-06-14 NOTE — ED Notes (Signed)
C/o right foot pain/toe onset x3 days associated redness and swelling... Reports hx of gout Denies inj/trauma... Brought back on wheel chair ... Pain is 10/10 Son is at bedside D/c from hosp for GI bleed A&O x4... No acute distress.

## 2015-06-14 NOTE — Discharge Instructions (Signed)
You have gout in your foot. This is likely from the different diet you were eating in the hospital. Take prednisone daily for 5 days. You can take Tylenol as needed for pain. Use the hydrocodone every 4-6 hours as needed for pain. This medicine may make you sleepy or off balance. This should improve in the next 2 days. Follow-up as needed.

## 2015-06-24 LAB — PROTIME-INR: INR: 4.3 — AB (ref 0.9–1.1)

## 2015-06-25 ENCOUNTER — Ambulatory Visit (INDEPENDENT_AMBULATORY_CARE_PROVIDER_SITE_OTHER): Payer: Medicare Other | Admitting: Pharmacist Clinician (PhC)/ Clinical Pharmacy Specialist

## 2015-06-25 DIAGNOSIS — I482 Chronic atrial fibrillation: Secondary | ICD-10-CM

## 2015-06-25 DIAGNOSIS — I4821 Permanent atrial fibrillation: Secondary | ICD-10-CM

## 2015-06-25 DIAGNOSIS — Z7901 Long term (current) use of anticoagulants: Secondary | ICD-10-CM

## 2015-07-09 ENCOUNTER — Ambulatory Visit (INDEPENDENT_AMBULATORY_CARE_PROVIDER_SITE_OTHER): Payer: Medicare Other | Admitting: Cardiovascular Disease

## 2015-07-09 ENCOUNTER — Encounter: Payer: Self-pay | Admitting: Cardiovascular Disease

## 2015-07-09 ENCOUNTER — Ambulatory Visit (INDEPENDENT_AMBULATORY_CARE_PROVIDER_SITE_OTHER): Payer: Medicare Other | Admitting: Pharmacist

## 2015-07-09 VITALS — BP 130/72 | HR 69 | Ht 61.0 in | Wt 158.4 lb

## 2015-07-09 DIAGNOSIS — Z950 Presence of cardiac pacemaker: Secondary | ICD-10-CM

## 2015-07-09 DIAGNOSIS — I482 Chronic atrial fibrillation, unspecified: Secondary | ICD-10-CM

## 2015-07-09 DIAGNOSIS — I4891 Unspecified atrial fibrillation: Secondary | ICD-10-CM | POA: Diagnosis not present

## 2015-07-09 DIAGNOSIS — I429 Cardiomyopathy, unspecified: Secondary | ICD-10-CM | POA: Diagnosis not present

## 2015-07-09 DIAGNOSIS — I5042 Chronic combined systolic (congestive) and diastolic (congestive) heart failure: Secondary | ICD-10-CM

## 2015-07-09 DIAGNOSIS — I10 Essential (primary) hypertension: Secondary | ICD-10-CM

## 2015-07-09 DIAGNOSIS — I428 Other cardiomyopathies: Secondary | ICD-10-CM

## 2015-07-09 DIAGNOSIS — I4821 Permanent atrial fibrillation: Secondary | ICD-10-CM

## 2015-07-09 DIAGNOSIS — Z7901 Long term (current) use of anticoagulants: Secondary | ICD-10-CM

## 2015-07-09 DIAGNOSIS — I442 Atrioventricular block, complete: Secondary | ICD-10-CM | POA: Diagnosis not present

## 2015-07-09 LAB — CUP PACEART INCLINIC DEVICE CHECK
Battery Voltage: 2.74 V
Date Time Interrogation Session: 20170425131633
Implantable Lead Implant Date: 20060313
Lead Channel Impedance Value: 447 Ohm
Lead Channel Pacing Threshold Amplitude: 0.5 V
Lead Channel Setting Sensing Sensitivity: 2 mV
MDC IDC LEAD IMPLANT DT: 20060313
MDC IDC LEAD LOCATION: 753859
MDC IDC LEAD LOCATION: 753860
MDC IDC MSMT BATTERY REMAINING LONGEVITY: 33 mo
MDC IDC MSMT LEADCHNL RV PACING THRESHOLD PULSEWIDTH: 0.5 ms
MDC IDC PG SERIAL: 1457119
MDC IDC SET LEADCHNL RV PACING PULSEWIDTH: 0.5 ms
MDC IDC STAT BRADY RV PERCENT PACED: 97 %
Pulse Gen Model: 5386

## 2015-07-09 LAB — POCT INR: INR: 2.3

## 2015-07-09 NOTE — Patient Instructions (Addendum)
Your physician recommends that you continue on your current medications as directed. Please refer to the Current Medication list given to you today.    Your physician wants you to follow-up in 3 months with Dr Tresa Endo and in 6 months with Dr Royann Shivers. You will receive a reminder letter in the mail two months in advance. If you don't receive a letter, please call our office to schedule the follow-up appointment.

## 2015-07-09 NOTE — Progress Notes (Signed)
Patient Denise [name]: Denise [name], female   DOB: 08-29-1923, 82 y.o.   MRN: [id number]    Cardiology Office Note    Date:  07/11/2015   Denise [name]:  Denise [name], DOB 10/11/1923, MRN [id number]  PCP:  Daryl Eastern, MD  Cardiologist:  Nicki Guadalajara, MD;  Thurmon Fair, MD   Chief Complaint  Patient presents with  . Follow-up    patient reports shortness of breath with exertion - patient doesn't exert much.    History of Present Illness:  Denise [name] is a 82 y.o. female with complete heart block after AV node ablation for difficult to control atrial fibrillation who presents for pacemaker follow-up. She has a dual-chamber St. Jude identity device that was implanted in 2006 but the device is programmed VVIR since she is now in permanent atrial fibrillation. Her device is not amenable for remote monitoring.  Interrogation of her pacemaker shows that she still has 2.75-3.5 years of estimated generator longevity. She has virtually 100% ventricular pacing and has not had any high ventricular rates. Lead parameters remain excellent. On previous testing she has an occasional underlying escape rhythm at around 39-40 bpm, but this has not been consistently present. She continues anticoagulation with warfarin and has not had any embolic events.   She had an episode of acute arthritis presumed to be gout. She was given a short course of prednisone. Surprisingly, following this she had excessive anticoagulation with an INR of 7 and presented with melenic consistent with upper gastrointestinal bleeding. She did not receive any antibiotics around the time. Upper endoscopy did not show any major abnormalities and was felt that her melena was secondary to diffuse mucosal bleeding secondary to anticoagulation.  She underwent an echocardiogram during that hospital visit and this showed an ejection fraction of 45-50 percent, mild mitral regurgitation, moderate to severe tricuspid regurgitation with an estimated  systolic PA pressure 56 mmHg. Her echo in 2012 had described moderate to severe mitral regurgitation  She denies angina, edema, dizziness, syncope or palpitations. She has chronic exertional dyspnea, unchanged from baseline.    Past Medical History  Diagnosis Date  . Diabetes mellitus without complication (HCC)   . Thyroid disease   . Permanent atrial fibrillation (HCC)   . Hypertension   . CHF (congestive heart failure) (HCC)   . LVH (left ventricular hypertrophy)   . Mitral regurgitation     mod to severe  . Tricuspid regurgitation     severe  . Pulmonary hypertension (HCC)     severe    Past Surgical History  Procedure Laterality Date  . Pacemaker insertion  05/26/2004    St.Jude  . Abdominal hysterectomy    . Tubal ligation    . Cholecystectomy    . Thyroidectomy    . Knee replacement      R  . Compression hip screw Right 06/06/2012    Procedure: Open Reduction Internal Fixation Right HIp;  Surgeon: Dannielle Huh, MD;  Location: [facility] OR;  Service: Orthopedics;  Laterality: Right;  . Cardioversion  5 /18/ 2000    unsuccessful  . US echocardiography  02/25/2011    mod to severe MR,LA severe dilated,mild annular ca+,mod. TR,mod PI,mod ca+ of the aortic valve leaflet  . Nm myocar perf wall motion  02/28/2009    No signficant ischemia  . Esophagogastroduodenoscopy N/A 06/09/2015    Procedure: ESOPHAGOGASTRODUODENOSCOPY (EGD);  Surgeon: Charlott Rakes, MD;  Location: [facility] ENDOSCOPY;  Service: Endoscopy;  Laterality: N/A;    Current Medications: Outpatient  Prescriptions Prior to Visit  Medication Sig Dispense Refill  . amLODipine (NORVASC) 5 MG tablet Take 1 tablet (5 mg total) by mouth every morning. 30 tablet 0  . atorvastatin (LIPITOR) 10 MG tablet Take 10 mg by mouth every evening.    . hydrochlorothiazide (HYDRODIURIL) 25 MG tablet Take 12.5 mg by mouth every morning.    [name] HYDROcodone-acetaminophen (NORCO) 5-325 MG tablet Take 1 tablet by mouth every 6 (six) hours as  needed for moderate pain. 15 tablet 0  . isosorbide mononitrate (IMDUR) 30 MG 24 hr tablet Take 1 tablet (30 mg total) by mouth every morning. 90 tablet 1  . methimazole (TAPAZOLE) 10 MG tablet Take 10 mg by mouth every morning.    . metoprolol succinate (TOPROL-XL) 50 MG 24 hr tablet Take 1 tablet (50 mg total) by mouth daily. Take with or immediately following a meal. 30 tablet 0  . pantoprazole (PROTONIX) 40 MG tablet Take 1 tablet (40 mg total) by mouth daily. 30 tablet 0  . potassium chloride SA (K-DUR,KLOR-CON) 20 MEQ tablet Take 20 mEq by mouth daily.     . predniSONE (DELTASONE) 50 MG tablet Take 1 pill daily for 5 days. 5 tablet 0  . ramipril (ALTACE) 5 MG capsule Take 1 capsule (5 mg total) by mouth daily. 30 capsule 6  . warfarin (COUMADIN) 5 MG tablet Take 5 mg by mouth daily. 5 mg every day     No facility-administered medications prior to visit.     Allergies:   Iohexol and Shrimp   Social History   Social History  . Marital Status: Widowed    Spouse Name: N/A  . Number of Children: N/A  . Years of Education: N/A   Social History Main Topics  . Smoking status: Never Smoker   . Smokeless tobacco: None  . Alcohol Use: No  . Drug Use: No  . Sexual Activity: No   Other Topics Concern  . None   Social History Narrative     Family History:  The patient's family history includes Asthma in her mother; Cancer - Colon in her brother; Gallbladder disease in her maternal grandmother; Heart attack in her brother and father; Heart disease in her child; Stroke in her maternal grandfather and sister.   ROS:   Please see the history of present illness.    ROS All other systems reviewed and are negative.   PHYSICAL EXAM:   VS:  BP 130/72 mmHg  Pulse 69  Ht 5\' 1"  (1.549 m)  Wt 71.85 kg (158 lb 6.4 oz)  BMI 29.94 kg/m2   GEN: Well nourished, well developed, in no acute distress HEENT: normal Neck: no JVD, carotid bruits, or masses Cardiac: Paradoxically split second  heart sound, RRR; 3/6 early peaking ejection murmur radiating to the carotids, 2/6 holosystolic murmur heard across the anterior precordium, no diastolic murmurs, rubs, or gallops,no edema  Respiratory:  clear to auscultation bilaterally, normal work of breathing GI: soft, nontender, nondistended, + BS MS: no deformity or atrophy Skin: warm and dry, no rash Neuro:  Alert and Oriented x 3, Strength and sensation are intact Psych: euthymic mood, full affect  Wt Readings from Last 3 Encounters:  07/09/15 71.85 kg (158 lb 6.4 oz)  06/10/15 74.5 kg (164 lb 3.9 oz)  06/07/15 75.479 kg (166 lb 6.4 oz)      Studies/Labs Reviewed:   EKG:  EKG is not ordered today.    Recent Labs: 06/07/2015: ALT 21; TSH 5.806* 06/09/2015: B Natriuretic  Peptide 167.3* 06/10/2015: Platelets 138* 06/14/2015: BUN 31*; Creatinine, Ser 1.20*; Hemoglobin 15.0; Potassium 3.5; Sodium 141   Lipid Panel No results found for: CHOL, TRIG, HDL, CHOLHDL, VLDL, LDLCALC, LDLDIRECT  Additional studies/ records that were reviewed today include:  It's from her last visit with Dr. Tresa Endo    ASSESSMENT:    1. Chronic atrial fibrillation (HCC)   2. CHB (complete heart block) (HCC)   3. Chronic combined systolic and diastolic CHF, NYHA class 2 (HCC)   4. Nonischemic cardiomyopathy (HCC)   5. Essential hypertension   6. Chronic anticoagulation - coumadin, CHADS2VASC=5   7. Pacemaker      PLAN:  In order of problems listed above:  1. Permanent atrial fibrillation now status post AV node ablation. CHADSVasc 5 (age 71, gender, HF, HTN). 2. CHB: Pacemaker dependent following AV node ablation 3. CHF: Mildly depressed left ventricular systolic function due to nonischemic cardiomyopathy on ACE inhibitor's and beta blockers that she does not require daily loop diuretics but does take a low dose of thiazide. Heart this is functional status and she is so sedentary. 4. CMP: At least in part her cardiomyopathy is probably due to  right ventricular pacing with a synchronous left ventricular activation, but she does not appear to need CRT 5. HTN: Excellent blood pressure control 6. Recent bleeding due to excessive anticoagulation. The patient and her family believe that the prednisone caused this abnormality, although that would not be a typical drug interaction. 7. Normal pacemaker function. Her device is not amenable to remote follow-up. Will continue clinic checks. She will alternate between visits with Dr. Tresa Endo and pacemaker checks with me. That way, her device will be checked every 3 months.  Unclear if her mitral insufficiency was overestimated in the pastor is currently underestimated. It is not possible to tell whether her systolic murmur is from TR or MR by exam. She does have significant pulmonary hypertension, which could be multifactorial.  Medication Adjustments/Labs and Tests Ordered: Current medicines are reviewed at length with the patient today.  Concerns regarding medicines are outlined above.  Medication changes, Labs and Tests ordered today are listed in the Patient Instructions below. Patient Instructions  Your physician recommends that you continue on your current medications as directed. Please refer to the Current Medication list given to you today.    Your physician wants you to follow-up in 3 months with Dr Tresa Endo and in 6 months with Dr Denise [name]. You will receive a reminder letter in the mail two months in advance. If you don't receive a letter, please call our office to schedule the follow-up appointment.     Denise Bimler, MD  07/11/2015 3:22 PM    [facility] [address] [place], [place], [place]  [address] Phone: [phone]; Fax: [phone]

## 2015-07-12 ENCOUNTER — Encounter: Payer: Self-pay | Admitting: Cardiovascular Disease

## 2015-07-30 LAB — PROTIME-INR: INR: 2.4 — AB (ref 0.9–1.1)

## 2015-07-31 ENCOUNTER — Ambulatory Visit (INDEPENDENT_AMBULATORY_CARE_PROVIDER_SITE_OTHER): Payer: Medicare Other | Admitting: Pharmacist Clinician (PhC)/ Clinical Pharmacy Specialist

## 2015-07-31 DIAGNOSIS — Z7901 Long term (current) use of anticoagulants: Secondary | ICD-10-CM

## 2015-07-31 DIAGNOSIS — I482 Chronic atrial fibrillation, unspecified: Secondary | ICD-10-CM

## 2015-09-03 ENCOUNTER — Other Ambulatory Visit: Payer: Self-pay | Admitting: Pharmacist

## 2015-09-03 ENCOUNTER — Other Ambulatory Visit: Payer: Self-pay | Admitting: *Deleted

## 2015-09-03 MED ORDER — WARFARIN SODIUM 5 MG PO TABS: 5.0000 mg | ORAL_TABLET | Freq: Every day | ORAL | Status: DC

## 2015-09-18 ENCOUNTER — Telehealth: Payer: Self-pay | Admitting: Pharmacist

## 2015-09-18 NOTE — Telephone Encounter (Signed)
Spoke to pt about getting INR checked as she is 2 weeks overdue. She stated that it is hard for her to get a ride to town but she will go this week to have INR drawn.

## 2015-10-01 ENCOUNTER — Other Ambulatory Visit: Payer: Self-pay

## 2015-10-01 MED ORDER — WARFARIN SODIUM 5 MG PO TABS: 5.0000 mg | ORAL_TABLET | Freq: Every day | ORAL | Status: DC

## 2015-10-04 ENCOUNTER — Ambulatory Visit (INDEPENDENT_AMBULATORY_CARE_PROVIDER_SITE_OTHER): Payer: Medicare Other | Admitting: Pharmacist

## 2015-10-04 DIAGNOSIS — I482 Chronic atrial fibrillation, unspecified: Secondary | ICD-10-CM

## 2015-10-04 DIAGNOSIS — Z7901 Long term (current) use of anticoagulants: Secondary | ICD-10-CM

## 2015-10-04 LAB — PROTIME-INR: INR: 2 — AB (ref 0.9–1.1)

## 2015-10-29 LAB — PROTIME-INR: INR: 2.1 — AB (ref <=1.1)

## 2015-10-31 ENCOUNTER — Ambulatory Visit (INDEPENDENT_AMBULATORY_CARE_PROVIDER_SITE_OTHER): Payer: Medicare Other | Admitting: Pharmacist

## 2015-10-31 DIAGNOSIS — Z7901 Long term (current) use of anticoagulants: Secondary | ICD-10-CM

## 2015-10-31 DIAGNOSIS — I482 Chronic atrial fibrillation, unspecified: Secondary | ICD-10-CM

## 2015-11-19 ENCOUNTER — Other Ambulatory Visit: Payer: Self-pay | Admitting: Endocrinology

## 2015-11-19 DIAGNOSIS — R221 Localized swelling, mass and lump, neck: Secondary | ICD-10-CM

## 2015-11-20 ENCOUNTER — Ambulatory Visit (INDEPENDENT_AMBULATORY_CARE_PROVIDER_SITE_OTHER): Payer: Medicare Other | Admitting: Pharmacist

## 2015-11-20 ENCOUNTER — Encounter: Payer: Self-pay | Admitting: Cardiovascular Disease

## 2015-11-20 ENCOUNTER — Ambulatory Visit
Admission: RE | Admit: 2015-11-20 | Discharge: 2015-11-20 | Disposition: A | Discharge status: Home / Self Care | Payer: Medicare Other | Source: Ambulatory Visit | Attending: Endocrinology | Admitting: Endocrinology

## 2015-11-20 ENCOUNTER — Ambulatory Visit (INDEPENDENT_AMBULATORY_CARE_PROVIDER_SITE_OTHER): Payer: Medicare Other | Admitting: Cardiovascular Disease

## 2015-11-20 VITALS — BP 114/64 | HR 72 | Ht 61.0 in | Wt 155.8 lb

## 2015-11-20 DIAGNOSIS — I442 Atrioventricular block, complete: Secondary | ICD-10-CM | POA: Diagnosis not present

## 2015-11-20 DIAGNOSIS — I4891 Unspecified atrial fibrillation: Secondary | ICD-10-CM

## 2015-11-20 DIAGNOSIS — Z7901 Long term (current) use of anticoagulants: Secondary | ICD-10-CM

## 2015-11-20 DIAGNOSIS — R221 Localized swelling, mass and lump, neck: Secondary | ICD-10-CM

## 2015-11-20 DIAGNOSIS — I482 Chronic atrial fibrillation, unspecified: Secondary | ICD-10-CM

## 2015-11-20 DIAGNOSIS — E049 Nontoxic goiter, unspecified: Secondary | ICD-10-CM

## 2015-11-20 DIAGNOSIS — I10 Essential (primary) hypertension: Secondary | ICD-10-CM

## 2015-11-20 LAB — COMPREHENSIVE METABOLIC PANEL
ALBUMIN: 4.3 g/dL (ref 3.6–5.1)
ALK PHOS: 108 U/L (ref 33–130)
ALT: 33 U/L — AB (ref 6–29)
AST: 38 U/L — AB (ref 10–35)
BILIRUBIN TOTAL: 1.3 mg/dL — AB (ref 0.2–1.2)
BUN: 22 mg/dL (ref 7–25)
CALCIUM: 9.4 mg/dL (ref 8.6–10.4)
CO2: 28 mmol/L (ref 20–31)
Chloride: 99 mmol/L (ref 98–110)
Creat: 1.04 mg/dL — ABNORMAL HIGH (ref 0.60–0.88)
GLUCOSE: 106 mg/dL — AB (ref 65–99)
Potassium: 3.5 mmol/L (ref 3.5–5.3)
Sodium: 138 mmol/L (ref 135–146)
Total Protein: 7.2 g/dL (ref 6.1–8.1)

## 2015-11-20 LAB — CBC
HCT: 42.5 % (ref 35.0–45.0)
Hemoglobin: 13.8 g/dL (ref 11.7–15.5)
MCH: 27.9 pg (ref 27.0–33.0)
MCHC: 32.5 g/dL (ref 32.0–36.0)
MCV: 85.9 fL (ref 80.0–100.0)
MPV: 11.1 fL (ref 7.5–12.5)
PLATELETS: 158 10*3/uL (ref 140–400)
RBC: 4.95 MIL/uL (ref 3.80–5.10)
RDW: 15.9 % — ABNORMAL HIGH (ref 11.0–15.0)
WBC: 7 10*3/uL (ref 3.8–10.8)

## 2015-11-20 LAB — POCT INR: INR: 2.2

## 2015-11-20 NOTE — Patient Instructions (Signed)
Your physician recommends that you return for lab work in: TODAY.  Your physician wants you to follow-up in: 6 months or sooner if needed. You will receive a reminder letter in the mail two months in advance. If you don't receive a letter, please call our office to schedule the follow-up appointment.  If you need a refill on your cardiac medications before your next appointment, please call your pharmacy.    

## 2015-11-22 NOTE — Progress Notes (Signed)
Patient ID: Denise [name], female   DOB: 12-31-1923, 82 y.o.   MRN: [id number]    HPI: Denise [name] is a 82 y.o. female who presents to the office today for an 8 -month followup cardiology evaluation.   Denise [name] has a history of permanent atrial fibrillation and is status post AV node ablation by Dr. Mitzi Davenport and permanent pacemaker implantation in June 2006. Denise [name] has a history of hypertensive cardiomyopathy. Denise [name] has documented severe LA dilatation. On an echo in December 2012 ejection fraction was 45-50%. Denise [name] had moderate to severe MR, moderate pulmonary hypertension a nodular sclerosis of the aortic valve the  Denise [name] has a dual-chamber St. Jude identity permanent pacemaker. Denise [name] is followed by Dr. Sallyanne Kuster for her pacemaker. Pacemaker interrogation revealed normal function, and no reprogramming changes were necessary.  Denise [name] is pacemaker dependent and no escape rhythm was present.  When the pacemaker was taken down to 30 bpm.  Denise [name] had excellent lead parameters and no high ventricular rates were recorded.  Denise [name] was last evaluated by him on 07/09/2015.  In March 2014 Denise [name] broke her right hip and her right arm and required surgery by Dr. Lorre Nick. After being hospitalized Denise [name] went to a nursing facility .   Denise [name] is on Coumadin for anticoagulation and denies bleeding.  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  Denise [name] denies recent chest pain. Denise [name] denies significant shortness of breath. At times Denise [name] does note some mild leg swelling. Denise [name] walks with a walker. Denise [name] denies recent episodes of chest pain.  Denise [name] denies presyncope or syncope.   He was recently evaluated by Dr. Legrand Como Alzheimer is felt to have bilateral goiter.  Denise [name] tells me Denise [name] will be undergoing an MRI of her neck today.  Denise [name] had been hospitalized in [place] 1 week ago and was treated with IV Lasix.  Denise [name] presents for follow-up evaluation.  Past Medical History:  Diagnosis Date  . CHF (congestive heart failure) ([place])    . Diabetes mellitus without complication ([place])   . Hypertension   . LVH (left ventricular hypertrophy)   . Mitral regurgitation    mod to severe  . Permanent atrial fibrillation ([place])   . Pulmonary hypertension (HCC)    severe  . Thyroid disease   . Tricuspid regurgitation    severe    Past Surgical History:  Procedure Laterality Date  . ABDOMINAL HYSTERECTOMY    . CARDIOVERSION  5 /18/ 2000   unsuccessful  . CHOLECYSTECTOMY    . COMPRESSION HIP SCREW Right 06/06/2012   Procedure: Open Reduction Internal Fixation Right HIp;  Surgeon: Vickey Huger, MD;  Location: [facility];  Service: Orthopedics;  Laterality: Right;  . ESOPHAGOGASTRODUODENOSCOPY N/A 06/09/2015   Procedure: ESOPHAGOGASTRODUODENOSCOPY (EGD);  Surgeon: Wilford Corner, MD;  Location[facility] ENDOSCOPY;  Service: Endoscopy;  Laterality: N/A;  . knee replacement     R  . NM MYOCAR PERF WALL MOTION  02/28/2009   No signficant ischemia  . PACEMAKER INSERTION  05/26/2004   St.Jude  . THYROIDECTOMY    . TUBAL LIGATION    . US ECHOCARDIOGRAPHY  02/25/2011   mod to severe MR,LA severe dilated,mild annular ca+,mod. TR,mod PI,mod ca+ of the aortic valve leaflet    Allergies  Allergen Reactions  . Iohexol Rash     Desc: VOMITING-VERIFIED ON 05-24-04 PRE-PROCEDURE/ ARS   . Shrimp [Shellfish Allergy] Nausea And Vomiting and Rash    Current Outpatient Prescriptions  Medication Sig Dispense  Refill  . amLODipine (NORVASC) 5 MG tablet Take 1 tablet (5 mg total) by mouth every morning. 30 tablet 0  . atorvastatin (LIPITOR) 10 MG tablet Take 10 mg by mouth every evening.    . cephALEXin (KEFLEX) 500 MG capsule Take 1 capsule by mouth as directed.    . hydrochlorothiazide (HYDRODIURIL) 25 MG tablet Take 12.5 mg by mouth every morning.    [name] HYDROcodone-acetaminophen (NORCO) 5-325 MG tablet Take 1 tablet by mouth every 6 (six) hours as needed for moderate pain. 15 tablet 0  . isosorbide mononitrate (IMDUR) 30 MG 24 hr tablet Take 1  tablet (30 mg total) by mouth every morning. 90 tablet 1  . methimazole (TAPAZOLE) 10 MG tablet Take 10 mg by mouth every morning.    . metoprolol succinate (TOPROL-XL) 50 MG 24 hr tablet Take 1 tablet (50 mg total) by mouth daily. Take with or immediately following a meal. 30 tablet 0  . pantoprazole (PROTONIX) 40 MG tablet Take 1 tablet (40 mg total) by mouth daily. 30 tablet 0  . potassium chloride SA (K-DUR,KLOR-CON) 20 MEQ tablet Take 20 mEq by mouth daily.     . predniSONE (DELTASONE) 50 MG tablet Take 1 pill daily for 5 days. 5 tablet 0  . ramipril (ALTACE) 5 MG capsule Take 1 capsule (5 mg total) by mouth daily. 30 capsule 6  . warfarin (COUMADIN) 5 MG tablet Take 1 tablet (5 mg total) by mouth daily. 5 mg every day 40 tablet 2   No current facility-administered medications for this visit.     Socially Denise [name] is widowed and has 4 children 4 grandchildren 4 great-grandchildren. There is no tobacco or alcohol use.  ROS General: Negative; No fevers, chills, or night sweats;  HEENT: Negative; No changes in vision or hearing, sinus congestion, difficulty swallowing Pulmonary: Negative; No cough, wheezing, shortness of breath, hemoptysis Cardiovascular: Negative; No chest pain, presyncope, syncope, palpitations Mild intermittent lower extremity edema GI: Negative; No nausea, vomiting, diarrhea, or abdominal pain GU: Negative; No dysuria, hematuria, or difficulty voiding Musculoskeletal: Negative; no myalgias, joint pain, or weakness Hematologic/Oncology: Negative; no easy bruising, bleeding Endocrine: Positive for diabetes mellitus and thyroid disease. Neuro: Negative; no changes in balance, headaches Skin: Negative; No rashes or skin lesions Psychiatric: Negative; No behavioral problems, depression Sleep: Negative; No snoring, daytime sleepiness, hypersomnolence, bruxism, restless legs, hypnogognic hallucinations, no cataplexy Other comprehensive 14 point system review is  negative.  PE BP 114/64   Pulse 72   Ht '5\' 1"'$  (1.549 m)   Wt 155 lb 12.8 oz (70.7 kg)   BMI 29.44 kg/m   Repeat blood pressure by me 120/70  Wt Readings from Last 3 Encounters:  11/20/15 155 lb 12.8 oz (70.7 kg)  07/09/15 158 lb 6.4 oz (71.8 kg)  06/10/15 164 lb 3.9 oz (74.5 kg)   General: Alert, oriented, no distress.  Skin: normal turgor, no rashes HEENT: Normocephalic, atraumatic. Pupils round and reactive; sclera anicteric;no lid lag,  Nose without nasal septal hypertrophy Mouth/Parynx benign; Mallinpatti scale 3 Neck: No JVD, palpable masses bilaterally which may represent goiter. Lungs: clear to ausculatation and percussion; no wheezing or rales Chest wall: No tenderness to palpation Heart: RRR, s1 s2 normal 2/6 systolic murmur; no S3 gallop.  No diastolic murmur rubs thrills or heaves. Abdomen: Moderate central adiposity. soft, nontender; no hepatosplenomehaly, BS+; abdominal aorta nontender and not dilated by palpation. Back: No CVA tenderness Pulses 2+ Extremities: Trace bilateral ankle edema. no clubbing cyanosis., Homan's sign negative  Neurologic: grossly nonfocal Psychological: Normal affect and mood  ECG (independently read by me): V paced rhythm at 72 bpm.  One PVC.  January 2017 ECG (independently read by me):  V paced rhythm at 70 bpm with underlying atrial fibrillation.  August 2016 ECG (independently read by me): 100% ventricular paced rhythm at 70 bpm with underlying atrial fibrillation.  August 2015 ECG (independently read by me): Underlying atrial fibrillation with ventricular paced rhythm at 70 beats per minute with 100% capture.  LABS BMP Latest Ref Rng & Units 11/20/2015 06/14/2015 06/07/2015  Glucose 65 - 99 mg/dL 106(H) 151(H) 107(H)  BUN 7 - 25 mg/dL 22 31(H) 28(H)  Creatinine 0.60 - 0.88 mg/dL 1.04(H) 1.20(H) 1.19(H)  Sodium 135 - 146 mmol/L 138 141 138  Potassium 3.5 - 5.3 mmol/L 3.5 3.5 3.6  Chloride 98 - 110 mmol/L 99 99(L) 101  CO2 20 - 31  mmol/L 28 - 27  Calcium 8.6 - 10.4 mg/dL 9.4 - 9.2   Hepatic Function Latest Ref Rng & Units 11/20/2015 06/07/2015 06/05/2012  Total Protein 6.1 - 8.1 g/dL 7.2 7.7 -  Albumin 3.6 - 5.1 g/dL 4.3 4.3 3.1(L)  AST 10 - 35 U/L 38(H) 25 -  ALT 6 - 29 U/L 33(H) 21 -  Alk Phosphatase 33 - 130 U/L 108 82 -  Total Bilirubin 0.2 - 1.2 mg/dL 1.3(H) 1.7(H) -   CBC Latest Ref Rng & Units 11/20/2015 06/14/2015 06/10/2015  WBC 3.8 - 10.8 K/uL 7.0 - 4.7  Hemoglobin 11.7 - 15.5 g/dL 13.8 15.0 11.7(L)  Hematocrit 35.0 - 45.0 % 42.5 44.0 37.9  Platelets 140 - 400 K/uL 158 - 138(L)   Lab Results  Component Value Date   MCV 85.9 11/20/2015   MCV 87.3 06/10/2015   MCV 87.3 06/09/2015   Lab Results  Component Value Date   TSH 5.806 (H) 06/07/2015   Lab Results  Component Value Date   HGBA1C 6.5 (H) 06/07/2015    Lipid Panel  No results found for: CHOL, TRIG, HDL, CHOLHDL, VLDL, LDLCALC, LDLDIRECT  RADIOLOGY: No results found.    ASSESSMENT AND PLAN: Denise [name] is an 82 year old female who has a history of permanent atrial fibrillation and is status post AV node ablation and implantation of a St. Jude identity dual-chamber pacemaker programmed to VVIR mode in light of her atrial fibrillation.  Denise [name] is 100% paced.  Her last pacemaker interrogation showed normal function and no reprogramming was necessary. Her ECG today shows V paced rhythm at 72 bpm.  He had been recently hospitalized and was treated with IV Lasix but I do not have the specifics of her hospital records, but I suspect Denise [name] may of had volume overload.  Her blood pressure today is stable at 120/70 on her regimen consisting of amlodipine 5 mg, HCTZ 25 mg, isosorbide 30 mg, Toprol-XL 50 mg, and retrocrural 5 mg daily.  He is on Tapazole and now takes this every other day and is followed by Dr. Eden Emms.  Denise [name] does have bilateral goiters.  An MRI is to be done later today for further evaluation.  Denise [name] is on Coumadin anticoagulation and has a  Cha2ds2Vasc score of 5 based on 2 points for age, 1 point for gender, heart failure, and hypertension.  Denise [name] is on atorvastatin for hyperlipidemia.  Denise [name] has a follow-up appointment to see Dr. Sallyanne Kuster in October for pacemaker interrogation.  I will see her in 6 months for cardiology reevaluation.  Time spent:  25 minutes  Thomas A.  Claiborne Billings, MD, [facility]  11/22/2015 6:53 PM

## 2015-12-04 ENCOUNTER — Encounter: Payer: Self-pay | Admitting: *Deleted

## 2016-01-07 ENCOUNTER — Ambulatory Visit (INDEPENDENT_AMBULATORY_CARE_PROVIDER_SITE_OTHER): Payer: Medicare Other | Admitting: Pharmacist Clinician (PhC)/ Clinical Pharmacy Specialist

## 2016-01-07 ENCOUNTER — Encounter: Payer: Self-pay | Admitting: Cardiovascular Disease

## 2016-01-07 ENCOUNTER — Ambulatory Visit (INDEPENDENT_AMBULATORY_CARE_PROVIDER_SITE_OTHER): Payer: Medicare Other | Admitting: Cardiovascular Disease

## 2016-01-07 VITALS — BP 120/70 | HR 70 | Ht 61.0 in | Wt 159.0 lb

## 2016-01-07 DIAGNOSIS — I442 Atrioventricular block, complete: Secondary | ICD-10-CM

## 2016-01-07 DIAGNOSIS — I5042 Chronic combined systolic (congestive) and diastolic (congestive) heart failure: Secondary | ICD-10-CM

## 2016-01-07 DIAGNOSIS — Z7901 Long term (current) use of anticoagulants: Secondary | ICD-10-CM | POA: Diagnosis not present

## 2016-01-07 DIAGNOSIS — Z950 Presence of cardiac pacemaker: Secondary | ICD-10-CM | POA: Diagnosis not present

## 2016-01-07 DIAGNOSIS — I4891 Unspecified atrial fibrillation: Secondary | ICD-10-CM

## 2016-01-07 DIAGNOSIS — Z79899 Other long term (current) drug therapy: Secondary | ICD-10-CM

## 2016-01-07 DIAGNOSIS — I4821 Permanent atrial fibrillation: Secondary | ICD-10-CM

## 2016-01-07 DIAGNOSIS — I10 Essential (primary) hypertension: Secondary | ICD-10-CM

## 2016-01-07 DIAGNOSIS — I482 Chronic atrial fibrillation: Secondary | ICD-10-CM

## 2016-01-07 LAB — POCT INR: INR: 2.6

## 2016-01-07 MED ORDER — FUROSEMIDE 40 MG PO TABS: 40.0000 mg | ORAL_TABLET | ORAL | 11 refills | Status: DC

## 2016-01-07 MED ORDER — POTASSIUM CHLORIDE CRYS ER 20 MEQ PO TBCR: EXTENDED_RELEASE_TABLET | ORAL | 11 refills | Status: DC

## 2016-01-07 NOTE — Patient Instructions (Signed)
Medication Instructions: Dr Royann Shivers has recommended making the following medication changes: 1. START Furosemide 40 mg - take 1 tablet 3 times weekly-Mondays, Wednesdays, and Fridays 2. INCREASE Potassium - take an extra tablet when you take Furosemide on Mondays, Wednesdays, and Fridays  Labwork: Your physician recommends that you return for lab work in 1 week.  Testing/Procedures: NONE ORDERED  Follow-up: Dr Royann Shivers recommends that you schedule a follow-up appointment in 1 month.  If you need a refill on your cardiac medications before your next appointment, please call your pharmacy.    Weigh daily. Call [phone] if weight climbs more than 3 pounds in a day or 5 pounds in a week. No salt to very little salt in your diet.  No more than 2000 mg in a day. Call if increased shortness of breath or increased swelling. Your physician recommends that you weigh, daily, at the same time every day, and in the same amount of clothing. Please record your daily weights on the handout provided and bring it to your next appointment.   Daily Weight Record It is important to weigh yourself daily. Keep this daily weight chart near your scale. Weigh yourself each morning at the same time. Weigh yourself without shoes, and wear the same amount of clothing each day. Compare today's weight to yesterday's weight. Bring this form with you to your follow-up appointments. Call your health care provider if you have concerns about your weight, including rapid weight gain or rapid weight loss. Date: ________ Weight: ____________________ Date: ________ Weight: ____________________ Date: ________ Weight: ____________________ Date: ________ Weight: ____________________ Date: ________ Weight: ____________________ Date: ________ Weight: ____________________ Date: ________ Weight: ____________________ Date: ________ Weight: ____________________ Date: ________ Weight: ____________________ Date: ________ Weight:  ____________________ Date: ________ Weight: ____________________ Date: ________ Weight: ____________________ Date: ________ Weight: ____________________ Date: ________ Weight: ____________________ Date: ________ Weight: ____________________ Date: ________ Weight: ____________________ Date: ________ Weight: ____________________ Date: ________ Weight: ____________________ Date: ________ Weight: ____________________ Date: ________ Weight: ____________________ Date: ________ Weight: ____________________ Date: ________ Weight: ____________________ Date: ________ Weight: ____________________ Date: ________ Weight: ____________________ Date: ________ Weight: ____________________ Date: ________ Weight: ____________________ Date: ________ Weight: ____________________ Date: ________ Weight: ____________________ Date: ________ Weight: ____________________ Date: ________ Weight: ____________________ Date: ________ Weight: ____________________ Date: ________ Weight: ____________________ Date: ________ Weight: ____________________ Date: ________ Weight: ____________________ Date: ________ Weight: ____________________ Date: ________ Weight: ____________________ Date: ________ Weight: ____________________ Date: ________ Weight: ____________________ Date: ________ Weight: ____________________ Date: ________ Weight: ____________________ Date: ________ Weight: ____________________ Date: ________ Weight: ____________________ Date: ________ Weight: ____________________ Date: ________ Weight: ____________________ Date: ________ Weight: ____________________ Date: ________ Weight: ____________________ Date: ________ Weight: ____________________ Date: ________ Weight: ____________________ Date: ________ Weight: ____________________ Date: ________ Weight: ____________________   This information is not intended to replace advice given to you by your health care provider. Make sure you discuss any questions you have with  your health care provider.   Document Released: 05/14/2006 Document Revised: 03/23/2014 Document Reviewed: 09/29/2013 Elsevier Interactive Patient Education [employer]

## 2016-01-07 NOTE — Progress Notes (Signed)
Patient ID: Denise [name], female   DOB: [birth year], 82 y.o.   MRN: [id number]    Cardiology Office Note    Date:  01/08/2016   ID:  LAVONA NORSWORTHY, DOB [birth year], MRN [id number]  PCP:  Daryl Eastern, MD  Cardiologist:  Nicki Guadalajara, MD;  Thurmon Fair, MD   Chief Complaint  Patient presents with  . Follow-up    History of Present Illness:  Denise [name] is a 82 y.o. female with complete heart block after AV node ablation for difficult to control atrial fibrillation who presents for pacemaker follow-up. She has a dual-chamber [employer] identity device that was implanted in 2006 but the device is programmed VVIR since she is now in permanent atrial fibrillation. Her device is not amenable for remote monitoring.  Interrogation of her pacemaker shows that she still has 1.75-2 years of estimated generator longevity. She has 97% ventricular pacing and has not had any high ventricular rates. Lead parameters remain excellent. On previous testing she has an occasional underlying escape rhythm at around 39-40 bpm, but this has not been consistently present. She continues anticoagulation with warfarin and has not had any embolic events.   Earlier this year she had problems with gastrointestinal bleeding as well as acute exacerbation of heart failure. She has not been compliant with recommended daily weights and is often noncompliant with sodium restriction. She sleeps on 2 large pillows propped halfway up, but claims this is a long-standing habit. Has not had recent gout attacks, although one occurred earlier this year.  Echocardiogram during recent hospital visit showed an ejection fraction of 45-50%, mild mitral regurgitation, moderate to severe tricuspid regurgitation with an estimated systolic PA pressure 56 mmHg. Her echo in 2012 had described moderate to severe mitral regurgitation  She denies angina, edema, dizziness, syncope or palpitations. She has chronic exertional dyspnea,  unchanged from baseline. She is extremely sedentary.  On exam she has at least moderate pedal and ankle edema. She weighs about 5 pounds more than she did when she saw Dr. Tresa Endo in September. She tries to minimize this and in general tries to make light of all of her symptoms. Her son is worried about her fluid retention. He has been trying to convince her to weigh on a more regular basis.   Past Medical History:  Diagnosis Date  . CHF (congestive heart failure) (HCC)   . Diabetes mellitus without complication (HCC)   . Hypertension   . LVH (left ventricular hypertrophy)   . Mitral regurgitation    mod to severe  . Permanent atrial fibrillation (HCC)   . Pulmonary hypertension    severe  . Thyroid disease   . Tricuspid regurgitation    severe    Past Surgical History:  Procedure Laterality Date  . ABDOMINAL HYSTERECTOMY    . CARDIOVERSION  5 /18/ 2000   unsuccessful  . CHOLECYSTECTOMY    . COMPRESSION HIP SCREW Right 06/06/2012   Procedure: Open Reduction Internal Fixation Right HIp;  Surgeon: Dannielle Huh, MD;  Location: [facility] OR;  Service: Orthopedics;  Laterality: Right;  . ESOPHAGOGASTRODUODENOSCOPY N/A 06/09/2015   Procedure: ESOPHAGOGASTRODUODENOSCOPY (EGD);  Surgeon: Charlott Rakes, MD;  Location: [facility] ENDOSCOPY;  Service: Endoscopy;  Laterality: N/A;  . knee replacement     R  . NM MYOCAR PERF WALL MOTION  02/28/2009   No signficant ischemia  . PACEMAKER INSERTION  05/26/2004   [employer]  . THYROIDECTOMY    . TUBAL LIGATION    . US  ECHOCARDIOGRAPHY  02/25/2011   mod to severe MR,LA severe dilated,mild annular ca+,mod. TR,mod PI,mod ca+ of the aortic valve leaflet    Current Medications: Outpatient Medications Prior to Visit  Medication Sig Dispense Refill  . amLODipine (NORVASC) 5 MG tablet Take 1 tablet (5 mg total) by mouth every morning. 30 tablet 0  . atorvastatin (LIPITOR) 10 MG tablet Take 10 mg by mouth every evening.    . hydrochlorothiazide (HYDRODIURIL) 25 MG  tablet Take 12.5 mg by mouth every morning.    [name] HYDROcodone-acetaminophen (NORCO) 5-325 MG tablet Take 1 tablet by mouth every 6 (six) hours as needed for moderate pain. 15 tablet 0  . isosorbide mononitrate (IMDUR) 30 MG 24 hr tablet Take 1 tablet (30 mg total) by mouth every morning. 90 tablet 1  . methimazole (TAPAZOLE) 10 MG tablet Take 10 mg by mouth every morning.    . metoprolol succinate (TOPROL-XL) 50 MG 24 hr tablet Take 1 tablet (50 mg total) by mouth daily. Take with or immediately following a meal. 30 tablet 0  . pantoprazole (PROTONIX) 40 MG tablet Take 1 tablet (40 mg total) by mouth daily. 30 tablet 0  . ramipril (ALTACE) 5 MG capsule Take 1 capsule (5 mg total) by mouth daily. 30 capsule 6  . warfarin (COUMADIN) 5 MG tablet Take 1 tablet (5 mg total) by mouth daily. 5 mg every day 40 tablet 2  . potassium chloride SA (K-DUR,KLOR-CON) 20 MEQ tablet Take 20 mEq by mouth daily.     . cephALEXin (KEFLEX) 500 MG capsule Take 1 capsule by mouth as directed.    . predniSONE (DELTASONE) 50 MG tablet Take 1 pill daily for 5 days. (Patient not taking: Reported on 01/07/2016) 5 tablet 0   No facility-administered medications prior to visit.      Allergies:   Iohexol and Shrimp [shellfish allergy]   Social History   Social History  . Marital status: Widowed    Spouse name: N/A  . Number of children: N/A  . Years of education: N/A   Social History Main Topics  . Smoking status: Never Smoker  . Smokeless tobacco: None  . Alcohol use No  . Drug use: No  . Sexual activity: No   Other Topics Concern  . None   Social History Narrative  . None     Family History:  The patient's family history includes Asthma in her mother; Cancer - Colon in her brother; Gallbladder disease in her maternal grandmother; Heart attack in her brother and father; Heart disease in her child; Stroke in her maternal grandfather and sister.   ROS:   Please see the history of present illness.    ROS  All other systems reviewed and are negative.   PHYSICAL EXAM:   VS:  BP 120/70 (BP Location: Left Arm, Patient Position: Sitting, Cuff Size: Normal)   Pulse 70   Ht 5\' 1"  (1.549 m)   Wt 159 lb (72.1 kg)   SpO2 91%   BMI 30.04 kg/m    GEN: Well nourished, well developed, in no acute distress  HEENT: normal  Neck: Very prominent V waves all the way to the angle of the jaw bilaterally, average JVP elevation 7-10 centimeters, no carotid bruits, or masses Cardiac: Paradoxically split second heart sound, RRR; 3/6 early peaking ejection murmur radiating to the carotids, 2/6 holosystolic murmur heard across the anterior precordium, no diastolic murmurs, rubs, or gallops, symmetrical 2+ pedal and ankle edema  Respiratory:  clear to auscultation  bilaterally, normal work of breathing GI: soft, nontender, nondistended, + BS MS: no deformity or atrophy  Skin: warm and dry, no rash Neuro:  Alert and Oriented x 3, Strength and sensation are intact Psych: euthymic mood, full affect  Wt Readings from Last 3 Encounters:  01/07/16 159 lb (72.1 kg)  11/20/15 155 lb 12.8 oz (70.7 kg)  07/09/15 158 lb 6.4 oz (71.8 kg)      Studies/Labs Reviewed:   EKG:  EKG is not ordered today.    Recent Labs: 06/07/2015: TSH 5.806 06/09/2015: B Natriuretic Peptide 167.3 11/20/2015: ALT 33; BUN 22; Creat 1.04; Hemoglobin 13.8; Platelets 158; Potassium 3.5; Sodium 138   Lipid Panel No results found for: CHOL, TRIG, HDL, CHOLHDL, VLDL, LDLCALC, LDLDIRECT  Additional studies/ records that were reviewed today include:  Notes from her last visit with Dr. Tresa EndoKelly    ASSESSMENT:    1. Chronic combined systolic and diastolic CHF, NYHA class 2 (HCC)   2. Long term current use of anticoagulant therapy   3. CHB (complete heart block) (HCC)   4. Permanent atrial fibrillation (HCC)   5. Essential hypertension   6. Pacemaker   7. Medication management      PLAN:  In order of problems listed above:  1. Permanent  atrial fibrillation status post AV node ablation. CHADSVasc 5 (age 21, gender, HF, HTN). 2. Warfarin: No recurrent bleeding since her last appointment. Previous GI bleeding event occurred during excessive anticoagulation with an INR of 7, apparently related to a course of steroids. 3. CHB: Pacemaker dependent following AV node ablation 4. CHF: Mildly depressed left ventricular systolic function due to nonischemic cardiomyopathy on ACE inhibitors and beta blockers. At least in part her cardiomyopathy is probably due to right ventricular pacing with a synchronous left ventricular activation, but she does not appear to need CRT. She has evidence of acute volume overload today. View the importance of sodium restriction and daily weight monitoring, but she offers resistance to both suggestions. Her son is much more supportive. Will add intermittent loop diuretic. Her loud holosystolic murmur is probably related tricuspid regurgitation but she may also have underlying mitral regurgitation (this has been evaluated quite variably on previous echoes). 5. HTN: Excellent blood pressure control 6. Normal pacemaker function. Her device is not amenable to remote follow-up. Will continue clinic checks at least every 6 months, preferably every 3 months especially when we approach elective replacement indicator for her battery. She will continue to alternate between visits with Dr. Tresa EndoKelly and pacemaker checks with me. That way, her device will be checked every 3 months.   Medication Adjustments/Labs and Tests Ordered: Current medicines are reviewed at length with the patient today.  Concerns regarding medicines are outlined above.  Medication changes, Labs and Tests ordered today are listed in the Patient Instructions below. Patient Instructions  Medication Instructions: Dr Royann Shiversroitoru has recommended making the following medication changes: 1. START Furosemide 40 mg - take 1 tablet 3 times weekly-Mondays, Wednesdays, and  Fridays 2. INCREASE Potassium - take an extra tablet when you take Furosemide on Mondays, Wednesdays, and Fridays  Labwork: Your physician recommends that you return for lab work in 1 week.  Testing/Procedures: NONE ORDERED  Follow-up: Dr Royann Shiversroitoru recommends that you schedule a follow-up appointment in 1 month.  If you need a refill on your cardiac medications before your next appointment, please call your pharmacy.    Weigh daily. Call [phone] if weight climbs more than 3 pounds in a day or 5 pounds  in a week. No salt to very little salt in your diet.  No more than 2000 mg in a day. Call if increased shortness of breath or increased swelling. Your physician recommends that you weigh, daily, at the same time every day, and in the same amount of clothing. Please record your daily weights on the handout provided and bring it to your next appointment.   Daily Weight Record It is important to weigh yourself daily. Keep this daily weight chart near your scale. Weigh yourself each morning at the same time. Weigh yourself without shoes, and wear the same amount of clothing each day. Compare today's weight to yesterday's weight. Bring this form with you to your follow-up appointments. Call your health care provider if you have concerns about your weight, including rapid weight gain or rapid weight loss. Date: ________ Weight: ____________________ Date: ________ Weight: ____________________ Date: ________ Weight: ____________________ Date: ________ Weight: ____________________ Date: ________ Weight: ____________________ Date: ________ Weight: ____________________ Date: ________ Weight: ____________________ Date: ________ Weight: ____________________ Date: ________ Weight: ____________________ Date: ________ Weight: ____________________ Date: ________ Weight: ____________________ Date: ________ Weight: ____________________ Date: ________ Weight: ____________________ Date: ________ Weight:  ____________________ Date: ________ Weight: ____________________ Date: ________ Weight: ____________________ Date: ________ Weight: ____________________ Date: ________ Weight: ____________________ Date: ________ Weight: ____________________ Date: ________ Weight: ____________________ Date: ________ Weight: ____________________ Date: ________ Weight: ____________________ Date: ________ Weight: ____________________ Date: ________ Weight: ____________________ Date: ________ Weight: ____________________ Date: ________ Weight: ____________________ Date: ________ Weight: ____________________ Date: ________ Weight: ____________________ Date: ________ Weight: ____________________ Date: ________ Weight: ____________________ Date: ________ Weight: ____________________ Date: ________ Weight: ____________________ Date: ________ Weight: ____________________ Date: ________ Weight: ____________________ Date: ________ Weight: ____________________ Date: ________ Weight: ____________________ Date: ________ Weight: ____________________ Date: ________ Weight: ____________________ Date: ________ Weight: ____________________ Date: ________ Weight: ____________________ Date: ________ Weight: ____________________ Date: ________ Weight: ____________________ Date: ________ Weight: ____________________ Date: ________ Weight: ____________________ Date: ________ Weight: ____________________ Date: ________ Weight: ____________________ Date: ________ Weight: ____________________ Date: ________ Weight: ____________________ Date: ________ Weight: ____________________ Date: ________ Weight: ____________________   This information is not intended to replace advice given to you by your health care provider. Make sure you discuss any questions you have with your health care provider.   Document Released: 05/14/2006 Document Revised: 03/23/2014 Document Reviewed: 09/29/2013 Elsevier Interactive Patient Education [address]     Signed, Thurmon Fair, MD  01/08/2016 2:26 PM    [facility] [address] [place], [place], [place]  [address] Phone: [phone]; Fax: [phone]

## 2016-02-04 ENCOUNTER — Other Ambulatory Visit: Payer: Self-pay | Admitting: Pharmacist

## 2016-02-04 ENCOUNTER — Other Ambulatory Visit: Payer: Self-pay | Admitting: Cardiovascular Disease

## 2016-02-04 MED ORDER — WARFARIN SODIUM 5 MG PO TABS: ORAL_TABLET | ORAL | 0 refills | Status: DC

## 2016-02-04 NOTE — Telephone Encounter (Signed)
Rx was send into pt pharmacy today

## 2016-02-04 NOTE — Telephone Encounter (Signed)
Order for warfarin already sent today

## 2016-02-04 NOTE — Telephone Encounter (Signed)
°*  STAT* If patient is at the pharmacy, call can be transferred to refill team.   1. Which medications need to be refilled? (please list name of each medication and dose if known) Warfarin 5 mg  2. Which pharmacy/location (including street and city if local pharmacy) is medication to be sent to?[phone]  3. Do they need a 30 day or 90 day supply? 90 and refills

## 2016-02-13 ENCOUNTER — Telehealth: Payer: Self-pay | Admitting: Cardiovascular Disease

## 2016-02-13 NOTE — Telephone Encounter (Signed)
Pt states she is suppose to have a coum appt the same time as her visit she wants to know if this is correct.She was told I dont see an appt made or an order.

## 2016-02-13 NOTE — Telephone Encounter (Signed)
Schedule [facility] appt for pt to see her as well  Pt notified will be here tomorrow

## 2016-02-14 ENCOUNTER — Ambulatory Visit (INDEPENDENT_AMBULATORY_CARE_PROVIDER_SITE_OTHER): Payer: Medicare Other | Admitting: Cardiovascular Disease

## 2016-02-14 ENCOUNTER — Ambulatory Visit (INDEPENDENT_AMBULATORY_CARE_PROVIDER_SITE_OTHER): Payer: Medicare Other | Admitting: Pharmacist

## 2016-02-14 ENCOUNTER — Encounter: Payer: Self-pay | Admitting: Cardiovascular Disease

## 2016-02-14 VITALS — BP 110/56 | HR 70 | Ht 61.0 in | Wt 150.4 lb

## 2016-02-14 DIAGNOSIS — I482 Chronic atrial fibrillation: Secondary | ICD-10-CM

## 2016-02-14 DIAGNOSIS — I5042 Chronic combined systolic (congestive) and diastolic (congestive) heart failure: Secondary | ICD-10-CM

## 2016-02-14 DIAGNOSIS — Z950 Presence of cardiac pacemaker: Secondary | ICD-10-CM | POA: Diagnosis not present

## 2016-02-14 DIAGNOSIS — I4821 Permanent atrial fibrillation: Secondary | ICD-10-CM

## 2016-02-14 DIAGNOSIS — I10 Essential (primary) hypertension: Secondary | ICD-10-CM

## 2016-02-14 DIAGNOSIS — Z7901 Long term (current) use of anticoagulants: Secondary | ICD-10-CM

## 2016-02-14 DIAGNOSIS — I442 Atrioventricular block, complete: Secondary | ICD-10-CM

## 2016-02-14 LAB — POCT INR: INR: 2.3

## 2016-02-14 MED ORDER — FUROSEMIDE 40 MG PO TABS: 40.0000 mg | ORAL_TABLET | ORAL | 3 refills | Status: DC

## 2016-02-14 NOTE — Patient Instructions (Signed)
Dr Royann Shivers recommends that you schedule a follow-up appointment in 6 months. You will receive a reminder letter in the mail two months in advance. If you don't receive a letter, please call our office to schedule the follow-up appointment.  If you need a refill on your cardiac medications before your next appointment, please call your pharmacy.   Introduction It is important to weigh yourself daily. Keep this daily weight chart near your scale. Weigh yourself each morning at the same time. Weigh yourself without shoes, and wear the same amount of clothing each day. Compare today's weight to yesterday's weight. Bring this form with you to your follow-up appointments. Call your health care provider if you have concerns about your weight, including rapid weight gain or rapid weight loss.  Date: ________ Weight: ____________________ Date: ________ Weight: ____________________ Date: ________ Weight: ____________________ Date: ________ Weight: ____________________ Date: ________ Weight: ____________________ Date: ________ Weight: ____________________ Date: ________ Weight: ____________________ Date: ________ Weight: ____________________ Date: ________ Weight: ____________________ Date: ________ Weight: ____________________ Date: ________ Weight: ____________________ Date: ________ Weight: ____________________ Date: ________ Weight: ____________________ Date: ________ Weight: ____________________ Date: ________ Weight: ____________________ Date: ________ Weight: ____________________ Date: ________ Weight: ____________________ Date: ________ Weight: ____________________ Date: ________ Weight: ____________________ Date: ________ Weight: ____________________ Date: ________ Weight: ____________________ Date: ________ Weight: ____________________ Date: ________ Weight: ____________________ Date: ________ Weight: ____________________ Date: ________ Weight: ____________________ Date: ________ Weight:  ____________________ Date: ________ Weight: ____________________ Date: ________ Weight: ____________________ Date: ________ Weight: ____________________ Date: ________ Weight: ____________________ Date: ________ Weight: ____________________ Date: ________ Weight: ____________________ Date: ________ Weight: ____________________ Date: ________ Weight: ____________________ Date: ________ Weight: ____________________ Date: ________ Weight: ____________________ Date: ________ Weight: ____________________ Date: ________ Weight: ____________________ Date: ________ Weight: ____________________ Date: ________ Weight: ____________________ Date: ________ Weight: ____________________ Date: ________ Weight: ____________________ Date: ________ Weight: ____________________ Date: ________ Weight: ____________________ Date: ________ Weight: ____________________ Date: ________ Weight: ____________________ Date: ________ Weight: ____________________ Date: ________ Weight: ____________________ Date: ________ Weight: ____________________ Date: ________ Weight: ____________________ Date: ________ Weight: ____________________ Date: ________ Weight: ____________________ Date: ________ Weight: ____________________ Date: ________ Weight: ____________________ Date: ________ Weight: ____________________ Date: ________ Weight: ____________________ Date: ________ Weight: ____________________ Date: ________ Weight: ____________________ Date: ________ Weight: ____________________ Date: ________ Weight: ____________________ Date: ________ Weight: ____________________ Date: ________ Weight: ____________________ Date: ________ Weight: ____________________ Date: ________ Weight: ____________________ Date: ________ Weight: ____________________ Date: ________ Weight: ____________________ Date: ________ Weight: ____________________ Date: ________ Weight: ____________________ Date: ________ Weight: ____________________ Date: ________  Weight: ____________________ Date: ________ Weight: ____________________ Date: ________ Weight: ____________________ Date: ________ Weight: ____________________ Date: ________ Weight: ____________________ Date: ________ Weight: ____________________ Date: ________ Weight: ____________________ Date: ________ Weight: ____________________ Date: ________ Weight: ____________________ Date: ________ Weight: ____________________ Date: ________ Weight: ____________________ Date: ________ Weight: ____________________ Date: ________ Weight: ____________________ Date: ________ Weight: ____________________ Date: ________ Weight: ____________________ Date: ________ Weight: ____________________ Date: ________ Weight: ____________________ Date: ________ Weight: ____________________ Date: ________ Weight: ____________________ Date: ________ Weight: ____________________ Date: ________ Weight: ____________________ Date: ________ Weight: ____________________ Date: ________ Weight: ____________________ Date: ________ Weight: ____________________ Date: ________ Weight: ____________________ Date: ________ Weight: ____________________ Date: ________ Weight: ____________________ Date: ________ Weight: ____________________ Date: ________ Weight: ____________________ Date: ________ Weight: ____________________ Date: ________ Weight: ____________________ Date: ________ Weight: ____________________ Date: ________ Weight: ____________________ Date: ________ Weight: ____________________ Date: ________ Weight: ____________________ Date: ________ Weight: ____________________ Date: ________ Weight: ____________________ Date: ________ Weight: ____________________ Date: ________ Weight: ____________________ Date: ________ Weight: ____________________ Date: ________ Weight: ____________________ Date: ________ Weight: ____________________ Date: ________ Weight: ____________________ Date: ________ Weight: ____________________ Date:  ________ Weight: ____________________ Date: ________ Weight: ____________________ Date: ________ Weight: ____________________  Date: ________ Weight: ____________________ Date: ________ Weight: ____________________ Date: ________ Weight: ____________________ Date: ________ Weight: ____________________ Date: ________ Weight: ____________________ Date: ________ Weight: ____________________ Date: ________ Weight: ____________________ Date: ________ Weight: ____________________ Date: ________ Weight: ____________________ Date: ________ Weight: ____________________ Date: ________ Weight: ____________________ Date: ________ Weight: ____________________ Date: ________ Weight: ____________________ Date: ________ Weight: ____________________ Date: ________ Weight: ____________________ Date: ________ Weight: ____________________ Date: ________ Weight: ____________________ Date: ________ Weight: ____________________ Date: ________ Weight: ____________________ Date: ________ Weight: ____________________ Date: ________ Weight: ____________________ Date: ________ Weight: ____________________ Date: ________ Weight: ____________________ Date: ________ Weight: ____________________ Date: ________ Weight: ____________________ Date: ________ Weight: ____________________ Date: ________ Weight: ____________________ Date: ________ Weight: ____________________ Date: ________ Weight: ____________________ Date: ________ Weight: ____________________ Date: ________ Weight: ____________________ Date: ________ Weight: ____________________ Date: ________ Weight: ____________________ Date: ________ Weight: ____________________ Date: ________ Weight: ____________________ Date: ________ Weight: ____________________ Date: ________ Weight: ____________________ Date: ________ Weight: ____________________ Date: ________ Weight: ____________________ Date: ________ Weight: ____________________ Date: ________ Weight:  ____________________ Date: ________ Weight: ____________________ Date: ________ Weight: ____________________ Date: ________ Weight: ____________________ Date: ________ Weight: ____________________ Date: ________ Weight: ____________________ Date: ________ Weight: ____________________ Date: ________ Weight: ____________________ Date: ________ Weight: ____________________ Date: ________ Weight: ____________________ Date: ________ Weight: ____________________ Date: ________ Weight: ____________________ Date: ________ Weight: ____________________ Date: ________ Weight: ____________________ Date: ________ Weight: ____________________ Date: ________ Weight: ____________________ Date: ________ Weight: ____________________ Date: ________ Weight: ____________________ Date: ________ Weight: ____________________ Date: ________ Weight: ____________________ Date: ________ Weight: ____________________ Date: ________ Weight: ____________________ Date: ________ Weight: ____________________ Date: ________ Weight: ____________________ Date: ________ Weight: ____________________ Date: ________ Weight: ____________________ Date: ________ Weight: ____________________ Date: ________ Weight: ____________________ Date: ________ Weight: ____________________ Date: ________ Weight: ____________________ Date: ________ Weight: ____________________ Date: ________ Weight: ____________________ Date: ________ Weight: ____________________ Date: ________ Weight: ____________________ Date: ________ Weight: ____________________ Date: ________ Weight: ____________________ Date: ________ Weight: ____________________ Date: ________ Weight: ____________________ Date: ________ Weight: ____________________ Date: ________ Weight: ____________________ Date: ________ Weight: ____________________ Date: ________ Weight: ____________________ Date: ________ Weight: ____________________ Date: ________ Weight: ____________________ Date: ________  Weight: ____________________ Date: ________ Weight: ____________________ Date: ________ Weight: ____________________ Date: ________ Weight: ____________________ Date: ________ Weight: ____________________ Date: ________ Weight: ____________________ Date: ________ Weight: ____________________ Date: ________ Weight: ____________________ Date: ________ Weight: ____________________ Date: ________ Weight: ____________________ Date: ________ Weight: ____________________ Date: ________ Weight: ____________________ Date: ________ Weight: ____________________ Date: ________ Weight: ____________________ Date: ________ Weight: ____________________ Date: ________ Weight: ____________________ Date: ________ Weight: ____________________ Date: ________ Weight: ____________________ Date: ________ Weight: ____________________ Date: ________ Weight: ____________________ Date: ________ Weight: ____________________ Date: ________ Weight: ____________________ Date: ________ Weight: ____________________ Date: ________ Weight: ____________________ Date: ________ Weight: ____________________ Date: ________ Weight: ____________________ Date: ________ Weight: ____________________ Date: ________ Weight: ____________________ Date: ________ Weight: ____________________ Date: ________ Weight: ____________________ Date: ________ Weight: ____________________ Date: ________ Weight: ____________________ Date: ________ Weight: ____________________ Date: ________ Weight: ____________________ Date: ________ Weight: ____________________ Date: ________ Weight: ____________________ Date: ________ Weight: ____________________ Date: ________ Weight: ____________________ Date: ________ Weight: ____________________ Date: ________ Weight: ____________________ Date: ________ Weight: ____________________ Date: ________ Weight: ____________________ Date: ________ Weight: ____________________ Date: ________ Weight: ____________________ Date:  ________ Weight: ____________________ Date: ________ Weight: ____________________ Date: ________ Weight: ____________________ Date: ________ Weight: ____________________ Date: ________ Weight: ____________________ Date: ________ Weight: ____________________ Date: ________ Weight: ____________________ Date: ________ Weight: ____________________ Date: ________ Weight: ____________________ Date: ________ Weight: ____________________ Date: ________ Weight: ____________________ Date: ________ Weight: ____________________ Date: ________ Weight: ____________________ Date: ________ Weight: ____________________ Date: ________ Weight: ____________________ Date: ________ Weight: ____________________ Date: ________ Weight: ____________________ Date: ________ Weight: ____________________ Date: ________ Weight: ____________________ Date: ________ Weight: ____________________ Date: ________ Weight: ____________________ Date:  ________ Weight: ____________________ Date: ________ Weight: ____________________ Date: ________ Weight: ____________________ Date: ________ Weight: ____________________ Date: ________ Weight: ____________________ Date: ________ Weight: ____________________ Date: ________ Weight: ____________________ Date: ________ Weight: ____________________ Date: ________ Weight: ____________________ Date: ________ Weight: ____________________ Date: ________ Weight: ____________________ Date: ________ Weight: ____________________ Date: ________ Weight: ____________________ Date: ________ Weight: ____________________ Date: ________ Weight: ____________________ Date: ________ Weight: ____________________ Date: ________ Weight: ____________________ Date: ________ Weight: ____________________ Date: ________ Weight: ____________________ Date: ________ Weight: ____________________ Date: ________ Weight: ____________________ Date: ________ Weight: ____________________ Date: ________ Weight:  ____________________ Date: ________ Weight: ____________________ Date: ________ Weight: ____________________ Date: ________ Weight: ____________________ Date: ________ Weight: ____________________ Date: ________ Weight: ____________________ Date: ________ Weight: ____________________ Date: ________ Weight: ____________________ Date: ________ Weight: ____________________ Date: ________ Weight: ____________________ Date: ________ Weight: ____________________ Date: ________ Weight: ____________________ Date: ________ Weight: ____________________ Date: ________ Weight: ____________________ Date: ________ Weight: ____________________ Date: ________ Weight: ____________________ Date: ________ Weight: ____________________ Date: ________ Weight: ____________________ Date: ________ Weight: ____________________ Date: ________ Weight: ____________________ Date: ________ Weight: ____________________ Date: ________ Weight: ____________________ Date: ________ Weight: ____________________ Date: ________ Weight: ____________________ Date: ________ Weight: ____________________ Date: ________ Weight: ____________________ Date: ________ Weight: ____________________ Date: ________ Weight: ____________________ Date: ________ Weight: ____________________ Date: ________ Weight: ____________________ Date: ________ Weight: ____________________ Date: ________ Weight: ____________________ Date: ________ Weight: ____________________ Date: ________ Weight: ____________________ Date: ________ Weight: ____________________ Date: ________ Weight: ____________________ Date: ________ Weight: ____________________ Date: ________ Weight: ____________________ Date: ________ Weight: ____________________ Date: ________ Weight: ____________________ Date: ________ Weight: ____________________ Date: ________ Weight: ____________________ Date: ________ Weight: ____________________ Date: ________ Weight: ____________________ Date: ________  Weight: ____________________ Date: ________ Weight: ____________________ Date: ________ Weight: ____________________ Date: ________ Weight: ____________________ Date: ________ Weight: ____________________ Date: ________ Weight: ____________________ Date: ________ Weight: ____________________ Date: ________ Weight: ____________________ Date: ________ Weight: ____________________ Date: ________ Weight: ____________________ Date: ________ Weight: ____________________ Date: ________ Weight: ____________________ Date: ________ Weight: ____________________ Date: ________ Weight: ____________________ Date: ________ Weight: ____________________ Date: ________ Weight: ____________________ Date: ________ Weight: ____________________ Date: ________ Weight: ____________________ Date: ________ Weight: ____________________ Date: ________ Weight: ____________________ Date: ________ Weight: ____________________ Date: ________ Weight: ____________________ Date: ________ Weight: ____________________ Date: ________ Weight: ____________________ Date: ________ Weight: ____________________ Date: ________ Weight: ____________________ Date: ________ Weight: ____________________ Date: ________ Weight: ____________________ Date: ________ Weight: ____________________ Date: ________ Weight: ____________________ Date: ________ Weight: ____________________ Date: ________ Weight: ____________________ Date: ________ Weight: ____________________ Date: ________ Weight: ____________________ Date: ________ Weight: ____________________ Date: ________ Weight: ____________________ Date: ________ Weight: ____________________ Date: ________ Weight: ____________________ Date: ________ Weight: ____________________ Date: ________ Weight: ____________________ Date: ________ Weight: ____________________ Date: ________ Weight: ____________________ Date: ________ Weight: ____________________ Date: ________ Weight: ____________________ Date:  ________ Weight: ____________________ Date: ________ Weight: ____________________ Date: ________ Weight: ____________________ Date: ________ Weight: ____________________ Date: ________ Weight: ____________________ Date: ________ Weight: ____________________ Date: ________ Weight: ____________________ Date: ________ Weight: ____________________ Date: ________ Weight: ____________________ Date: ________ Weight: ____________________ Date: ________ Weight: ____________________ Date: ________ Weight: ____________________ Date: ________ Weight: ____________________ Date: ________ Weight: ____________________ Date: ________ Weight: ____________________ Date: ________ Weight: ____________________ Date: ________ Weight: ____________________ Date: ________ Weight: ____________________ Date: ________ Weight: ____________________ Date: ________ Weight: ____________________ Date: ________ Weight: ____________________ Date: ________ Weight: ____________________ Date: ________ Weight: ____________________ Date: ________ Weight: ____________________ Date: ________ Weight: ____________________ Date: ________ Weight: ____________________ Date: ________ Weight: ____________________ Date: ________ Weight: ____________________ Date: ________ Weight: ____________________ Date: ________ Weight: ____________________ Date: ________ Weight: ____________________ Date: ________ Weight: ____________________ Date: ________ Weight: ____________________ Date: ________ Weight: ____________________ Date: ________ Weight: ____________________ Date: ________ Weight: ____________________ Date: ________ Weight: ____________________ Date: ________ Weight: ____________________ Date: ________ Weight: ____________________ Date: ________  Weight: ____________________ Date: ________ Weight: ____________________ Date: ________ Weight: ____________________ Date: ________ Weight: ____________________ Date: ________ Weight:  ____________________ Date: ________ Weight: ____________________ Date: ________ Weight: ____________________ Date: ________ Weight: ____________________ Date: ________ Weight: ____________________ Date: ________ Weight: ____________________ Date: ________ Weight: ____________________ Date: ________ Weight: ____________________ Date: ________ Weight: ____________________ Date: ________ Weight: ____________________ Date: ________ Weight: ____________________ Date: ________ Weight: ____________________ Date: ________ Weight: ____________________ Date: ________ Weight: ____________________ Date: ________ Weight: ____________________ Date: ________ Weight: ____________________ Date: ________ Weight: ____________________ Date: ________ Weight: ____________________ Date: ________ Weight: ____________________ Date: ________ Weight: ____________________ Date: ________ Weight: ____________________ Date: ________ Weight: ____________________ Date: ________ Weight: ____________________ Date: ________ Weight: ____________________ Date: ________ Weight: ____________________ Date: ________ Weight: ____________________ Date: ________ Weight: ____________________ Date: ________ Weight: ____________________ Date: ________ Weight: ____________________ Date: ________ Weight: ____________________ Date: ________ Weight: ____________________ Date: ________ Weight: ____________________ Date: ________ Weight: ____________________ Date: ________ Weight: ____________________ Date: ________ Weight: ____________________ Date: ________ Weight: ____________________ Date: ________ Weight: ____________________ Date: ________ Weight: ____________________ Date: ________ Weight: ____________________ Date: ________ Weight: ____________________ Date: ________ Weight: ____________________ Date: ________ Weight: ____________________ Date: ________ Weight: ____________________ Date: ________ Weight: ____________________ Date: ________  Weight: ____________________ Date: ________ Weight: ____________________ Date: ________ Weight: ____________________ Date: ________ Weight: ____________________ Date: ________ Weight: ____________________ Date: ________ Weight: ____________________ Date: ________ Weight: ____________________ Date: ________ Weight: ____________________ Date: ________ Weight: ____________________ Date: ________ Weight: ____________________ Date: ________ Weight: ____________________ Date: ________ Weight: ____________________ Date: ________ Weight: ____________________ Date: ________ Weight: ____________________ Date: ________ Weight: ____________________ Date: ________ Weight: ____________________ Date: ________ Weight: ____________________ Date: ________ Weight: ____________________ Date: ________ Weight: ____________________ Date: ________ Weight: ____________________ Date: ________ Weight: ____________________ Date: ________ Weight: ____________________ Date: ________ Weight: ____________________ Date: ________ Weight: ____________________ Date: ________ Weight: ____________________ Date: ________ Weight: ____________________ Date: ________ Weight: ____________________ Date: ________ Weight: ____________________ Date: ________ Weight: ____________________ Date: ________ Weight: ____________________ Date: ________ Weight: ____________________ Date: ________ Weight: ____________________ Date: ________ Weight: ____________________ Date: ________ Weight: ____________________ Date: ________ Weight: ____________________ Date: ________ Weight: ____________________ Date: ________ Weight: ____________________ Date: ________ Weight: ____________________ Date: ________ Weight: ____________________ Date: ________ Weight: ____________________ Date: ________ Weight: ____________________ Date: ________ Weight: ____________________ Date: ________ Weight: ____________________ Date: ________ Weight: ____________________ Date:  ________ Weight: ____________________ Date: ________ Weight: ____________________ Date: ________ Weight: ____________________ Date: ________ Weight: ____________________ Date: ________ Weight: ____________________ Date: ________ Weight: ____________________ Date: ________ Weight: ____________________ Date: ________ Weight: ____________________ Date: ________ Weight: ____________________ Date: ________ Weight: ____________________ Date: ________ Weight: ____________________ Date: ________ Weight: ____________________ Date: ________ Weight: ____________________ Date: ________ Weight: ____________________ Date: ________ Weight: ____________________ Date: ________ Weight: ____________________ Date: ________ Weight: ____________________ Date: ________ Weight: ____________________ Date: ________ Weight: ____________________ Date: ________ Weight: ____________________ Date: ________ Weight: ____________________ Date: ________ Weight: ____________________ Date: ________ Weight: ____________________ Date: ________ Weight: ____________________ Date: ________ Weight: ____________________ Date: ________ Weight: ____________________ Date: ________ Weight: ____________________ Date: ________ Weight: ____________________ Date: ________ Weight: ____________________ Date: ________ Weight: ____________________ Date: ________ Weight: ____________________ Date: ________ Weight: ____________________ Date: ________ Weight: ____________________ Date: ________ Weight: ____________________ Date: ________ Weight: ____________________ Date: ________ Weight: ____________________ Date: ________ Weight: ____________________ Date: ________ Weight: ____________________ Date: ________ Weight: ____________________ Date: ________ Weight: ____________________ Date: ________ Weight: ____________________ Date: ________ Weight: ____________________ Date: ________ Weight: ____________________ Date: ________ Weight:  ____________________ Date: ________ Weight: ____________________ Date: ________ Weight: ____________________ Date: ________ Weight: ____________________ Date: ________ Weight: ____________________ Date: ________ Weight: ____________________ Date: ________ Weight: ____________________ Date: ________ Weight: ____________________ Date: ________ Weight: ____________________ Date: ________ Weight: ____________________ Date: ________ Weight: ____________________ Date: ________ Weight: ____________________ Date: ________ Weight: ____________________ Date: ________ Weight: ____________________ Date: ________  Weight: ____________________ Date: ________ Weight: ____________________ Date: ________ Weight: ____________________ Date: ________ Weight: ____________________ Date: ________ Weight: ____________________ Date: ________ Weight: ____________________ Date: ________ Weight: ____________________ Date: ________ Weight: ____________________ Date: ________ Weight: ____________________ Date: ________ Weight: ____________________ Date: ________ Weight: ____________________ Date: ________ Weight: ____________________ Date: ________ Weight: ____________________ Date: ________ Weight: ____________________ Date: ________ Weight: ____________________ Date: ________ Weight: ____________________ Date: ________ Weight: ____________________ Date: ________ Weight: ____________________ Date: ________ Weight: ____________________ Date: ________ Weight: ____________________ Date: ________ Weight: ____________________ Date: ________ Weight: ____________________ Date: ________ Weight: ____________________ Date: ________ Weight: ____________________ Date: ________ Weight: ____________________ Date: ________ Weight: ____________________ Date: ________ Weight: ____________________ Date: ________ Weight: ____________________ Date: ________ Weight: ____________________ Date: ________ Weight: ____________________ Date: ________  Weight: ____________________ Date: ________ Weight: ____________________ Date: ________ Weight: ____________________ Date: ________ Weight: ____________________ Date: ________ Weight: ____________________ Date: ________ Weight: ____________________ Date: ________ Weight: ____________________ Date: ________ Weight: ____________________ Date: ________ Weight: ____________________ Date: ________ Weight: ____________________ Date: ________ Weight: ____________________ Date: ________ Weight: ____________________ Date: ________ Weight: ____________________ Date: ________ Weight: ____________________ Date: ________ Weight: ____________________ Date: ________ Weight: ____________________ Date: ________ Weight: ____________________ Date: ________ Weight: ____________________ Date: ________ Weight: ____________________ Date: ________ Weight: ____________________ Date: ________ Weight: ____________________ Date: ________ Weight: ____________________ Date: ________ Weight: ____________________ Date: ________ Weight: ____________________ Date: ________ Weight: ____________________ Date: ________ Weight: ____________________ Date: ________ Weight: ____________________ Date: ________ Weight: ____________________ Date: ________ Weight: ____________________ Date: ________ Weight: ____________________ Date: ________ Weight: ____________________ Date: ________ Weight: ____________________ Date: ________ Weight: ____________________ Date: ________ Weight: ____________________ Date: ________ Weight: ____________________ Date: ________ Weight: ____________________ Date: ________ Weight: ____________________ Date: ________ Weight: ____________________ Date: ________ Weight: ____________________ Date: ________ Weight: ____________________ Date: ________ Weight: ____________________ Date: ________ Weight: ____________________ Date: ________ Weight: ____________________ Date: ________ Weight: ____________________ Date:  ________ Weight: ____________________ Date: ________ Weight: ____________________ Date: ________ Weight: ____________________ Date: ________ Weight: ____________________ Date: ________ Weight: ____________________ Date: ________ Weight: ____________________ Date: ________ Weight: ____________________ Date: ________ Weight: ____________________ Date: ________ Weight: ____________________ Date: ________ Weight: ____________________ Date: ________ Weight: ____________________ Date: ________ Weight: ____________________ Date: ________ Weight: ____________________ Date: ________ Weight: ____________________ Date: ________ Weight: ____________________ Date: ________ Weight: ____________________ Date: ________ Weight: ____________________ Date: ________ Weight: ____________________ Date: ________ Weight: ____________________ Date: ________ Weight: ____________________ Date: ________ Weight: ____________________ Date: ________ Weight: ____________________ Date: ________ Weight: ____________________ Date: ________ Weight: ____________________ Date: ________ Weight: ____________________ Date: ________ Weight: ____________________ Date: ________ Weight: ____________________ Date: ________ Weight: ____________________ Date: ________ Weight: ____________________ Date: ________ Weight: ____________________ Date: ________ Weight: ____________________ Date: ________ Weight: ____________________ Date: ________ Weight: ____________________ Date: ________ Weight: ____________________ Date: ________ Weight: ____________________ Date: ________ Weight: ____________________ Date: ________ Weight: ____________________ Date: ________ Weight: ____________________ Date: ________ Weight: ____________________ Date: ________ Weight: ____________________ Date: ________ Weight: ____________________ Date: ________ Weight: ____________________ Date: ________ Weight: ____________________ Date: ________ Weight:  ____________________ Date: ________ Weight: ____________________ Date: ________ Weight: ____________________ Date: ________ Weight: ____________________ Date: ________ Weight: ____________________ Date: ________ Weight: ____________________ Date: ________ Weight: ____________________ Date: ________ Weight: ____________________ Date: ________ Weight: ____________________ Date: ________ Weight: ____________________ Date: ________ Weight: ____________________ Date: ________ Weight: ____________________ Date: ________ Weight: ____________________ Date: ________ Weight: ____________________ Date: ________ Weight: ____________________ Date: ________ Weight: ____________________ Date: ________ Weight: ____________________ Date: ________ Weight: ____________________ Date: ________ Weight: ____________________ Date: ________ Weight: ____________________ Date: ________ Weight: ____________________ Date: ________ Weight: ____________________ Date: ________ Weight: ____________________ Date: ________ Weight: ____________________ Date: ________ Weight: ____________________ Date: ________ Weight: ____________________ Date: ________ Weight: ____________________ Date: ________ Weight: ____________________ Date: ________ Weight: ____________________ Date: ________ Weight: ____________________ Date: ________ Weight: ____________________ Date: ________ Weight: ____________________ Date: ________ Weight:  ____________________ Date: ________ Weight: ____________________ Date: ________ Weight: ____________________ Date: ________ Weight: ____________________ Date: ________ Weight: ____________________ Date: ________ Weight: ____________________ Date: ________ Weight: ____________________ Date: ________ Weight: ____________________ Date: ________ Weight: ____________________ Date: ________ Weight: ____________________ Date: ________ Weight: ____________________ Date: ________ Weight: ____________________ Date: ________  Weight: ____________________ Date: ________ Weight: ____________________ Date: ________ Weight: ____________________ Date: ________ Weight: ____________________ Date: ________ Weight: ____________________ Date: ________ Weight: ____________________ Date: ________ Weight: ____________________ Date: ________ Weight: ____________________ Date: ________ Weight: ____________________ Date: ________ Weight: ____________________ Date: ________ Weight: ____________________ Date: ________ Weight: ____________________ Date: ________ Weight: ____________________ Date: ________ Weight: ____________________ Date: ________ Weight: ____________________ Date: ________ Weight: ____________________ Date: ________ Weight: ____________________ Date: ________ Weight: ____________________ Date: ________ Weight: ____________________ Date: ________ Weight: ____________________ Date: ________ Weight: ____________________ Date: ________ Weight: ____________________ Date: ________ Weight: ____________________ Date: ________ Weight: ____________________ Date: ________ Weight: ____________________ Date: ________ Weight: ____________________ This information is not intended to replace advice given to you by your health care provider. Make sure you discuss any questions you have with your health care provider. Document Released: 05/14/2006 Document Revised: 09/20/2015 Document Reviewed: 09/29/2013  2017 [employer]

## 2016-02-14 NOTE — Progress Notes (Signed)
Patient ID: Denise [name], female   DOB: 12-07-1923, 82 y.o.   MRN: [id number]    Cardiology Office Note    Date:  02/14/2016   ID:  Denise [name], DOB [birth year], MRN [id number]  PCP:  Daryl Eastern, MD  Cardiologist:  Nicki Guadalajara, MD;  Thurmon Fair, MD   Chief Complaint  Patient presents with  . Follow-up    History of Present Illness:  Denise [name] is a 82 y.o. female with complete heart block after AV node ablation for difficult to control atrial fibrillation who presents in follow-upAfter initiation of loop diuretic therapy for severe edema.   She has a dual-chamber [employer] identity device that was implanted in 2006 but the device is programmed VVIR since she is now in permanent atrial fibrillation. Her device is not amenable for remote monitoring.  She has had complete relief of her leg edema after starting treatment with furosemide 3 days a week. She has lost roughly 15 pounds since her last appointment. She has not had dizziness or syncope and denies palpitations. Her blood pressure remains normal. She denies dyspnea at rest or with the minimal activity that she performs. Over the last week her weight has stabilized at about 145 pounds on her home scale (R office scale shows 5 pounds higher).  Interrogation of her pacemaker shows that she still has 1.5-2 years of estimated generator longevity. She has 97% ventricular pacing and has not had any high ventricular rates. Lead parameters remain excellent. On previous testing she has an occasional underlying escape rhythm at around 39-40 bpm, but this has not been consistently present. She continues anticoagulation with warfarin and has not had any embolic events.   Echocardiogram during recent hospital visit showed an ejection fraction of 45-50%, mild mitral regurgitation, moderate to severe tricuspid regurgitation with an estimated systolic PA pressure 56 mmHg. Her echo in 2012 had described moderate to severe mitral  regurgitation   Past Medical History:  Diagnosis Date  . CHF (congestive heart failure) (HCC)   . Diabetes mellitus without complication (HCC)   . Hypertension   . LVH (left ventricular hypertrophy)   . Mitral regurgitation    mod to severe  . Permanent atrial fibrillation (HCC)   . Pulmonary hypertension    severe  . Thyroid disease   . Tricuspid regurgitation    severe    Past Surgical History:  Procedure Laterality Date  . ABDOMINAL HYSTERECTOMY    . CARDIOVERSION  5 /18/ 2000   unsuccessful  . CHOLECYSTECTOMY    . COMPRESSION HIP SCREW Right 06/06/2012   Procedure: Open Reduction Internal Fixation Right HIp;  Surgeon: Dannielle Huh, MD;  Location: [facility] OR;  Service: Orthopedics;  Laterality: Right;  . ESOPHAGOGASTRODUODENOSCOPY N/A 06/09/2015   Procedure: ESOPHAGOGASTRODUODENOSCOPY (EGD);  Surgeon: Charlott Rakes, MD;  Location: [facility] ENDOSCOPY;  Service: Endoscopy;  Laterality: N/A;  . knee replacement     R  . NM MYOCAR PERF WALL MOTION  02/28/2009   No signficant ischemia  . PACEMAKER INSERTION  05/26/2004   [employer]Jude  . THYROIDECTOMY    . TUBAL LIGATION    . US ECHOCARDIOGRAPHY  02/25/2011   mod to severe MR,LA severe dilated,mild annular ca+,mod. TR,mod PI,mod ca+ of the aortic valve leaflet    Current Medications: Outpatient Medications Prior to Visit  Medication Sig Dispense Refill  . amLODipine (NORVASC) 5 MG tablet Take 1 tablet (5 mg total) by mouth every morning. 30 tablet 0  . atorvastatin (LIPITOR) 10  MG tablet Take 10 mg by mouth every evening.    . hydrochlorothiazide (HYDRODIURIL) 25 MG tablet Take 12.5 mg by mouth every morning.    [name] HYDROcodone-acetaminophen (NORCO) 5-325 MG tablet Take 1 tablet by mouth every 6 (six) hours as needed for moderate pain. 15 tablet 0  . isosorbide mononitrate (IMDUR) 30 MG 24 hr tablet Take 1 tablet (30 mg total) by mouth every morning. 90 tablet 1  . methimazole (TAPAZOLE) 10 MG tablet Take 10 mg by mouth every morning.      . metoprolol succinate (TOPROL-XL) 50 MG 24 hr tablet Take 1 tablet (50 mg total) by mouth daily. Take with or immediately following a meal. 30 tablet 0  . pantoprazole (PROTONIX) 40 MG tablet Take 1 tablet (40 mg total) by mouth daily. 30 tablet 0  . potassium chloride SA (K-DUR,KLOR-CON) 20 MEQ tablet Take 1-2 tablets (20-40 mEq total) by mouth daily as directed 60 tablet 11  . ramipril (ALTACE) 5 MG capsule Take 1 capsule (5 mg total) by mouth daily. 30 capsule 6  . warfarin (COUMADIN) 5 MG tablet Take 1 tablet daily as directed by coumadin clinic 95 tablet 0  . furosemide (LASIX) 40 MG tablet Take 1 tablet (40 mg total) by mouth 3 (three) times a week. Mondays, Wednesdays, and Fridays 15 tablet 11   No facility-administered medications prior to visit.      Allergies:   Iohexol and Shrimp [shellfish allergy]   Social History   Social History  . Marital status: Widowed    Spouse name: N/A  . Number of children: N/A  . Years of education: N/A   Social History Main Topics  . Smoking status: Never Smoker  . Smokeless tobacco: Not on file  . Alcohol use No  . Drug use: No  . Sexual activity: No   Other Topics Concern  . Not on file   Social History Narrative  . No narrative on file     Family History:  The patient's family history includes Asthma in her mother; Cancer - Colon in her brother; Gallbladder disease in her maternal grandmother; Heart attack in her brother and father; Heart disease in her child; Stroke in her maternal grandfather and sister.   ROS:   Please see the history of present illness.    ROS All other systems reviewed and are negative.   PHYSICAL EXAM:   VS:  BP (!) 110/56 (BP Location: Right Arm, Patient Position: Sitting, Cuff Size: Normal)   Pulse 70   Ht 5\' 1"  (1.549 m)   Wt 150 lb 6.4 oz (68.2 kg)   SpO2 96%   BMI 28.42 kg/m    GEN: Well nourished, well developed, in no acute distress  HEENT: normal  Neck: Very prominent V waves all the way  to the angle of the jaw bilaterally, average JVP elevation 7-10 centimeters, no carotid bruits, or masses Cardiac: Paradoxically split second heart sound, RRR; 3/6 early peaking ejection murmur radiating to the carotids, 2/6 holosystolic murmur heard across the anterior precordium, no diastolic murmurs, rubs, or gallops, symmetrical 2+ pedal and ankle edema  Respiratory:  clear to auscultation bilaterally, normal work of breathing GI: soft, nontender, nondistended, + BS MS: no deformity or atrophy  Skin: warm and dry, no rash Neuro:  Alert and Oriented x 3, Strength and sensation are intact Psych: euthymic mood, full affect  Wt Readings from Last 3 Encounters:  02/14/16 150 lb 6.4 oz (68.2 kg)  01/07/16 159 lb (72.1 kg)  11/20/15 155 lb 12.8 oz (70.7 kg)      Studies/Labs Reviewed:   EKG:  EKG is not ordered today.    Recent Labs: 06/07/2015: TSH 5.806 06/09/2015: B Natriuretic Peptide 167.3 11/20/2015: ALT 33; BUN 22; Creat 1.04; Hemoglobin 13.8; Platelets 158; Potassium 3.5; Sodium 138     ASSESSMENT:    1. Chronic combined systolic and diastolic CHF, NYHA class 2 (HCC)   2. Permanent atrial fibrillation (HCC)   3. Long term current use of anticoagulant therapy   4. CHB (complete heart block) (HCC)   5. Essential hypertension   6. Pacemaker      PLAN:  In order of problems listed above:  1. CHF: Hypervolemia has resolved after adding loop diuretics 3 days a week. We'll continue the current medical regimen. Encourage her to continue weighing herself daily since this will provide early recognition of volume shifts. Has mildly depressed left ventricular systolic function due to nonischemic cardiomyopathy on ACE inhibitors and beta blockers. At least in part her cardiomyopathy is probably due to right ventricular pacing with a synchronous left ventricular activation, but she does not appear to need CRT.  2. Permanent atrial fibrillation status post AV node ablation. CHADSVasc 5  (age 87, gender, HF, HTN). 3. Warfarin: No recurrent bleeding since her last appointment. Previous GI bleeding event occurred during excessive anticoagulation with an INR of 7, apparently related to a course of steroids. 4. CHB: Pacemaker dependent following AV node ablation 5. HTN: Excellent blood pressure control 6. Normal pacemaker function. She will continue to alternate between visits with Dr. Tresa EndoKelly and pacemaker checks with me. That way, her device will be checked every 3 months. Next appointment with Dr. Tresa EndoKelly March 5.   Medication Adjustments/Labs and Tests Ordered: Current medicines are reviewed at length with the patient today.  Concerns regarding medicines are outlined above.  Medication changes, Labs and Tests ordered today are listed in the Patient Instructions below. Patient Instructions  Dr Royann Shiversroitoru recommends that you schedule a follow-up appointment in 6 months. You will receive a reminder letter in the mail two months in advance. If you don't receive a letter, please call our office to schedule the follow-up appointment.  If you need a refill on your cardiac medications before your next appointment, please call your pharmacy.   Introduction It is important to weigh yourself daily. Keep this daily weight chart near your scale. Weigh yourself each morning at the same time. Weigh yourself without shoes, and wear the same amount of clothing each day. Compare today's weight to yesterday's weight. Bring this form with you to your follow-up appointments. Call your health care provider if you have concerns about your weight, including rapid weight gain or rapid weight loss.  Date: ________ Weight: ____________________ Date: ________ Weight: ____________________ Date: ________ Weight: ____________________ Date: ________ Weight: ____________________ Date: ________ Weight: ____________________ Date: ________ Weight: ____________________ Date: ________ Weight: ____________________ Date:  ________ Weight: ____________________ Date: ________ Weight: ____________________ Date: ________ Weight: ____________________ Date: ________ Weight: ____________________ Date: ________ Weight: ____________________ Date: ________ Weight: ____________________ Date: ________ Weight: ____________________ Date: ________ Weight: ____________________ Date: ________ Weight: ____________________ Date: ________ Weight: ____________________ Date: ________ Weight: ____________________ Date: ________ Weight: ____________________ Date: ________ Weight: ____________________ Date: ________ Weight: ____________________ Date: ________ Weight: ____________________ Date: ________ Weight: ____________________ Date: ________ Weight: ____________________ Date: ________ Weight: ____________________ Date: ________ Weight: ____________________ Date: ________ Weight: ____________________ Date: ________ Weight: ____________________ Date: ________ Weight: ____________________ Date: ________ Weight: ____________________ Date: ________ Weight: ____________________ Date: ________ Weight: ____________________ Date: ________  Weight: ____________________ Date: ________ Weight: ____________________ Date: ________ Weight: ____________________ Date: ________ Weight: ____________________ Date: ________ Weight: ____________________ Date: ________ Weight: ____________________ Date: ________ Weight: ____________________ Date: ________ Weight: ____________________ Date: ________ Weight: ____________________ Date: ________ Weight: ____________________ Date: ________ Weight: ____________________ Date: ________ Weight: ____________________ Date: ________ Weight: ____________________ Date: ________ Weight: ____________________ Date: ________ Weight: ____________________ Date: ________ Weight: ____________________ Date: ________ Weight: ____________________ Date: ________ Weight: ____________________ Date: ________ Weight:  ____________________ Date: ________ Weight: ____________________ Date: ________ Weight: ____________________ Date: ________ Weight: ____________________ Date: ________ Weight: ____________________ Date: ________ Weight: ____________________ Date: ________ Weight: ____________________ Date: ________ Weight: ____________________ Date: ________ Weight: ____________________ Date: ________ Weight: ____________________ Date: ________ Weight: ____________________ Date: ________ Weight: ____________________ Date: ________ Weight: ____________________ Date: ________ Weight: ____________________ Date: ________ Weight: ____________________ Date: ________ Weight: ____________________ Date: ________ Weight: ____________________ Date: ________ Weight: ____________________ Date: ________ Weight: ____________________ Date: ________ Weight: ____________________ Date: ________ Weight: ____________________ Date: ________ Weight: ____________________ Date: ________ Weight: ____________________ Date: ________ Weight: ____________________ Date: ________ Weight: ____________________ Date: ________ Weight: ____________________ Date: ________ Weight: ____________________ Date: ________ Weight: ____________________ Date: ________ Weight: ____________________ Date: ________ Weight: ____________________ Date: ________ Weight: ____________________ Date: ________ Weight: ____________________ Date: ________ Weight: ____________________ Date: ________ Weight: ____________________ Date: ________ Weight: ____________________ Date: ________ Weight: ____________________ Date: ________ Weight: ____________________ Date: ________ Weight: ____________________ Date: ________ Weight: ____________________ Date: ________ Weight: ____________________ Date: ________ Weight: ____________________ Date: ________ Weight: ____________________ Date: ________ Weight: ____________________ Date: ________ Weight: ____________________ Date: ________  Weight: ____________________ Date: ________ Weight: ____________________ Date: ________ Weight: ____________________ Date: ________ Weight: ____________________ Date: ________ Weight: ____________________ Date: ________ Weight: ____________________ Date: ________ Weight: ____________________ Date: ________ Weight: ____________________ Date: ________ Weight: ____________________ Date: ________ Weight: ____________________ Date: ________ Weight: ____________________ Date: ________ Weight: ____________________ Date: ________ Weight: ____________________ Date: ________ Weight: ____________________ Date: ________ Weight: ____________________ Date: ________ Weight: ____________________ Date: ________ Weight: ____________________ Date: ________ Weight: ____________________ Date: ________ Weight: ____________________ Date: ________ Weight: ____________________ Date: ________ Weight: ____________________ Date: ________ Weight: ____________________ Date: ________ Weight: ____________________ Date: ________ Weight: ____________________ Date: ________ Weight: ____________________ Date: ________ Weight: ____________________ Date: ________ Weight: ____________________ Date: ________ Weight: ____________________ Date: ________ Weight: ____________________ Date: ________ Weight: ____________________ Date: ________ Weight: ____________________ Date: ________ Weight: ____________________ Date: ________ Weight: ____________________ Date: ________ Weight: ____________________ Date: ________ Weight: ____________________ Date: ________ Weight: ____________________ Date: ________ Weight: ____________________ Date: ________ Weight: ____________________ Date: ________ Weight: ____________________ Date: ________ Weight: ____________________ Date: ________ Weight: ____________________ Date: ________ Weight: ____________________ Date: ________ Weight: ____________________ Date: ________ Weight: ____________________ Date:  ________ Weight: ____________________ Date: ________ Weight: ____________________ Date: ________ Weight: ____________________ Date: ________ Weight: ____________________ Date: ________ Weight: ____________________ Date: ________ Weight: ____________________ Date: ________ Weight: ____________________ Date: ________ Weight: ____________________ Date: ________ Weight: ____________________ Date: ________ Weight: ____________________ Date: ________ Weight: ____________________ Date: ________ Weight: ____________________ Date: ________ Weight: ____________________ Date: ________ Weight: ____________________ Date: ________ Weight: ____________________ Date: ________ Weight: ____________________ Date: ________ Weight: ____________________ Date: ________ Weight: ____________________ Date: ________ Weight: ____________________ Date: ________ Weight: ____________________ Date: ________ Weight: ____________________ Date: ________ Weight: ____________________ Date: ________ Weight: ____________________ Date: ________ Weight: ____________________ Date: ________ Weight: ____________________ Date: ________ Weight: ____________________ Date: ________ Weight: ____________________ Date: ________ Weight: ____________________ Date: ________ Weight: ____________________ Date: ________ Weight: ____________________ Date: ________ Weight: ____________________ Date: ________ Weight: ____________________ Date: ________ Weight: ____________________ Date: ________ Weight: ____________________ Date: ________ Weight: ____________________ Date: ________ Weight: ____________________ Date: ________ Weight: ____________________ Date: ________ Weight: ____________________ Date: ________ Weight: ____________________ Date: ________ Weight: ____________________ Date: ________ Weight: ____________________ Date: ________ Weight: ____________________ Date: ________ Weight: ____________________ Date: ________ Weight:  ____________________ Date: ________ Weight: ____________________ Date: ________ Weight: ____________________ Date: ________ Weight: ____________________ Date: ________ Weight: ____________________ Date: ________ Weight: ____________________ Date: ________ Weight: ____________________ Date: ________ Weight: ____________________ Date: ________ Weight: ____________________ Date: ________ Weight: ____________________ Date: ________ Weight: ____________________ Date: ________ Weight: ____________________ Date: ________ Weight: ____________________ Date: ________ Weight: ____________________ Date: ________ Weight: ____________________ Date: ________ Weight: ____________________ Date: ________ Weight: ____________________ Date: ________ Weight: ____________________ Date: ________ Weight: ____________________ Date: ________ Weight: ____________________ Date: ________ Weight: ____________________ Date: ________ Weight: ____________________ Date: ________ Weight: ____________________ Date: ________ Weight: ____________________ Date: ________ Weight: ____________________ Date: ________ Weight: ____________________ Date: ________ Weight: ____________________ Date: ________ Weight: ____________________ Date: ________ Weight: ____________________ Date: ________ Weight: ____________________ Date: ________ Weight: ____________________ Date: ________ Weight: ____________________ Date: ________ Weight: ____________________ Date: ________ Weight: ____________________ Date: ________ Weight: ____________________ Date: ________ Weight: ____________________ Date: ________ Weight: ____________________ Date: ________ Weight: ____________________ Date: ________ Weight: ____________________ Date: ________ Weight: ____________________ Date: ________ Weight: ____________________ Date: ________ Weight: ____________________ Date: ________ Weight: ____________________ Date: ________ Weight: ____________________ Date: ________  Weight: ____________________ Date: ________ Weight: ____________________ Date: ________ Weight: ____________________ Date: ________ Weight: ____________________ Date: ________ Weight: ____________________ Date: ________ Weight: ____________________ Date: ________ Weight: ____________________ Date: ________ Weight: ____________________ Date: ________ Weight: ____________________ Date: ________ Weight: ____________________ Date: ________ Weight: ____________________ Date: ________ Weight: ____________________ Date: ________ Weight: ____________________ Date: ________ Weight: ____________________ Date: ________ Weight: ____________________ Date: ________ Weight: ____________________ Date: ________ Weight: ____________________ Date: ________ Weight: ____________________ Date: ________ Weight: ____________________ Date: ________ Weight: ____________________ Date: ________ Weight: ____________________ Date: ________ Weight: ____________________ Date: ________ Weight: ____________________ Date: ________ Weight: ____________________ Date: ________ Weight: ____________________ Date: ________ Weight: ____________________ Date: ________ Weight: ____________________ Date: ________ Weight: ____________________ Date: ________ Weight: ____________________ Date: ________ Weight: ____________________ Date: ________ Weight: ____________________ Date: ________ Weight: ____________________ Date: ________ Weight: ____________________ Date: ________ Weight: ____________________ Date: ________ Weight: ____________________ Date: ________ Weight: ____________________ Date: ________ Weight: ____________________ Date: ________ Weight: ____________________ Date: ________ Weight: ____________________ Date: ________ Weight: ____________________ Date: ________ Weight: ____________________ Date: ________ Weight: ____________________ Date: ________ Weight: ____________________ Date: ________ Weight: ____________________ Date:  ________ Weight: ____________________ Date: ________ Weight: ____________________ Date: ________ Weight: ____________________ Date: ________ Weight: ____________________ Date: ________ Weight: ____________________ Date: ________ Weight: ____________________ Date: ________ Weight: ____________________ Date: ________ Weight: ____________________ Date: ________ Weight: ____________________ Date: ________ Weight: ____________________ Date: ________ Weight: ____________________ Date: ________ Weight: ____________________ Date: ________ Weight: ____________________ Date: ________ Weight: ____________________ Date: ________ Weight: ____________________ Date: ________ Weight: ____________________ Date: ________ Weight: ____________________ Date: ________ Weight: ____________________ Date: ________ Weight: ____________________ Date: ________ Weight: ____________________ Date: ________ Weight: ____________________ Date: ________ Weight: ____________________ Date: ________ Weight: ____________________ Date: ________ Weight: ____________________ Date: ________ Weight: ____________________ Date: ________ Weight: ____________________ Date: ________ Weight: ____________________ Date: ________ Weight: ____________________ Date: ________ Weight: ____________________ Date: ________ Weight: ____________________ Date: ________ Weight: ____________________ Date: ________ Weight: ____________________ Date: ________ Weight: ____________________ Date: ________ Weight: ____________________ Date: ________ Weight: ____________________ Date: ________ Weight: ____________________ Date: ________ Weight: ____________________ Date: ________ Weight: ____________________ Date: ________ Weight: ____________________ Date: ________ Weight: ____________________ Date: ________ Weight: ____________________ Date: ________ Weight: ____________________ Date: ________ Weight: ____________________ Date: ________ Weight:  ____________________ Date: ________ Weight: ____________________ Date: ________ Weight: ____________________ Date: ________ Weight: ____________________ Date: ________ Weight: ____________________ Date: ________ Weight: ____________________ Date: ________ Weight: ____________________ Date: ________ Weight: ____________________ Date: ________ Weight: ____________________ Date: ________ Weight: ____________________ Date: ________ Weight: ____________________ Date: ________ Weight: ____________________ Date: ________ Weight: ____________________ Date: ________ Weight: ____________________ Date: ________ Weight: ____________________ Date: ________ Weight: ____________________ Date: ________ Weight: ____________________ Date: ________ Weight: ____________________ Date: ________ Weight: ____________________  Date: ________ Weight: ____________________ Date: ________ Weight: ____________________ Date: ________ Weight: ____________________ Date: ________ Weight: ____________________ Date: ________ Weight: ____________________ Date: ________ Weight: ____________________ Date: ________ Weight: ____________________ Date: ________ Weight: ____________________ Date: ________ Weight: ____________________ Date: ________ Weight: ____________________ Date: ________ Weight: ____________________ Date: ________ Weight: ____________________ Date: ________ Weight: ____________________ Date: ________ Weight: ____________________ Date: ________ Weight: ____________________ Date: ________ Weight: ____________________ Date: ________ Weight: ____________________ Date: ________ Weight: ____________________ Date: ________ Weight: ____________________ Date: ________ Weight: ____________________ Date: ________ Weight: ____________________ Date: ________ Weight: ____________________ Date: ________ Weight: ____________________ Date: ________ Weight: ____________________ Date: ________ Weight: ____________________ Date: ________  Weight: ____________________ Date: ________ Weight: ____________________ Date: ________ Weight: ____________________ Date: ________ Weight: ____________________ Date: ________ Weight: ____________________ Date: ________ Weight: ____________________ Date: ________ Weight: ____________________ Date: ________ Weight: ____________________ Date: ________ Weight: ____________________ Date: ________ Weight: ____________________ Date: ________ Weight: ____________________ Date: ________ Weight: ____________________ Date: ________ Weight: ____________________ Date: ________ Weight: ____________________ Date: ________ Weight: ____________________ Date: ________ Weight: ____________________ Date: ________ Weight: ____________________ Date: ________ Weight: ____________________ Date: ________ Weight: ____________________ Date: ________ Weight: ____________________ Date: ________ Weight: ____________________ Date: ________ Weight: ____________________ Date: ________ Weight: ____________________ Date: ________ Weight: ____________________ Date: ________ Weight: ____________________ Date: ________ Weight: ____________________ Date: ________ Weight: ____________________ Date: ________ Weight: ____________________ Date: ________ Weight: ____________________ Date: ________ Weight: ____________________ Date: ________ Weight: ____________________ Date: ________ Weight: ____________________ Date: ________ Weight: ____________________ Date: ________ Weight: ____________________ Date: ________ Weight: ____________________ Date: ________ Weight: ____________________ Date: ________ Weight: ____________________ Date: ________ Weight: ____________________ Date: ________ Weight: ____________________ Date: ________ Weight: ____________________ Date: ________ Weight: ____________________ Date: ________ Weight: ____________________ Date: ________ Weight: ____________________ Date: ________ Weight: ____________________ Date:  ________ Weight: ____________________ Date: ________ Weight: ____________________ Date: ________ Weight: ____________________ Date: ________ Weight: ____________________ Date: ________ Weight: ____________________ Date: ________ Weight: ____________________ Date: ________ Weight: ____________________ Date: ________ Weight: ____________________ Date: ________ Weight: ____________________ Date: ________ Weight: ____________________ Date: ________ Weight: ____________________ Date: ________ Weight: ____________________ Date: ________ Weight: ____________________ Date: ________ Weight: ____________________ Date: ________ Weight: ____________________ Date: ________ Weight: ____________________ Date: ________ Weight: ____________________ Date: ________ Weight: ____________________ Date: ________ Weight: ____________________ Date: ________ Weight: ____________________ Date: ________ Weight: ____________________ Date: ________ Weight: ____________________ Date: ________ Weight: ____________________ Date: ________ Weight: ____________________ Date: ________ Weight: ____________________ Date: ________ Weight: ____________________ Date: ________ Weight: ____________________ Date: ________ Weight: ____________________ Date: ________ Weight: ____________________ Date: ________ Weight: ____________________ Date: ________ Weight: ____________________ Date: ________ Weight: ____________________ Date: ________ Weight: ____________________ Date: ________ Weight: ____________________ Date: ________ Weight: ____________________ Date: ________ Weight: ____________________ Date: ________ Weight: ____________________ Date: ________ Weight: ____________________ Date: ________ Weight: ____________________ Date: ________ Weight: ____________________ Date: ________ Weight: ____________________ Date: ________ Weight: ____________________ Date: ________ Weight: ____________________ Date: ________ Weight:  ____________________ Date: ________ Weight: ____________________ Date: ________ Weight: ____________________ Date: ________ Weight: ____________________ Date: ________ Weight: ____________________ Date: ________ Weight: ____________________ Date: ________ Weight: ____________________ Date: ________ Weight: ____________________ Date: ________ Weight: ____________________ Date: ________ Weight: ____________________ Date: ________ Weight: ____________________ Date: ________ Weight: ____________________ Date: ________ Weight: ____________________ Date: ________ Weight: ____________________ Date: ________ Weight: ____________________ Date: ________ Weight: ____________________ Date: ________ Weight: ____________________ Date: ________ Weight: ____________________ Date: ________ Weight: ____________________ Date: ________ Weight: ____________________ Date: ________ Weight: ____________________ Date: ________ Weight: ____________________ Date: ________ Weight: ____________________ Date: ________ Weight: ____________________ Date: ________ Weight: ____________________ Date: ________ Weight: ____________________ Date: ________ Weight: ____________________ Date: ________ Weight: ____________________ Date: ________ Weight: ____________________ Date: ________ Weight: ____________________ Date: ________ Weight: ____________________ Date: ________ Weight: ____________________ Date: ________ Weight: ____________________ Date: ________ Weight: ____________________ Date: ________ Weight: ____________________ Date: ________ Weight: ____________________ Date: ________ Weight: ____________________ Date:  ________ Weight: ____________________ Date: ________ Weight: ____________________ Date: ________ Weight: ____________________ Date: ________ Weight: ____________________ Date: ________ Weight: ____________________ Date: ________ Weight: ____________________ Date: ________ Weight: ____________________ Date: ________  Weight: ____________________ Date: ________ Weight: ____________________ Date: ________ Weight: ____________________ Date: ________ Weight: ____________________ Date: ________ Weight: ____________________ Date: ________ Weight: ____________________ Date: ________ Weight: ____________________ Date: ________ Weight: ____________________ Date: ________ Weight: ____________________ Date: ________ Weight: ____________________ Date: ________ Weight: ____________________ Date: ________ Weight: ____________________ Date: ________ Weight: ____________________ Date: ________ Weight: ____________________ Date: ________ Weight: ____________________ Date: ________ Weight: ____________________ Date: ________ Weight: ____________________ Date: ________ Weight: ____________________ Date: ________ Weight: ____________________ Date: ________ Weight: ____________________ Date: ________ Weight: ____________________ Date: ________ Weight: ____________________ Date: ________ Weight: ____________________ Date: ________ Weight: ____________________ Date: ________ Weight: ____________________ Date: ________ Weight: ____________________ Date: ________ Weight: ____________________ Date: ________ Weight: ____________________ Date: ________ Weight: ____________________ Date: ________ Weight: ____________________ Date: ________ Weight: ____________________ Date: ________ Weight: ____________________ Date: ________ Weight: ____________________ Date: ________ Weight: ____________________ Date: ________ Weight: ____________________ Date: ________ Weight: ____________________ Date: ________ Weight: ____________________ Date: ________ Weight: ____________________ Date: ________ Weight: ____________________ Date: ________ Weight: ____________________ Date: ________ Weight: ____________________ Date: ________ Weight: ____________________ Date: ________ Weight: ____________________ Date: ________ Weight: ____________________ Date:  ________ Weight: ____________________ Date: ________ Weight: ____________________ Date: ________ Weight: ____________________ Date: ________ Weight: ____________________ Date: ________ Weight: ____________________ Date: ________ Weight: ____________________ Date: ________ Weight: ____________________ Date: ________ Weight: ____________________ Date: ________ Weight: ____________________ Date: ________ Weight: ____________________ Date: ________ Weight: ____________________ Date: ________ Weight: ____________________ Date: ________ Weight: ____________________ Date: ________ Weight: ____________________ Date: ________ Weight: ____________________ Date: ________ Weight: ____________________ Date: ________ Weight: ____________________ Date: ________ Weight: ____________________ Date: ________ Weight: ____________________ Date: ________ Weight: ____________________ Date: ________ Weight: ____________________ Date: ________ Weight: ____________________ Date: ________ Weight: ____________________ Date: ________ Weight: ____________________ Date: ________ Weight: ____________________ Date: ________ Weight: ____________________ Date: ________ Weight: ____________________ Date: ________ Weight: ____________________ Date: ________ Weight: ____________________ Date: ________ Weight: ____________________ Date: ________ Weight: ____________________ Date: ________ Weight: ____________________ Date: ________ Weight: ____________________ Date: ________ Weight: ____________________ Date: ________ Weight: ____________________ Date: ________ Weight: ____________________ Date: ________ Weight: ____________________ Date: ________ Weight: ____________________ Date: ________ Weight: ____________________ Date: ________ Weight: ____________________ Date: ________ Weight: ____________________ Date: ________ Weight: ____________________ Date: ________ Weight: ____________________ Date: ________ Weight:  ____________________ Date: ________ Weight: ____________________ Date: ________ Weight: ____________________ Date: ________ Weight: ____________________ Date: ________ Weight: ____________________ Date: ________ Weight: ____________________ Date: ________ Weight: ____________________ Date: ________ Weight: ____________________ Date: ________ Weight: ____________________ Date: ________ Weight: ____________________ Date: ________ Weight: ____________________ Date: ________ Weight: ____________________ Date: ________ Weight: ____________________ Date: ________ Weight: ____________________ Date: ________ Weight: ____________________ Date: ________ Weight: ____________________ Date: ________ Weight: ____________________ Date: ________ Weight: ____________________ Date: ________ Weight: ____________________ Date: ________ Weight: ____________________ Date: ________ Weight: ____________________ Date: ________ Weight: ____________________ Date: ________ Weight: ____________________ Date: ________ Weight: ____________________ Date: ________ Weight: ____________________ Date: ________ Weight: ____________________ Date: ________ Weight: ____________________ Date: ________ Weight: ____________________ Date: ________ Weight: ____________________ Date: ________ Weight: ____________________ Date: ________ Weight: ____________________ Date: ________ Weight: ____________________ Date: ________ Weight: ____________________ Date: ________ Weight: ____________________ Date: ________ Weight: ____________________ Date: ________ Weight: ____________________ Date: ________ Weight: ____________________ Date: ________ Weight: ____________________ Date: ________ Weight: ____________________ Date: ________ Weight: ____________________ Date: ________ Weight: ____________________ Date: ________ Weight: ____________________ Date: ________ Weight: ____________________ Date: ________ Weight: ____________________ Date: ________  Weight: ____________________ Date: ________ Weight: ____________________ Date: ________ Weight: ____________________ Date: ________ Weight: ____________________ Date: ________ Weight: ____________________ Date: ________ Weight: ____________________ Date: ________ Weight: ____________________ Date: ________ Weight: ____________________ Date: ________ Weight: ____________________ Date: ________ Weight: ____________________ Date: ________ Weight: ____________________ Date:  ________ Weight: ____________________ Date: ________ Weight: ____________________ Date: ________ Weight: ____________________ Date: ________ Weight: ____________________ Date: ________ Weight: ____________________ Date: ________ Weight: ____________________ Date: ________ Weight: ____________________ Date: ________ Weight: ____________________ Date: ________ Weight: ____________________ Date: ________ Weight: ____________________ Date: ________ Weight: ____________________ Date: ________ Weight: ____________________ Date: ________ Weight: ____________________ Date: ________ Weight: ____________________ Date: ________ Weight: ____________________ Date: ________ Weight: ____________________ Date: ________ Weight: ____________________ Date: ________ Weight: ____________________ Date: ________ Weight: ____________________ Date: ________ Weight: ____________________ Date: ________ Weight: ____________________ Date: ________ Weight: ____________________ Date: ________ Weight: ____________________ Date: ________ Weight: ____________________ Date: ________ Weight: ____________________ Date: ________ Weight: ____________________ Date: ________ Weight: ____________________ Date: ________ Weight: ____________________ Date: ________ Weight: ____________________ Date: ________ Weight: ____________________ Date: ________ Weight: ____________________ Date: ________ Weight: ____________________ Date: ________ Weight: ____________________ Date:  ________ Weight: ____________________ Date: ________ Weight: ____________________ Date: ________ Weight: ____________________ Date: ________ Weight: ____________________ Date: ________ Weight: ____________________ Date: ________ Weight: ____________________ Date: ________ Weight: ____________________ Date: ________ Weight: ____________________ Date: ________ Weight: ____________________ Date: ________ Weight: ____________________ Date: ________ Weight: ____________________ Date: ________ Weight: ____________________ Date: ________ Weight: ____________________ Date: ________ Weight: ____________________ Date: ________ Weight: ____________________ Date: ________ Weight: ____________________ Date: ________ Weight: ____________________ Date: ________ Weight: ____________________ Date: ________ Weight: ____________________ Date: ________ Weight: ____________________ Date: ________ Weight: ____________________ Date: ________ Weight: ____________________ Date: ________ Weight: ____________________ Date: ________ Weight: ____________________ Date: ________ Weight: ____________________ Date: ________ Weight: ____________________ Date: ________ Weight: ____________________ Date: ________ Weight: ____________________ Date: ________ Weight: ____________________ Date: ________ Weight: ____________________ Date: ________ Weight: ____________________ Date: ________ Weight: ____________________ Date: ________ Weight: ____________________ Date: ________ Weight: ____________________ Date: ________ Weight: ____________________ Date: ________ Weight: ____________________ Date: ________ Weight: ____________________ Date: ________ Weight: ____________________ Date: ________ Weight: ____________________ Date: ________ Weight: ____________________ Date: ________ Weight: ____________________ Date: ________ Weight: ____________________ Date: ________ Weight: ____________________ Date: ________ Weight:  ____________________ Date: ________ Weight: ____________________ Date: ________ Weight: ____________________ Date: ________ Weight: ____________________ Date: ________ Weight: ____________________ Date: ________ Weight: ____________________ Date: ________ Weight: ____________________ Date: ________ Weight: ____________________ Date: ________ Weight: ____________________ Date: ________ Weight: ____________________ Date: ________ Weight: ____________________ Date: ________ Weight: ____________________ Date: ________ Weight: ____________________ Date: ________ Weight: ____________________ Date: ________ Weight: ____________________ Date: ________ Weight: ____________________ Date: ________ Weight: ____________________ Date: ________ Weight: ____________________ Date: ________ Weight: ____________________ Date: ________ Weight: ____________________ Date: ________ Weight: ____________________ Date: ________ Weight: ____________________ Date: ________ Weight: ____________________ Date: ________ Weight: ____________________ Date: ________ Weight: ____________________ Date: ________ Weight: ____________________ Date: ________ Weight: ____________________ Date: ________ Weight: ____________________ Date: ________ Weight: ____________________ Date: ________ Weight: ____________________ Date: ________ Weight: ____________________ Date: ________ Weight: ____________________ Date: ________ Weight: ____________________ Date: ________ Weight: ____________________ Date: ________ Weight: ____________________ Date: ________ Weight: ____________________ Date: ________ Weight: ____________________ Date: ________ Weight: ____________________ Date: ________ Weight: ____________________ Date: ________ Weight: ____________________ Date: ________ Weight: ____________________ Date: ________ Weight: ____________________ Date: ________ Weight: ____________________ Date: ________ Weight: ____________________ Date: ________  Weight: ____________________ This information is not intended to replace advice given to you by your health care provider. Make sure you discuss any questions you have with your health care provider. Document Released: 05/14/2006 Document Revised: 09/20/2015 Document Reviewed: 09/29/2013  2017 [employer]     Signed, Thurmon Fair, MD  02/14/2016 1:50 PM    [facility] [address] [place], [place], [place]  [address] Phone: [phone]; Fax: [phone]

## 2016-03-23 ENCOUNTER — Other Ambulatory Visit: Payer: Self-pay

## 2016-03-23 MED ORDER — ISOSORBIDE MONONITRATE ER 30 MG PO TB24: 30.0000 mg | ORAL_TABLET | Freq: Every morning | ORAL | 1 refills | Status: DC

## 2016-03-23 MED ORDER — AMLODIPINE BESYLATE 5 MG PO TABS: 5.0000 mg | ORAL_TABLET | Freq: Every morning | ORAL | 0 refills | Status: DC

## 2016-03-25 LAB — CUP PACEART INCLINIC DEVICE CHECK
Date Time Interrogation Session: 20180110112107
Implantable Lead Implant Date: 20060313
Implantable Lead Location: 753859
MDC IDC LEAD IMPLANT DT: 20060313
MDC IDC LEAD LOCATION: 753860
MDC IDC PG IMPLANT DT: 20060313
Pulse Gen Model: 5386
Pulse Gen Serial Number: 1457119

## 2016-03-31 ENCOUNTER — Telehealth: Payer: Self-pay | Admitting: Cardiovascular Disease

## 2016-03-31 MED ORDER — HYDROCHLOROTHIAZIDE 25 MG PO TABS: 12.5000 mg | ORAL_TABLET | Freq: Every morning | ORAL | 1 refills | Status: DC

## 2016-03-31 NOTE — Telephone Encounter (Signed)
New message   Pt verbalized that she wants Dr.Kelly to write her a new prescription that her PCP has written so she don't have to go out and risk getting exposed to the flu   Th name of the medication is Hydrochloride she said its a fluid pill

## 2016-03-31 NOTE — Telephone Encounter (Signed)
Spoke to patient - hctz initially written for by Dr. Tresa Endo. Was refilled by PCP last time.  She requested refill. Aware I have sent this to her local pharmacy for 3 month supply as requested. Instructed her to call if further needs.

## 2016-04-15 ENCOUNTER — Telehealth: Payer: Self-pay | Admitting: Pharmacist Clinician (PhC)/ Clinical Pharmacy Specialist

## 2016-04-15 NOTE — Telephone Encounter (Signed)
Patient past due for INR, she agreed to have done in the next week

## 2016-05-04 ENCOUNTER — Other Ambulatory Visit: Payer: Self-pay

## 2016-05-04 MED ORDER — RAMIPRIL 5 MG PO CAPS: 5.0000 mg | ORAL_CAPSULE | Freq: Every day | ORAL | 6 refills | Status: DC

## 2016-05-15 ENCOUNTER — Other Ambulatory Visit: Payer: Self-pay | Admitting: Pharmacist Clinician (PhC)/ Clinical Pharmacy Specialist

## 2016-05-15 MED ORDER — WARFARIN SODIUM 5 MG PO TABS: ORAL_TABLET | ORAL | 1 refills | Status: DC

## 2016-05-18 ENCOUNTER — Ambulatory Visit (INDEPENDENT_AMBULATORY_CARE_PROVIDER_SITE_OTHER): Payer: Medicare Other | Admitting: Pharmacist Clinician (PhC)/ Clinical Pharmacy Specialist

## 2016-05-18 ENCOUNTER — Ambulatory Visit (INDEPENDENT_AMBULATORY_CARE_PROVIDER_SITE_OTHER): Payer: Medicare Other | Admitting: Cardiovascular Disease

## 2016-05-18 ENCOUNTER — Encounter: Payer: Self-pay | Admitting: Cardiovascular Disease

## 2016-05-18 VITALS — BP 100/54 | HR 70 | Ht 61.0 in | Wt 156.0 lb

## 2016-05-18 DIAGNOSIS — I4891 Unspecified atrial fibrillation: Secondary | ICD-10-CM | POA: Diagnosis not present

## 2016-05-18 DIAGNOSIS — Z950 Presence of cardiac pacemaker: Secondary | ICD-10-CM

## 2016-05-18 DIAGNOSIS — Z7901 Long term (current) use of anticoagulants: Secondary | ICD-10-CM | POA: Diagnosis not present

## 2016-05-18 DIAGNOSIS — E049 Nontoxic goiter, unspecified: Secondary | ICD-10-CM | POA: Diagnosis not present

## 2016-05-18 DIAGNOSIS — I482 Chronic atrial fibrillation: Secondary | ICD-10-CM | POA: Diagnosis not present

## 2016-05-18 DIAGNOSIS — I4821 Permanent atrial fibrillation: Secondary | ICD-10-CM

## 2016-05-18 DIAGNOSIS — I428 Other cardiomyopathies: Secondary | ICD-10-CM | POA: Diagnosis not present

## 2016-05-18 DIAGNOSIS — I10 Essential (primary) hypertension: Secondary | ICD-10-CM | POA: Diagnosis not present

## 2016-05-18 LAB — POCT INR: INR: 2

## 2016-05-18 NOTE — Progress Notes (Signed)
Patient ID: Denise [name], female   DOB: 09/21/1923, 83 y.o.   MRN: [id number]    HPI: Denise [name] is a 83 y.o. female who presents to the office today for an 8 -month followup cardiology evaluation.   Denise [name] has a history of permanent atrial fibrillation and is status post AV node ablation by Dr. Mitzi Davenport and permanent pacemaker implantation in June 2006. She has a history of hypertensive cardiomyopathy. She has documented severe LA dilatation. On an echo in December 2012 ejection fraction was 45-50%. She had moderate to severe MR, moderate pulmonary hypertension a nodular sclerosis of the aortic valve the  She has a dual-chamber St. Jude identity permanent pacemaker. She is followed by Dr. Sallyanne Kuster for her pacemaker. Pacemaker interrogation revealed normal function, and no reprogramming changes were necessary.  She is pacemaker dependent and no escape rhythm was present.  When the pacemaker was taken down to 30 bpm.  She had excellent lead parameters and no high ventricular rates were recorded.  She was last evaluated by him on 07/09/2015.  In March 2014 she broke her right hip and her right arm and required surgery by Dr. Lorre Nick. After being hospitalized she went to a nursing facility .  2017.  She underwent an echo Doppler study which showed an EF of 45-50%.  A questionable region of possible septal and inferior hypokinesis.  There was moderately severe tricuspid regurgitation.  There is moderate bony hypertension with PA pressure 56 mm.  She last saw Dr. Sallyanne Kuster on February 14, 2016 for pacemaker follow-up.  He was felt to be 1.5-2 years of estimated generator longevity.  She was 97% ventricular pacing and did not have any high ventricular rates.  She is on Coumadin for anticoagulation and denies bleeding.  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  She apparently has not been very reliable with INR checks.  She denies recent chest pain. She denies  significant shortness of breath. At times she does note some mild leg swelling. She walks with a walker.  She denies presyncope or syncope or any awareness of increased heart rate.  She presents for evaluation.   Past Medical History:  Diagnosis Date  . CHF (congestive heart failure) ([place])   . Diabetes mellitus without complication ([place])   . Hypertension   . LVH (left ventricular hypertrophy)   . Mitral regurgitation    mod to severe  . Permanent atrial fibrillation ([place])   . Pulmonary hypertension    severe  . Thyroid disease   . Tricuspid regurgitation    severe    Past Surgical History:  Procedure Laterality Date  . ABDOMINAL HYSTERECTOMY    . CARDIOVERSION  5 /18/ 2000   unsuccessful  . CHOLECYSTECTOMY    . COMPRESSION HIP SCREW Right 06/06/2012   Procedure: Open Reduction Internal Fixation Right HIp;  Surgeon: Denise Huger, MD;  Location: [place];  Service: Orthopedics;  Laterality: Right;  . ESOPHAGOGASTRODUODENOSCOPY N/A 06/09/2015   Procedure: ESOPHAGOGASTRODUODENOSCOPY (EGD);  Surgeon: Wilford Corner, MD;  Location: [facility] ENDOSCOPY;  Service: Endoscopy;  Laterality: N/A;  . knee replacement     R  . NM MYOCAR PERF WALL MOTION  02/28/2009   No signficant ischemia  . PACEMAKER INSERTION  05/26/2004   [employer]  . THYROIDECTOMY    . TUBAL LIGATION    . US ECHOCARDIOGRAPHY  02/25/2011   mod to severe MR,LA severe dilated,mild annular ca+,mod. TR,mod PI,mod ca+ of the  aortic valve leaflet    Allergies  Allergen Reactions  . Iohexol Rash     Desc: VOMITING-VERIFIED ON 05-24-04 PRE-PROCEDURE/ ARS   . Shrimp [Shellfish Allergy] Nausea And Vomiting and Rash    Current Outpatient Prescriptions  Medication Sig Dispense Refill  . amLODipine (NORVASC) 5 MG tablet Take 1 tablet (5 mg total) by mouth every morning. 90 tablet 0  . atorvastatin (LIPITOR) 10 MG tablet Take 10 mg by mouth every evening.    . furosemide (LASIX) 40 MG tablet Take 40 mg by mouth.    .  hydrochlorothiazide (HYDRODIURIL) 25 MG tablet Take 0.5 tablets (12.5 mg total) by mouth every morning. (Patient taking differently: Take 25 mg by mouth every morning. ) 90 tablet 1  . isosorbide mononitrate (IMDUR) 30 MG 24 hr tablet Take 1 tablet (30 mg total) by mouth every morning. 90 tablet 1  . methimazole (TAPAZOLE) 10 MG tablet Take 5 mg by mouth every morning.     . metoprolol succinate (TOPROL-XL) 50 MG 24 hr tablet Take 1 tablet (50 mg total) by mouth daily. Take with or immediately following a meal. 30 tablet 0  . potassium chloride SA (K-DUR,KLOR-CON) 20 MEQ tablet Take 1-2 tablets (20-40 mEq total) by mouth daily as directed 60 tablet 11  . ramipril (ALTACE) 5 MG capsule Take 1 capsule (5 mg total) by mouth daily. 30 capsule 6  . warfarin (COUMADIN) 5 MG tablet Take 1 tablet daily as directed by coumadin clinic 30 tablet 1   No current facility-administered medications for this visit.     Socially she is widowed and has 4 children 4 grandchildren 4 great-grandchildren. There is no tobacco or alcohol use.  ROS General: Negative; No fevers, chills, or night sweats;  HEENT: Negative; No changes in vision or hearing, sinus congestion, difficulty swallowing Pulmonary: Negative; No cough, wheezing, shortness of breath, hemoptysis Cardiovascular: Negative; No chest pain, presyncope, syncope, palpitations Mild intermittent lower extremity edema GI: Negative; No nausea, vomiting, diarrhea, or abdominal pain GU: Negative; No dysuria, hematuria, or difficulty voiding Musculoskeletal: Negative; no myalgias, joint pain, or weakness Hematologic/Oncology: Negative; no easy bruising, bleeding Endocrine: Positive for diabetes mellitus and thyroid disease. Neuro: Negative; no changes in balance, headaches Skin: Negative; No rashes or skin lesions Psychiatric: Negative; No behavioral problems, depression Sleep: Negative; No snoring, daytime sleepiness, hypersomnolence, bruxism, restless legs,  hypnogognic hallucinations, no cataplexy Other comprehensive 14 point system review is negative.  PE BP (!) 100/54   Pulse 70   Ht '5\' 1"'$  (1.549 m)   Wt 156 lb (70.8 kg)   BMI 29.48 kg/m   Repeat blood pressure by me 120/70  Wt Readings from Last 3 Encounters:  05/18/16 156 lb (70.8 kg)  02/14/16 150 lb 6.4 oz (68.2 kg)  01/07/16 159 lb (72.1 kg)   General: Alert, oriented, no distress.  Skin: normal turgor, no rashes HEENT: Normocephalic, atraumatic. Pupils round and reactive; sclera anicteric;no lid lag,  Nose without nasal septal hypertrophy Mouth/Parynx benign; Mallinpatti scale 3 Neck: No JVD, palpable masses bilaterally which may represent goiter. Lungs: clear to ausculatation and percussion; no wheezing or rales Chest wall: No tenderness to palpation Heart: RRR, s1 s2 normal 2/6 systolic murmur; no S3 gallop.  No diastolic murmur rubs thrills or heaves. Abdomen: Moderate central adiposity. soft, nontender; no hepatosplenomehaly, BS+; abdominal aorta nontender and not dilated by palpation. Back: No CVA tenderness Pulses 2+ Extremities: Trace bilateral ankle edema. no clubbing cyanosis., Homan's sign negative  Neurologic: grossly nonfocal Psychological: Normal  affect and mood  ECG (independently read by me): Underlying atrial fibrillation with ventricular paced rhythm at 70 bpm.  On 100% paced  September 2017 ECG (independently read by me): V paced rhythm at 72 bpm.  One PVC.  January 2017 ECG (independently read by me):  V paced rhythm at 70 bpm with underlying atrial fibrillation.  August 2016 ECG (independently read by me): 100% ventricular paced rhythm at 70 bpm with underlying atrial fibrillation.  August 2015 ECG (independently read by me): Underlying atrial fibrillation with ventricular paced rhythm at 70 beats per minute with 100% capture.  LABS BMP Latest Ref Rng & Units 11/20/2015 06/14/2015 06/07/2015  Glucose 65 - 99 mg/dL 106(H) 151(H) 107(H)  BUN 7 - 25  mg/dL 22 31(H) 28(H)  Creatinine 0.60 - 0.88 mg/dL 1.04(H) 1.20(H) 1.19(H)  Sodium 135 - 146 mmol/L 138 141 138  Potassium 3.5 - 5.3 mmol/L 3.5 3.5 3.6  Chloride 98 - 110 mmol/L 99 99(L) 101  CO2 20 - 31 mmol/L 28 - 27  Calcium 8.6 - 10.4 mg/dL 9.4 - 9.2   Hepatic Function Latest Ref Rng & Units 11/20/2015 06/07/2015 06/05/2012  Total Protein 6.1 - 8.1 g/dL 7.2 7.7 -  Albumin 3.6 - 5.1 g/dL 4.3 4.3 3.1(L)  AST 10 - 35 U/L 38(H) 25 -  ALT 6 - 29 U/L 33(H) 21 -  Alk Phosphatase 33 - 130 U/L 108 82 -  Total Bilirubin 0.2 - 1.2 mg/dL 1.3(H) 1.7(H) -   CBC Latest Ref Rng & Units 11/20/2015 06/14/2015 06/10/2015  WBC 3.8 - 10.8 K/uL 7.0 - 4.7  Hemoglobin 11.7 - 15.5 g/dL 13.8 15.0 11.7(L)  Hematocrit 35.0 - 45.0 % 42.5 44.0 37.9  Platelets 140 - 400 K/uL 158 - 138(L)   Lab Results  Component Value Date   MCV 85.9 11/20/2015   MCV 87.3 06/10/2015   MCV 87.3 06/09/2015   Lab Results  Component Value Date   TSH 5.806 (H) 06/07/2015   Lab Results  Component Value Date   HGBA1C 6.5 (H) 06/07/2015    Lipid Panel  No results found for: CHOL, TRIG, HDL, CHOLHDL, VLDL, LDLCALC, LDLDIRECT   INR today checked in office: 2.0  RADIOLOGY: No results found.  IMPRESSION:  1. Long term current use of anticoagulant therapy   2. Permanent atrial fibrillation ([place])   3. Essential hypertension   4. Pacemaker   5. Goiter   6. Nonischemic cardiomyopathy [facility])     ASSESSMENT AND PLAN: Ms. Wegener is an 83 year old female who has a history of permanent atrial fibrillation and is status post AV node ablation and implantation of a St. Jude identity dual-chamber pacemaker programmed to VVIR mode in light of her atrial fibrillation.  She is 100% paced.  Her last pacemaker interrogation showed normal function and no reprogramming was necessary. Her ECG today shows V paced rhythm at 70 bpm, with probable underlying atrial fibrillation.  She was  hospitalized in March 2017 and was  treated with IV Lasix but  I do not have the specifics of her hospital records, but I suspect she may of had volume overload.  Echo Doppler study showed an EF of 45-50%.  She has normal pacemaker function and I reviewed her last office visit by Dr. Sallyanne Kuster.  Her blood pressure today is stable.  Repeat by me was 110/60 on her regimen of Ramipril 5 mg, 5, Toprol-XL 50 mg, furosemide 40 mg daily in addition to HCTZ 12.5 mg.  She is on Coumadin anticoagulation  and has a Cha2ds2Vasc score of 5 based on 2 points for age, 1 point for gender, heart failure, and hypertension. Her INR today is therapeutic.  Since there has been inconsistency in her INR checks, she will have this checked at least 6 week intervals and will be alternating seen Dr. Sallyanne Kuster every 6 months and me every 6 months such that these can be checked at 3 months intervals as we stagger her appointments.   She is on Tapazole and now takes this every other day and is followed by Dr. Elyse Hsu.  She does have bilateral goiters.   She is on atorvastatin for hyperlipidemia.  She has a follow-up appointment to see Dr. Sallyanne Kuster in 3 months for pacemaker interrogation.  I will see her in 6 months for cardiology reevaluation.  Time spent:  25 minutes  Troy Sine, MD, [facility]  05/20/2016 8:13 AM

## 2016-05-18 NOTE — Patient Instructions (Signed)
Your physician wants you to follow-up in: 6 months or sooner if needed. You will receive a reminder letter in the mail two months in advance. If you don't receive a letter, please call our office to schedule the follow-up appointment.   If you need a refill on your cardiac medications before your next appointment, please call your pharmacy. 

## 2016-05-20 ENCOUNTER — Encounter: Payer: Self-pay | Admitting: Cardiovascular Disease

## 2016-06-15 ENCOUNTER — Other Ambulatory Visit: Payer: Self-pay

## 2016-06-15 MED ORDER — AMLODIPINE BESYLATE 5 MG PO TABS
5.0000 mg | ORAL_TABLET | Freq: Every morning | ORAL | 3 refills | Status: DC
Start: 2016-06-15 — End: 2017-03-13

## 2016-07-06 ENCOUNTER — Ambulatory Visit: Payer: Medicare Other | Admitting: Cardiology

## 2016-07-13 ENCOUNTER — Ambulatory Visit: Payer: Medicare Other | Admitting: Cardiovascular Disease

## 2016-07-30 ENCOUNTER — Telehealth: Payer: Self-pay | Admitting: Cardiovascular Disease

## 2016-07-30 ENCOUNTER — Other Ambulatory Visit: Payer: Self-pay

## 2016-07-30 MED ORDER — METOPROLOL SUCCINATE ER 50 MG PO TB24: 50.0000 mg | ORAL_TABLET | Freq: Every day | ORAL | 11 refills | Status: DC

## 2016-07-30 MED ORDER — METOPROLOL SUCCINATE ER 50 MG PO TB24: 50.0000 mg | ORAL_TABLET | Freq: Every day | ORAL | 3 refills | Status: DC

## 2016-07-30 NOTE — Telephone Encounter (Signed)
New message        *STAT* If patient is at the pharmacy, call can be transferred to refill team.   1. Which medications need to be refilled? (please list name of each medication and dose if known)  Metoprolol 50mg  2. Which pharmacy/location (including street and city if local pharmacy) is medication to be sent to? Fax [phone]-----[facility] 3. Do they need a 30 day or 90 day supply? 90 day

## 2016-08-12 ENCOUNTER — Encounter: Payer: Medicare Other | Admitting: Cardiovascular Disease

## 2016-08-13 ENCOUNTER — Encounter: Payer: Self-pay | Admitting: Cardiovascular Disease

## 2016-08-13 ENCOUNTER — Ambulatory Visit (INDEPENDENT_AMBULATORY_CARE_PROVIDER_SITE_OTHER): Payer: Medicare Other | Admitting: Cardiovascular Disease

## 2016-08-13 VITALS — BP 122/68 | HR 70 | Ht 61.0 in | Wt 147.6 lb

## 2016-08-13 DIAGNOSIS — Z950 Presence of cardiac pacemaker: Secondary | ICD-10-CM

## 2016-08-13 DIAGNOSIS — I482 Chronic atrial fibrillation: Secondary | ICD-10-CM | POA: Diagnosis not present

## 2016-08-13 DIAGNOSIS — I5042 Chronic combined systolic (congestive) and diastolic (congestive) heart failure: Secondary | ICD-10-CM | POA: Diagnosis not present

## 2016-08-13 DIAGNOSIS — Z7901 Long term (current) use of anticoagulants: Secondary | ICD-10-CM | POA: Diagnosis not present

## 2016-08-13 DIAGNOSIS — I10 Essential (primary) hypertension: Secondary | ICD-10-CM | POA: Diagnosis not present

## 2016-08-13 DIAGNOSIS — I428 Other cardiomyopathies: Secondary | ICD-10-CM

## 2016-08-13 DIAGNOSIS — I4821 Permanent atrial fibrillation: Secondary | ICD-10-CM

## 2016-08-13 NOTE — Patient Instructions (Signed)
Dr Croitoru recommends that you schedule a follow-up appointment in 3 months with a pacemaker check.  If you need a refill on your cardiac medications before your next appointment, please call your pharmacy. 

## 2016-08-13 NOTE — Progress Notes (Signed)
Patient ID: Denise [name], female   DOB: May 19, 1923, 83 y.o.   MRN: [id number]    Cardiology Office Note    Date:  08/15/2016   ID:  Denise [name], DOB [birth year], MRN [id number]  PCP:  Daryl Eastern, MD  Cardiologist:  Nicki Guadalajara, MD;  Thurmon Fair, MD   No chief complaint on file.   History of Present Illness:  Denise [name] is a 83 y.o. female with complete heart block after AV node ablation for difficult to control atrial fibrillation, Chronic combined systolic diastolic heart failure who presents in follow-up.  She has a dual-chamber St. Jude identity device that was implanted in 2006 but the device is programmed VVIR since she is now in permanent atrial fibrillation. Her device is not amenable for remote monitoring.  Pacemaker interrogation today shows that her St. Jude identity ADX XL DR device still has 0.75-1 year of estimated longevity. She has 96% ventricular pacing. She has an underlying escape rhythm at 34 bpm. She has not had any recorded ventricular tachycardia. Lead sensing, impedance and threshold all remain excellent.  Edema shortness of breath are well compensated on current dose of diuretic. Her weight is stable at 145-147 pounds. She is extremely sedentary. She is taking ace inhibitors.  She is on chronic warfarin anticoagulation and has not had any bleeding problems or any focal neurological events.  Echocardiogram March 2017 showed an ejection fraction of 45-50%, mild mitral regurgitation, moderate to severe tricuspid regurgitation with an estimated systolic PA pressure 56 mmHg. Her echo in 2012 had described moderate to severe mitral regurgitation   Past Medical History:  Diagnosis Date  . CHF (congestive heart failure) ([facility])   . Diabetes mellitus without complication ([facility])   . Hypertension   . LVH (left ventricular hypertrophy)   . Mitral regurgitation    mod to severe  . Permanent atrial fibrillation ([facility])   . Pulmonary hypertension  ([facility])    severe  . Thyroid disease   . Tricuspid regurgitation    severe    Past Surgical History:  Procedure Laterality Date  . ABDOMINAL HYSTERECTOMY    . CARDIOVERSION  5 /18/ 2000   unsuccessful  . CHOLECYSTECTOMY    . COMPRESSION HIP SCREW Right 06/06/2012   Procedure: Open Reduction Internal Fixation Right HIp;  Surgeon: Dannielle Huh, MD;  [facility];  Service: Orthopedics;  Laterality: Right;  . ESOPHAGOGASTRODUODENOSCOPY N/A 06/09/2015   Procedure: ESOPHAGOGASTRODUODENOSCOPY (EGD);  Surgeon: Charlott Rakes, MD;  Location: [facility] ENDOSCOPY;  Service: Endoscopy;  Laterality: N/A;  . knee replacement     R  . NM MYOCAR PERF WALL MOTION  02/28/2009   No signficant ischemia  . PACEMAKER INSERTION  05/26/2004   [facility]  . THYROIDECTOMY    . TUBAL LIGATION    . US ECHOCARDIOGRAPHY  02/25/2011   mod to severe MR,LA severe dilated,mild annular ca+,mod. TR,mod PI,mod ca+ of the aortic valve leaflet    Current Medications: Outpatient Medications Prior to Visit  Medication Sig Dispense Refill  . amLODipine (NORVASC) 5 MG tablet Take 1 tablet (5 mg total) by mouth every morning. 90 tablet 3  . atorvastatin (LIPITOR) 10 MG tablet Take 10 mg by mouth every evening.    . furosemide (LASIX) 40 MG tablet Take 40 mg by mouth every other day.     . hydrochlorothiazide (HYDRODIURIL) 25 MG tablet Take 0.5 tablets (12.5 mg total) by mouth every morning. (Patient taking differently: Take 25 mg by mouth every morning. )  90 tablet 1  . isosorbide mononitrate (IMDUR) 30 MG 24 hr tablet Take 1 tablet (30 mg total) by mouth every morning. 90 tablet 1  . methimazole (TAPAZOLE) 10 MG tablet Take 5 mg by mouth every morning.     . metoprolol succinate (TOPROL-XL) 50 MG 24 hr tablet Take 1 tablet (50 mg total) by mouth daily. Take with or immediately following a meal. 90 tablet 3  . potassium chloride SA (K-DUR,KLOR-CON) 20 MEQ tablet Take 1-2 tablets (20-40 mEq total) by mouth daily as directed 60  tablet 11  . ramipril (ALTACE) 5 MG capsule Take 1 capsule (5 mg total) by mouth daily. 30 capsule 6  . warfarin (COUMADIN) 5 MG tablet Take 1 tablet daily as directed by coumadin clinic 30 tablet 1   No facility-administered medications prior to visit.      Allergies:   Iohexol and Shrimp [shellfish allergy]   Social History   Social History  . Marital status: Widowed    Spouse name: N/A  . Number of children: N/A  . Years of education: N/A   Social History Main Topics  . Smoking status: Never Smoker  . Smokeless tobacco: Never Used  . Alcohol use No  . Drug use: No  . Sexual activity: No   Other Topics Concern  . None   Social History Narrative  . None     Family History:  The patient's family history includes Asthma in her mother; Cancer - Colon in her brother; Gallbladder disease in her maternal grandmother; Heart attack in her brother and father; Heart disease in her child; Stroke in her maternal grandfather and sister.   ROS:   Please see the history of present illness.    ROS All other systems reviewed and are negative.   PHYSICAL EXAM:   VS:  BP 122/68   Pulse 70   Ht 5\' 1"  (1.549 m)   Wt 147 lb 9.6 oz (67 kg)   SpO2 96%   BMI 27.89 kg/m    GEN: Well nourished, well developed, in no acute distress   General: Alert, oriented x3, no distress Head: no evidence of trauma, PERRL, EOMI, no exophtalmos or lid lag, no myxedema, no xanthelasma; normal ears, nose and oropharynx Neck: normal jugular venous pulsations and no hepatojugular reflux; brisk carotid pulses without delay and no carotid bruits Chest: clear to auscultation, no signs of consolidation by percussion or palpation, normal fremitus, symmetrical and full respiratory excursions Cardiovascular: normal position and quality of the apical impulse, regular rhythm, normal first and paradoxically split second heart sounds, 2/6 holosystolic parasternal murmur, no diastolic murmurs, rubs or gallops.  Abdomen:  no tenderness or distention, no masses by palpation, no abnormal pulsatility or arterial bruits, normal bowel sounds, no hepatosplenomegaly Extremities: no clubbing, cyanosis or edema; 2+ radial, ulnar and brachial pulses bilaterally; 2+ right femoral, posterior tibial and dorsalis pedis pulses; 2+ left femoral, posterior tibial and dorsalis pedis pulses; no subclavian or femoral bruits Neurological: grossly nonfocal Psych: euthymic mood, full affect  Wt Readings from Last 3 Encounters:  08/13/16 147 lb 9.6 oz (67 kg)  05/18/16 156 lb (70.8 kg)  02/14/16 150 lb 6.4 oz (68.2 kg)      Studies/Labs Reviewed:   EKG:  EKG is not ordered today.    Recent Labs: 11/20/2015: ALT 33; BUN 22; Creat 1.04; Hemoglobin 13.8; Platelets 158; Potassium 3.5; Sodium 138    ASSESSMENT:    1. Chronic combined systolic and diastolic heart failure (H[facility])  2. Permanent atrial fibrillation ([facility])   3. Long term current use of anticoagulant therapy   4. Nonischemic cardiomyopathy ([facility])   5. Essential hypertension   6. Pacemaker approaching ERI      PLAN:  In order of problems listed above:  1. CHF: Well compensated heart failure on a low dose of diuretic. Reminded her about sodium restriction and daily weights. She is on ACE inhibitor and beta blocker. EF is only mildly depressed. She has pacing-induced dyssynchrony, but does not appear to need CRT. 2. Permanent atrial fibrillation s/p AV node ablation. CHADSVasc 5 (age 83, gender, HF, HTN). On anticoagulation. 3. Warfarin: No bleeding complications recently. In the past excessive anticoagulation during treatment with oral steroids led to severe GI bleeding. 4. CHB: She does have a reasonably reliable escape rhythm, but should be considered Pacemaker dependent following AV node ablation 5. HTN: Excellent control 6. Pacemaker: Needs to have her device reliably checked every 3 months. She has a follow-up appointment with Dr. Tresa Endo in 3 months and hopefully we  can get it done then. If this doesn't happen, we'll make sure she has a backup appointment with me. If the device is checked in 3 months, I will just see her in 6 months. Anticipate need for pacemaker change out sometime next spring.   Medication Adjustments/Labs and Tests Ordered: Current medicines are reviewed at length with the patient today.  Concerns regarding medicines are outlined above.  Medication changes, Labs and Tests ordered today are listed in the Patient Instructions below. Patient Instructions  Dr Royann Shivers recommends that you schedule a follow-up appointment in 3 months with a pacemaker check.  If you need a refill on your cardiac medications before your next appointment, please call your pharmacy.    Signed, Thurmon Fair, MD  08/15/2016 6:43 PM    [facility] [address] [place], [place], [place]  [address] Phone: [phone]; Fax: [phone]

## 2016-08-17 NOTE — Progress Notes (Signed)
Staff message sent to [facility] to call this patient to schedule September appointment.

## 2016-08-19 LAB — PROTIME-INR: INR: 2 — AB (ref 0.9–1.1)

## 2016-08-20 ENCOUNTER — Ambulatory Visit (INDEPENDENT_AMBULATORY_CARE_PROVIDER_SITE_OTHER): Payer: Medicare Other | Admitting: Pharmacist Clinician (PhC)/ Clinical Pharmacy Specialist

## 2016-08-20 DIAGNOSIS — I482 Chronic atrial fibrillation: Secondary | ICD-10-CM

## 2016-08-20 DIAGNOSIS — I4821 Permanent atrial fibrillation: Secondary | ICD-10-CM

## 2016-08-20 DIAGNOSIS — Z7901 Long term (current) use of anticoagulants: Secondary | ICD-10-CM

## 2016-10-13 LAB — PROTIME-INR: INR: 2.2 — AB (ref 0.9–1.1)

## 2016-10-16 ENCOUNTER — Ambulatory Visit (INDEPENDENT_AMBULATORY_CARE_PROVIDER_SITE_OTHER): Payer: Self-pay | Admitting: Pharmacist Clinician (PhC)/ Clinical Pharmacy Specialist

## 2016-10-16 DIAGNOSIS — I4821 Permanent atrial fibrillation: Secondary | ICD-10-CM

## 2016-10-16 DIAGNOSIS — I482 Chronic atrial fibrillation: Secondary | ICD-10-CM

## 2016-10-16 DIAGNOSIS — Z7901 Long term (current) use of anticoagulants: Secondary | ICD-10-CM

## 2016-11-30 ENCOUNTER — Other Ambulatory Visit: Payer: Self-pay | Admitting: *Deleted

## 2016-11-30 ENCOUNTER — Other Ambulatory Visit: Payer: Self-pay | Admitting: Cardiovascular Disease

## 2016-11-30 MED ORDER — RAMIPRIL 5 MG PO CAPS: 5.0000 mg | ORAL_CAPSULE | Freq: Every day | ORAL | 6 refills | Status: DC

## 2016-11-30 NOTE — Telephone Encounter (Signed)
Rx has been sent to the pharmacy electronically. ° °

## 2016-12-02 ENCOUNTER — Ambulatory Visit (INDEPENDENT_AMBULATORY_CARE_PROVIDER_SITE_OTHER): Payer: Medicare Other | Admitting: Cardiovascular Disease

## 2016-12-02 ENCOUNTER — Encounter: Payer: Self-pay | Admitting: Cardiovascular Disease

## 2016-12-02 ENCOUNTER — Ambulatory Visit (INDEPENDENT_AMBULATORY_CARE_PROVIDER_SITE_OTHER): Payer: Medicare Other | Admitting: Pharmacist

## 2016-12-02 ENCOUNTER — Encounter: Payer: Medicare Other | Admitting: Cardiovascular Disease

## 2016-12-02 VITALS — BP 132/68 | HR 70 | Ht 61.0 in | Wt 157.0 lb

## 2016-12-02 VITALS — BP 122/56 | HR 72 | Ht 61.0 in | Wt 157.0 lb

## 2016-12-02 DIAGNOSIS — I442 Atrioventricular block, complete: Secondary | ICD-10-CM | POA: Diagnosis not present

## 2016-12-02 DIAGNOSIS — I10 Essential (primary) hypertension: Secondary | ICD-10-CM

## 2016-12-02 DIAGNOSIS — E119 Type 2 diabetes mellitus without complications: Secondary | ICD-10-CM

## 2016-12-02 DIAGNOSIS — Z7901 Long term (current) use of anticoagulants: Secondary | ICD-10-CM

## 2016-12-02 DIAGNOSIS — I5042 Chronic combined systolic (congestive) and diastolic (congestive) heart failure: Secondary | ICD-10-CM | POA: Diagnosis not present

## 2016-12-02 DIAGNOSIS — Z79899 Other long term (current) drug therapy: Secondary | ICD-10-CM | POA: Diagnosis not present

## 2016-12-02 DIAGNOSIS — I4821 Permanent atrial fibrillation: Secondary | ICD-10-CM

## 2016-12-02 DIAGNOSIS — Z950 Presence of cardiac pacemaker: Secondary | ICD-10-CM

## 2016-12-02 DIAGNOSIS — I482 Chronic atrial fibrillation: Secondary | ICD-10-CM | POA: Diagnosis not present

## 2016-12-02 DIAGNOSIS — E785 Hyperlipidemia, unspecified: Secondary | ICD-10-CM

## 2016-12-02 DIAGNOSIS — I4891 Unspecified atrial fibrillation: Secondary | ICD-10-CM | POA: Diagnosis not present

## 2016-12-02 LAB — CUP PACEART INCLINIC DEVICE CHECK
Battery Voltage: 2.65 V
Brady Statistic RV Percent Paced: 96 %
Date Time Interrogation Session: 20180919133818
Implantable Lead Location: 753859
Implantable Lead Location: 753860
Lead Channel Impedance Value: 448 Ohm
Lead Channel Pacing Threshold Amplitude: 0.5 V
Lead Channel Setting Sensing Sensitivity: 4 mV
MDC IDC LEAD IMPLANT DT: 20060313
MDC IDC LEAD IMPLANT DT: 20060313
MDC IDC MSMT BATTERY IMPEDANCE: 18900 Ohm
MDC IDC MSMT BATTERY REMAINING LONGEVITY: 6 mo
MDC IDC MSMT LEADCHNL RV PACING THRESHOLD PULSEWIDTH: 0.5 ms
MDC IDC PG IMPLANT DT: 20060313
MDC IDC PG SERIAL: 1457119
MDC IDC SET LEADCHNL RV PACING PULSEWIDTH: 0.5 ms
Pulse Gen Model: 5386

## 2016-12-02 LAB — POCT INR: INR: 2.7

## 2016-12-02 NOTE — Patient Instructions (Signed)
Medication Instructions:  Your physician recommends that you continue on your current medications as directed. Please refer to the Current Medication list given to you today.  Labwork: Please return at your convienence for FASTING lab work (CBC, CMET, Lipid, TSH, HmgA1C).  This can be done at our in office lab (M-F 8:30-4:30 closed for lunch 12:30-2, no appointment needed).  Testing/Procedures: Your physician has requested that you have an echocardiogram in FEBRUARY 2019. Echocardiography is a painless test that uses sound waves to create images of your heart. It provides your doctor with information about the size and shape of your heart and how well your heart's chambers and valves are working. This procedure takes approximately one hour. There are no restrictions for this procedure. -this will be done at our [address] office: [address]  Follow-Up: Your physician wants you to follow-up in: 6 MONTHS with Dr. Tresa Endo (after echo). You will receive a reminder letter in the mail two months in advance. If you don't receive a letter, please call our office to schedule the follow-up appointment.   Any Other Special Instructions Will Be Listed Below (If Applicable).     If you need a refill on your cardiac medications before your next appointment, please call your pharmacy.

## 2016-12-02 NOTE — Progress Notes (Signed)
Patient ID: Denise [name], female   DOB: 1923-08-17, 83 y.o.   MRN: [id number]    HPI: Denise [name] is a 83 y.o. female who presents to the office today for an 8 -month followup cardiology evaluation.   Ms. Overall has a history of permanent atrial fibrillation and is status post AV node ablation by Dr. Mitzi Davenport and permanent pacemaker implantation in June 2006. She has a history of hypertensive cardiomyopathy. She has documented severe LA dilatation. On an echo in December 2012 ejection fraction was 45-50%. She had moderate to severe MR, moderate pulmonary hypertension a nodular sclerosis of the aortic valve the  She has a dual-chamber St. Jude identity permanent pacemaker. She is followed by Dr. Sallyanne Kuster for her pacemaker. Pacemaker interrogation revealed normal function, and no reprogramming changes were necessary.  She is pacemaker dependent and no escape rhythm was present.  When the pacemaker was taken down to 30 bpm.  She had excellent lead parameters and no high ventricular rates were recorded.  She was last evaluated by him on 07/09/2015.  In March 2014 she broke her right hip and her right arm and required surgery by Dr. Lorre Nick. After being hospitalized she went to a nursing facility .  In 2017 an echo Doppler study showed an EF of 45-50% with a questionable region of possible septal and inferior hypokinesis.  There was moderately severe tricuspid regurgitation.  There is moderate bony hypertension with PA pressure 56 mm.  She saw Dr. Sallyanne Kuster earlier today in follow-up of her pacemaker.  She is programmed in VVIR since she is now on permanent atrial fibrillation.  There was 96% ventricular pacing with an underlying escape rhythm at 34 bpm.  Her St. Jude identity pacemaker has only 6 months of estimated longevity.  Presently, she denies any episodes of chest pain or shortness of breath.  She is on Coumadin for anticoagulation and denies bleeding.  There is a history of Hollenhorst plaque in  the past and she is on statin therapy.  She presents for evaluation   Past Medical History:  Diagnosis Date  . CHF (congestive heart failure) ([place])   . Diabetes mellitus without complication ([place])   . Hypertension   . LVH (left ventricular hypertrophy)   . Mitral regurgitation    mod to severe  . Permanent atrial fibrillation ([place])   . Pulmonary hypertension (HCC)    severe  . Thyroid disease   . Tricuspid regurgitation    severe    Past Surgical History:  Procedure Laterality Date  . ABDOMINAL HYSTERECTOMY    . CARDIOVERSION  5 /18/ 2000   unsuccessful  . CHOLECYSTECTOMY    . COMPRESSION HIP SCREW Right 06/06/2012   Procedure: Open Reduction Internal Fixation Right HIp;  Surgeon: Vickey Huger, MD;  Location: [facility];  Service: Orthopedics;  Laterality: Right;  . ESOPHAGOGASTRODUODENOSCOPY N/A 06/09/2015   Procedure: ESOPHAGOGASTRODUODENOSCOPY (EGD);  Surgeon: Wilford Corner, MD;  Location: [facility] ENDOSCOPY;  Service: Endoscopy;  Laterality: N/A;  . knee replacement     R  . NM MYOCAR PERF WALL MOTION  02/28/2009   No signficant ischemia  . PACEMAKER INSERTION  05/26/2004   St.Jude  . THYROIDECTOMY    . TUBAL LIGATION    . US ECHOCARDIOGRAPHY  02/25/2011   mod to severe MR,LA severe dilated,mild annular ca+,mod. TR,mod PI,mod ca+ of the aortic valve leaflet    Allergies  Allergen Reactions  . Iohexol Rash     Desc: VOMITING-VERIFIED ON 05-24-04 PRE-PROCEDURE/ ARS   .  Shrimp [Shellfish Allergy] Nausea And Vomiting and Rash    Current Outpatient Prescriptions  Medication Sig Dispense Refill  . amLODipine (NORVASC) 5 MG tablet Take 1 tablet (5 mg total) by mouth every morning. 90 tablet 3  . atorvastatin (LIPITOR) 10 MG tablet Take 10 mg by mouth every evening.    . furosemide (LASIX) 40 MG tablet Take by mouth. On Monday Wednesday and Friday    . hydrochlorothiazide (HYDRODIURIL) 25 MG tablet Take 0.5 tablets (12.5 mg total) by mouth every morning. (Patient taking  differently: Take 25 mg by mouth every morning. ) 90 tablet 1  . isosorbide mononitrate (IMDUR) 30 MG 24 hr tablet Take 1 tablet (30 mg total) by mouth every morning. 90 tablet 1  . methimazole (TAPAZOLE) 5 MG tablet Take '5mg'$  on MWF Sun and '10mg'$  on T, Th, Saturday    . metoprolol succinate (TOPROL-XL) 50 MG 24 hr tablet Take 1 tablet (50 mg total) by mouth daily. Take with or immediately following a meal. 90 tablet 3  . potassium chloride SA (K-DUR,KLOR-CON) 20 MEQ tablet Take 1-2 tablets (20-40 mEq total) by mouth daily as directed 60 tablet 11  . ramipril (ALTACE) 5 MG capsule Take 1 capsule (5 mg total) by mouth daily. 30 capsule 6  . warfarin (COUMADIN) 5 MG tablet TAKE 1 TABLET BY MOUTH DAILY AS DIRECTED BY COUMADIN CLINIC 95 tablet 0   No current facility-administered medications for this visit.     Socially she is widowed and has 4 children 4 grandchildren 4 great-grandchildren. There is no tobacco or alcohol use.  ROS General: Negative; No fevers, chills, or night sweats;  HEENT: Negative; No changes in vision or hearing, sinus congestion, difficulty swallowing Pulmonary: Negative; No cough, wheezing, shortness of breath, hemoptysis Cardiovascular: Negative; No chest pain, presyncope, syncope, palpitations Mild intermittent lower extremity edema GI: Negative; No nausea, vomiting, diarrhea, or abdominal pain GU: Negative; No dysuria, hematuria, or difficulty voiding Musculoskeletal: Negative; no myalgias, joint pain, or weakness Hematologic/Oncology: Negative; no easy bruising, bleeding Endocrine: Positive for diabetes mellitus and thyroid disease. Neuro: Negative; no changes in balance, headaches Skin: Negative; No rashes or skin lesions Psychiatric: Negative; No behavioral problems, depression Sleep: Negative; No snoring, daytime sleepiness, hypersomnolence, bruxism, restless legs, hypnogognic hallucinations, no cataplexy Other comprehensive 14 point system review is  negative.  PE BP 132/68   Pulse 70   Ht '5\' 1"'$  (1.549 m)   Wt 157 lb (71.2 kg)   BMI 29.66 kg/m   Repeat blood pressure by me 120/70  Wt Readings from Last 3 Encounters:  12/02/16 157 lb (71.2 kg)  12/02/16 157 lb (71.2 kg)  08/13/16 147 lb 9.6 oz (67 kg)     Physical Exam BP 132/68   Pulse 70   Ht '5\' 1"'$  (1.549 m)   Wt 157 lb (71.2 kg)   BMI 29.66 kg/m  General: Alert, oriented, no distress.  Skin: normal turgor, no rashes, warm and dry HEENT: Normocephalic, atraumatic. Pupils equal round and reactive to light; sclera anicteric; extraocular muscles intact;  Nose without nasal septal hypertrophy Mouth/Parynx benign; Mallinpatti scale 3 Neck: No JVD, no carotid bruits; normal carotid upstroke Lungs: clear to ausculatation and percussion; no wheezing or rales Chest wall: without tenderness to palpitation Heart: PMI not displaced, RRR, s1 s2 normal, 2/6 systolic murmur, no diastolic murmur, no rubs, gallops, thrills, or heaves Abdomen: soft, nontender; no hepatosplenomehaly, BS+; abdominal aorta nontender and not dilated by palpation. Back: no CVA tenderness Pulses 2+ Musculoskeletal: full range  of motion, normal strength, no joint deformities Extremities: Trivial ankle edema no clubbing cyanosis, Homan's sign negative  Neurologic: grossly nonfocal; Cranial nerves grossly wnl Psychologic: Normal mood and affect   ECG (independently read by me): ventricular paced rhythm at 72 bpm with a PVC.  QTc interval 508 ms.  QRS interval 176 ms.  March 2018 ECG (independently read by me): Underlying atrial fibrillation with ventricular paced rhythm at 70 bpm.  On 100% paced  September 2017 ECG (independently read by me): V paced rhythm at 72 bpm.  One PVC.  January 2017 ECG (independently read by me):  V paced rhythm at 70 bpm with underlying atrial fibrillation.  August 2016 ECG (independently read by me): 100% ventricular paced rhythm at 70 bpm with underlying atrial  fibrillation.  August 2015 ECG (independently read by me): Underlying atrial fibrillation with ventricular paced rhythm at 70 beats per minute with 100% capture.  LABS BMP Latest Ref Rng & Units 11/20/2015 06/14/2015 06/07/2015  Glucose 65 - 99 mg/dL 106(H) 151(H) 107(H)  BUN 7 - 25 mg/dL 22 31(H) 28(H)  Creatinine 0.60 - 0.88 mg/dL 1.04(H) 1.20(H) 1.19(H)  Sodium 135 - 146 mmol/L 138 141 138  Potassium 3.5 - 5.3 mmol/L 3.5 3.5 3.6  Chloride 98 - 110 mmol/L 99 99(L) 101  CO2 20 - 31 mmol/L 28 - 27  Calcium 8.6 - 10.4 mg/dL 9.4 - 9.2   Hepatic Function Latest Ref Rng & Units 11/20/2015 06/07/2015 06/05/2012  Total Protein 6.1 - 8.1 g/dL 7.2 7.7 -  Albumin 3.6 - 5.1 g/dL 4.3 4.3 3.1(L)  AST 10 - 35 U/L 38(H) 25 -  ALT 6 - 29 U/L 33(H) 21 -  Alk Phosphatase 33 - 130 U/L 108 82 -  Total Bilirubin 0.2 - 1.2 mg/dL 1.3(H) 1.7(H) -   CBC Latest Ref Rng & Units 11/20/2015 06/14/2015 06/10/2015  WBC 3.8 - 10.8 K/uL 7.0 - 4.7  Hemoglobin 11.7 - 15.5 g/dL 13.8 15.0 11.7(L)  Hematocrit 35.0 - 45.0 % 42.5 44.0 37.9  Platelets 140 - 400 K/uL 158 - 138(L)   Lab Results  Component Value Date   MCV 85.9 11/20/2015   MCV 87.3 06/10/2015   MCV 87.3 06/09/2015   Lab Results  Component Value Date   TSH 5.806 (H) 06/07/2015   Lab Results  Component Value Date   HGBA1C 6.5 (H) 06/07/2015    Lipid Panel  No results found for: CHOL, TRIG, HDL, CHOLHDL, VLDL, LDLCALC, LDLDIRECT   INR today checked in office: 2.0  RADIOLOGY: No results found.  IMPRESSION:  1. Long term current use of anticoagulant therapy   2. Permanent atrial fibrillation ([place])   3. Essential hypertension, benign   4. Medication management   5. Hyperlipidemia, unspecified hyperlipidemia type   6. Pacemaker   7. Type 2 diabetes mellitus without complication, without long-term current use of insulin ([place])   8. Chronic combined systolic and diastolic heart failure ([place])     ASSESSMENT AND PLAN: Ms. Randle is an 83 year old  female who has a history of permanent atrial fibrillation and is status post AV node ablation and implantation of a St. Jude identity dual-chamber pacemaker programmed to VVIR mode in light of her atrial fibrillation. She underwent pacemaker interrogation earlier today by Dr. Sallyanne Kuster an estimated longevity is a proximally 6 months.  She will need an upgrade next year.  She was  hospitalized in March 2017 and was  treated with IV Lasix but I do not have the specifics of  her hospital records, but I suspect she may of had volume overload.  Echo Doppler study showed an EF of 45-50%.  Her blood pressure today is stable and on repeat by me was 120/64 on her regimen consisting of amlodipine 5 mg, furosemide 40 mg on Monday, Wednesday and Friday and she takes HCTZ 25 mg daily.  She is on metoprolol, succinate 50 mg daily, ramipril 5 mg.  She is on warfarin with target INR at 2-2.5. Her Cha2ds2Vasc score of 5 based on 2 points for age, 1 point for gender, heart failure, and hypertension. Her INR today is therapeutic.  She continues to be on atorvastatin for hyperlipidemia.  I am recommending complete set of fasting laboratory be checked including a CBC, chemistry profile, TSH, lipid studies and hemoglobin A1c.  In February 2019 I am scheduling her for a follow-up echo Doppler study to reassess both systolic and diastolic function and, previously noted moderately severe TR and moderate pulmonary hypertension.  She will be undergoing a pacemaker upgrade next year.  I will see her in 6 months for reevaluation.   Time spent:  25 minutes  Troy Sine, MD, [facility]  12/04/2016 7:07 PM

## 2016-12-02 NOTE — Progress Notes (Signed)
Patient ID: Denise [name], female   DOB: 03-11-1924, 83 y.o.   MRN: [id number]    Cardiology Office Note    Date:  12/02/2016   ID:  Denise [name], DOB 1924-02-13, MRN [id number]  PCP:  Daryl Eastern, MD  Cardiologist:  Nicki Guadalajara, MD;  Thurmon Fair, MD   Chief Complaint  Patient presents with  . Follow-up    pt denied chest pain and SOB    History of Present Illness:  Denise [name] is a 83 y.o. female with complete heart block after AV node ablation for difficult to control atrial fibrillation, Chronic combined systolic diastolic heart failure who presents in follow-up.  She has a dual-chamber St. Jude identity device that was implanted in 2006 but the device is programmed VVIR since she is now in permanent atrial fibrillation. Her device is not amenable for remote monitoring.  She has an appointment later today with Dr. Daphene Jaeger for heart failure management.  Pacemaker interrogation today shows that her St. Jude identity ADX XL DR device only has 6 months of estimated longevity. She has 96 % ventricular pacing. She has an underlying escape rhythm at 34 bpm. She has not had any recorded ventricular tachycardia. Lead sensing, impedance and threshold all remain excellent.  Echocardiogram March 2017 showed an ejection fraction of 45-50%, mild mitral regurgitation, moderate to severe tricuspid regurgitation with an estimated systolic PA pressure 56 mmHg. Her echo in 2012 had described moderate to severe mitral regurgitation   Past Medical History:  Diagnosis Date  . CHF (congestive heart failure) (HCC)   . Diabetes mellitus without complication (HCC)   . Hypertension   . LVH (left ventricular hypertrophy)   . Mitral regurgitation    mod to severe  . Permanent atrial fibrillation (HCC)   . Pulmonary hypertension (HCC)    severe  . Thyroid disease   . Tricuspid regurgitation    severe    Past Surgical History:  Procedure Laterality Date  . ABDOMINAL  HYSTERECTOMY    . CARDIOVERSION  5 /18/ 2000   unsuccessful  . CHOLECYSTECTOMY    . COMPRESSION HIP SCREW Right 06/06/2012   Procedure: Open Reduction Internal Fixation Right HIp;  Surgeon: Dannielle Huh, MD;  Location: [facility] OR;  Service: Orthopedics;  Laterality: Right;  . ESOPHAGOGASTRODUODENOSCOPY N/A 06/09/2015   Procedure: ESOPHAGOGASTRODUODENOSCOPY (EGD);  Surgeon: Charlott Rakes, MD;  Location: [facility] ENDOSCOPY;  Service: Endoscopy;  Laterality: N/A;  . knee replacement     R  . NM MYOCAR PERF WALL MOTION  02/28/2009   No signficant ischemia  . PACEMAKER INSERTION  05/26/2004   St.Jude  . THYROIDECTOMY    . TUBAL LIGATION    . US ECHOCARDIOGRAPHY  02/25/2011   mod to severe MR,LA severe dilated,mild annular ca+,mod. TR,mod PI,mod ca+ of the aortic valve leaflet    Current Medications: Outpatient Medications Prior to Visit  Medication Sig Dispense Refill  . amLODipine (NORVASC) 5 MG tablet Take 1 tablet (5 mg total) by mouth every morning. 90 tablet 3  . atorvastatin (LIPITOR) 10 MG tablet Take 10 mg by mouth every evening.    . hydrochlorothiazide (HYDRODIURIL) 25 MG tablet Take 0.5 tablets (12.5 mg total) by mouth every morning. (Patient taking differently: Take 25 mg by mouth every morning. ) 90 tablet 1  . isosorbide mononitrate (IMDUR) 30 MG 24 hr tablet Take 1 tablet (30 mg total) by mouth every morning. 90 tablet 1  . metoprolol succinate (TOPROL-XL) 50 MG 24 hr tablet  Take 1 tablet (50 mg total) by mouth daily. Take with or immediately following a meal. 90 tablet 3  . potassium chloride SA (K-DUR,KLOR-CON) 20 MEQ tablet Take 1-2 tablets (20-40 mEq total) by mouth daily as directed 60 tablet 11  . ramipril (ALTACE) 5 MG capsule Take 1 capsule (5 mg total) by mouth daily. 30 capsule 6  . warfarin (COUMADIN) 5 MG tablet TAKE 1 TABLET BY MOUTH DAILY AS DIRECTED BY COUMADIN CLINIC 95 tablet 0  . furosemide (LASIX) 40 MG tablet Take 40 mg by mouth every other day.     . methimazole  (TAPAZOLE) 10 MG tablet Take 5 mg by mouth every morning.      No facility-administered medications prior to visit.      Allergies:   Iohexol and Shrimp [shellfish allergy]   Social History   Social History  . Marital status: Widowed    Spouse name: N/A  . Number of children: N/A  . Years of education: N/A   Social History Main Topics  . Smoking status: Never Smoker  . Smokeless tobacco: Never Used  . Alcohol use No  . Drug use: No  . Sexual activity: No   Other Topics Concern  . None   Social History Narrative  . None     Family History:  The patient's family history includes Asthma in her mother; Cancer - Colon in her brother; Gallbladder disease in her maternal grandmother; Heart attack in her brother and father; Heart disease in her child; Stroke in her maternal grandfather and sister.   ROS:   Please see the history of present illness.    ROS All other systems reviewed and are negative.   PHYSICAL EXAM:   VS:  BP (!) 122/56   Pulse 72   Ht 5\' 1"  (1.549 m)   Wt 157 lb (71.2 kg)   BMI 29.66 kg/m     General: Alert, oriented x3, no distress, overweight Head: no evidence of trauma, PERRL, EOMI, no exophtalmos or lid lag, no myxedema, no xanthelasma; normal ears, nose and oropharynx Neck: normal jugular venous pulsations and no hepatojugular reflux; brisk carotid pulses without delay and no carotid bruits Chest: clear to auscultation, no signs of consolidation by percussion or palpation, normal fremitus, symmetrical and full respiratory excursions, healthy left subclavian pacemaker site Cardiovascular: normal position and quality of the apical impulse, regular rhythm, normal first and second heart sounds, no murmurs, rubs or gallops Abdomen: no tenderness or distention, no masses by palpation, no abnormal pulsatility or arterial bruits, normal bowel sounds, no hepatosplenomegaly Extremities: no clubbing, cyanosis or edema; 2+ radial, ulnar and brachial pulses  bilaterally; 2+ right femoral, posterior tibial and dorsalis pedis pulses; 2+ left femoral, posterior tibial and dorsalis pedis pulses; no subclavian or femoral bruits Neurological: grossly nonfocal Psych: Normal mood and affect   Wt Readings from Last 3 Encounters:  12/02/16 157 lb (71.2 kg)  08/13/16 147 lb 9.6 oz (67 kg)  05/18/16 156 lb (70.8 kg)      Studies/Labs Reviewed:   EKG:  EKG is ordered today.  Atrial fibrillation with ventricular paced rhythm and a single PVC. QRS 176 ms, QTC 508 ms.  ASSESSMENT:    1. Chronic combined systolic and diastolic heart failure (HCC)   2. Permanent atrial fibrillation (HCC)   3. Long term current use of anticoagulant therapy   4. CHB (complete heart block) (HCC)   5. Essential hypertension   6. Pacemaker      PLAN:  In  order of problems listed above:  1. CHF: Well compensated heart failure on a low dose of diuretic. Sees Dr. Tresa Endo later today. She has pacing-induced dyssynchrony, but does not appear to need CRT. 2. Permanent atrial fibrillation s/p AV node ablation. CHADSVasc 5 (age 26, gender, HF, HTN). On anticoagulation. 3. Warfarin: No bleeding complications recently. We'll plan to continue full anticoagulation during upcoming pacemaker generator change out, trying to keep INR 2.0-2.5 4. CHB: She used to have a reasonably reliable escape rhythm, but there is no escape rhythm today. She is Pacemaker dependent,  following AV node ablation. 5. HTN: good control 6. Pacemaker: Needs to have her device reliably checked in 3 months. . Anticipate need for pacemaker change out sometime early next year. We discussed the technical details of generator change out in detail with the patient and her daughter today.   Medication Adjustments/Labs and Tests Ordered: Current medicines are reviewed at length with the patient today.  Concerns regarding medicines are outlined above.  Medication changes, Labs and Tests ordered today are listed in the  Patient Instructions below. There are no Patient Instructions on file for this visit.   Signed, Thurmon Fair, MD  12/02/2016 9:48 AM    [facility] [address] [place], [place], [place]  [address] Phone: [phone]; Fax: [phone]

## 2016-12-02 NOTE — Patient Instructions (Signed)
Dr Croitoru recommends that you schedule a follow-up appointment in 3 months with a pacemaker check.  If you need a refill on your cardiac medications before your next appointment, please call your pharmacy. 

## 2016-12-30 LAB — PROTIME-INR: INR: 2 — AB (ref 0.9–1.1)

## 2017-01-05 ENCOUNTER — Other Ambulatory Visit: Payer: Self-pay | Admitting: *Deleted

## 2017-01-05 MED ORDER — RAMIPRIL 5 MG PO CAPS: 5.0000 mg | ORAL_CAPSULE | Freq: Every day | ORAL | 6 refills | Status: DC

## 2017-01-05 NOTE — Telephone Encounter (Signed)
REFILL 

## 2017-01-05 NOTE — Telephone Encounter (Signed)
Pharmacy requests a ninety day rx. 

## 2017-02-17 ENCOUNTER — Ambulatory Visit (INDEPENDENT_AMBULATORY_CARE_PROVIDER_SITE_OTHER): Payer: Medicare Other | Admitting: Pharmacist

## 2017-02-17 ENCOUNTER — Encounter: Payer: Self-pay | Admitting: Cardiovascular Disease

## 2017-02-17 ENCOUNTER — Ambulatory Visit (INDEPENDENT_AMBULATORY_CARE_PROVIDER_SITE_OTHER): Payer: Medicare Other | Admitting: Cardiovascular Disease

## 2017-02-17 VITALS — BP 139/75 | HR 69 | Ht 60.0 in | Wt 159.0 lb

## 2017-02-17 DIAGNOSIS — I442 Atrioventricular block, complete: Secondary | ICD-10-CM | POA: Diagnosis not present

## 2017-02-17 DIAGNOSIS — I10 Essential (primary) hypertension: Secondary | ICD-10-CM | POA: Diagnosis not present

## 2017-02-17 DIAGNOSIS — I4821 Permanent atrial fibrillation: Secondary | ICD-10-CM

## 2017-02-17 DIAGNOSIS — Z7901 Long term (current) use of anticoagulants: Secondary | ICD-10-CM

## 2017-02-17 DIAGNOSIS — I4891 Unspecified atrial fibrillation: Secondary | ICD-10-CM

## 2017-02-17 DIAGNOSIS — I5042 Chronic combined systolic (congestive) and diastolic (congestive) heart failure: Secondary | ICD-10-CM | POA: Diagnosis not present

## 2017-02-17 DIAGNOSIS — I482 Chronic atrial fibrillation: Secondary | ICD-10-CM

## 2017-02-17 DIAGNOSIS — Z950 Presence of cardiac pacemaker: Secondary | ICD-10-CM | POA: Diagnosis not present

## 2017-02-17 LAB — POCT INR: INR: 3.1

## 2017-02-17 NOTE — Patient Instructions (Signed)
Dr Royann Shivers recommends that you continue on your current medications as directed. Please refer to the Current Medication list given to you today.  Your physician recommends that you schedule an appointment in 1 month at [facility] in the device clinic.  Dr Royann Shivers recommends that you schedule a follow-up appointment in 3 months with a pacemaker check.  If you need a refill on your cardiac medications before your next appointment, please call your pharmacy.

## 2017-02-17 NOTE — Progress Notes (Signed)
Patient ID: Denise [name], female   DOB: 1923/08/03, 83 y.o.   MRN: [id number]    Cardiology Office Note    Date:  02/17/2017   ID:  Denise [name], DOB [birth year], MRN [id number]  PCP:  Daryl Eastern, MD  Cardiologist:  Nicki Guadalajara, MD;  Thurmon Fair, MD   Chief Complaint  Patient presents with  . Follow-up    History of Present Illness:  Denise [name] is a 83 y.o. female with complete heart block after AV node ablation for difficult to control atrial fibrillation, Chronic combined systolic diastolic heart failure who presents in follow-up.  She has a dual-chamber St. Jude identity device that was implanted in 2006 but the device is programmed VVIR since she is now in permanent atrial fibrillation. Her device is not amenable for remote monitoring.  Pacemaker interrogation today shows that her St. Jude identity ADX XL DR device only has 3 months of estimated longevity. She has 98 % ventricular pacing. She does not have an underlying escape rhythm today. She has not had any recorded ventricular tachycardia. Lead sensing, impedance and threshold all remain excellent.  Echocardiogram March 2017 showed an ejection fraction of 45-50%, mild mitral regurgitation, moderate to severe tricuspid regurgitation with an estimated systolic PA pressure 56 mmHg. Her echo in 2012 had described moderate to severe mitral regurgitation.  She does not like taking the furosemide. She claims it causes diarrhea. She is only taking it on Monday and Friday.  She did not take any medications today yet.  Blood pressure is a little high  Past Medical History:  Diagnosis Date  . CHF (congestive heart failure) (HCC)   . Diabetes mellitus without complication (HCC)   . Hypertension   . LVH (left ventricular hypertrophy)   . Mitral regurgitation    mod to severe  . Permanent atrial fibrillation (HCC)   . Pulmonary hypertension (HCC)    severe  . Thyroid disease   . Tricuspid regurgitation    severe    Past Surgical History:  Procedure Laterality Date  . ABDOMINAL HYSTERECTOMY    . CARDIOVERSION  5 /18/ 2000   unsuccessful  . CHOLECYSTECTOMY    . COMPRESSION HIP SCREW Right 06/06/2012   Procedure: Open Reduction Internal Fixation Right HIp;  Surgeon: Dannielle Huh, MD;  [facility];  Service: Orthopedics;  Laterality: Right;  . ESOPHAGOGASTRODUODENOSCOPY N/A 06/09/2015   Procedure: ESOPHAGOGASTRODUODENOSCOPY (EGD);  Surgeon: Charlott Rakes, MD;  Location: [facility] ENDOSCOPY;  Service: Endoscopy;  Laterality: N/A;  . knee replacement     R  . NM MYOCAR PERF WALL MOTION  02/28/2009   No signficant ischemia  . PACEMAKER INSERTION  05/26/2004   St.Jude  . THYROIDECTOMY    . TUBAL LIGATION    . US ECHOCARDIOGRAPHY  02/25/2011   mod to severe MR,LA severe dilated,mild annular ca+,mod. TR,mod PI,mod ca+ of the aortic valve leaflet    Current Medications: Outpatient Medications Prior to Visit  Medication Sig Dispense Refill  . amLODipine (NORVASC) 5 MG tablet Take 1 tablet (5 mg total) by mouth every morning. 90 tablet 3  . atorvastatin (LIPITOR) 10 MG tablet Take 10 mg by mouth every evening.    . furosemide (LASIX) 40 MG tablet Take by mouth. On Monday Wednesday and Friday    . hydrochlorothiazide (HYDRODIURIL) 25 MG tablet Take 0.5 tablets (12.5 mg total) by mouth every morning. (Patient taking differently: Take 25 mg by mouth every morning. ) 90 tablet 1  . isosorbide mononitrate (  IMDUR) 30 MG 24 hr tablet Take 1 tablet (30 mg total) by mouth every morning. 90 tablet 1  . methimazole (TAPAZOLE) 5 MG tablet Take 5mg  on MWF Sun and 10mg  on T, Th, Saturday    . metoprolol succinate (TOPROL-XL) 50 MG 24 hr tablet Take 1 tablet (50 mg total) by mouth daily. Take with or immediately following a meal. 90 tablet 3  . potassium chloride SA (K-DUR,KLOR-CON) 20 MEQ tablet Take 1-2 tablets (20-40 mEq total) by mouth daily as directed 60 tablet 11  . ramipril (ALTACE) 5 MG capsule Take 1  capsule (5 mg total) by mouth daily. 30 capsule 6  . warfarin (COUMADIN) 5 MG tablet TAKE 1 TABLET BY MOUTH DAILY AS DIRECTED BY COUMADIN CLINIC 95 tablet 0   No facility-administered medications prior to visit.      Allergies:   Iohexol and Shrimp [shellfish allergy]   Social History   Socioeconomic History  . Marital status: Widowed    Spouse name: None  . Number of children: None  . Years of education: None  . Highest education level: None  Social Needs  . Financial resource strain: None  . Food insecurity - worry: None  . Food insecurity - inability: None  . Transportation needs - medical: None  . Transportation needs - non-medical: None  Occupational History  . None  Tobacco Use  . Smoking status: Never Smoker  . Smokeless tobacco: Never Used  Substance and Sexual Activity  . Alcohol use: No  . Drug use: No  . Sexual activity: No  Other Topics Concern  . None  Social History Narrative  . None     Family History:  The patient's family history includes Asthma in her mother; Cancer - Colon in her brother; Gallbladder disease in her maternal grandmother; Heart attack in her brother and father; Heart disease in her child; Stroke in her maternal grandfather and sister.   ROS:   Please see the history of present illness.    ROS All other systems reviewed and are negative.   PHYSICAL EXAM:   VS:  BP 139/75   Pulse 69   Ht 5' (1.524 m)   Wt 159 lb (72.1 kg)   BMI 31.05 kg/m      General: Alert, oriented x3, no distress, mildly obese Head: no evidence of trauma, PERRL, EOMI, no exophtalmos or lid lag, no myxedema, no xanthelasma; normal ears, nose and oropharynx Neck: 6-7cm elevation in jugular venous pulsations and no hepatojugular reflux; brisk carotid pulses without delay and no carotid bruits Chest: clear to auscultation, no signs of consolidation by percussion or palpation, normal fremitus, symmetrical and full respiratory excursions Cardiovascular: normal  position and quality of the apical impulse, regular rhythm, normal first and paradoxically split second heart sounds, no murmurs, rubs or gallops. healthy left subclavian pacemaker site Abdomen: no tenderness or distention, no masses by palpation, no abnormal pulsatility or arterial bruits, normal bowel sounds, no hepatosplenomegaly Extremities: no clubbing, cyanosis or edema; 2+ radial, ulnar and brachial pulses bilaterally; 2+ right femoral, posterior tibial and dorsalis pedis pulses; 2+ left femoral, posterior tibial and dorsalis pedis pulses; no subclavian or femoral bruits Neurological: grossly nonfocal Psych: Normal mood and affect    Wt Readings from Last 3 Encounters:  02/17/17 159 lb (72.1 kg)  12/02/16 157 lb (71.2 kg)  12/02/16 157 lb (71.2 kg)      Studies/Labs Reviewed:   EKG:  EKG is ordered today.  Shows atrial fibrillation with 100% ventricular  paced rhythm  ASSESSMENT:    1. Chronic combined systolic and diastolic CHF, NYHA class 2 (HCC)   2. Permanent atrial fibrillation (HCC)   3. Long term current use of anticoagulant therapy   4. CHB (complete heart block) (HCC)   5. Essential hypertension   6. Pacemaker      PLAN:  In order of problems listed above:  1. CHF:  She has pacing-induced dyssynchrony, but does not appear to need CRT.  She has minimal physical findings of fluid overload, but no dyspnea.  Encouraged her to weigh herself on a daily basis. 2. Permanent atrial fibrillation s/p AV node ablation. CHADSVasc 5 (age 58, gender, HF, HTN). On anticoagulation. 3. Warfarin: No bleeding complications recently. We'll plan to continue full anticoagulation during upcoming pacemaker generator change out, trying to keep INR 2.0-2.5 4. CHB: She used to have a reasonably reliable escape rhythm, but there has been no escape rhythm at the last 2 in office device checks.  She is Pacemaker dependent,  following AV node ablation. 5. HTN: good control usually, blood pressure  is a little high today but she has not yet taken medications. 6. Pacemaker: Reviewed again the details of the change out procedure with the patient and her son.  We will increase her pacemaker checks to monthly until she reaches ERI.  Anticipate she will need a pacemaker change out in March or April of next year.   Medication Adjustments/Labs and Tests Ordered: Current medicines are reviewed at length with the patient today.  Concerns regarding medicines are outlined above.  Medication changes, Labs and Tests ordered today are listed in the Patient Instructions below. Patient Instructions  Dr Royann Shiversroitoru recommends that you continue on your current medications as directed. Please refer to the Current Medication list given to you today.  Your physician recommends that you schedule an appointment [facility] in [facility] device clinic.  Dr Royann Shiversroitoru recommends that you schedule a follow-up appointment in 3 months with a pacemaker check.  If you need a refill on your cardiac medications before your next appointment, please call your pharmacy.    Signed, Thurmon FairMihai Bernd Crom, MD  02/17/2017 9:07 AM    [facility] [address], [place], [place]  [address] Phone: [phone]; Fax: [phone]

## 2017-02-19 ENCOUNTER — Other Ambulatory Visit: Payer: Self-pay

## 2017-02-26 LAB — CUP PACEART INCLINIC DEVICE CHECK
Date Time Interrogation Session: 20181214154239
Implantable Lead Implant Date: 20060313
Implantable Lead Location: 753859
Implantable Pulse Generator Implant Date: 20060313
Lead Channel Setting Pacing Amplitude: 0.75 V
Lead Channel Setting Pacing Pulse Width: 0.5 ms
MDC IDC LEAD IMPLANT DT: 20060313
MDC IDC LEAD LOCATION: 753860
MDC IDC MSMT BATTERY VOLTAGE: 2.6 V
MDC IDC MSMT LEADCHNL RV IMPEDANCE VALUE: 570 Ohm
MDC IDC MSMT LEADCHNL RV PACING THRESHOLD AMPLITUDE: 0.75 V
MDC IDC MSMT LEADCHNL RV PACING THRESHOLD PULSEWIDTH: 0.5 ms
MDC IDC SET LEADCHNL RV SENSING SENSITIVITY: 4 mV
MDC IDC STAT BRADY RV PERCENT PACED: 98 %
Pulse Gen Model: 5386
Pulse Gen Serial Number: 1457119

## 2017-03-03 ENCOUNTER — Other Ambulatory Visit: Payer: Self-pay | Admitting: Pharmacist

## 2017-03-03 MED ORDER — WARFARIN SODIUM 5 MG PO TABS: ORAL_TABLET | ORAL | 0 refills | Status: DC

## 2017-03-13 ENCOUNTER — Other Ambulatory Visit: Payer: Self-pay | Admitting: Cardiovascular Disease

## 2017-03-22 ENCOUNTER — Ambulatory Visit (INDEPENDENT_AMBULATORY_CARE_PROVIDER_SITE_OTHER): Payer: Self-pay | Admitting: *Deleted

## 2017-03-22 DIAGNOSIS — I442 Atrioventricular block, complete: Secondary | ICD-10-CM

## 2017-03-22 LAB — CUP PACEART INCLINIC DEVICE CHECK
Battery Remaining Longevity: 3 mo
Implantable Lead Implant Date: 20060313
Implantable Lead Location: 753860
Implantable Pulse Generator Implant Date: 20060313
Lead Channel Impedance Value: 518 Ohm
Lead Channel Setting Pacing Amplitude: 2.5 V
Lead Channel Setting Pacing Pulse Width: 0.5 ms
Lead Channel Setting Sensing Sensitivity: 4 mV
MDC IDC LEAD IMPLANT DT: 20060313
MDC IDC LEAD LOCATION: 753859
MDC IDC MSMT BATTERY IMPEDANCE: 28300 Ohm
MDC IDC MSMT BATTERY VOLTAGE: 2.58 V
MDC IDC PG SERIAL: 1457119
MDC IDC SESS DTM: 20190107094301

## 2017-03-22 NOTE — Progress Notes (Signed)
Pacemaker in clinic check for battery. Battery remaining longevity 0.25year. ROV w/ device clinic 04/28/2017

## 2017-03-29 ENCOUNTER — Other Ambulatory Visit: Payer: Self-pay | Admitting: Cardiovascular Disease

## 2017-03-29 MED ORDER — ISOSORBIDE MONONITRATE ER 30 MG PO TB24: 30.0000 mg | ORAL_TABLET | Freq: Every morning | ORAL | 3 refills | Status: DC

## 2017-03-29 NOTE — Telephone Encounter (Signed)
New Message    *STAT* If patient is at the pharmacy, call can be transferred to refill team.   1. Which medications need to be refilled? (please list name of each medication and dose if known) isosorbide mononitrate 30 mg 24 hr tablet 2. Which pharmacy/location (including street and city if local pharmacy) is medication to be sent to? [facility]   3. Do they need a 30 day or 90 day supply? 90 day supply

## 2017-03-29 NOTE — Telephone Encounter (Signed)
Rx(s) sent to pharmacy electronically.  

## 2017-04-12 ENCOUNTER — Other Ambulatory Visit: Payer: Self-pay

## 2017-04-12 MED ORDER — POTASSIUM CHLORIDE CRYS ER 20 MEQ PO TBCR: EXTENDED_RELEASE_TABLET | ORAL | 3 refills | Status: DC

## 2017-04-28 ENCOUNTER — Other Ambulatory Visit: Payer: Self-pay

## 2017-04-28 ENCOUNTER — Ambulatory Visit (INDEPENDENT_AMBULATORY_CARE_PROVIDER_SITE_OTHER): Payer: Self-pay | Admitting: *Deleted

## 2017-04-28 ENCOUNTER — Ambulatory Visit (HOSPITAL_COMMUNITY): Payer: Medicare Other | Attending: Cardiovascular Disease

## 2017-04-28 DIAGNOSIS — I5042 Chronic combined systolic (congestive) and diastolic (congestive) heart failure: Secondary | ICD-10-CM

## 2017-04-28 DIAGNOSIS — I420 Dilated cardiomyopathy: Secondary | ICD-10-CM | POA: Insufficient documentation

## 2017-04-28 DIAGNOSIS — I482 Chronic atrial fibrillation: Secondary | ICD-10-CM | POA: Diagnosis present

## 2017-04-28 DIAGNOSIS — I4891 Unspecified atrial fibrillation: Secondary | ICD-10-CM

## 2017-04-28 DIAGNOSIS — I10 Essential (primary) hypertension: Secondary | ICD-10-CM | POA: Diagnosis not present

## 2017-04-28 DIAGNOSIS — I083 Combined rheumatic disorders of mitral, aortic and tricuspid valves: Secondary | ICD-10-CM | POA: Diagnosis not present

## 2017-04-28 DIAGNOSIS — I110 Hypertensive heart disease with heart failure: Secondary | ICD-10-CM | POA: Diagnosis present

## 2017-04-28 DIAGNOSIS — I4821 Permanent atrial fibrillation: Secondary | ICD-10-CM

## 2017-04-28 LAB — CUP PACEART INCLINIC DEVICE CHECK
Battery Remaining Longevity: 0 mo
Implantable Lead Implant Date: 20060313
Implantable Lead Location: 753860
Implantable Pulse Generator Implant Date: 20060313
Lead Channel Impedance Value: 613 Ohm
Lead Channel Setting Pacing Amplitude: 2.5 V
Lead Channel Setting Pacing Pulse Width: 0.5 ms
Lead Channel Setting Sensing Sensitivity: 4 mV
MDC IDC LEAD IMPLANT DT: 20060313
MDC IDC LEAD LOCATION: 753859
MDC IDC MSMT BATTERY IMPEDANCE: 29000 Ohm
MDC IDC MSMT BATTERY VOLTAGE: 2.56 V
MDC IDC SESS DTM: 20190213115010
Pulse Gen Model: 5386
Pulse Gen Serial Number: 1457119

## 2017-04-28 NOTE — Progress Notes (Signed)
Battery check only. Voltage 2.53V (ERI 2.5V). ROV with [facility] 3/6

## 2017-05-11 ENCOUNTER — Ambulatory Visit (INDEPENDENT_AMBULATORY_CARE_PROVIDER_SITE_OTHER): Payer: Medicare Other | Admitting: Cardiovascular Disease

## 2017-05-11 ENCOUNTER — Ambulatory Visit (INDEPENDENT_AMBULATORY_CARE_PROVIDER_SITE_OTHER): Payer: Medicare Other | Admitting: Pharmacist Clinician (PhC)/ Clinical Pharmacy Specialist

## 2017-05-11 ENCOUNTER — Encounter: Payer: Self-pay | Admitting: Cardiovascular Disease

## 2017-05-11 VITALS — BP 122/72 | HR 70 | Ht 61.0 in | Wt 150.6 lb

## 2017-05-11 DIAGNOSIS — I482 Chronic atrial fibrillation: Secondary | ICD-10-CM

## 2017-05-11 DIAGNOSIS — I5042 Chronic combined systolic (congestive) and diastolic (congestive) heart failure: Secondary | ICD-10-CM

## 2017-05-11 DIAGNOSIS — I10 Essential (primary) hypertension: Secondary | ICD-10-CM | POA: Diagnosis not present

## 2017-05-11 DIAGNOSIS — Z950 Presence of cardiac pacemaker: Secondary | ICD-10-CM

## 2017-05-11 DIAGNOSIS — I4821 Permanent atrial fibrillation: Secondary | ICD-10-CM

## 2017-05-11 DIAGNOSIS — Z7901 Long term (current) use of anticoagulants: Secondary | ICD-10-CM

## 2017-05-11 DIAGNOSIS — I4891 Unspecified atrial fibrillation: Secondary | ICD-10-CM

## 2017-05-11 LAB — COMPREHENSIVE METABOLIC PANEL
ALT: 20 IU/L (ref 0–32)
AST: 19 IU/L (ref 0–40)
Albumin/Globulin Ratio: 1.3 (ref 1.2–2.2)
Albumin: 4.2 g/dL (ref 3.2–4.6)
Alkaline Phosphatase: 123 IU/L — ABNORMAL HIGH (ref 39–117)
BUN/Creatinine Ratio: 21 (ref 12–28)
BUN: 23 mg/dL (ref 10–36)
Bilirubin Total: 1.3 mg/dL — ABNORMAL HIGH (ref 0.0–1.2)
CO2: 25 mmol/L (ref 20–29)
CREATININE: 1.1 mg/dL — AB (ref 0.57–1.00)
Calcium: 9.6 mg/dL (ref 8.7–10.3)
Chloride: 100 mmol/L (ref 96–106)
GFR calc Af Amer: 51 mL/min/{1.73_m2} — ABNORMAL LOW (ref >59)
GFR calc non Af Amer: 44 mL/min/{1.73_m2} — ABNORMAL LOW (ref >59)
Globulin, Total: 3.2 g/dL (ref 1.5–4.5)
Glucose: 127 mg/dL — ABNORMAL HIGH (ref 65–99)
Potassium: 4 mmol/L (ref 3.5–5.2)
Sodium: 144 mmol/L (ref 134–144)
Total Protein: 7.4 g/dL (ref 6.0–8.5)

## 2017-05-11 LAB — PROTIME-INR
INR: 2.4 — ABNORMAL HIGH (ref 0.8–1.2)
PROTHROMBIN TIME: 23.5 s — AB (ref 9.1–12.0)

## 2017-05-11 LAB — CBC
HEMOGLOBIN: 12.9 g/dL (ref 11.1–15.9)
Hematocrit: 38.8 % (ref 34.0–46.6)
MCH: 27 pg (ref 26.6–33.0)
MCHC: 33.2 g/dL (ref 31.5–35.7)
MCV: 81 fL (ref 79–97)
Platelets: 217 10*3/uL (ref 150–379)
RBC: 4.78 x10E6/uL (ref 3.77–5.28)
RDW: 15.8 % — ABNORMAL HIGH (ref 12.3–15.4)
WBC: 7.7 10*3/uL (ref 3.4–10.8)

## 2017-05-11 LAB — MAGNESIUM: Magnesium: 1.7 mg/dL (ref 1.6–2.3)

## 2017-05-11 LAB — TSH: TSH: 0.007 u[IU]/mL — ABNORMAL LOW (ref 0.450–4.500)

## 2017-05-11 NOTE — Progress Notes (Signed)
Patient ID: [name], female   DOB: 03/15/1924, 84 y.o.   MRN: [id number]    HPI: SHIESHA JAHN is a 84 y.o. female who presents to the office today for an 8 -month followup cardiology evaluation.   Ms. Masse has a history of permanent atrial fibrillation and is status post AV node ablation by Dr. Mitzi Davenport and permanent pacemaker implantation in June 2006. She has a history of hypertensive cardiomyopathy. She has documented severe LA dilatation. On an echo in December 2012 ejection fraction was 45-50%. She had moderate to severe MR, moderate pulmonary hypertension a nodular sclerosis of the aortic valve the  She has a dual-chamber St. Jude identity permanent pacemaker. She is followed by Dr. Sallyanne Kuster for her pacemaker. Pacemaker interrogation revealed normal function, and no reprogramming changes were necessary.  She is pacemaker dependent and no escape rhythm was present.  When the pacemaker was taken down to 30 bpm.  She had excellent lead parameters and no high ventricular rates were recorded.  She was last evaluated by him on 07/09/2015.  In March 2014 she broke her right hip and her right arm and required surgery by Dr. Lorre Nick. After being hospitalized she went to a nursing facility .  In 2017 an echo Doppler study showed an EF of 45-50% with a questionable region of possible septal and inferior hypokinesis.  There was moderately severe tricuspid regurgitation.  There is moderate bony hypertension with PA pressure 56 mm.  She sees Dr. Sallyanne Kuster for pacemaker.  She is programmed in VVIR since she is now on permanent atrial fibrillation.  There was 96% ventricular pacing with an underlying escape rhythm at 34 bpm.  Her St. Jude identity pacemaker has only 6 months of estimated longevity.  Since I last saw her in September 2018 she denies any episodes of chest pain or shortness of breath.  She saw Dr. Sallyanne Kuster 02/17/2017 for follow-up evaluation.  She has chronic combined systolic and  diastolic heart failure near Heart Association class II.  She has permanent atrial fibrillation.  She has pacing-induced dyssynchrony but does not appear to need CRRT.  She denied any dyspnea.  She has underlying complete heart block and in the past had a reasonable reliable escape rhythm, but there is been no escape rhythm in the last 2 office evaluation device checks.  She is pacemaker dependent following her AV node ablation.    She underwent a follow-up echo Doppler study on 05/03/2017.  EF is 50-55%.  There was mild aortic sclerosis.  There was moderate mitral annular calcification with mild MR, there was severe biatrial enlargement.  There was moderate tricuspid regurgitation.    Presently, she denies any episodes of chest pain or shortness of breath.  She is on Coumadin for anticoagulation and denies bleeding.  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    Past Medical History:  Diagnosis Date  . CHF (congestive heart failure) ([place])   . Diabetes mellitus without complication ([place])   . Hypertension   . LVH (left ventricular hypertrophy)   . Mitral regurgitation    mod to severe  . Permanent atrial fibrillation ([place])   . Pulmonary hypertension (HCC)    severe  . Thyroid disease   . Tricuspid regurgitation    severe    Past Surgical History:  Procedure Laterality Date  . ABDOMINAL HYSTERECTOMY    . CARDIOVERSION  5 /18/ 2000   unsuccessful  . CHOLECYSTECTOMY    .  COMPRESSION HIP SCREW Right 06/06/2012   Procedure: Open Reduction Internal Fixation Right HIp;  Surgeon: Vickey Huger, MD;  Location: [facility];  Service: Orthopedics;  Laterality: Right;  . ESOPHAGOGASTRODUODENOSCOPY N/A 06/09/2015   Procedure: ESOPHAGOGASTRODUODENOSCOPY (EGD);  Surgeon: Wilford Corner, MD;  Location[facility] ENDOSCOPY;  Service: Endoscopy;  Laterality: N/A;  . knee replacement     R  . NM MYOCAR PERF WALL MOTION  02/28/2009   No signficant ischemia  . PACEMAKER  INSERTION  05/26/2004   St.Jude  . THYROIDECTOMY    . TUBAL LIGATION    . US ECHOCARDIOGRAPHY  02/25/2011   mod to severe MR,LA severe dilated,mild annular ca+,mod. TR,mod PI,mod ca+ of the aortic valve leaflet    Allergies  Allergen Reactions  . Iohexol Rash     Desc: VOMITING-VERIFIED ON 05-24-04 PRE-PROCEDURE/ ARS   . Shrimp [Shellfish Allergy] Nausea And Vomiting and Rash    Current Outpatient Medications  Medication Sig Dispense Refill  . amLODipine (NORVASC) 5 MG tablet TAKE 1 TABLET (5 MG TOTAL) BY MOUTH EVERY MORNING. 90 tablet 2  . atorvastatin (LIPITOR) 10 MG tablet Take 10 mg by mouth every evening.    . furosemide (LASIX) 40 MG tablet Take 40 mg by mouth. On Monday Wednesday and Friday    . hydrochlorothiazide (HYDRODIURIL) 25 MG tablet Take 0.5 tablets (12.5 mg total) by mouth every morning. (Patient taking differently: Take 25 mg by mouth every morning. ) 90 tablet 1  . isosorbide mononitrate (IMDUR) 30 MG 24 hr tablet Take 1 tablet (30 mg total) by mouth every morning. (Patient taking differently: Take 30 mg by mouth every morning. ) 90 tablet 3  . methimazole (TAPAZOLE) 5 MG tablet Take '5mg'$  on MWF Sun and '10mg'$  on T, Th, Saturday    . metoprolol succinate (TOPROL-XL) 50 MG 24 hr tablet Take 1 tablet (50 mg total) by mouth daily. Take with or immediately following a meal. 90 tablet 3  . potassium chloride SA (K-DUR,KLOR-CON) 20 MEQ tablet Take 1-2 tablets (20-40 mEq total) by mouth daily as directed 180 tablet 3  . ramipril (ALTACE) 5 MG capsule Take 1 capsule (5 mg total) by mouth daily. 30 capsule 6  . warfarin (COUMADIN) 5 MG tablet TAKE 1 TABLET BY MOUTH DAILY OR AS DIRECTED BY COUMADIN CLINIC 90 tablet 0  . furosemide (LASIX) 40 MG tablet TAKE 1 TABLET (40 MG TOTAL) BY MOUTH 3 (THREE) TIMES A WEEK. MONDAYS, WEDNESDAYS, AND FRIDAYS 90 tablet 3   No current facility-administered medications for this visit.     Socially she is widowed and has 4 children 4 grandchildren  4 great-grandchildren. There is no tobacco or alcohol use.  ROS General: Negative; No fevers, chills, or night sweats;  HEENT: Negative; No changes in vision or hearing, sinus congestion, difficulty swallowing Pulmonary: Negative; No cough, wheezing, shortness of breath, hemoptysis Cardiovascular: Negative; No chest pain, presyncope, syncope, palpitations Mild intermittent lower extremity edema GI: Negative; No nausea, vomiting, diarrhea, or abdominal pain GU: Negative; No dysuria, hematuria, or difficulty voiding Musculoskeletal: Negative; no myalgias, joint pain, or weakness Hematologic/Oncology: Negative; no easy bruising, bleeding Endocrine: Positive for diabetes mellitus and thyroid disease. Neuro: Negative; no changes in balance, headaches Skin: Negative; No rashes or skin lesions Psychiatric: Negative; No behavioral problems, depression Sleep: Negative; No snoring, daytime sleepiness, hypersomnolence, bruxism, restless legs, hypnogognic hallucinations, no cataplexy Other comprehensive 14 point system review is negative.  PE BP 122/72   Pulse 70   Ht '5\' 1"'$  (1.549 m)  Wt 150 lb 9.6 oz (68.3 kg)   BMI 28.46 kg/m    Repeat blood pressure by me was 120/72.  Wt Readings from Last 3 Encounters:  05/11/17 150 lb 9.6 oz (68.3 kg)  02/17/17 159 lb (72.1 kg)  12/02/16 157 lb (71.2 kg)   General: Alert, oriented, no distress.  Skin: normal turgor, no rashes, warm and dry HEENT: Normocephalic, atraumatic. Pupils equal round and reactive to light; sclera anicteric; extraocular muscles intact;  Nose without nasal septal hypertrophy Mouth/Parynx benign; Mallinpatti scale 3 Neck: No JVD, no carotid bruits; normal carotid upstroke Lungs: clear to ausculatation and percussion; no wheezing or rales Chest wall: without tenderness to palpitation Heart: PMI not displaced, RRR, s1 s2 normal, 2/6 systolic murmur, no diastolic murmur, no rubs, gallops, thrills, or heaves Abdomen: soft,  nontender; no hepatosplenomehaly, BS+; abdominal aorta nontender and not dilated by palpation. Back: no CVA tenderness Pulses 2+ Musculoskeletal: full range of motion, normal strength, no joint deformities Extremities: Trivial ankle edema no clubbing cyanosis, Homan's sign negative  Neurologic: grossly nonfocal; Cranial nerves grossly wnl Psychologic: Normal mood and affect  September 2018  ECG (independently read by me): ventricular paced rhythm at 72 bpm with a PVC.  QTc interval 508 ms.  QRS interval 176 ms.  March 2018 ECG (independently read by me): Underlying atrial fibrillation with ventricular paced rhythm at 70 bpm.  On 100% paced  September 2017 ECG (independently read by me): V paced rhythm at 72 bpm.  One PVC.  January 2017 ECG (independently read by me):  V paced rhythm at 70 bpm with underlying atrial fibrillation.  August 2016 ECG (independently read by me): 100% ventricular paced rhythm at 70 bpm with underlying atrial fibrillation.  August 2015 ECG (independently read by me): Underlying atrial fibrillation with ventricular paced rhythm at 70 beats per minute with 100% capture.  LABS BMP Latest Ref Rng & Units 05/11/2017 11/20/2015 06/14/2015  Glucose 65 - 99 mg/dL 127(H) 106(H) 151(H)  BUN 10 - 36 mg/dL 23 22 31(H)  Creatinine 0.57 - 1.00 mg/dL 1.10(H) 1.04(H) 1.20(H)  BUN/Creat Ratio 12 - 28 21 - -  Sodium 134 - 144 mmol/L 144 138 141  Potassium 3.5 - 5.2 mmol/L 4.0 3.5 3.5  Chloride 96 - 106 mmol/L 100 99 99(L)  CO2 20 - 29 mmol/L 25 28 -  Calcium 8.7 - 10.3 mg/dL 9.6 9.4 -   Hepatic Function Latest Ref Rng & Units 05/11/2017 11/20/2015 06/07/2015  Total Protein 6.0 - 8.5 g/dL 7.4 7.2 7.7  Albumin 3.2 - 4.6 g/dL 4.2 4.3 4.3  AST 0 - 40 IU/L 19 38(H) 25  ALT 0 - 32 IU/L 20 33(H) 21  Alk Phosphatase 39 - 117 IU/L 123(H) 108 82  Total Bilirubin 0.0 - 1.2 mg/dL 1.3(H) 1.3(H) 1.7(H)   CBC Latest Ref Rng & Units 05/11/2017 11/20/2015 06/14/2015  WBC 3.4 - 10.8 x10E3/uL 7.7  7.0 -  Hemoglobin 11.1 - 15.9 g/dL 12.9 13.8 15.0  Hematocrit 34.0 - 46.6 % 38.8 42.5 44.0  Platelets 150 - 379 x10E3/uL 217 158 -   Lab Results  Component Value Date   MCV 81 05/11/2017   MCV 85.9 11/20/2015   MCV 87.3 06/10/2015   Lab Results  Component Value Date   TSH 0.007 (L) 05/11/2017   Lab Results  Component Value Date   HGBA1C 6.5 (H) 06/07/2015    Lipid Panel  No results found for: CHOL, TRIG, HDL, CHOLHDL, VLDL, LDLCALC, LDLDIRECT   INR today checked  in office: 2.0  RADIOLOGY: No results found.  IMPRESSION:  1. Chronic combined systolic and diastolic heart failure ([place])   2. Permanent atrial fibrillation ([place])   3. Long term current use of anticoagulant therapy   4. Essential hypertension   5. Pacemaker     ASSESSMENT AND PLAN: Ms. Haydu is an 84 year old female who has a history of permanent atrial fibrillation and is status post AV node ablation and implantation of a St. Jude identity dual-chamber pacemaker programmed to VVIR mode in light of her atrial fibrillation. She was  hospitalized in March 2017 and was  treated with IV Lasix but I do not have the specifics of her hospital records, but I suspect she may of had volume overload.  Echo Doppler study at that time showed an EF of 45-50%. .  She recently underwent a follow-up echo Doppler study on 04/28/2017 which showed an EF of 50-55%.  There was mild aortic valve stenosis with trivial AR.  There was moderate mitral annular calcification with mild MR.  There was severe biatrial enlargement.  She is on warfarin.  We will check a 9 or today.  She is approaching near ERI and her most recent interrogation showed a battery voltage at 2.56.  She is undergoing monthly device checks.  She has a follow-up appointment to see Dr. Sallyanne Kuster next week on 05/19/2017.  I will check chemistry profile, CBC, TSH, magnesium level today since she's not had recent laboratory.  She denies any chest pain.  She denies any PND orthopnea.   Her blood pressure today is stable on furosemide 40 mg on Monday and Friday, Toprol-XL 50 mg amlodipine 5 mg, ramipril 5 mg, in addition to isosorbide 30 mg and HCTZ 12.5 mg daily.  I will see her in 6 months for reevaluation.  Time spent:  25 minutes  Troy Sine, MD,  [facility]  05/13/2017 8:15 PM

## 2017-05-11 NOTE — Patient Instructions (Signed)
Medication Instructions:  Your physician recommends that you continue on your current medications as directed. Please refer to the Current Medication list given to you today.  Labwork: TODAY (CMET, CBC, TSH, Mag)  Follow-Up: Dr. Royann Shivers as scheduled 3/6  Your physician wants you to follow-up in: 6 months with Dr. Tresa Endo. You will receive a reminder letter in the mail two months in advance. If you don't receive a letter, please call our office to schedule the follow-up appointment.     If you need a refill on your cardiac medications before your next appointment, please call your pharmacy.

## 2017-05-13 ENCOUNTER — Encounter: Payer: Self-pay | Admitting: Cardiovascular Disease

## 2017-05-17 LAB — CUP PACEART INCLINIC DEVICE CHECK
Date Time Interrogation Session: 20190304143707
Implantable Lead Implant Date: 20060313
Implantable Lead Location: 753859
Implantable Lead Location: 753860
MDC IDC LEAD IMPLANT DT: 20060313
MDC IDC PG IMPLANT DT: 20060313
MDC IDC PG SERIAL: 1457119

## 2017-05-19 ENCOUNTER — Ambulatory Visit (INDEPENDENT_AMBULATORY_CARE_PROVIDER_SITE_OTHER): Payer: Medicare Other | Admitting: Cardiovascular Disease

## 2017-05-19 ENCOUNTER — Encounter: Payer: Self-pay | Admitting: Cardiovascular Disease

## 2017-05-19 VITALS — BP 134/71 | HR 71 | Ht 60.0 in | Wt 151.8 lb

## 2017-05-19 DIAGNOSIS — E7800 Pure hypercholesterolemia, unspecified: Secondary | ICD-10-CM

## 2017-05-19 DIAGNOSIS — I4821 Permanent atrial fibrillation: Secondary | ICD-10-CM

## 2017-05-19 DIAGNOSIS — I10 Essential (primary) hypertension: Secondary | ICD-10-CM | POA: Diagnosis not present

## 2017-05-19 DIAGNOSIS — I5042 Chronic combined systolic (congestive) and diastolic (congestive) heart failure: Secondary | ICD-10-CM

## 2017-05-19 DIAGNOSIS — Z7901 Long term (current) use of anticoagulants: Secondary | ICD-10-CM

## 2017-05-19 DIAGNOSIS — E669 Obesity, unspecified: Secondary | ICD-10-CM | POA: Diagnosis not present

## 2017-05-19 DIAGNOSIS — I428 Other cardiomyopathies: Secondary | ICD-10-CM

## 2017-05-19 DIAGNOSIS — Z950 Presence of cardiac pacemaker: Secondary | ICD-10-CM | POA: Diagnosis not present

## 2017-05-19 DIAGNOSIS — I482 Chronic atrial fibrillation: Secondary | ICD-10-CM | POA: Diagnosis not present

## 2017-05-19 DIAGNOSIS — E1169 Type 2 diabetes mellitus with other specified complication: Secondary | ICD-10-CM

## 2017-05-19 DIAGNOSIS — I442 Atrioventricular block, complete: Secondary | ICD-10-CM

## 2017-05-19 NOTE — Patient Instructions (Addendum)
Dr Royann Shivers recommends that you schedule a follow-up appointment in 6 weeks with a pacemaker check.

## 2017-05-19 NOTE — Progress Notes (Signed)
Patient ID: Denise [name], female   DOB: Aug 08, 1923, 84 y.o.   MRN: [id number]    Cardiology Office Note    Date:  05/19/2017   ID:  Denise [name], DOB 09/22/1923, MRN [id number]  PCP:  Daryl Eastern, MD  Cardiologist:  Nicki Guadalajara, MD;  Thurmon Fair, MD   Chief Complaint  Patient presents with  . Follow-up    History of Present Illness:  Denise [name] is a 84 y.o. female with complete heart block after AV node ablation for difficult to control atrial fibrillation, Chronic combined systolic diastolic heart failure who presents in follow-up.  She has a dual-chamber [employer] Identity device that was implanted in 2006 but the device is programmed VVIR since she is now in permanent atrial fibrillation. Her device is not amenable for remote monitoring. Her battery is approaching ERI, voltage 2.55V, but impedance >30 kohm.  Pacemaker interrogation today shows that her [employer]. Jude identity ADX XL DR device only has 3 months of estimated longevity. She has 98 % ventricular pacing. She does have an underlying escape rhythm today, around 38 bpm.. She has not had any recorded ventricular tachycardia. Lead sensing, impedance and threshold all remain excellent (capture 0.7 V at 0.5 ms, sensed R waves 6.8-11.6 mV, impedance 657 ohms).  Echocardiogram March 2017 showed an ejection fraction of 45-50%, mild mitral regurgitation, moderate to severe tricuspid regurgitation with an estimated systolic PA pressure 56 mmHg. Her echo in 2012 had described moderate to severe mitral regurgitation.  Repeat echo Feb 13  - Left ventricle: Wall thickness was increased in a pattern of mild LVH. Systolic function was normal. The estimated ejection fraction was in the range of 50% to 55%. The study is not technically sufficient to allow evaluation of LV diastolic function.  Aortic valve: There was mild stenosis. There was trivial regurgitation. - Mitral valve: Moderately calcified annulus. Moderately  thickened,   mildly calcified leaflets . There was mild regurgitation. - Left atrium: The atrium was severely dilated. - Right ventricle: The cavity size was mildly dilated. - Right atrium: The atrium was severely dilated. - Atrial septum: No defect or patent foramen ovale was identified. - Tricuspid valve: There was moderate regurgitation.   As before, she is only taking the diuretic twice a week on Monday and Friday.  Taking more often that causes diarrhea in her opinion.  Her son is worried that she does not take it frequently no since she has edema and she sometimes complains of dyspnea "because the weather is bad".  Denise [name] is no longer weighing herself on a daily basis, it appears the problem is she has difficulty distinguishing her exact weight with her current scales.  Past Medical History:  Diagnosis Date  . CHF (congestive heart failure) (HCC)   . Diabetes mellitus without complication (HCC)   . Hypertension   . LVH (left ventricular hypertrophy)   . Mitral regurgitation    mod to severe  . Permanent atrial fibrillation (HCC)   . Pulmonary hypertension (HCC)    severe  . Thyroid disease   . Tricuspid regurgitation    severe    Past Surgical History:  Procedure Laterality Date  . ABDOMINAL HYSTERECTOMY    . CARDIOVERSION  5 /18/ 2000   unsuccessful  . CHOLECYSTECTOMY    . COMPRESSION HIP SCREW Right 06/06/2012   Procedure: Open Reduction Internal Fixation Right HIp;  Surgeon: Dannielle Huh, MD;  Location: [facility] OR;  Service: Orthopedics;  Laterality: Right;  .  ESOPHAGOGASTRODUODENOSCOPY N/A 06/09/2015   Procedure: ESOPHAGOGASTRODUODENOSCOPY (EGD);  Surgeon: Charlott Rakes, MD;  Location: [facility] ENDOSCOPY;  Service: Endoscopy;  Laterality: N/A;  . knee replacement     R  . NM MYOCAR PERF WALL MOTION  02/28/2009   No signficant ischemia  . PACEMAKER INSERTION  05/26/2004   [employer]  . THYROIDECTOMY    . TUBAL LIGATION    . US ECHOCARDIOGRAPHY  02/25/2011   mod to severe  MR,LA severe dilated,mild annular ca+,mod. TR,mod PI,mod ca+ of the aortic valve leaflet    Current Medications: Outpatient Medications Prior to Visit  Medication Sig Dispense Refill  . amLODipine (NORVASC) 5 MG tablet TAKE 1 TABLET (5 MG TOTAL) BY MOUTH EVERY MORNING. 90 tablet 2  . atorvastatin (LIPITOR) 10 MG tablet Take 10 mg by mouth every evening.    . furosemide (LASIX) 40 MG tablet TAKE 1 TABLET (40 MG TOTAL) BY MOUTH 3 (THREE) TIMES A WEEK. MONDAYS, WEDNESDAYS, AND FRIDAYS 90 tablet 3  . hydrochlorothiazide (HYDRODIURIL) 25 MG tablet Take 0.5 tablets (12.5 mg total) by mouth every morning. (Patient taking differently: Take 25 mg by mouth every morning. ) 90 tablet 1  . isosorbide mononitrate (IMDUR) 30 MG 24 hr tablet Take 1 tablet (30 mg total) by mouth every morning. (Patient taking differently: Take 30 mg by mouth every morning. ) 90 tablet 3  . methimazole (TAPAZOLE) 5 MG tablet Take 5mg  on MWF Sun and 10mg  on T, Th, Saturday    . metoprolol succinate (TOPROL-XL) 50 MG 24 hr tablet Take 1 tablet (50 mg total) by mouth daily. Take with or immediately following a meal. 90 tablet 3  . potassium chloride SA (K-DUR,KLOR-CON) 20 MEQ tablet Take 1-2 tablets (20-40 mEq total) by mouth daily as directed 180 tablet 3  . ramipril (ALTACE) 5 MG capsule Take 1 capsule (5 mg total) by mouth daily. 30 capsule 6  . warfarin (COUMADIN) 5 MG tablet TAKE 1 TABLET BY MOUTH DAILY OR AS DIRECTED BY COUMADIN CLINIC 90 tablet 0  . furosemide (LASIX) 40 MG tablet Take 40 mg by mouth. On Monday Wednesday and Friday     No facility-administered medications prior to visit.      Allergies:   Iohexol and Shrimp [shellfish allergy]   Social History   Socioeconomic History  . Marital status: Widowed    Spouse name: None  . Number of children: None  . Years of education: None  . Highest education level: None  Social Needs  . Financial resource strain: None  . Food insecurity - worry: None  . Food  insecurity - inability: None  . Transportation needs - medical: None  . Transportation needs - non-medical: None  Occupational History  . None  Tobacco Use  . Smoking status: Never Smoker  . Smokeless tobacco: Never Used  Substance and Sexual Activity  . Alcohol use: No  . Drug use: No  . Sexual activity: No  Other Topics Concern  . None  Social History Narrative  . None     Family History:  The patient's family history includes Asthma in her mother; Cancer - Colon in her brother; Gallbladder disease in her maternal grandmother; Heart attack in her brother and father; Heart disease in her child; Stroke in her maternal grandfather and sister.   ROS:   Please see the history of present illness.    ROS All other systems reviewed and are negative.   PHYSICAL EXAM:   VS:  BP 134/71   Pulse  71   Ht 5' (1.524 m)   Wt 151 lb 12.8 oz (68.9 kg)   BMI 29.65 kg/m      General: Alert, oriented x3, no distress, mildly obese, healthy left subclavian pacemaker site Head: no evidence of trauma, PERRL, EOMI, no exophtalmos or lid lag, no myxedema, no xanthelasma; normal ears, nose and oropharynx Neck: normal jugular venous pulsations and no hepatojugular reflux; brisk carotid pulses without delay and no carotid bruits Chest: clear to auscultation, no signs of consolidation by percussion or palpation, normal fremitus, symmetrical and full respiratory excursions Cardiovascular: normal position and quality of the apical impulse, regular rhythm, normal first and paradoxically split second heart sounds, no murmurs, rubs or gallops Abdomen: no tenderness or distention, no masses by palpation, no abnormal pulsatility or arterial bruits, normal bowel sounds, no hepatosplenomegaly Extremities: no clubbing, cyanosis , there is 1+ pedal edema symmetrically; 2+ radial, ulnar and brachial pulses bilaterally; 2+ right femoral, posterior tibial and dorsalis pedis pulses; 2+ left femoral, posterior tibial and  dorsalis pedis pulses; no subclavian or femoral bruits Neurological: grossly nonfocal Psych: Normal mood and affect     Wt Readings from Last 3 Encounters:  05/19/17 151 lb 12.8 oz (68.9 kg)  05/11/17 150 lb 9.6 oz (68.3 kg)  02/17/17 159 lb (72.1 kg)      Studies/Labs Reviewed:   EKG:  EKG is ordered today.  Shows background atrial fibrillation with ventricular paced rhythm and a single non-paced beat, probably a PVC.  ASSESSMENT:    1. Chronic combined systolic and diastolic CHF, NYHA class 2 (HCC)   2. Permanent atrial fibrillation (HCC)   3. Long term (current) use of anticoagulants   4. CHB (complete heart block) (HCC)   5. Essential hypertension   6. Diabetes mellitus type 2 in obese (HCC)   7. Hypercholesterolemia   8. Pacemaker   9. Nonischemic cardiomyopathy (HCC)      PLAN:  In order of problems listed above:  1. CHF:  She has pacing-induced dyssynchrony, but does not appear to need CRT.  Suggested that she may need a new scale but that she should comply with daily weight monitoring, which will allow her to see if there is an association between certain weight in the days when she has dyspnea, thereby providing a more logical basis for her diuretic dosing 2. Permanent atrial fibrillation s/p AV node ablation. CHADSVasc 5 (age 81, gender, HF, HTN, DM). On anticoagulation. 3. Warfarin: No serious bleeding problems in the recent past.trying to keep INR 2.0-2.5 4. CHB: At previous device checks she has not always had an escape rhythm, but today she appears to have a reliable escape rhythm at about 40 bpm. 5. HTN: Well-controlled 6. DM with recent A1c 6.5%, not requiring medications.   7. HLP: On statin, excellent LDL level 8. Pacemaker: Very close to requiring battery change.  She is eager to do this as soon as possible.  We will bring him back in 6 weeks for another generator voltage/impedance check.   Medication Adjustments/Labs and Tests Ordered: Current  medicines are reviewed at length with the patient today.  Concerns regarding medicines are outlined above.  Medication changes, Labs and Tests ordered today are listed in the Patient Instructions below. Patient Instructions  Dr Royann Shivers recommends that you schedule a follow-up appointment in 6 weeks with a pacemaker check.    Signed, Thurmon Fair, MD  05/19/2017 9:12 AM    [facility] [address] [place], [place], [place]  [address] Phone: [phone]; Fax: [phone]

## 2017-05-26 LAB — CUP PACEART INCLINIC DEVICE CHECK
Date Time Interrogation Session: 20190313135115
Implantable Lead Implant Date: 20060313
Implantable Lead Location: 753860
Implantable Pulse Generator Implant Date: 20060313
Lead Channel Setting Pacing Amplitude: 2.5 V
Lead Channel Setting Pacing Pulse Width: 0.5 ms
MDC IDC LEAD IMPLANT DT: 20060313
MDC IDC LEAD LOCATION: 753859
MDC IDC SET LEADCHNL RV SENSING SENSITIVITY: 4 mV
Pulse Gen Model: 5386
Pulse Gen Serial Number: 1457119

## 2017-06-07 ENCOUNTER — Other Ambulatory Visit: Payer: Self-pay

## 2017-06-08 ENCOUNTER — Other Ambulatory Visit: Payer: Self-pay | Admitting: Pharmacist Clinician (PhC)/ Clinical Pharmacy Specialist

## 2017-06-08 MED ORDER — WARFARIN SODIUM 5 MG PO TABS: ORAL_TABLET | ORAL | 0 refills | Status: DC

## 2017-06-29 ENCOUNTER — Encounter: Payer: Self-pay | Admitting: Cardiovascular Disease

## 2017-06-29 ENCOUNTER — Ambulatory Visit (INDEPENDENT_AMBULATORY_CARE_PROVIDER_SITE_OTHER): Payer: Medicare Other | Admitting: Cardiovascular Disease

## 2017-06-29 VITALS — BP 110/56 | HR 70 | Ht 60.0 in | Wt 145.0 lb

## 2017-06-29 DIAGNOSIS — E782 Mixed hyperlipidemia: Secondary | ICD-10-CM | POA: Diagnosis not present

## 2017-06-29 DIAGNOSIS — Z950 Presence of cardiac pacemaker: Secondary | ICD-10-CM | POA: Diagnosis not present

## 2017-06-29 DIAGNOSIS — I5032 Chronic diastolic (congestive) heart failure: Secondary | ICD-10-CM | POA: Diagnosis not present

## 2017-06-29 DIAGNOSIS — I482 Chronic atrial fibrillation: Secondary | ICD-10-CM | POA: Diagnosis not present

## 2017-06-29 DIAGNOSIS — I10 Essential (primary) hypertension: Secondary | ICD-10-CM | POA: Diagnosis not present

## 2017-06-29 DIAGNOSIS — E119 Type 2 diabetes mellitus without complications: Secondary | ICD-10-CM | POA: Diagnosis not present

## 2017-06-29 DIAGNOSIS — Z7901 Long term (current) use of anticoagulants: Secondary | ICD-10-CM | POA: Diagnosis not present

## 2017-06-29 DIAGNOSIS — I4821 Permanent atrial fibrillation: Secondary | ICD-10-CM

## 2017-06-29 DIAGNOSIS — I442 Atrioventricular block, complete: Secondary | ICD-10-CM | POA: Diagnosis not present

## 2017-06-29 NOTE — Progress Notes (Signed)
Patient ID: Denise [name], female   DOB: 08/17/1923, 84 y.o.   MRN: [id number]    Cardiology Office Note    Date:  06/29/2017   ID:  Denise [name], DOB Feb 19, 1924, MRN [id number]  PCP:  Daryl Eastern, MD  Cardiologist:  Nicki Guadalajara, MD;  Thurmon Fair, MD   No chief complaint on file.   History of Present Illness:  Denise [name] is a 84 y.o. female with complete heart block after AV node ablation for difficult to control atrial fibrillation, Chronic combined systolic diastolic heart failure who presents in follow-up.  She has a dual-chamber [employer] Jude Identity device that was implanted in 2006 but the device is programmed VVIR since she is now in permanent atrial fibrillation. Her device is not amenable for remote monitoring. Her battery is approaching ERI, voltage 2.55V, but impedance >30 kohm.  Battery voltage is essentially unchanged at 2.55 V (ERI at 2.50 V) with voltage impedance of 31 kohm, magnet rate 88.3 (ERI 86.3 bpm).  Has an underlying escape rhythm checked today, at about 35 bpm.  Percent ventricular pacing.  Capture threshold and R wave sensing and lead impedance remain unchanged and within normal range.  Echocardiogram March 2017 showed an ejection fraction of 45-50%, mild mitral regurgitation, moderate to severe tricuspid regurgitation with an estimated systolic PA pressure 56 mmHg. Her echo in 2012 had described moderate to severe mitral regurgitation.  Repeat echo Feb 13  - Left ventricle: Wall thickness was increased in a pattern of mild LVH. Systolic function was normal. The estimated ejection fraction was in the range of 50% to 55%. The study is not technically sufficient to allow evaluation of LV diastolic function.  Aortic valve: There was mild stenosis. There was trivial regurgitation. - Mitral valve: Moderately calcified annulus. Moderately thickened,   mildly calcified leaflets . There was mild regurgitation. - Left atrium: The atrium was severely  dilated. - Right ventricle: The cavity size was mildly dilated. - Right atrium: The atrium was severely dilated. - Atrial septum: No defect or patent foramen ovale was identified. - Tricuspid valve: There was moderate regurgitation.   Denies interim problems with edema, shortness of breath, angina, dizziness or syncope.  She is frustrated that her pacemaker is not yet ready to be changed out of once a taking care of as quickly as possible.  Her weight is down roughly 5 pounds this year.  Past Medical History:  Diagnosis Date  . CHF (congestive heart failure) (HCC)   . Diabetes mellitus without complication (HCC)   . Hypertension   . LVH (left ventricular hypertrophy)   . Mitral regurgitation    mod to severe  . Permanent atrial fibrillation (HCC)   . Pulmonary hypertension (HCC)    severe  . Thyroid disease   . Tricuspid regurgitation    severe    Past Surgical History:  Procedure Laterality Date  . ABDOMINAL HYSTERECTOMY    . CARDIOVERSION  5 /18/ 2000   unsuccessful  . CHOLECYSTECTOMY    . COMPRESSION HIP SCREW Right 06/06/2012   Procedure: Open Reduction Internal Fixation Right HIp;  Surgeon: Dannielle Huh, MD;  Location: [facility] OR;  Service: Orthopedics;  Laterality: Right;  . ESOPHAGOGASTRODUODENOSCOPY N/A 06/09/2015   Procedure: ESOPHAGOGASTRODUODENOSCOPY (EGD);  Surgeon: Charlott Rakes, MD;  Location: [facility] ENDOSCOPY;  Service: Endoscopy;  Laterality: N/A;  . knee replacement     R  . NM MYOCAR PERF WALL MOTION  02/28/2009   No signficant ischemia  . PACEMAKER INSERTION  05/26/2004   [employer]  . THYROIDECTOMY    . TUBAL LIGATION    . US ECHOCARDIOGRAPHY  02/25/2011   mod to severe MR,LA severe dilated,mild annular ca+,mod. TR,mod PI,mod ca+ of the aortic valve leaflet    Current Medications: Outpatient Medications Prior to Visit  Medication Sig Dispense Refill  . amLODipine (NORVASC) 5 MG tablet TAKE 1 TABLET (5 MG TOTAL) BY MOUTH EVERY MORNING. 90 tablet 2  .  atorvastatin (LIPITOR) 10 MG tablet Take 10 mg by mouth every evening.    . hydrochlorothiazide (HYDRODIURIL) 25 MG tablet Take 0.5 tablets (12.5 mg total) by mouth every morning. (Patient taking differently: Take 25 mg by mouth every morning. ) 90 tablet 1  . isosorbide mononitrate (IMDUR) 30 MG 24 hr tablet Take 1 tablet (30 mg total) by mouth every morning. (Patient taking differently: Take 30 mg by mouth every morning. ) 90 tablet 3  . methimazole (TAPAZOLE) 5 MG tablet Take 5mg  on MWF Sun and 10mg  on T, Th, Saturday    . metoprolol succinate (TOPROL-XL) 50 MG 24 hr tablet Take 1 tablet (50 mg total) by mouth daily. Take with or immediately following a meal. 90 tablet 3  . potassium chloride SA (K-DUR,KLOR-CON) 20 MEQ tablet Take 1-2 tablets (20-40 mEq total) by mouth daily as directed 180 tablet 3  . ramipril (ALTACE) 5 MG capsule Take 1 capsule (5 mg total) by mouth daily. 30 capsule 6  . warfarin (COUMADIN) 5 MG tablet TAKE 1 TABLET BY MOUTH DAILY OR AS DIRECTED BY COUMADIN CLINIC 90 tablet 0  . furosemide (LASIX) 40 MG tablet TAKE 1 TABLET (40 MG TOTAL) BY MOUTH 3 (THREE) TIMES A WEEK. MONDAYS, WEDNESDAYS, AND FRIDAYS 90 tablet 3   No facility-administered medications prior to visit.      Allergies:   Iohexol and Shrimp [shellfish allergy]   Social History   Socioeconomic History  . Marital status: Widowed    Spouse name: Not on file  . Number of children: Not on file  . Years of education: Not on file  . Highest education level: Not on file  Occupational History  . Not on file  Social Needs  . Financial resource strain: Not on file  . Food insecurity:    Worry: Not on file    Inability: Not on file  . Transportation needs:    Medical: Not on file    Non-medical: Not on file  Tobacco Use  . Smoking status: Never Smoker  . Smokeless tobacco: Never Used  Substance and Sexual Activity  . Alcohol use: No  . Drug use: No  . Sexual activity: Never  Lifestyle  . Physical  activity:    Days per week: Not on file    Minutes per session: Not on file  . Stress: Not on file  Relationships  . Social connections:    Talks on phone: Not on file    Gets together: Not on file    Attends religious service: Not on file    Active member of club or organization: Not on file    Attends meetings of clubs or organizations: Not on file    Relationship status: Not on file  Other Topics Concern  . Not on file  Social History Narrative  . Not on file     Family History:  The patient's family history includes Asthma in her mother; Cancer - Colon in her brother; Gallbladder disease in her maternal grandmother; Heart attack in her brother and father;  Heart disease in her child; Stroke in her maternal grandfather and sister.   ROS:   Please see the history of present illness.    ROS All other systems reviewed and are negative.   PHYSICAL EXAM:   VS:  BP (!) 110/56   Pulse 70   Ht 5' (1.524 m)   Wt 145 lb (65.8 kg)   BMI 28.32 kg/m       General: Alert, oriented x3, no distress, elderly, appears comfortable Head: no evidence of trauma, PERRL, EOMI, no exophtalmos or lid lag, no myxedema, no xanthelasma; normal ears, nose and oropharynx Neck: normal jugular venous pulsations and no hepatojugular reflux; brisk carotid pulses without delay and no carotid bruits Chest: clear to auscultation, no signs of consolidation by percussion or palpation, normal fremitus, symmetrical and full respiratory excursions Cardiovascular: normal position and quality of the apical impulse, regular rhythm, normal first and paradoxically split second heart sounds, 2/6 early peaking systolic ejection murmur, no diastolic murmurs, rubs or gallops.  Healthy pacemaker site left subclavian area Abdomen: no tenderness or distention, no masses by palpation, no abnormal pulsatility or arterial bruits, normal bowel sounds, no hepatosplenomegaly Extremities: no clubbing, cyanosis or edema; 2+ radial,  ulnar and brachial pulses bilaterally; 2+ right femoral, posterior tibial and dorsalis pedis pulses; 2+ left femoral, posterior tibial and dorsalis pedis pulses; no subclavian or femoral bruits Neurological: grossly nonfocal Psych: Normal mood and affect      Wt Readings from Last 3 Encounters:  06/29/17 145 lb (65.8 kg)  05/19/17 151 lb 12.8 oz (68.9 kg)  05/11/17 150 lb 9.6 oz (68.3 kg)      Studies/Labs Reviewed:   EKG:  EKG is ordered today.  Atrial fibrillation with ventricular paced rhythm, QTC 496 ms  ASSESSMENT:    1. Chronic diastolic heart failure (HCC)   2. Permanent atrial fibrillation (HCC)   3. Long term current use of anticoagulant   4. Complete heart block (HCC)   5. Essential hypertension   6. Type 2 diabetes mellitus without complication, without long-term current use of insulin (HCC)   7. Mixed hyperlipidemia   8. Pacemaker      PLAN:  In order of problems listed above:  1. CHF: Very sedentary and hard to assess her functional status.  Volume status appears normal and she weighs 5 pounds less than earlier this year.  She takes her diuretic on an intermittent basis.  EF is essentially normal and she does not require CRT even though she paces 100% of the time. 2. Permanent atrial fibrillation s/p AV node ablation, does have a fairly reliable escape rhythm at about 35 bpm. CHADSVasc 5 (age 33, gender, HF, HTN, DM). On anticoagulation. 3. Warfarin: Tolerated without serious bleeding 4. CHB: Ventricular escape rhythm at about 35 bpm. 5. HTN: Excellent control 6. DM hemoglobin A1c in target range without medications 7. HLP: On statin, excellent LDL level 8. Pacemaker: Very close to requiring battery change.  She is eager to do this as soon as possible.  We will bring back to device clinic in 4-6 weeks for another generator voltage/impedance check.   Medication Adjustments/Labs and Tests Ordered: Current medicines are reviewed at length with the patient today.   Concerns regarding medicines are outlined above.  Medication changes, Labs and Tests ordered today are listed in the Patient Instructions below. There are no Patient Instructions on file for this visit.   Signed, Thurmon Fair, MD  06/29/2017 11:10 AM    [facility]  [address], [place], [place]  [address] Phone: [phone]; Fax: [phone]

## 2017-06-29 NOTE — Patient Instructions (Signed)
Dr Royann Shivers recommends that you schedule a follow-up appointment first available with a pacemaker check.  Please follow-up with our [facility] device clinic in May for a battery check. An appointment has already been made for you.  If you need a refill on your cardiac medications before your next appointment, please call your pharmacy.

## 2017-07-06 ENCOUNTER — Other Ambulatory Visit: Payer: Self-pay | Admitting: *Deleted

## 2017-07-06 MED ORDER — RAMIPRIL 5 MG PO CAPS: 5.0000 mg | ORAL_CAPSULE | Freq: Every day | ORAL | 6 refills | Status: DC

## 2017-07-29 ENCOUNTER — Ambulatory Visit (INDEPENDENT_AMBULATORY_CARE_PROVIDER_SITE_OTHER): Payer: Self-pay | Admitting: *Deleted

## 2017-07-29 DIAGNOSIS — I442 Atrioventricular block, complete: Secondary | ICD-10-CM

## 2017-07-29 NOTE — Progress Notes (Signed)
Device check for battery, device reached ERI pt and pts son informed. Msg sent to Dr. Salena Saner and Marylene Land Truitt to schedule gen change.

## 2017-07-30 ENCOUNTER — Telehealth: Payer: Self-pay

## 2017-07-30 NOTE — Telephone Encounter (Signed)
-----   Message from Thurmon Fair, MD sent at 07/29/2017 11:36 AM EDT ----- We can just schedule the changeout, we've gone over the procedure and consent several times.Thanks! MCr ----- Message ----- From: Dorathy Daft, RN Sent: 07/29/2017  11:15 AM To: Thurmon Fair, MD, Neta Ehlers, CMA  Ms. [facility] pacemaker has reached ERI. Does she need another apt w/ Dr. Salena Saner to discuss risks and benefits? Pt's son stated that Dr. Salena Saner had went over that at last OV and he didn't seem to keen on coming back for another OV they want to just go ahead and schedule the generator change out.   Thank You Azalia Bilis

## 2017-08-02 NOTE — Telephone Encounter (Signed)
Follow up   Patient calling to discuss dates for generator change out.

## 2017-08-04 NOTE — Telephone Encounter (Signed)
Patient is scheduled for Tuesday, June 4th, 2019 at 1:30 p with Dr Royann Shivers.  Instruction letter mailed to patient:  DEVICE IMPLANT PATIENT INSTRUCTIONS PATIENT NAME: Denise [name] PROCEDURE: Ambulance person DATE: Tuesday, June 4th, 2019 ARRIVAL TIME: 10:30-11:00a  Please arrive to the Short Stay Center located at [address] [employer] - Entrance "A." [facility] parking service is available.   You may eat an early, light breakfast but nothing to eat after 8:00 am.   Take your morning medications like you normally do with a sip of water.  You will need to have blood work done at the hospital when you arrive.  Plan for an overnight stay, bring your insurance cards, and a current list of your medications.  Wash your chest and neck with the surgical scrub provided or any brand of anti-bacterial soap the evening before and the morning of your procedure. Rinse well.  If you have any questions after you get home, please call the office at [phone].  Thank you, Dr Rachelle Hora Croitoru *Special Note: Every effort is made to have your procedure done on time. Occasionally there are emergencies that present themselves at the hospital that may cause delays. Please be patient if a delay does occur.

## 2017-08-10 ENCOUNTER — Other Ambulatory Visit: Payer: Self-pay | Admitting: *Deleted

## 2017-08-10 DIAGNOSIS — Z45010 Encounter for checking and testing of cardiac pacemaker pulse generator [battery]: Secondary | ICD-10-CM

## 2017-08-15 DIAGNOSIS — Z45010 Encounter for checking and testing of cardiac pacemaker pulse generator [battery]: Secondary | ICD-10-CM

## 2017-08-17 ENCOUNTER — Ambulatory Visit (HOSPITAL_COMMUNITY)
Admission: RE | Admit: 2017-08-17 | Discharge: 2017-08-17 | Disposition: A | Discharge status: Home / Self Care | Payer: Medicare Other | Source: Ambulatory Visit | Attending: Cardiovascular Disease | Admitting: Cardiovascular Disease

## 2017-08-17 ENCOUNTER — Ambulatory Visit (HOSPITAL_COMMUNITY)
Admission: RE | Disposition: A | Discharge status: Home / Self Care | Payer: Self-pay | Source: Ambulatory Visit | Attending: Cardiovascular Disease

## 2017-08-17 DIAGNOSIS — E119 Type 2 diabetes mellitus without complications: Secondary | ICD-10-CM | POA: Diagnosis not present

## 2017-08-17 DIAGNOSIS — Z8249 Family history of ischemic heart disease and other diseases of the circulatory system: Secondary | ICD-10-CM | POA: Insufficient documentation

## 2017-08-17 DIAGNOSIS — I110 Hypertensive heart disease with heart failure: Secondary | ICD-10-CM | POA: Diagnosis not present

## 2017-08-17 DIAGNOSIS — I272 Pulmonary hypertension, unspecified: Secondary | ICD-10-CM | POA: Insufficient documentation

## 2017-08-17 DIAGNOSIS — I482 Chronic atrial fibrillation: Secondary | ICD-10-CM | POA: Diagnosis not present

## 2017-08-17 DIAGNOSIS — E876 Hypokalemia: Secondary | ICD-10-CM | POA: Diagnosis not present

## 2017-08-17 DIAGNOSIS — I442 Atrioventricular block, complete: Secondary | ICD-10-CM | POA: Diagnosis not present

## 2017-08-17 DIAGNOSIS — Z7901 Long term (current) use of anticoagulants: Secondary | ICD-10-CM | POA: Diagnosis not present

## 2017-08-17 DIAGNOSIS — I4891 Unspecified atrial fibrillation: Secondary | ICD-10-CM | POA: Diagnosis present

## 2017-08-17 DIAGNOSIS — I5042 Chronic combined systolic (congestive) and diastolic (congestive) heart failure: Secondary | ICD-10-CM | POA: Insufficient documentation

## 2017-08-17 DIAGNOSIS — E079 Disorder of thyroid, unspecified: Secondary | ICD-10-CM | POA: Insufficient documentation

## 2017-08-17 DIAGNOSIS — Z45010 Encounter for checking and testing of cardiac pacemaker pulse generator [battery]: Secondary | ICD-10-CM

## 2017-08-17 DIAGNOSIS — I081 Rheumatic disorders of both mitral and tricuspid valves: Secondary | ICD-10-CM | POA: Insufficient documentation

## 2017-08-17 HISTORY — PX: PPM GENERATOR CHANGEOUT: EP1233

## 2017-08-17 LAB — CBC
HCT: 40.5 % (ref 36.0–46.0)
Hemoglobin: 12.6 g/dL (ref 12.0–15.0)
MCH: 27.6 pg (ref 26.0–34.0)
MCHC: 31.1 g/dL (ref 30.0–36.0)
MCV: 88.6 fL (ref 78.0–100.0)
PLATELETS: 167 10*3/uL (ref 150–400)
RBC: 4.57 MIL/uL (ref 3.87–5.11)
RDW: 15.9 % — AB (ref 11.5–15.5)
WBC: 6.9 10*3/uL (ref 4.0–10.5)

## 2017-08-17 LAB — PROTIME-INR
INR: 1.74
Prothrombin Time: 20.2 seconds — ABNORMAL HIGH (ref 11.4–15.2)

## 2017-08-17 LAB — BASIC METABOLIC PANEL
Anion gap: 6 (ref 5–15)
BUN: 24 mg/dL — AB (ref 6–20)
CHLORIDE: 105 mmol/L (ref 101–111)
CO2: 31 mmol/L (ref 22–32)
CREATININE: 1.28 mg/dL — AB (ref 0.44–1.00)
Calcium: 9.4 mg/dL (ref 8.9–10.3)
GFR calc Af Amer: 41 mL/min — ABNORMAL LOW (ref >60)
GFR calc non Af Amer: 35 mL/min — ABNORMAL LOW (ref >60)
Glucose, Bld: 118 mg/dL — ABNORMAL HIGH (ref 65–99)
Potassium: 3 mmol/L — ABNORMAL LOW (ref 3.5–5.1)
SODIUM: 142 mmol/L (ref 135–145)

## 2017-08-17 LAB — SURGICAL PCR SCREEN
MRSA, PCR: NEGATIVE
STAPHYLOCOCCUS AUREUS: NEGATIVE

## 2017-08-17 LAB — GLUCOSE, CAPILLARY: Glucose-Capillary: 120 mg/dL — ABNORMAL HIGH (ref 65–99)

## 2017-08-17 SURGERY — PPM GENERATOR CHANGEOUT

## 2017-08-17 MED ORDER — LIDOCAINE HCL (PF) 1 % IJ SOLN
INTRAMUSCULAR | Status: DC | PRN
  Administered 2017-08-17: 25 mL via INTRADERMAL

## 2017-08-17 MED ORDER — SODIUM CHLORIDE 0.9 % IV SOLN
INTRAVENOUS | Status: DC
  Administered 2017-08-17: 11:00:00 via INTRAVENOUS

## 2017-08-17 MED ORDER — ONDANSETRON HCL 4 MG/2ML IJ SOLN: 4.0000 mg | Freq: Four times a day (QID) | INTRAMUSCULAR | Status: DC | PRN

## 2017-08-17 MED ORDER — HEPARIN (PORCINE) IN NACL 1000-0.9 UT/500ML-% IV SOLN
INTRAVENOUS | Status: AC
  Filled 2017-08-17: qty 500

## 2017-08-17 MED ORDER — MUPIROCIN 2 % EX OINT
TOPICAL_OINTMENT | CUTANEOUS | Status: AC
  Administered 2017-08-17: 1
  Filled 2017-08-17: qty 22

## 2017-08-17 MED ORDER — CEFAZOLIN SODIUM-DEXTROSE 2-4 GM/100ML-% IV SOLN
INTRAVENOUS | Status: AC
  Filled 2017-08-17: qty 100

## 2017-08-17 MED ORDER — CEFAZOLIN SODIUM-DEXTROSE 2-4 GM/100ML-% IV SOLN
2.0000 g | INTRAVENOUS | Status: AC
  Administered 2017-08-17: 2 g via INTRAVENOUS

## 2017-08-17 MED ORDER — CHLORHEXIDINE GLUCONATE 4 % EX LIQD
60.0000 mL | Freq: Once | CUTANEOUS | Status: DC
  Filled 2017-08-17: qty 60

## 2017-08-17 MED ORDER — ACETAMINOPHEN 325 MG PO TABS: 325.0000 mg | ORAL_TABLET | ORAL | Status: DC | PRN

## 2017-08-17 MED ORDER — POTASSIUM CHLORIDE CRYS ER 20 MEQ PO TBCR: 40.0000 meq | EXTENDED_RELEASE_TABLET | Freq: Once | ORAL | Status: DC

## 2017-08-17 MED ORDER — SODIUM CHLORIDE 0.9 % IV SOLN
INTRAVENOUS | Status: AC
  Filled 2017-08-17: qty 2

## 2017-08-17 MED ORDER — LIDOCAINE HCL (PF) 1 % IJ SOLN
INTRAMUSCULAR | Status: AC
  Filled 2017-08-17: qty 60

## 2017-08-17 MED ORDER — SODIUM CHLORIDE 0.9 % IV SOLN
80.0000 mg | INTRAVENOUS | Status: AC
  Administered 2017-08-17: 80 mg

## 2017-08-17 SURGICAL SUPPLY — 4 items
CABLE SURGICAL S-101-97-12 (CABLE) ×2 IMPLANT
ELECT DEFIB PAD ADLT CADENCE (PAD) ×2 IMPLANT
PACEMAKER ASSURITY DR-RF (Pacemaker) ×2 IMPLANT
TRAY PACEMAKER INSERTION (PACKS) ×2 IMPLANT

## 2017-08-17 NOTE — Discharge Instructions (Signed)
Supplemental Discharge Instructions for  °Pacemaker/Defibrillator Patients ° °Activity ° DO wear your seatbelt, even if it crosses over the pacemaker site. ° °WOUND CARE °- Keep the wound area clean and dry.  Remove the dressing the day after you return home (usually 48 hours after the procedure). °- DO NOT SUBMERGE UNDER WATER UNTIL FULLY HEALED (no tub baths, hot tubs, swimming pools, etc.).  °- You  may shower or take a sponge bath after the dressing is removed. DO NOT SOAK the area and do not allow the shower to directly spray on the site. °- If you have staples, these will be removed in the office in 7-14 days. °- If you have tape/steri-strips on your wound, these will fall off; do not pull them off prematurely.   °- No bandage is needed on the site.  DO  NOT apply any creams, oils, or ointments to the wound area. °- If you notice any drainage or discharge from the wound, any swelling, excessive redness or bruising at the site, or if you develop a fever > 101? F after you are discharged home, call the office at once. ° °Special Instructions °- You are still able to use cellular telephones.  Avoid carrying your cellular phone near your device. °- When traveling through airports, show security personnel your identification card to avoid being screened in the metal detectors.  °- Avoid arc welding equipment, MRI testing (magnetic resonance imaging), TENS units (transcutaneous nerve stimulators).  Call the office for questions about other devices. °- Avoid electrical appliances that are in poor condition or are not properly grounded. °- Microwave ovens are safe to be near or to operate. ° ° ° ° °

## 2017-08-17 NOTE — H&P (Signed)
Cardiology Admission History and Physical:   Patient ID: Denise [name]; MRN: [id number]; DOB: Dec 21, 1923   Admission date: 08/17/2017  Primary Care Provider: Daryl Eastern, MD Primary Cardiologist: Niquita Digioia Primary Electrophysiologist:  n/a  Chief Complaint:  Pacemaker battery depletion  Patient Profile:   Denise [name] is a 84 y.o. female with a history of complete heart block after AV node ablation for difficult to control atrial fibrillation, Chronic combined systolic diastolic heart failure who presents for pacemaker generator changeout (ERI).   History of Present Illness:   Denise [name] has a dual-chamber St. Jude Identity device that was implanted in 2006 but the device is programmed VVIR since she is now in permanent atrial fibrillation. Her pacemaker reached ERI last month.  The patient specifically denies any chest pain at rest exertion, dyspnea at rest or with exertion, orthopnea, paroxysmal nocturnal dyspnea, syncope, palpitations, focal neurological deficits, intermittent claudication, lower extremity edema, unexplained weight gain, cough, hemoptysis or wheezing.  echo Feb 13  - Left ventricle: Wall thickness was increased in a pattern of mildLVH. Systolic function was normal. The estimated ejectionfraction was in the range of 50% to 55%. The study is nottechnically sufficient to allow evaluation of LV diastolicfunction.  Aortic valve: There was mild stenosis. There was trivialregurgitation. - Mitral valve: Moderately calcified annulus. Moderately thickened, mildly calcified leaflets . There was mild regurgitation. - Left atrium: The atrium was severely dilated. - Right ventricle: The cavity size was mildly dilated. - Right atrium: The atrium was severely dilated. - Atrial septum: No defect or patent foramen ovale was identified. - Tricuspid valve: There was moderate regurgitation.   Past Medical History:  Diagnosis Date  . CHF (congestive heart  failure) (HCC)   . Diabetes mellitus without complication (HCC)   . Hypertension   . LVH (left ventricular hypertrophy)   . Mitral regurgitation    mod to severe  . Permanent atrial fibrillation (HCC)   . Pulmonary hypertension (HCC)    severe  . Thyroid disease   . Tricuspid regurgitation    severe    Past Surgical History:  Procedure Laterality Date  . ABDOMINAL HYSTERECTOMY    . CARDIOVERSION  5 /18/ 2000   unsuccessful  . CHOLECYSTECTOMY    . COMPRESSION HIP SCREW Right 06/06/2012   Procedure: Open Reduction Internal Fixation Right HIp;  Surgeon: Dannielle Huh, MD;  Location: [facility] OR;  Service: Orthopedics;  Laterality: Right;  . ESOPHAGOGASTRODUODENOSCOPY N/A 06/09/2015   Procedure: ESOPHAGOGASTRODUODENOSCOPY (EGD);  Surgeon: Charlott Rakes, MD;  Location: [facility] ENDOSCOPY;  Service: Endoscopy;  Laterality: N/A;  . knee replacement     R  . NM MYOCAR PERF WALL MOTION  02/28/2009   No signficant ischemia  . PACEMAKER INSERTION  05/26/2004   St.Jude  . THYROIDECTOMY    . TUBAL LIGATION    . US ECHOCARDIOGRAPHY  02/25/2011   mod to severe MR,LA severe dilated,mild annular ca+,mod. TR,mod PI,mod ca+ of the aortic valve leaflet     Medications Prior to Admission: Prior to Admission medications   Medication Sig Start Date End Date Taking? Authorizing Provider  amLODipine (NORVASC) 5 MG tablet TAKE 1 TABLET (5 MG TOTAL) BY MOUTH EVERY MORNING. 03/15/17  Yes Lennette Bihari, MD  atorvastatin (LIPITOR) 10 MG tablet Take 10 mg by mouth every evening.   Yes [provider]  furosemide (LASIX) 40 MG tablet TAKE 1 TABLET (40 MG TOTAL) BY MOUTH 3 (THREE) TIMES A WEEK. MONDAYS, WEDNESDAYS, AND FRIDAYS Patient taking differently: Take 40  mg by mouth every Monday, Wednesday, and Friday.  03/15/17 08/11/17 Yes Lennette Bihari, MD  hydrochlorothiazide (HYDRODIURIL) 25 MG tablet Take 0.5 tablets (12.5 mg total) by mouth every morning. Patient taking differently: Take 25 mg by mouth every  morning.  03/31/16  Yes Lennette Bihari, MD  isosorbide mononitrate (IMDUR) 30 MG 24 hr tablet Take 1 tablet (30 mg total) by mouth every morning. Patient taking differently: Take 15 mg by mouth every morning.  03/29/17  Yes Ima Hafner, MD  methimazole (TAPAZOLE) 10 MG tablet Take 5 mg by mouth daily. 07/28/17  Yes [provider]  metoprolol succinate (TOPROL-XL) 50 MG 24 hr tablet Take 1 tablet (50 mg total) by mouth daily. Take with or immediately following a meal. 07/30/16  Yes Lennette Bihari, MD  potassium chloride SA (K-DUR,KLOR-CON) 20 MEQ tablet Take 1-2 tablets (20-40 mEq total) by mouth daily as directed Patient taking differently: Take 20-40 mEq by mouth See admin instructions. Take 40 meq by mouth on Monday, Wednesday and Friday. Take 10 meq by mouth daily on all other days 04/12/17  Yes Elara Cocke, MD  ramipril (ALTACE) 5 MG capsule Take 1 capsule (5 mg total) by mouth daily. 07/06/17  Yes Lennette Bihari, MD  warfarin (COUMADIN) 5 MG tablet TAKE 1 TABLET BY MOUTH DAILY OR AS DIRECTED BY COUMADIN CLINIC Patient taking differently: Take 5 mg by mouth daily.  06/08/17  Yes Lennette Bihari, MD     Allergies:    Allergies  Allergen Reactions  . Iohexol Rash and Other (See Comments)     Desc: VOMITING-VERIFIED ON 05-24-04 PRE-PROCEDURE/ ARS   . Shrimp [Shellfish Allergy] Nausea And Vomiting and Rash    Social History:   Social History   Socioeconomic History  . Marital status: Widowed    Spouse name: Not on file  . Number of children: Not on file  . Years of education: Not on file  . Highest education level: Not on file  Occupational History  . Not on file  Social Needs  . Financial resource strain: Not on file  . Food insecurity:    Worry: Not on file    Inability: Not on file  . Transportation needs:    Medical: Not on file    Non-medical: Not on file  Tobacco Use  . Smoking status: Never Smoker  . Smokeless tobacco: Never Used  Substance and Sexual  Activity  . Alcohol use: No  . Drug use: No  . Sexual activity: Never  Lifestyle  . Physical activity:    Days per week: Not on file    Minutes per session: Not on file  . Stress: Not on file  Relationships  . Social connections:    Talks on phone: Not on file    Gets together: Not on file    Attends religious service: Not on file    Active member of club or organization: Not on file    Attends meetings of clubs or organizations: Not on file    Relationship status: Not on file  . Intimate partner violence:    Fear of current or ex partner: Not on file    Emotionally abused: Not on file    Physically abused: Not on file    Forced sexual activity: Not on file  Other Topics Concern  . Not on file  Social History Narrative  . Not on file    Family History:   The patient's family history includes Asthma in  her mother; Cancer - Colon in her brother; Gallbladder disease in her maternal grandmother; Heart attack in her brother and father; Heart disease in her child; Stroke in her maternal grandfather and sister.    ROS:  Please see the history of present illness.  All other ROS reviewed and negative.     Physical Exam/Data:   Vitals:   08/17/17 0954  BP: 132/63  Pulse: 62  Temp: 98.5 F (36.9 C)  TempSrc: Oral  SpO2: 97%  Weight: 148 lb (67.1 kg)  Height: 5' (1.524 m)   No intake or output data in the 24 hours ending 08/17/17 1231 Filed Weights   08/17/17 0954  Weight: 148 lb (67.1 kg)   Body mass index is 28.9 kg/m.  General:  Well nourished, well developed, in no acute distress HEENT: normal Lymph: no adenopathy Neck: no JVD Endocrine:  No thryomegaly Vascular: No carotid bruits; FA pulses 2+ bilaterally without bruits  Cardiac:  normal first and paradoxically split second heart sounds, 2/6 early peaking systolic ejection murmur, no diastolic murmurs, rubs or gallops.  Healthy pacemaker site left subclavian area Lungs:  clear to auscultation bilaterally, no  wheezing, rhonchi or rales  Abd: soft, nontender, no hepatomegaly  Ext: no edema Musculoskeletal:  No deformities, BUE and BLE strength normal and equal Skin: warm and dry  Neuro:  CNs 2-12 intact, no focal abnormalities noted Psych:  Normal affect    EKG:  The ECG that was done 06/25/2017 was personally reviewed and demonstrates atrial fibrillation, 100% V paced  Relevant CV Studies: echo Feb 13  - Left ventricle: Wall thickness was increased in a pattern of mildLVH. Systolic function was normal. The estimated ejectionfraction was in the range of 50% to 55%. The study is nottechnically sufficient to allow evaluation of LV diastolicfunction.  Aortic valve: There was mild stenosis. There was trivialregurgitation. - Mitral valve: Moderately calcified annulus. Moderately thickened, mildly calcified leaflets . There was mild regurgitation. - Left atrium: The atrium was severely dilated. - Right ventricle: The cavity size was mildly dilated. - Right atrium: The atrium was severely dilated. - Atrial septum: No defect or patent foramen ovale was identified. - Tricuspid valve: There was moderate regurgitation.   Laboratory Data:  Chemistry Recent Labs  Lab 08/17/17 1033  NA 142  K 3.0*  CL 105  CO2 31  GLUCOSE 118*  BUN 24*  CREATININE 1.28*  CALCIUM 9.4  GFRNONAA 35*  GFRAA 41*  ANIONGAP 6    No results for input(s): PROT, ALBUMIN, AST, ALT, ALKPHOS, BILITOT in the last 168 hours. Hematology Recent Labs  Lab 08/17/17 1033  WBC 6.9  RBC 4.57  HGB 12.6  HCT 40.5  MCV 88.6  MCH 27.6  MCHC 31.1  RDW 15.9*  PLT 167   Cardiac EnzymesNo results for input(s): TROPONINI in the last 168 hours. No results for input(s): TROPIPOC in the last 168 hours.  BNPNo results for input(s): BNP, PROBNP in the last 168 hours.  DDimer No results for input(s): DDIMER in the last 168 hours.  Radiology/Studies:  No results found.  Assessment and Plan:   1. CHB s/p AV node  ablation: pacemaker dependent, but usually has an idioventricular escape at around 35 bpm. 2. PM at ERI:  [employer] with a remote monitoring compatible device. Leads are 1488, not MRI compatible. 3. Permanent atrial fibrillation:  On warfarin; she held it for 2 days and INR is 1.7. Plan to resume tonight if no bleeding. CHADSVasc 5 (age 16,  gender, HF, HTN, DM).  4. Hypokalemia: secondary to diuretiics. Will give additional 40 mEq KCl today.   For questions or updates, please contact [facility] Please consult [email or web address] for contact info under Cardiology/STEMI.    Signed, Thurmon Fair, MD  08/17/2017 12:31 PM

## 2017-08-17 NOTE — Op Note (Signed)
Procedure report  Procedure performed:  Dual chamber pacemaker generator changeout  Reason for procedure:  Device generator at elective replacement interval  Procedure performed by:  Thurmon Fair, MD  Complications:  None  Estimated blood loss:  <5 mL  Medications administered during procedure:  Ancef 2 g intravenously, lidocaine 1% 30 mL locally Device details:   [employer]. Jude [employer]  model number [id number], serial number [id number] Right atrial lead (chronic) St. Jude , model number [id number], serial [id number] (implanted 05/26/2004) Right ventricular lead (chronic)  St. Jude, model number [id number], serial number [id number] (implanted 05/26/2004)  Explanted generator St. Jude [id number]  Procedure details:  After the risks and benefits of the procedure were discussed the patient provided informed consent. She was brought to the cardiac catheter lab in the fasting state. The patient was prepped and draped in usual sterile fashion. Local anesthesia with 1% lidocaine was administered to to the left infraclavicular area. A 5-6cm horizontal incision was made parallel with and 2-3 cm caudal to the left clavicle, in the area of an old scar. An older scar was seen closer to the left clavicle. Using minimal electrocautery and mostly sharp and blunt dissection the prepectoral pocket was opened carefully to avoid injury to the loops of chronic leads. Extensive dissection was not necessary. The device was explanted. The pocket was carefully inspected for hemostasis and flushed with copious amounts of antibiotic solution.  The leads were disconnected from the old generator and testing of the lead parameters later showed excellent values. The new generator was connected to the chronic leads, with appropriate pacing noted.   The entire system was then carefully inserted in the pocket with care been taking that the leads and device assumed a comfortable position without pressure on the incision.  Great care was taken that the leads be located deep to the generator. The pocket was then closed in layers using 2 layers of 2-0 Vicryl and cutaneous staples after which a sterile dressing was applied.   At the end of the procedure the following lead parameters were encountered:  Right ventricular lead sensed R waves  NONE, impedance 490 ohms, threshold 0.8 at 0.5 ms pulse width.  Thurmon Fair, MD, [facility] [phone] office [phone] pager

## 2017-08-18 ENCOUNTER — Encounter (HOSPITAL_COMMUNITY): Payer: Self-pay | Admitting: Cardiovascular Disease

## 2017-08-18 MED FILL — Heparin Sod (Porcine)-NaCl IV Soln 1000 Unit/500ML-0.9%: INTRAVENOUS | Qty: 500 | Status: AC

## 2017-08-23 ENCOUNTER — Encounter: Payer: Medicare Other | Admitting: Cardiovascular Disease

## 2017-08-24 ENCOUNTER — Other Ambulatory Visit: Payer: Self-pay | Admitting: *Deleted

## 2017-08-24 MED ORDER — METOPROLOL SUCCINATE ER 50 MG PO TB24: 50.0000 mg | ORAL_TABLET | Freq: Every day | ORAL | 3 refills | Status: DC

## 2017-08-31 ENCOUNTER — Ambulatory Visit (INDEPENDENT_AMBULATORY_CARE_PROVIDER_SITE_OTHER): Payer: Medicare Other | Admitting: *Deleted

## 2017-08-31 DIAGNOSIS — I4821 Permanent atrial fibrillation: Secondary | ICD-10-CM

## 2017-08-31 DIAGNOSIS — I442 Atrioventricular block, complete: Secondary | ICD-10-CM | POA: Diagnosis not present

## 2017-08-31 LAB — CUP PACEART INCLINIC DEVICE CHECK
Battery Remaining Longevity: 135 mo
Brady Statistic RV Percent Paced: 99 %
Date Time Interrogation Session: 20190618141924
Implantable Lead Implant Date: 20060313
Implantable Lead Location: 753859
Implantable Lead Location: 753860
Implantable Pulse Generator Implant Date: 20190604
Lead Channel Impedance Value: 600 Ohm
Lead Channel Pacing Threshold Amplitude: 0.5 V
Lead Channel Pacing Threshold Pulse Width: 0.5 ms
Lead Channel Setting Sensing Sensitivity: 4 mV
MDC IDC LEAD IMPLANT DT: 20060313
MDC IDC MSMT BATTERY VOLTAGE: 3.11 V
MDC IDC MSMT LEADCHNL RV PACING THRESHOLD AMPLITUDE: 0.5 V
MDC IDC MSMT LEADCHNL RV PACING THRESHOLD PULSEWIDTH: 0.5 ms
MDC IDC MSMT LEADCHNL RV SENSING INTR AMPL: 11.1 mV
MDC IDC PG SERIAL: 9029685
MDC IDC SET LEADCHNL RV PACING AMPLITUDE: 1.125
MDC IDC SET LEADCHNL RV PACING PULSEWIDTH: 0.5 ms
MDC IDC STAT BRADY RA PERCENT PACED: 0 %
Pulse Gen Model: 2272

## 2017-08-31 NOTE — Progress Notes (Signed)
Wound check appointment s/p pacemaker generator replacement 08/17/17. Staples removed. Wound without redness or edema. Incision edges approximated, wound well healed. Normal device function. Thresholds, sensing, and impedances consistent with implant measurements. Histogram distribution appropriate for patient and level of activity. No high ventricular rates noted. Patient educated about wound care and Merlin monitoring. Ms. Brenden son Keyuana Senft) reports limited cellular signal, no internet and a digital landline. He is planning to purchase a "booster" for cellular service in order to help with monitor connectivity (last updated 08/17/17)- I will check with [employer] representative about whether this is a good option and contact Mr. You tomorrow. ROV with [facility] 09/22/17.

## 2017-09-04 ENCOUNTER — Other Ambulatory Visit: Payer: Self-pay | Admitting: Cardiovascular Disease

## 2017-09-21 ENCOUNTER — Telehealth: Payer: Self-pay | Admitting: *Deleted

## 2017-09-21 NOTE — Telephone Encounter (Signed)
Following up with Karie Soda (son) about cell booster for home ppm Merlin monitor. Donnald Garre ([employer]) recommends calling [employer] tech support and moving monitor around home while on the phone (they can "ping" the monitor) and suggest an optimal location for the monitor.  No answer on either number, no VM set up or VM full. In-office check tomorrow with Dr. Mitchell Heir at 9:40am. Industry aware to make patient/son aware of recommendation.

## 2017-09-22 ENCOUNTER — Encounter: Payer: Self-pay | Admitting: Cardiovascular Disease

## 2017-09-22 ENCOUNTER — Ambulatory Visit (INDEPENDENT_AMBULATORY_CARE_PROVIDER_SITE_OTHER): Payer: Medicare Other | Admitting: Cardiovascular Disease

## 2017-09-22 ENCOUNTER — Ambulatory Visit (INDEPENDENT_AMBULATORY_CARE_PROVIDER_SITE_OTHER): Payer: Medicare Other | Admitting: Pharmacist Clinician (PhC)/ Clinical Pharmacy Specialist

## 2017-09-22 VITALS — BP 143/77 | HR 72 | Ht 60.0 in | Wt 156.4 lb

## 2017-09-22 DIAGNOSIS — Z950 Presence of cardiac pacemaker: Secondary | ICD-10-CM | POA: Diagnosis not present

## 2017-09-22 DIAGNOSIS — I442 Atrioventricular block, complete: Secondary | ICD-10-CM

## 2017-09-22 DIAGNOSIS — E7800 Pure hypercholesterolemia, unspecified: Secondary | ICD-10-CM

## 2017-09-22 DIAGNOSIS — I5032 Chronic diastolic (congestive) heart failure: Secondary | ICD-10-CM | POA: Diagnosis not present

## 2017-09-22 DIAGNOSIS — Z7901 Long term (current) use of anticoagulants: Secondary | ICD-10-CM

## 2017-09-22 DIAGNOSIS — E119 Type 2 diabetes mellitus without complications: Secondary | ICD-10-CM

## 2017-09-22 DIAGNOSIS — I482 Chronic atrial fibrillation: Secondary | ICD-10-CM

## 2017-09-22 DIAGNOSIS — E1169 Type 2 diabetes mellitus with other specified complication: Secondary | ICD-10-CM

## 2017-09-22 DIAGNOSIS — E669 Obesity, unspecified: Secondary | ICD-10-CM

## 2017-09-22 DIAGNOSIS — I4821 Permanent atrial fibrillation: Secondary | ICD-10-CM

## 2017-09-22 DIAGNOSIS — I4891 Unspecified atrial fibrillation: Secondary | ICD-10-CM | POA: Diagnosis not present

## 2017-09-22 DIAGNOSIS — I10 Essential (primary) hypertension: Secondary | ICD-10-CM

## 2017-09-22 LAB — POCT INR: INR: 3.2 — AB (ref 2.0–3.0)

## 2017-09-22 NOTE — Progress Notes (Signed)
Patient ID: Denise [name], female   DOB: [birth year], 83 y.o.   MRN: [id number]    Cardiology Office Note    Date:  09/24/2017   ID:  RAYELYNN LOYAL, DOB Aug 09, 1923, MRN [id number]  PCP:  Daryl Eastern, MD  Cardiologist:  Nicki Guadalajara, MD;  Thurmon Fair, MD   Chief Complaint  Patient presents with  . Congestive Heart Failure  . Pacemaker Check    History of Present Illness:  ABBIGAYLE TOOLE is a 84 y.o. female with complete heart block after AV node ablation for difficult to control atrial fibrillation, Chronic combined systolic diastolic heart failure who presents in follow-up after a generator change out.  She has a dual-chamber St. Jude Identity device that was implanted in 2006 and this was replaced with an Assurity MRI device in June 2019, programmed VVIR for permanent atrial fibrillation.  She is pacemaker dependent.  The pacemaker is working well.  Estimated generator longevity is around 11 years.  Lead parameters remain stable.  She has not had any ventricular tachyarrhythmia.  Her new device is amenable to remote monitoring, but so far we have had poor luck since there is very poor cellular phone signal in [place], [place].  Her son is trying to get a cellular booster.  She is generally doing well she has some episodes of what sounds like true vertigo with nausea and spinning sensation.  They often happen in the mornings.  She is been reluctant to follow her son's advised to go and see an ear nose and throat specialist.  She has not had issues with palpitations, orthopnea, PND, syncope, orthostatic hypotension pattern, bleeding, falls or injuries, angina pectoris or focal neurological complaints.  Her blood pressure is high normal.  Echocardiogram March 2017 showed an ejection fraction of 45-50%, mild mitral regurgitation, moderate to severe tricuspid regurgitation with an estimated systolic PA pressure 56 mmHg. Her echo in 2012 had described moderate to severe mitral  regurgitation.  Repeat echo Apr 28 2017  - Left ventricle: Wall thickness was increased in a pattern of mild LVH. Systolic function was normal. The estimated ejection fraction was in the range of 50% to 55%. The study is not technically sufficient to allow evaluation of LV diastolic function.  Aortic valve: There was mild stenosis. There was trivial regurgitation. - Mitral valve: Moderately calcified annulus. Moderately thickened,   mildly calcified leaflets . There was mild regurgitation. - Left atrium: The atrium was severely dilated. - Right ventricle: The cavity size was mildly dilated. - Right atrium: The atrium was severely dilated. - Atrial septum: No defect or patent foramen ovale was identified. - Tricuspid valve: There was moderate regurgitation.   Past Medical History:  Diagnosis Date  . CHF (congestive heart failure) (HCC)   . Diabetes mellitus without complication (HCC)   . Hypertension   . LVH (left ventricular hypertrophy)   . Mitral regurgitation    mod to severe  . Permanent atrial fibrillation (HCC)   . Pulmonary hypertension (HCC)    severe  . Thyroid disease   . Tricuspid regurgitation    severe    Past Surgical History:  Procedure Laterality Date  . ABDOMINAL HYSTERECTOMY    . CARDIOVERSION  5 /18/ 2000   unsuccessful  . CHOLECYSTECTOMY    . COMPRESSION HIP SCREW Right 06/06/2012   Procedure: Open Reduction Internal Fixation Right HIp;  Surgeon: Dannielle Huh, MD;  Location: [facility] OR;  Service: Orthopedics;  Laterality: Right;  . ESOPHAGOGASTRODUODENOSCOPY N/A 06/09/2015  Procedure: ESOPHAGOGASTRODUODENOSCOPY (EGD);  Surgeon: Charlott Rakes, MD;  Location: [facility] ENDOSCOPY;  Service: Endoscopy;  Laterality: N/A;  . knee replacement     R  . NM MYOCAR PERF WALL MOTION  02/28/2009   No signficant ischemia  . PACEMAKER INSERTION  05/26/2004   [employer].Jude  . PPM GENERATOR CHANGEOUT N/A 08/17/2017   Procedure: PPM GENERATOR CHANGEOUT;  Surgeon: Thurmon Fair, MD;   Location: [facility] INVASIVE CV LAB;  Service: Cardiovascular;  Laterality: N/A;  . THYROIDECTOMY    . TUBAL LIGATION    . US ECHOCARDIOGRAPHY  02/25/2011   mod to severe MR,LA severe dilated,mild annular ca+,mod. TR,mod PI,mod ca+ of the aortic valve leaflet    Current Medications: Outpatient Medications Prior to Visit  Medication Sig Dispense Refill  . amLODipine (NORVASC) 5 MG tablet TAKE 1 TABLET (5 MG TOTAL) BY MOUTH EVERY MORNING. 90 tablet 2  . atorvastatin (LIPITOR) 10 MG tablet Take 10 mg by mouth every evening.    . furosemide (LASIX) 40 MG tablet TAKE 1 TABLET (40 MG TOTAL) BY MOUTH 3 (THREE) TIMES A WEEK. MONDAYS, WEDNESDAYS, AND FRIDAYS (Patient taking differently: Take 40 mg by mouth every Monday, Wednesday, and Friday. ) 90 tablet 3  . hydrochlorothiazide (HYDRODIURIL) 25 MG tablet Take 0.5 tablets (12.5 mg total) by mouth every morning. (Patient taking differently: Take 25 mg by mouth every morning. ) 90 tablet 1  . isosorbide mononitrate (IMDUR) 30 MG 24 hr tablet Take 1 tablet (30 mg total) by mouth every morning. (Patient taking differently: Take 15 mg by mouth every morning. ) 90 tablet 3  . methimazole (TAPAZOLE) 10 MG tablet Take 5 mg by mouth daily.    . metoprolol succinate (TOPROL-XL) 50 MG 24 hr tablet Take 1 tablet (50 mg total) by mouth daily. Take with or immediately following a meal. 90 tablet 3  . potassium chloride SA (K-DUR,KLOR-CON) 20 MEQ tablet Take 1-2 tablets (20-40 mEq total) by mouth daily as directed (Patient taking differently: Take 20-40 mEq by mouth See admin instructions. Take 40 meq by mouth on Monday, Wednesday and Friday. Take 10 meq by mouth daily on all other days) 180 tablet 3  . ramipril (ALTACE) 5 MG capsule Take 1 capsule (5 mg total) by mouth daily. 30 capsule 6  . warfarin (COUMADIN) 5 MG tablet TAKE 1 TABLET BY MOUTH DAILY OR AS DIRECTED BY COUMADIN CLINIC 90 tablet 0   No facility-administered medications prior to visit.      Allergies:    Iohexol and Shrimp [shellfish allergy]   Social History   Socioeconomic History  . Marital status: Widowed    Spouse name: Not on file  . Number of children: Not on file  . Years of education: Not on file  . Highest education level: Not on file  Occupational History  . Not on file  Social Needs  . Financial resource strain: Not on file  . Food insecurity:    Worry: Not on file    Inability: Not on file  . Transportation needs:    Medical: Not on file    Non-medical: Not on file  Tobacco Use  . Smoking status: Never Smoker  . Smokeless tobacco: Never Used  Substance and Sexual Activity  . Alcohol use: No  . Drug use: No  . Sexual activity: Never  Lifestyle  . Physical activity:    Days per week: Not on file    Minutes per session: Not on file  . Stress: Not on file  Relationships  . Social connections:    Talks on phone: Not on file    Gets together: Not on file    Attends religious service: Not on file    Active member of club or organization: Not on file    Attends meetings of clubs or organizations: Not on file    Relationship status: Not on file  Other Topics Concern  . Not on file  Social History Narrative  . Not on file     Family History:  The patient's family history includes Asthma in her mother; Cancer - Colon in her brother; Gallbladder disease in her maternal grandmother; Heart attack in her brother and father; Heart disease in her child; Stroke in her maternal grandfather and sister.   ROS:   Please see the history of present illness.    ROS All other systems reviewed and are negative.   PHYSICAL EXAM:   VS:  BP (!) 143/77   Pulse 72   Ht 5' (1.524 m)   Wt 156 lb 6.4 oz (70.9 kg)   BMI 30.54 kg/m     General: Alert, oriented x3, no distress, order line obese Head: no evidence of trauma, PERRL, EOMI, no exophtalmos or lid lag, no myxedema, no xanthelasma; normal ears, nose and oropharynx Neck: normal jugular venous pulsations and no  hepatojugular reflux; brisk carotid pulses without delay and no carotid bruits Chest: clear to auscultation, no signs of consolidation by percussion or palpation, normal fremitus, symmetrical and full respiratory excursions Cardiovascular: normal position and quality of the apical impulse, regular rhythm, normal first and paradoxically split second heart sounds, he has a barely audible 1/6 holosystolic murmur at the left lower sternal border, no diastolic murmurs, rubs or gallops Abdomen: no tenderness or distention, no masses by palpation, no abnormal pulsatility or arterial bruits, normal bowel sounds, no hepatosplenomegaly Extremities: no clubbing, cyanosis or edema; 2+ radial, ulnar and brachial pulses bilaterally; 2+ right femoral, posterior tibial and dorsalis pedis pulses; 2+ left femoral, posterior tibial and dorsalis pedis pulses; no subclavian or femoral bruits Neurological: grossly nonfocal Psych: Normal mood and affect   Wt Readings from Last 3 Encounters:  09/22/17 156 lb 6.4 oz (70.9 kg)  08/17/17 148 lb (67.1 kg)  06/29/17 145 lb (65.8 kg)     Studies/Labs Reviewed:   EKG:  EKG is ordered today.  Atrial fibrillation with ventricular paced rhythm, QTC 496 ms  ASSESSMENT:    1. Chronic diastolic heart failure (HCC)   2. Permanent atrial fibrillation (HCC)   3. On continuous oral anticoagulation   4. CHB (complete heart block) (HCC)   5. Essential hypertension   6. Diabetes mellitus type 2 in obese (HCC)   7. Hypercholesterolemia   8. Pacemaker      PLAN:  In order of problems listed above:  1. CHF: As always she is very sedentary and is hard to assess her functional status, but I think she is compensated.  She does not appear hypervolemic.  No changes made to medications. 2. Permanent atrial fibrillation s/p AV node ablation.  CHADSVasc 5 (age 32, gender, HF, HTN, DM). On anticoagulation. 3. Warfarin: He has not had any bleeding complications or falls. 4. CHB: No  escape rhythm detected today. 5. HTN: Fair control, I would avoid more aggressive control her current problems of dizziness (although I think she has true vertigo, probably BPV) 6. DM hemoglobin A1c in target range without medications.  Well-controlled 7. HLP: On statin, excellent LDL level last checked 8.  Pacemaker: Recent generator change out.  Site has healed well.   Medication Adjustments/Labs and Tests Ordered: Current medicines are reviewed at length with the patient today.  Concerns regarding medicines are outlined above.  Medication changes, Labs and Tests ordered today are listed in the Patient Instructions below. Patient Instructions  Dr Royann Shivers recommends that you continue on your current medications as directed. Please refer to the Current Medication list given to you today.  Remote monitoring is used to monitor your Pacemaker or ICD from home. This monitoring reduces the number of office visits required to check your device to one time per year. It allows [place] to keep an eye on the functioning of your device to ensure it is working properly. You are scheduled for a device check from home on Tuesday, September 17th, 2019. You may send your transmission at any time that day. If you have a wireless device, the transmission will be sent automatically. After your physician reviews your transmission, you will receive a notification with your next transmission date.  To improve our patient care and to more adequately follow your device, [facility] has decided, as a practice, to start following each patient four times a year with your home monitor. This means that you may experience a remote appointment that is close to an in-office appointment with your physician. Your insurance will apply at the same rate as other remote monitoring transmissions.  Dr Royann Shivers recommends that you schedule a follow-up appointment in 12 months with a pacemaker check. You will receive a reminder letter in the  mail two months in advance. If you don't receive a letter, please call our office to schedule the follow-up appointment.  If you need a refill on your cardiac medications before your next appointment, please call your pharmacy.    Signed, Thurmon Fair, MD  09/24/2017 6:04 PM    [facility] [address] [place], [place], [place]  [address] Phone: [phone]; Fax: [phone]

## 2017-09-22 NOTE — Patient Instructions (Signed)
Dr Croitoru recommends that you continue on your current medications as directed. Please refer to the Current Medication list given to you today.  Remote monitoring is used to monitor your Pacemaker or ICD from home. This monitoring reduces the number of office visits required to check your device to one time per year. It allows us to keep an eye on the functioning of your device to ensure it is working properly. You are scheduled for a device check from home on Tuesday, September 17th, 2019. You may send your transmission at any time that day. If you have a wireless device, the transmission will be sent automatically. After your physician reviews your transmission, you will receive a notification with your next transmission date.  To improve our patient care and to more adequately follow your device, [facility] has decided, as a practice, to start following each patient four times a year with your home monitor. This means that you may experience a remote appointment that is close to an in-office appointment with your physician. Your insurance will apply at the same rate as other remote monitoring transmissions.  Dr Croitoru recommends that you schedule a follow-up appointment in 12 months with a pacemaker check. You will receive a reminder letter in the mail two months in advance. If you don't receive a letter, please call our office to schedule the follow-up appointment.  If you need a refill on your cardiac medications before your next appointment, please call your pharmacy. 

## 2017-09-24 ENCOUNTER — Encounter: Payer: Self-pay | Admitting: Cardiovascular Disease

## 2017-10-06 ENCOUNTER — Other Ambulatory Visit: Payer: Self-pay | Admitting: Cardiovascular Disease

## 2017-11-04 LAB — CUP PACEART INCLINIC DEVICE CHECK
Implantable Lead Implant Date: 20060313
Implantable Lead Location: 753859
Implantable Pulse Generator Implant Date: 20190604
MDC IDC LEAD IMPLANT DT: 20060313
MDC IDC LEAD LOCATION: 753860
MDC IDC SESS DTM: 20190822162440
MDC IDC SET LEADCHNL RV PACING AMPLITUDE: 1.125
MDC IDC SET LEADCHNL RV PACING PULSEWIDTH: 0.5 ms
MDC IDC SET LEADCHNL RV SENSING SENSITIVITY: 4 mV
Pulse Gen Serial Number: 9029685

## 2017-11-30 ENCOUNTER — Ambulatory Visit (INDEPENDENT_AMBULATORY_CARE_PROVIDER_SITE_OTHER): Payer: Medicare Other | Admitting: Pharmacist Clinician (PhC)/ Clinical Pharmacy Specialist

## 2017-11-30 ENCOUNTER — Encounter: Payer: Self-pay | Admitting: Cardiovascular Disease

## 2017-11-30 ENCOUNTER — Ambulatory Visit (INDEPENDENT_AMBULATORY_CARE_PROVIDER_SITE_OTHER): Payer: Medicare Other | Admitting: *Deleted

## 2017-11-30 ENCOUNTER — Ambulatory Visit (INDEPENDENT_AMBULATORY_CARE_PROVIDER_SITE_OTHER): Payer: Medicare Other | Admitting: Cardiovascular Disease

## 2017-11-30 DIAGNOSIS — Z7901 Long term (current) use of anticoagulants: Secondary | ICD-10-CM | POA: Diagnosis not present

## 2017-11-30 DIAGNOSIS — Z950 Presence of cardiac pacemaker: Secondary | ICD-10-CM

## 2017-11-30 DIAGNOSIS — I10 Essential (primary) hypertension: Secondary | ICD-10-CM | POA: Diagnosis not present

## 2017-11-30 DIAGNOSIS — I5042 Chronic combined systolic (congestive) and diastolic (congestive) heart failure: Secondary | ICD-10-CM | POA: Diagnosis not present

## 2017-11-30 DIAGNOSIS — I482 Chronic atrial fibrillation: Secondary | ICD-10-CM

## 2017-11-30 DIAGNOSIS — I4891 Unspecified atrial fibrillation: Secondary | ICD-10-CM

## 2017-11-30 DIAGNOSIS — I442 Atrioventricular block, complete: Secondary | ICD-10-CM

## 2017-11-30 DIAGNOSIS — I4821 Permanent atrial fibrillation: Secondary | ICD-10-CM

## 2017-11-30 DIAGNOSIS — E785 Hyperlipidemia, unspecified: Secondary | ICD-10-CM

## 2017-11-30 LAB — POCT INR: INR: 3.3 — AB (ref 2.0–3.0)

## 2017-11-30 NOTE — Patient Instructions (Signed)

## 2017-11-30 NOTE — Progress Notes (Signed)
Remote pacemaker transmission.   

## 2017-11-30 NOTE — Progress Notes (Signed)
Patient ID: Denise [name], female   DOB: 07-27-1923, 84 y.o.   MRN: [id number]    HPI: Denise [name] is a 84 y.o. female who presents to the office today for a 7 month followup cardiology evaluation.   Ms. Denise [name] has a history of permanent atrial fibrillation and is status post AV node ablation by Dr. Mitzi Davenport and permanent pacemaker implantation in June 2006. She has a history of hypertensive cardiomyopathy. She has documented severe LA dilatation. On an echo in December 2012 ejection fraction was 45-50%. She had moderate to severe MR, moderate pulmonary hypertension a nodular sclerosis of the aortic valve the  She has a dual-chamber St. Jude identity permanent pacemaker. She is followed by Dr. Sallyanne Kuster for her pacemaker. Pacemaker interrogation revealed normal function, and no reprogramming changes were necessary.  She is pacemaker dependent and no escape rhythm was present.  When the pacemaker was taken down to 30 bpm.  She had excellent lead parameters and no high ventricular rates were recorded.  She was last evaluated by him on 07/09/2015.  In March 2014 she broke her right hip and her right arm and required surgery by Dr. Lorre Nick. After being hospitalized she went to a nursing facility .  In 2017 an echo Doppler study showed an EF of 45-50% with a questionable region of possible septal and inferior hypokinesis.  There was moderately severe tricuspid regurgitation.  There is moderate bony hypertension with PA pressure 56 mm.  She sees Dr. Sallyanne Kuster for pacemaker.  She is programmed in VVIR since she is now on permanent atrial fibrillation.  There was 96% ventricular pacing with an underlying escape rhythm at 34 bpm.  Her St. Jude identity pacemaker has only 6 months of estimated longevity.  Since I last saw her in September 2018 she denies any episodes of chest pain or shortness of breath.  She saw Dr. Sallyanne Kuster 02/17/2017 for follow-up evaluation.  She has chronic combined systolic and diastolic  heart failure near Heart Association class II.  She has permanent atrial fibrillation.  She has pacing-induced dyssynchrony but does not appear to need CRRT.  She denied any dyspnea.  She has underlying complete heart block and in the past had a reasonable reliable escape rhythm, but there is been no escape rhythm in the last 2 office evaluation device checks.  She is pacemaker dependent following her AV node ablation.    She underwent a follow-up echo Doppler study on 05/03/2017.  EF is 50-55%.  There was mild aortic sclerosis.  There was moderate mitral annular calcification with mild MR, there was severe biatrial enlargement.  There was moderate tricuspid regurgitation.    Since I last saw her in February 2019, she underwent replacement of her dual-chamber [place] identity pacemaker with an surety MRI device in June 2019 by Dr. Sallyanne Kuster.  This was programmed to VVIR due to her underlying permanent atrial fibrillation.  She is pacemaker dependent.  Her new device is amenable to remote monitoring but she lives in an area where there is very poor cellular signal.  She denies any episodes of chest pain.  At times she has noticed some vertigo symptoms.  She continues to be on Coumadin for anticoagulation.  She denies PND orthopnea.  She denies bleeding.  She has a history of previous documentation of a Hollenhorst plaque and has been on statin therapy.  She presents for reevaluation   Past Medical History:  Diagnosis Date  . CHF (congestive heart failure) ([place])   .  Diabetes mellitus without complication ([place])   . Hypertension   . LVH (left ventricular hypertrophy)   . Mitral regurgitation    mod to severe  . Permanent atrial fibrillation ([place])   . Pulmonary hypertension ([facility])    severe  . Thyroid disease   . Tricuspid regurgitation    severe    Past Surgical History:  Procedure Laterality Date  . ABDOMINAL HYSTERECTOMY    . CARDIOVERSION  5 /18/ 2000   unsuccessful  . CHOLECYSTECTOMY      . COMPRESSION HIP SCREW Right 06/06/2012   Procedure: Open Reduction Internal Fixation Right HIp;  Surgeon: Vickey Huger, MD;  Location: [facility];  Service: Orthopedics;  Laterality: Right;  . ESOPHAGOGASTRODUODENOSCOPY N/A 06/09/2015   Procedure: ESOPHAGOGASTRODUODENOSCOPY (EGD);  Surgeon: Wilford Corner, MD;  Location[facility] ENDOSCOPY;  Service: Endoscopy;  Laterality: N/A;  . knee replacement     R  . NM MYOCAR PERF WALL MOTION  02/28/2009   No signficant ischemia  . PACEMAKER INSERTION  05/26/2004   St.Jude  . PPM GENERATOR CHANGEOUT N/A 08/17/2017   Procedure: PPM GENERATOR CHANGEOUT;  Surgeon: Sanda Klein, MD;  Location: [facility];  Service: Cardiovascular;  Laterality: N/A;  . THYROIDECTOMY    . TUBAL LIGATION    . US ECHOCARDIOGRAPHY  02/25/2011   mod to severe MR,LA severe dilated,mild annular ca+,mod. TR,mod PI,mod ca+ of the aortic valve leaflet    Allergies  Allergen Reactions  . Iohexol Rash and Other (See Comments)     Desc: VOMITING-VERIFIED ON 05-24-04 PRE-PROCEDURE/ ARS   . Shrimp [Shellfish Allergy] Nausea And Vomiting and Rash    Current Outpatient Medications  Medication Sig Dispense Refill  . amLODipine (NORVASC) 5 MG tablet TAKE 1 TABLET (5 MG TOTAL) BY MOUTH EVERY MORNING. 90 tablet 2  . atorvastatin (LIPITOR) 10 MG tablet Take 10 mg by mouth every evening.    . hydrochlorothiazide (HYDRODIURIL) 25 MG tablet Take 0.5 tablets (12.5 mg total) by mouth every morning. (Patient taking differently: Take 25 mg by mouth every morning. ) 90 tablet 1  . isosorbide mononitrate (IMDUR) 30 MG 24 hr tablet Take 1 tablet (30 mg total) by mouth every morning. (Patient taking differently: Take 15 mg by mouth every morning. ) 90 tablet 3  . methimazole (TAPAZOLE) 10 MG tablet Take 5 mg by mouth daily.    . metoprolol succinate (TOPROL-XL) 50 MG 24 hr tablet Take 1 tablet (50 mg total) by mouth daily. Take with or immediately following a meal. 90 tablet 3  . potassium  chloride SA (K-DUR,KLOR-CON) 20 MEQ tablet Take 1-2 tablets (20-40 mEq total) by mouth daily as directed (Patient taking differently: Take 20-40 mEq by mouth See admin instructions. Take 40 meq by mouth on Monday, Wednesday and Friday. Take 10 meq by mouth daily on all other days) 180 tablet 3  . ramipril (ALTACE) 5 MG capsule Take 1 capsule (5 mg total) by mouth daily. 30 capsule 6  . warfarin (COUMADIN) 5 MG tablet TAKE 1 TABLET BY MOUTH DAILY OR AS DIRECTED BY COUMADIN CLINIC 90 tablet 0  . furosemide (LASIX) 40 MG tablet TAKE 1 TABLET (40 MG TOTAL) BY MOUTH 3 (THREE) TIMES A WEEK. MONDAYS, WEDNESDAYS, AND FRIDAYS (Patient taking differently: Take 40 mg by mouth every Monday, Wednesday, and Friday. ) 90 tablet 3   No current facility-administered medications for this visit.     Socially she is widowed and has 4 children 4 grandchildren 4 great-grandchildren. There is no tobacco  or alcohol use.  ROS General: Negative; No fevers, chills, or night sweats;  HEENT: Negative; No changes in vision or hearing, sinus congestion, difficulty swallowing Pulmonary: Negative; No cough, wheezing, shortness of breath, hemoptysis Cardiovascular: Negative; No chest pain, presyncope, syncope, palpitations Mild intermittent lower extremity edema GI: Negative; No nausea, vomiting, diarrhea, or abdominal pain GU: Negative; No dysuria, hematuria, or difficulty voiding Musculoskeletal: Negative; no myalgias, joint pain, or weakness Hematologic/Oncology: Negative; no easy bruising, bleeding Endocrine: Positive for diabetes mellitus and thyroid disease. Neuro: Negative; no changes in balance, headaches Skin: Negative; No rashes or skin lesions Psychiatric: Negative; No behavioral problems, depression Sleep: Negative; No snoring, daytime sleepiness, hypersomnolence, bruxism, restless legs, hypnogognic hallucinations, no cataplexy Other comprehensive 14 point system review is negative.  PE BP 107/64   Pulse 70    Ht 5' (1.524 m)   Wt 147 lb 12.8 oz (67 kg)   BMI 28.87 kg/m    Repeat blood pressure by me was 112/64.  Wt Readings from Last 3 Encounters:  11/30/17 147 lb 12.8 oz (67 kg)  09/22/17 156 lb 6.4 oz (70.9 kg)  08/17/17 148 lb (67.1 kg)   General: Alert, oriented, no distress.  Skin: normal turgor, no rashes, warm and dry HEENT: Normocephalic, atraumatic. Pupils equal round and reactive to light; sclera anicteric; extraocular muscles intact;  Nose without nasal septal hypertrophy Mouth/Parynx benign; Mallinpatti scale 3 Neck: No JVD, no carotid bruits; normal carotid upstroke Lungs: clear to ausculatation and percussion; no wheezing or rales Chest wall: without tenderness to palpitation Heart: PMI not displaced, RRR, s1 s2 normal, 2/6 systolic murmur, no diastolic murmur, no rubs, gallops, thrills, or heaves Abdomen: soft, nontender; no hepatosplenomehaly, BS+; abdominal aorta nontender and not dilated by palpation. Back: no CVA tenderness Pulses 2+ Musculoskeletal: full range of motion, normal strength, no joint deformities Extremities: no clubbing cyanosis or edema, Homan's sign negative  Neurologic: grossly nonfocal; Cranial nerves grossly wnl Psychologic: Normal mood and affect   ECG (independently read by me): Ventricular paced rhythm at 70 bpm.  September 2018  ECG (independently read by me): ventricular paced rhythm at 72 bpm with a PVC.  QTc interval 508 ms.  QRS interval 176 ms.  March 2018 ECG (independently read by me): Underlying atrial fibrillation with ventricular paced rhythm at 70 bpm.  On 100% paced  September 2017 ECG (independently read by me): V paced rhythm at 72 bpm.  One PVC.  January 2017 ECG (independently read by me):  V paced rhythm at 70 bpm with underlying atrial fibrillation.  August 2016 ECG (independently read by me): 100% ventricular paced rhythm at 70 bpm with underlying atrial fibrillation.  August 2015 ECG (independently read by me):  Underlying atrial fibrillation with ventricular paced rhythm at 70 beats per minute with 100% capture.  LABS BMP Latest Ref Rng & Units 08/17/2017 05/11/2017 11/20/2015  Glucose 65 - 99 mg/dL 118(H) 127(H) 106(H)  BUN 6 - 20 mg/dL 24(H) 23 22  Creatinine 0.44 - 1.00 mg/dL 1.28(H) 1.10(H) 1.04(H)  BUN/Creat Ratio 12 - 28 - 21 -  Sodium 135 - 145 mmol/L 142 144 138  Potassium 3.5 - 5.1 mmol/L 3.0(L) 4.0 3.5  Chloride 101 - 111 mmol/L 105 100 99  CO2 22 - 32 mmol/L '31 25 28  '$ Calcium 8.9 - 10.3 mg/dL 9.4 9.6 9.4   Hepatic Function Latest Ref Rng & Units 05/11/2017 11/20/2015 06/07/2015  Total Protein 6.0 - 8.5 g/dL 7.4 7.2 7.7  Albumin 3.2 - 4.6 g/dL 4.2 4.3  4.3  AST 0 - 40 IU/L 19 38(H) 25  ALT 0 - 32 IU/L 20 33(H) 21  Alk Phosphatase 39 - 117 IU/L 123(H) 108 82  Total Bilirubin 0.0 - 1.2 mg/dL 1.3(H) 1.3(H) 1.7(H)   CBC Latest Ref Rng & Units 08/17/2017 05/11/2017 11/20/2015  WBC 4.0 - 10.5 K/uL 6.9 7.7 7.0  Hemoglobin 12.0 - 15.0 g/dL 12.6 12.9 13.8  Hematocrit 36.0 - 46.0 % 40.5 38.8 42.5  Platelets 150 - 400 K/uL 167 217 158   Lab Results  Component Value Date   MCV 88.6 08/17/2017   MCV 81 05/11/2017   MCV 85.9 11/20/2015   Lab Results  Component Value Date   TSH 0.007 (L) 05/11/2017   Lab Results  Component Value Date   HGBA1C 6.5 (H) 06/07/2015    Lipid Panel  No results found for: CHOL, TRIG, HDL, CHOLHDL, VLDL, LDLCALC, LDLDIRECT   INR today checked in office: 2.0  RADIOLOGY: No results found.  IMPRESSION:  1. Chronic combined systolic and diastolic heart failure ([place])   2. Permanent atrial fibrillation ([place])   3. Essential hypertension   4. Long term current use of anticoagulant therapy   5. Pacemaker   6. Hyperlipidemia with target LDL less than 70     ASSESSMENT AND PLAN: Ms. Polcyn is a young appearing 84 year old female who has a history of permanent atrial fibrillation and is status post AV node ablation and previously had a  St. Jude identity dual-chamber  pacemaker programmed to VVIR mode in light of her atrial fibrillation. She was hospitalized in March 2017 and was  treated with IV Lasix but I do not have the specifics of her hospital records, but I suspect she may of had volume overload.  Echo Doppler study at that time showed an EF of 45-50%. A follow-up echo Doppler study on 04/28/2017 which showed an EF of 50-55%.  There was mild aortic valve stenosis with trivial AR.  There was moderate mitral annular calcification with mild MR.  There was severe biatrial enlargement.  She is on warfarin.  Since I last saw her her pacemaker approach end-of-life and she underwent replacement with an Essure T MRI device in June 2019 programmed to VVIR due to her permanent atrial fibrillation.  She is pacemaker dependent.  Her blood pressure today is stable.  Her ECG shows ventricular pacing.  An INR was checked today in the office and this was 3.0.  Coumadin dose was adjusted.  She denies bleeding.  Her blood pressure today is stable on amlodipine 5 mg, HCTZ 12.5 mg, ramipril 5 mg, in addition to Toprol-XL 50 mg daily.  She continues to be on atorvastatin with target LDL less than 70.  She is euvolemic on exam.  I will see her in 6 months for reevaluation.  Time spent: 25 minutes Troy Sine, MD, [facility]  12/02/2017 8:35 PM

## 2017-12-02 ENCOUNTER — Encounter: Payer: Self-pay | Admitting: Cardiovascular Disease

## 2017-12-06 ENCOUNTER — Other Ambulatory Visit: Payer: Self-pay | Admitting: Pharmacist Clinician (PhC)/ Clinical Pharmacy Specialist

## 2017-12-06 MED ORDER — WARFARIN SODIUM 5 MG PO TABS: ORAL_TABLET | ORAL | 1 refills | Status: DC

## 2017-12-22 LAB — PROTIME-INR: INR: 2.3 — AB (ref 0.9–1.1)

## 2017-12-23 ENCOUNTER — Ambulatory Visit (INDEPENDENT_AMBULATORY_CARE_PROVIDER_SITE_OTHER): Payer: Medicare Other | Admitting: Pharmacist Clinician (PhC)/ Clinical Pharmacy Specialist

## 2017-12-23 DIAGNOSIS — I4821 Permanent atrial fibrillation: Secondary | ICD-10-CM

## 2017-12-23 DIAGNOSIS — Z7901 Long term (current) use of anticoagulants: Secondary | ICD-10-CM

## 2017-12-30 LAB — CUP PACEART REMOTE DEVICE CHECK
Battery Remaining Longevity: 131 mo
Battery Remaining Percentage: 95.5 %
Date Time Interrogation Session: 20190917064654
Implantable Lead Implant Date: 20060313
Implantable Pulse Generator Implant Date: 20190604
Lead Channel Pacing Threshold Pulse Width: 0.5 ms
Lead Channel Setting Sensing Sensitivity: 4 mV
MDC IDC LEAD IMPLANT DT: 20060313
MDC IDC LEAD LOCATION: 753859
MDC IDC LEAD LOCATION: 753860
MDC IDC MSMT BATTERY VOLTAGE: 3.04 V
MDC IDC MSMT LEADCHNL RV IMPEDANCE VALUE: 480 Ohm
MDC IDC MSMT LEADCHNL RV PACING THRESHOLD AMPLITUDE: 0.75 V
MDC IDC MSMT LEADCHNL RV SENSING INTR AMPL: 12 mV
MDC IDC PG SERIAL: 9029685
MDC IDC SET LEADCHNL RV PACING AMPLITUDE: 1 V
MDC IDC SET LEADCHNL RV PACING PULSEWIDTH: 0.5 ms
MDC IDC STAT BRADY RV PERCENT PACED: 98 %

## 2018-02-09 ENCOUNTER — Telehealth: Payer: Self-pay

## 2018-02-09 NOTE — Telephone Encounter (Signed)
Called pt to schedule overdue inr pt stated that they get inr done at hospital in [place] and that they are supposed to give coumadin clinic at [place] the results and they fail to do so

## 2018-02-11 LAB — POCT INR: INR: 2.4 (ref 2.0–3.0)

## 2018-02-14 ENCOUNTER — Ambulatory Visit (INDEPENDENT_AMBULATORY_CARE_PROVIDER_SITE_OTHER): Payer: Medicare Other

## 2018-02-14 DIAGNOSIS — Z7901 Long term (current) use of anticoagulants: Secondary | ICD-10-CM

## 2018-02-14 DIAGNOSIS — I4891 Unspecified atrial fibrillation: Secondary | ICD-10-CM

## 2018-03-01 ENCOUNTER — Ambulatory Visit (INDEPENDENT_AMBULATORY_CARE_PROVIDER_SITE_OTHER): Payer: Medicare Other

## 2018-03-01 DIAGNOSIS — I442 Atrioventricular block, complete: Secondary | ICD-10-CM | POA: Diagnosis not present

## 2018-03-01 DIAGNOSIS — R001 Bradycardia, unspecified: Secondary | ICD-10-CM

## 2018-03-01 NOTE — Progress Notes (Signed)
Remote pacemaker transmission.   

## 2018-03-02 ENCOUNTER — Encounter: Payer: Self-pay | Admitting: Cardiology

## 2018-03-12 ENCOUNTER — Other Ambulatory Visit: Payer: Self-pay | Admitting: Cardiovascular Disease

## 2018-03-14 NOTE — Telephone Encounter (Signed)
Rx request sent to pharmacy.  

## 2018-03-30 ENCOUNTER — Telehealth: Payer: Self-pay

## 2018-03-30 NOTE — Telephone Encounter (Signed)
Pt son stated that he took her before christmas to [facility] [facility] and that we haven't heard from them and that they have had issues with [place] [facility] sending lab results to the coumadin clinic and that pt will go to [place] [facility] and try again and give them a talking to

## 2018-03-31 LAB — PROTIME-INR: INR: 4.2 — AB (ref 0.9–1.1)

## 2018-04-01 ENCOUNTER — Ambulatory Visit (INDEPENDENT_AMBULATORY_CARE_PROVIDER_SITE_OTHER): Payer: Medicare Other | Admitting: Internal Medicine

## 2018-04-01 DIAGNOSIS — I4891 Unspecified atrial fibrillation: Secondary | ICD-10-CM | POA: Diagnosis not present

## 2018-04-01 DIAGNOSIS — Z7901 Long term (current) use of anticoagulants: Secondary | ICD-10-CM

## 2018-04-02 LAB — CUP PACEART REMOTE DEVICE CHECK
Battery Remaining Longevity: 125 mo
Date Time Interrogation Session: 20191217070013
Implantable Lead Implant Date: 20060313
Implantable Lead Location: 753859
Implantable Pulse Generator Implant Date: 20190604
Lead Channel Pacing Threshold Amplitude: 1.125 V
Lead Channel Sensing Intrinsic Amplitude: 10 mV
Lead Channel Setting Pacing Amplitude: 1.375
Lead Channel Setting Pacing Pulse Width: 0.5 ms
Lead Channel Setting Sensing Sensitivity: 4 mV
MDC IDC LEAD IMPLANT DT: 20060313
MDC IDC LEAD LOCATION: 753860
MDC IDC MSMT BATTERY REMAINING PERCENTAGE: 95.5 %
MDC IDC MSMT BATTERY VOLTAGE: 3.02 V
MDC IDC MSMT LEADCHNL RV IMPEDANCE VALUE: 460 Ohm
MDC IDC MSMT LEADCHNL RV PACING THRESHOLD PULSEWIDTH: 0.5 ms
MDC IDC PG SERIAL: 9029685
MDC IDC STAT BRADY RV PERCENT PACED: 98 %
Pulse Gen Model: 2272

## 2018-04-11 ENCOUNTER — Telehealth: Payer: Self-pay | Admitting: Cardiovascular Disease

## 2018-04-11 NOTE — Telephone Encounter (Signed)
Pt's pharmacy [facility] is requesting a 90 day supply of ramipril resent to their pharmacy. Please address

## 2018-04-12 MED ORDER — RAMIPRIL 5 MG PO CAPS: 5.0000 mg | ORAL_CAPSULE | Freq: Every day | ORAL | 1 refills | Status: DC

## 2018-04-12 NOTE — Telephone Encounter (Signed)
Refill sent to the pharmacy electronically.  

## 2018-04-14 LAB — PROTIME-INR: INR: 2.6 — AB (ref 0.9–1.1)

## 2018-04-15 ENCOUNTER — Ambulatory Visit (INDEPENDENT_AMBULATORY_CARE_PROVIDER_SITE_OTHER): Payer: Medicare Other | Admitting: Pharmacist Clinician (PhC)/ Clinical Pharmacy Specialist

## 2018-04-15 DIAGNOSIS — Z7901 Long term (current) use of anticoagulants: Secondary | ICD-10-CM | POA: Diagnosis not present

## 2018-04-15 DIAGNOSIS — I4891 Unspecified atrial fibrillation: Secondary | ICD-10-CM

## 2018-04-15 LAB — CUP PACEART INCLINIC DEVICE CHECK
Implantable Pulse Generator Implant Date: 20190604
MDC IDC SESS DTM: 20200131125336
Pulse Gen Model: 2272
Pulse Gen Serial Number: 9029685

## 2018-05-09 ENCOUNTER — Telehealth: Payer: Self-pay | Admitting: Pharmacist Clinician (PhC)/ Clinical Pharmacy Specialist

## 2018-05-09 NOTE — Telephone Encounter (Signed)
Son called to report that patient had nosebleed this am.  Lasted about 90 minutes.  Took pt to MD but was told to contact us and also use [place] nasal saline each night prior to oxygen.    Reviewed with son how to help prevent further nosebleeds as well as to go to ED should it restart and not stop.  (He states they were told not to return until bleeding was > 1 hr).  Also has been having problems in getting her to the lab for monthly INR draws.  Often will have to wait 1-2 hours at hospital lab and he worries about her picking up germs while on hospital grounds.    Discussed with son the option of switching to Eliquis and he is agreeable.  Chart reviewed, patient does not have a history of valve replacement of valvular AF.  Spoke to her pharmacy and cost for one month supply would be $8.95.    Son agreeable to change.  Understands not to give patient any warfarin tomorrow until he hears from [place] about her INR and we are sure her nosebleed does not return.  He expects to have the prescription for Eliquis by Wed/Thur and we will transition her this week.

## 2018-05-10 ENCOUNTER — Other Ambulatory Visit: Payer: Self-pay

## 2018-05-10 ENCOUNTER — Telehealth: Payer: Self-pay | Admitting: Cardiovascular Disease

## 2018-05-10 ENCOUNTER — Emergency Department (HOSPITAL_COMMUNITY)
Admission: EM | Admit: 2018-05-10 | Discharge: 2018-05-10 | Disposition: A | Discharge status: Home / Self Care | Payer: Medicare Other | Attending: Emergency Medicine | Admitting: Emergency Medicine

## 2018-05-10 ENCOUNTER — Telehealth: Payer: Self-pay

## 2018-05-10 ENCOUNTER — Encounter (HOSPITAL_COMMUNITY): Payer: Self-pay | Admitting: Emergency Medicine

## 2018-05-10 DIAGNOSIS — R040 Epistaxis: Secondary | ICD-10-CM | POA: Diagnosis present

## 2018-05-10 DIAGNOSIS — I110 Hypertensive heart disease with heart failure: Secondary | ICD-10-CM | POA: Insufficient documentation

## 2018-05-10 DIAGNOSIS — E119 Type 2 diabetes mellitus without complications: Secondary | ICD-10-CM | POA: Diagnosis not present

## 2018-05-10 DIAGNOSIS — Z79899 Other long term (current) drug therapy: Secondary | ICD-10-CM | POA: Diagnosis not present

## 2018-05-10 DIAGNOSIS — Z7901 Long term (current) use of anticoagulants: Secondary | ICD-10-CM | POA: Diagnosis not present

## 2018-05-10 DIAGNOSIS — Z950 Presence of cardiac pacemaker: Secondary | ICD-10-CM | POA: Insufficient documentation

## 2018-05-10 DIAGNOSIS — I5042 Chronic combined systolic (congestive) and diastolic (congestive) heart failure: Secondary | ICD-10-CM | POA: Diagnosis not present

## 2018-05-10 NOTE — ED Provider Notes (Signed)
[facility] EMERGENCY DEPARTMENT Provider Note   CSN: [id number] Arrival date & time: 05/10/18  1216    History   Chief Complaint Chief Complaint  Patient presents with  . Epistaxis    HPI Denise [name] is a 85 y.o. female who presents with a nosebleed.  Past medical history significant for A. fib on Coumadin, CHF, diabetes, hypertension, mitral regurg, pulmonary hypertension.  Patient is accompanied by her son who she lives with.  She has chronic generalized weakness however she is becoming more weak because she has been having several nosebleeds which is waking her up at night.  They initially saw their doctor yesterday and had blood work and INR checked and were told to go to the ER if she had a nosebleed again.  Her hemoglobin was normal yesterday and INR was elevated to 3.36 and she was instructed to hold her Coumadin which she did.  Her nose started to bleed again this morning and so they went to the ED in [place]. Since her nose was not bleeding anymore they were offered packing of the nose however she declined and was given phenylephrine spray. She was complaining of weakness again and so blood work and UA was checked which were normal. Repeat INR is still elevated to 3.1.  She was also found to be mildly dehydrated and was given a fluid bolus.  As soon as they went home her nose started bleeding again and blood was coming out of her mouth..  Her son states that it took him about 2 hours to get it to stop bleeding.  He called their cardiologist's office and they told him to come to the ED for a second opinion. Her weakness has gotten worse and he states that she is normally very independent but he had to put her in a wheelchair for the first time today.  She was given follow-up with ENT.     HPI  Past Medical History:  Diagnosis Date  . CHF (congestive heart failure) (HCC)   . Diabetes mellitus without complication (HCC)   . Hypertension   . LVH (left  ventricular hypertrophy)   . Mitral regurgitation    mod to severe  . Permanent atrial fibrillation   . Pulmonary hypertension (HCC)    severe  . Thyroid disease   . Tricuspid regurgitation    severe    Patient Active Problem List   Diagnosis Date Noted  . Pacemaker battery depletion 08/15/2017  . Chronic anticoagulation - coumadin, CHADS2VASC=5 06/07/2015  . Chronic combined systolic and diastolic CHF, NYHA class 2 (HCC) 06/07/2015  . Heme positive stool 06/07/2015  . GIB (gastrointestinal bleeding) 06/07/2015  . Hyperlipidemia 04/11/2015  . Chronic combined systolic and diastolic heart failure (HCC) 01/08/2015  . Long term current use of anticoagulant therapy 02/28/2013  . Nonischemic cardiomyopathy (HCC) 10/20/2012  . CHB (complete heart block) (HCC) 10/20/2012  . Pacemaker 09/06/2012  . Fracture of right hip (HCC) 06/05/2012  . Fracture of right humerus 06/05/2012  . Atrial fibrillation (HCC) 06/05/2012  . DM (diabetes mellitus) (HCC) 06/05/2012  . HTN (hypertension) 06/05/2012  . Hypertension 06/05/2012  . Warfarin-induced coagulopathy (HCC) 06/05/2012    Past Surgical History:  Procedure Laterality Date  . ABDOMINAL HYSTERECTOMY    . CARDIOVERSION  5 /18/ 2000   unsuccessful  . CHOLECYSTECTOMY    . COMPRESSION HIP SCREW Right 06/06/2012   Procedure: Open Reduction Internal Fixation Right HIp;  Surgeon: Dannielle Huh, MD;  Location: [facility] OR;  Service: Orthopedics;  Laterality: Right;  . ESOPHAGOGASTRODUODENOSCOPY N/A 06/09/2015   Procedure: ESOPHAGOGASTRODUODENOSCOPY (EGD);  Surgeon: Charlott Rakes, MD;  Location: [facility] ENDOSCOPY;  Service: Endoscopy;  Laterality: N/A;  . knee replacement     R  . NM MYOCAR PERF WALL MOTION  02/28/2009   No signficant ischemia  . PACEMAKER INSERTION  05/26/2004   [facility]  . PPM GENERATOR CHANGEOUT N/A 08/17/2017   Procedure: PPM GENERATOR CHANGEOUT;  Surgeon: Thurmon Fair, MD;  Location: [facility] INVASIVE CV LAB;  Service: Cardiovascular;   Laterality: N/A;  . THYROIDECTOMY    . TUBAL LIGATION    . US ECHOCARDIOGRAPHY  02/25/2011   mod to severe MR,LA severe dilated,mild annular ca+,mod. TR,mod PI,mod ca+ of the aortic valve leaflet     OB History   No obstetric history on file.      Home Medications    Prior to Admission medications   Medication Sig Start Date End Date Taking? Authorizing Provider  amLODipine (NORVASC) 5 MG tablet TAKE 1 TABLET (5 MG TOTAL) BY MOUTH EVERY MORNING. 03/14/18   Lennette Bihari, MD  atorvastatin (LIPITOR) 10 MG tablet Take 10 mg by mouth every evening.    [provider]  furosemide (LASIX) 40 MG tablet TAKE 1 TABLET (40 MG TOTAL) BY MOUTH 3 (THREE) TIMES A WEEK. MONDAYS, WEDNESDAYS, AND FRIDAYS Patient taking differently: Take 40 mg by mouth every Monday, Wednesday, and Friday.  03/15/17 09/22/17  Lennette Bihari, MD  hydrochlorothiazide (HYDRODIURIL) 25 MG tablet Take 0.5 tablets (12.5 mg total) by mouth every morning. Patient taking differently: Take 25 mg by mouth every morning.  03/31/16   Lennette Bihari, MD  isosorbide mononitrate (IMDUR) 30 MG 24 hr tablet Take 1 tablet (30 mg total) by mouth every morning. Patient taking differently: Take 15 mg by mouth every morning.  03/29/17   Croitoru, Mihai, MD  methimazole (TAPAZOLE) 10 MG tablet Take 5 mg by mouth daily. 07/28/17   [provider]  metoprolol succinate (TOPROL-XL) 50 MG 24 hr tablet Take 1 tablet (50 mg total) by mouth daily. Take with or immediately following a meal. 08/24/17   Croitoru, Mihai, MD  potassium chloride SA (K-DUR,KLOR-CON) 20 MEQ tablet Take 1-2 tablets (20-40 mEq total) by mouth daily as directed Patient taking differently: Take 20-40 mEq by mouth See admin instructions. Take 40 meq by mouth on Monday, Wednesday and Friday. Take 10 meq by mouth daily on all other days 04/12/17   Croitoru, Mihai, MD  ramipril (ALTACE) 5 MG capsule Take 1 capsule (5 mg total) by mouth daily. 04/12/18   Lennette Bihari,  MD  warfarin (COUMADIN) 5 MG tablet Take 1/2 to 1 tablet by mouth daily or as directed by coumadin clinic 12/06/17   Croitoru, Rachelle Hora, MD    Family History Family History  Problem Relation Age of Onset  . Asthma Mother   . Heart attack Father   . Stroke Sister   . Heart attack Brother   . Cancer - Colon Brother   . Gallbladder disease Maternal Grandmother   . Stroke Maternal Grandfather   . Heart disease Child     Social History Social History   Tobacco Use  . Smoking status: Never Smoker  . Smokeless tobacco: Never Used  Substance Use Topics  . Alcohol use: No  . Drug use: No     Allergies   Iohexol and Shrimp [shellfish allergy]   Review of Systems Review of Systems  Constitutional: Negative for fever.  HENT: Positive for nosebleeds.   Respiratory: Negative for shortness of breath.   Cardiovascular: Negative for chest pain.  Gastrointestinal: Negative for abdominal pain.  Neurological: Positive for weakness.  All other systems reviewed and are negative.    Physical Exam Updated Vital Signs BP 124/67 (BP Location: Left Arm)   Pulse 73   Temp 98.7 F (37.1 C) (Oral)   Resp 16   Ht 5\' 2"  (1.575 m)   Wt 72.6 kg   SpO2 96%   BMI 29.26 kg/m   Physical Exam Vitals signs and nursing note reviewed.  Constitutional:      General: She is not in acute distress.    Appearance: She is well-developed. She is ill-appearing (chronically ill appearing\).  HENT:     Head: Normocephalic and atraumatic.     Comments: Dried blood in the nares without active bleeding. No blood in oropharynx    Nose: Nose normal.     Right Nostril: No epistaxis.     Left Nostril: No epistaxis.  Eyes:     General: No scleral icterus.       Right eye: No discharge.        Left eye: No discharge.     Conjunctiva/sclera: Conjunctivae normal.     Pupils: Pupils are equal, round, and reactive to light.  Neck:     Musculoskeletal: Normal range of motion.  Cardiovascular:     Rate and  Rhythm: Normal rate.  Pulmonary:     Effort: Pulmonary effort is normal. No respiratory distress.  Abdominal:     General: There is no distension.  Skin:    General: Skin is warm and dry.  Neurological:     Mental Status: She is alert and oriented to person, place, and time.  Psychiatric:        Behavior: Behavior normal.      ED Treatments / Results  Labs (all labs ordered are listed, but only abnormal results are displayed) Labs Reviewed - No data to display  EKG None  Radiology No results found.  Procedures Procedures (including critical care time)  Medications Ordered in ED Medications - No data to display   Initial Impression / Assessment and Plan / ED Course  I have reviewed the triage vital signs and the nursing notes.  Pertinent labs & imaging results that were available during my care of the patient were reviewed by me and considered in my medical decision making (see chart for details).  85 year old female presents with recurrent epistaxis.  She is not having any bleeding here.  Her vital signs are normal.  She has also had 2 evaluations in the past day for the same.  She has had her hemoglobin checked twice which have both been been normal.  Her INR is high and she has been holding her Coumadin.  I do not see any active bleeding on exam and I do not see any source of bleeding.  Her son is upset because he was thought that he was sent here for someone to look in the back of her nose.  I explained to him that we do not have the equipment to do this and he needs to see ENT.  He was given follow-up with ENT at the last ED but did not realize this.  He states he would prefer to see someone in [place].  He was given follow-up with ENT here and encouraged to call them today.  He was encouraged to return to the ED if she  starts to have another nosebleed.  Final Clinical Impressions(s) / ED Diagnoses   Final diagnoses:  Epistaxis    ED Discharge Orders    None         Bethel Born, PA-C 05/10/18 1620    Raeford Razor, MD 05/11/18 [phone]

## 2018-05-10 NOTE — Telephone Encounter (Signed)
Pt's son stated that he took her to the er due to nose bleed, but they sent her home. Pt's son also stated that pt is getting really weak and having bleeding coming out of her mouth. He wants to know what he should do as she is getting very weak.

## 2018-05-10 NOTE — Telephone Encounter (Signed)
This encounter was created in error - please disregard.

## 2018-05-10 NOTE — ED Triage Notes (Addendum)
Pt arrives POV. Pt began having a nosebleed beginning Sunday night and had 3 nosebleeds Monday night. Pt has not taken coumadin today. Pt seen at [facility] this am and blood work was done. Son states he has spoken with pt cardiologist and they are planning to change blood thinner to eliquis.

## 2018-05-10 NOTE — Telephone Encounter (Signed)
Pt's son stated that he took her to the er due to bleeding but they sent her home. Pt's son also stated that pt is getting really weak and having bleeding coming out of her mouth. They also wanted to know if Baxter Hire wants them to bring the pt to the office that they would oblige or do whatever Belenda Cruise believes is necessary. Will route to Penni Bombard D because Pt's son asked for her specifically.

## 2018-05-10 NOTE — Discharge Instructions (Signed)
Call ENT for an appointment Use the nasal spray every 6 hours  If your nose bleeds again, use the spray, hold the nose for 20 minutes. If it still bleeds after that, come back to the ED

## 2018-05-10 NOTE — Telephone Encounter (Signed)
Patient son called Triage as well, see other telephone note from today.

## 2018-05-10 NOTE — Telephone Encounter (Signed)
Called patient, spoke with son. Who states that his mother is having conintued issues with bleeding from her nose and now from her mouth. Son advised that they went to the ER in [place] early this morning and they sent her hoome only gave her fluids. Son states that he is concerned, and would like to know what to do. I spoke with Belenda Cruise PharmD regarding this issues since patient is a coumadin patient. They advised me to tell him to have another opinion from a different hospital if possible. We have to have the bleeding stopped, before other options can be made. Spoke with son who agreed to bring patient to the [facility] ER if he was able to get his mother out of the bed and could get her in the car. Patient son was advised to take her somewhere closer but son would rather come to [place] area. I notified PharmD, agreed with plan.

## 2018-05-12 ENCOUNTER — Telehealth: Payer: Self-pay | Admitting: Pharmacist

## 2018-05-12 ENCOUNTER — Telehealth: Payer: Self-pay | Admitting: Pharmacist Clinician (PhC)/ Clinical Pharmacy Specialist

## 2018-05-12 NOTE — Telephone Encounter (Signed)
Patient last dose of warfarin was Tuesday AM.  INR Tuesday was 3.3.  Nosebleeds have decreased significantly, so reports 2 episodes since Tuesday, both short lived.    States she is still weak and he refuses to take her out of the house until she feels better.  Has declined to see ENT referrals offered by both ED in [place] and [place].   Told son is okay for patient to start Eliquis 5 mg bid tomorrow morning (05/13/2018).  He should hold 1-2 doses if nosebleeds continue tomorrow.  Son voiced understanding.

## 2018-05-12 NOTE — Telephone Encounter (Signed)
Son called back after we spoke earlier today, states patient noted she had black stool earlier this afternoon.  Patient was also on the phone, stated it was just the one stool.    Suspect that this is residual from severe nosebleed several days ago.  Patient swallowed blood during nosebleed.  Advised that they continue watching stool closely and should it remain dark over next several days they should seek medical attention.   Son voiced understanding

## 2018-05-12 NOTE — Telephone Encounter (Signed)
Son calling in behalf f of Denise Denise [name]. They received Eliquis but he is requesting instructions for warfarin and Eliquis.  Last day Denise [name] had anticoagulation was Monday 05/09/2018.

## 2018-05-16 ENCOUNTER — Other Ambulatory Visit: Payer: Self-pay

## 2018-05-16 MED ORDER — POTASSIUM CHLORIDE CRYS ER 20 MEQ PO TBCR: EXTENDED_RELEASE_TABLET | ORAL | 3 refills | Status: DC

## 2018-05-16 NOTE — Telephone Encounter (Signed)
Rx(s) sent to pharmacy electronically.  

## 2018-05-31 ENCOUNTER — Other Ambulatory Visit: Payer: Self-pay

## 2018-05-31 ENCOUNTER — Ambulatory Visit (INDEPENDENT_AMBULATORY_CARE_PROVIDER_SITE_OTHER): Payer: Medicare Other | Admitting: *Deleted

## 2018-05-31 DIAGNOSIS — I442 Atrioventricular block, complete: Secondary | ICD-10-CM

## 2018-06-01 LAB — CUP PACEART REMOTE DEVICE CHECK
Battery Remaining Longevity: 124 mo
Battery Remaining Percentage: 95.5 %
Battery Voltage: 3.02 V
Brady Statistic RV Percent Paced: 97 %
Date Time Interrogation Session: 20200317060022
Implantable Lead Implant Date: 20060313
Implantable Lead Location: 753859
Implantable Lead Location: 753860
Implantable Pulse Generator Implant Date: 20190604
Lead Channel Impedance Value: 450 Ohm
Lead Channel Pacing Threshold Amplitude: 1 V
Lead Channel Pacing Threshold Pulse Width: 0.5 ms
Lead Channel Sensing Intrinsic Amplitude: 9.4 mV
Lead Channel Setting Pacing Amplitude: 1.25 V
Lead Channel Setting Pacing Pulse Width: 0.5 ms
Lead Channel Setting Sensing Sensitivity: 4 mV
MDC IDC LEAD IMPLANT DT: 20060313
MDC IDC PG SERIAL: 9029685
Pulse Gen Model: 2272

## 2018-06-04 ENCOUNTER — Other Ambulatory Visit: Payer: Self-pay | Admitting: Cardiovascular Disease

## 2018-06-08 ENCOUNTER — Encounter: Payer: Self-pay | Admitting: Cardiology

## 2018-06-08 NOTE — Progress Notes (Signed)
Remote pacemaker transmission.   

## 2018-06-17 ENCOUNTER — Inpatient Hospital Stay (HOSPITAL_COMMUNITY)
Admission: AD | Admit: 2018-06-17 | Discharge: 2018-06-21 | DRG: 291 | Disposition: A | Discharge status: Home Health | Payer: Medicare Other | Source: Other Acute Inpatient Hospital | Attending: Internal Medicine | Admitting: Internal Medicine

## 2018-06-17 ENCOUNTER — Other Ambulatory Visit: Payer: Self-pay

## 2018-06-17 ENCOUNTER — Other Ambulatory Visit: Payer: Self-pay | Admitting: Cardiovascular Disease

## 2018-06-17 ENCOUNTER — Observation Stay (HOSPITAL_COMMUNITY): Payer: Medicare Other

## 2018-06-17 ENCOUNTER — Encounter (HOSPITAL_COMMUNITY): Payer: Self-pay

## 2018-06-17 ENCOUNTER — Ambulatory Visit: Payer: Medicare Other | Admitting: Cardiovascular Disease

## 2018-06-17 DIAGNOSIS — I509 Heart failure, unspecified: Secondary | ICD-10-CM

## 2018-06-17 DIAGNOSIS — I5043 Acute on chronic combined systolic (congestive) and diastolic (congestive) heart failure: Secondary | ICD-10-CM | POA: Diagnosis present

## 2018-06-17 DIAGNOSIS — E0520 Thyrotoxicosis with toxic multinodular goiter without thyrotoxic crisis or storm: Secondary | ICD-10-CM | POA: Diagnosis present

## 2018-06-17 DIAGNOSIS — Z90710 Acquired absence of both cervix and uterus: Secondary | ICD-10-CM

## 2018-06-17 DIAGNOSIS — I272 Pulmonary hypertension, unspecified: Secondary | ICD-10-CM | POA: Diagnosis present

## 2018-06-17 DIAGNOSIS — Z950 Presence of cardiac pacemaker: Secondary | ICD-10-CM

## 2018-06-17 DIAGNOSIS — Z823 Family history of stroke: Secondary | ICD-10-CM

## 2018-06-17 DIAGNOSIS — Z9049 Acquired absence of other specified parts of digestive tract: Secondary | ICD-10-CM

## 2018-06-17 DIAGNOSIS — I081 Rheumatic disorders of both mitral and tricuspid valves: Secondary | ICD-10-CM | POA: Diagnosis present

## 2018-06-17 DIAGNOSIS — Z888 Allergy status to other drugs, medicaments and biological substances status: Secondary | ICD-10-CM

## 2018-06-17 DIAGNOSIS — I4891 Unspecified atrial fibrillation: Secondary | ICD-10-CM | POA: Diagnosis present

## 2018-06-17 DIAGNOSIS — E079 Disorder of thyroid, unspecified: Secondary | ICD-10-CM

## 2018-06-17 DIAGNOSIS — E8770 Fluid overload, unspecified: Secondary | ICD-10-CM | POA: Diagnosis present

## 2018-06-17 DIAGNOSIS — E876 Hypokalemia: Secondary | ICD-10-CM | POA: Diagnosis present

## 2018-06-17 DIAGNOSIS — K760 Fatty (change of) liver, not elsewhere classified: Secondary | ICD-10-CM | POA: Diagnosis present

## 2018-06-17 DIAGNOSIS — I130 Hypertensive heart and chronic kidney disease with heart failure and stage 1 through stage 4 chronic kidney disease, or unspecified chronic kidney disease: Principal | ICD-10-CM | POA: Diagnosis present

## 2018-06-17 DIAGNOSIS — Z825 Family history of asthma and other chronic lower respiratory diseases: Secondary | ICD-10-CM

## 2018-06-17 DIAGNOSIS — E1122 Type 2 diabetes mellitus with diabetic chronic kidney disease: Secondary | ICD-10-CM | POA: Diagnosis present

## 2018-06-17 DIAGNOSIS — E785 Hyperlipidemia, unspecified: Secondary | ICD-10-CM | POA: Diagnosis present

## 2018-06-17 DIAGNOSIS — N183 Chronic kidney disease, stage 3 (moderate): Secondary | ICD-10-CM | POA: Diagnosis present

## 2018-06-17 DIAGNOSIS — Z7901 Long term (current) use of anticoagulants: Secondary | ICD-10-CM

## 2018-06-17 DIAGNOSIS — R778 Other specified abnormalities of plasma proteins: Secondary | ICD-10-CM

## 2018-06-17 DIAGNOSIS — Z66 Do not resuscitate: Secondary | ICD-10-CM | POA: Diagnosis present

## 2018-06-17 DIAGNOSIS — Z8249 Family history of ischemic heart disease and other diseases of the circulatory system: Secondary | ICD-10-CM

## 2018-06-17 DIAGNOSIS — R7989 Other specified abnormal findings of blood chemistry: Secondary | ICD-10-CM

## 2018-06-17 DIAGNOSIS — Z79899 Other long term (current) drug therapy: Secondary | ICD-10-CM

## 2018-06-17 DIAGNOSIS — Z96651 Presence of right artificial knee joint: Secondary | ICD-10-CM | POA: Diagnosis present

## 2018-06-17 DIAGNOSIS — K429 Umbilical hernia without obstruction or gangrene: Secondary | ICD-10-CM | POA: Diagnosis present

## 2018-06-17 DIAGNOSIS — Z91013 Allergy to seafood: Secondary | ICD-10-CM

## 2018-06-17 DIAGNOSIS — I442 Atrioventricular block, complete: Secondary | ICD-10-CM | POA: Diagnosis present

## 2018-06-17 DIAGNOSIS — R140 Abdominal distension (gaseous): Secondary | ICD-10-CM

## 2018-06-17 DIAGNOSIS — I4821 Permanent atrial fibrillation: Secondary | ICD-10-CM | POA: Diagnosis present

## 2018-06-17 DIAGNOSIS — I10 Essential (primary) hypertension: Secondary | ICD-10-CM | POA: Diagnosis present

## 2018-06-17 DIAGNOSIS — Z9181 History of falling: Secondary | ICD-10-CM

## 2018-06-17 DIAGNOSIS — M7122 Synovial cyst of popliteal space [Baker], left knee: Secondary | ICD-10-CM | POA: Diagnosis present

## 2018-06-17 DIAGNOSIS — R188 Other ascites: Secondary | ICD-10-CM | POA: Diagnosis present

## 2018-06-17 DIAGNOSIS — N1831 Chronic kidney disease, stage 3a: Secondary | ICD-10-CM | POA: Diagnosis present

## 2018-06-17 HISTORY — DX: Presence of cardiac pacemaker: Z95.0

## 2018-06-17 LAB — TROPONIN I: Troponin I: 0.04 ng/mL (ref <0.03)

## 2018-06-17 MED ORDER — SODIUM CHLORIDE 0.9% FLUSH
3.0000 mL | Freq: Two times a day (BID) | INTRAVENOUS | Status: DC
  Administered 2018-06-17 – 2018-06-21 (×8): 3 mL via INTRAVENOUS

## 2018-06-17 MED ORDER — HYDROCHLOROTHIAZIDE 12.5 MG PO CAPS
12.5000 mg | ORAL_CAPSULE | Freq: Every morning | ORAL | Status: DC
  Administered 2018-06-18: 12.5 mg via ORAL
  Filled 2018-06-17: qty 1

## 2018-06-17 MED ORDER — SENNOSIDES-DOCUSATE SODIUM 8.6-50 MG PO TABS
1.0000 | ORAL_TABLET | Freq: Two times a day (BID) | ORAL | Status: DC
  Administered 2018-06-17 – 2018-06-21 (×7): 1 via ORAL
  Filled 2018-06-17 (×8): qty 1

## 2018-06-17 MED ORDER — ATORVASTATIN CALCIUM 10 MG PO TABS
10.0000 mg | ORAL_TABLET | Freq: Every evening | ORAL | Status: DC
  Administered 2018-06-17 – 2018-06-20 (×4): 10 mg via ORAL
  Filled 2018-06-17 (×4): qty 1

## 2018-06-17 MED ORDER — ISOSORBIDE MONONITRATE ER 30 MG PO TB24
15.0000 mg | ORAL_TABLET | Freq: Every morning | ORAL | Status: DC
  Administered 2018-06-18 – 2018-06-21 (×4): 15 mg via ORAL
  Filled 2018-06-17 (×4): qty 1

## 2018-06-17 MED ORDER — METHIMAZOLE 5 MG PO TABS
5.0000 mg | ORAL_TABLET | Freq: Every day | ORAL | Status: DC
  Administered 2018-06-18 – 2018-06-21 (×4): 5 mg via ORAL
  Filled 2018-06-17 (×4): qty 1

## 2018-06-17 MED ORDER — ACETAMINOPHEN 650 MG RE SUPP: 650.0000 mg | Freq: Four times a day (QID) | RECTAL | Status: DC | PRN

## 2018-06-17 MED ORDER — RAMIPRIL 2.5 MG PO CAPS
5.0000 mg | ORAL_CAPSULE | Freq: Every day | ORAL | Status: DC
  Administered 2018-06-18 – 2018-06-21 (×4): 5 mg via ORAL
  Filled 2018-06-17 (×5): qty 2

## 2018-06-17 MED ORDER — AMLODIPINE BESYLATE 2.5 MG PO TABS
5.0000 mg | ORAL_TABLET | Freq: Every morning | ORAL | Status: DC
  Filled 2018-06-17: qty 2

## 2018-06-17 MED ORDER — APIXABAN 5 MG PO TABS
5.0000 mg | ORAL_TABLET | Freq: Two times a day (BID) | ORAL | Status: DC
  Administered 2018-06-17 – 2018-06-21 (×8): 5 mg via ORAL
  Filled 2018-06-17 (×8): qty 1

## 2018-06-17 MED ORDER — POTASSIUM CHLORIDE 20 MEQ/15ML (10%) PO SOLN
60.0000 meq | Freq: Once | ORAL | Status: AC
  Administered 2018-06-17: 60 meq via ORAL
  Filled 2018-06-17: qty 45

## 2018-06-17 MED ORDER — FUROSEMIDE 10 MG/ML IJ SOLN
40.0000 mg | Freq: Two times a day (BID) | INTRAMUSCULAR | Status: DC
  Administered 2018-06-17 – 2018-06-19 (×5): 40 mg via INTRAVENOUS
  Filled 2018-06-17 (×5): qty 4

## 2018-06-17 MED ORDER — METOPROLOL SUCCINATE ER 50 MG PO TB24
50.0000 mg | ORAL_TABLET | Freq: Every day | ORAL | Status: DC
  Administered 2018-06-18 – 2018-06-21 (×4): 50 mg via ORAL
  Filled 2018-06-17 (×5): qty 1

## 2018-06-17 MED ORDER — FUROSEMIDE 40 MG PO TABS: 40.0000 mg | ORAL_TABLET | ORAL | 3 refills | Status: DC

## 2018-06-17 MED ORDER — ACETAMINOPHEN 325 MG PO TABS: 650.0000 mg | ORAL_TABLET | Freq: Four times a day (QID) | ORAL | Status: DC | PRN

## 2018-06-17 NOTE — H&P (Signed)
History and Physical    Denise [name] YIR:[id number] DOB: [birth year] DOA: 06/17/2018  PCP: Burnard Bunting, MD  Patient coming from: [facility] emergency department via PCP office  I have personally briefly reviewed patient's old medical records in [place]  Chief Complaint: Abdominal swelling  HPI: Denise [name] is a 85 y.o. female with medical history significant for permanent atrial fibrillation on Eliquis s/p AV node ablation 08/2004 now PPM dependent, chronic combined systolic and diastolic CHF (EF 50-09%), CHB, HTN, CKD Stage 2-3, and toxic multinodular goiter who initially presented to her PCP office with reported shortness of breath and abdominal distention.  There was concern for exacerbation of her CHF and she was sent to [facility] ED for further evaluation.  Patient states that over the last 2-3 weeks she has noticed increased swelling of her abdomen and both of her legs.  She has not had any abdominal pain.  She normally ambulates with a walker and had a mechanical fall about 1 week ago without injury.  She says she has not really been feeling short of breath.  She normally sleeps with one pillow under her head at night which has not changed recently.  She is prescribed Lasix 40 mg 3 times a week.  She has been taking Lasix daily and decreased her fluid intake to try to limit fluid overload.  She denies any orthopnea, paroxysmal nocturnal dyspnea, chest pain, palpitations, dysuria, diarrhea, nausea, vomiting, fevers, chills, diaphoresis, cough.  She was previously on Coumadin for anticoagulation, however was recently switched to Eliquis due to issues with nosebleeds.  She denies any further obvious bleeding.  [place] ED Course:  ED vitals showed BP 128/64, pulse 70, RR 16, temp 97.9 Fahrenheit, SPO2 94% on room air.  Labs were notable for sodium 137, potassium 3.0, chloride 95, bicarb 31, BUN 27, creatinine 1.0, GFR 49,  albumin 3.2, alk phos 178, ALT 13, AST 27, T bili 1.3, serum glucose 114, WBC 74, hemoglobin 12.7, platelets 317, troponin I 0.06, BNP 370, d-dimer 3280, lipase 27.  2 view chest x-ray showed stable moderate cardiomegaly with left-sided subclavian pacemaker in place, no effusion, consolidation, or evidence of acute process.  CT abdomen/pelvis without contrast was negative for acute intra-abdominal or pelvic process.  Moderate ascites and fatty liver was noted.  Bilateral lower extremity venous Doppler ultrasound were obtained and were negative for DVT in either lower extremity.  A left-sided popliteal cyst was seen.  Patient was transferred to [facility] for ACS rule out given elevated troponin and for management of suspected CHF exacerbation.  Review of Systems: As per HPI otherwise 10 point review of systems negative.    Past Medical History:  Diagnosis Date  . CHF (congestive heart failure) ([place])   . Diabetes mellitus without complication ([place])   . Hypertension   . LVH (left ventricular hypertrophy)   . Mitral regurgitation    mod to severe  . Permanent atrial fibrillation   . Presence of permanent cardiac pacemaker   . Pulmonary hypertension (HCC)    severe  . Thyroid disease   . Tricuspid regurgitation    severe    Past Surgical History:  Procedure Laterality Date  . ABDOMINAL HYSTERECTOMY    . CARDIOVERSION  5 /18/ 2000   unsuccessful  . CHOLECYSTECTOMY    . COMPRESSION HIP SCREW Right 06/06/2012   Procedure: Open Reduction Internal Fixation Right HIp;  Surgeon: Vickey Huger, MD;  Location: [facility];  Service: Orthopedics;  Laterality: Right;  . ESOPHAGOGASTRODUODENOSCOPY N/A 06/09/2015   Procedure: ESOPHAGOGASTRODUODENOSCOPY (EGD);  Surgeon: Wilford Corner, MD;  Location[facility] ENDOSCOPY;  Service: Endoscopy;  Laterality: N/A;  . knee replacement     R  . NM MYOCAR PERF WALL MOTION  02/28/2009   No signficant ischemia  . PACEMAKER INSERTION  05/26/2004   [place]   . PPM GENERATOR CHANGEOUT N/A 08/17/2017   Procedure: PPM GENERATOR CHANGEOUT;  Surgeon: Sanda Klein, MD;  Location: [facility] CV [facility];  Service: Cardiovascular;  Laterality: N/A;  . THYROIDECTOMY    . TUBAL LIGATION    . US ECHOCARDIOGRAPHY  02/25/2011   mod to severe MR,LA severe dilated,mild annular ca+,mod. TR,mod PI,mod ca+ of the aortic valve leaflet     reports that she has never smoked. She has never used smokeless tobacco. She reports that she does not drink alcohol or use drugs.  Allergies  Allergen Reactions  . Iohexol Rash and Other (See Comments)     Desc: VOMITING-VERIFIED ON 05-24-04 PRE-PROCEDURE/ ARS   . Shrimp [Shellfish Allergy] Nausea And Vomiting and Rash    Family History  Problem Relation Age of Onset  . Asthma Mother   . Heart attack Father   . Stroke Sister   . Heart attack Brother   . Cancer - Colon Brother   . Gallbladder disease Maternal Grandmother   . Stroke Maternal Grandfather   . Heart disease Child      Prior to Admission medications   Medication Sig Start Date End Date Taking? Authorizing Provider  amLODipine (NORVASC) 5 MG tablet TAKE 1 TABLET (5 MG TOTAL) BY MOUTH EVERY MORNING. 03/14/18   Troy Sine, MD  atorvastatin (LIPITOR) 10 MG tablet Take 10 mg by mouth every evening.    [provider]  ELIQUIS 5 MG TABS tablet TAKE 1 TABLET BY MOUTH 2 TIMES A DAY 06/06/18   Troy Sine, MD  furosemide (LASIX) 40 MG tablet Take 1 tablet (40 mg total) by mouth 3 (three) times a week. Mondays, Wednesdays, and Fridays 06/17/18 09/15/18  Troy Sine, MD  hydrochlorothiazide (HYDRODIURIL) 25 MG tablet Take 0.5 tablets (12.5 mg total) by mouth every morning. Patient taking differently: Take 25 mg by mouth every morning.  03/31/16   Troy Sine, MD  isosorbide mononitrate (IMDUR) 30 MG 24 hr tablet Take 1 tablet (30 mg total) by mouth every morning. Patient taking differently: Take 15 mg by mouth every morning.  03/29/17   Croitoru,  Mihai, MD  methimazole (TAPAZOLE) 10 MG tablet Take 5 mg by mouth daily. 07/28/17   [provider]  metoprolol succinate (TOPROL-XL) 50 MG 24 hr tablet Take 1 tablet (50 mg total) by mouth daily. Take with or immediately following a meal. 08/24/17   Croitoru, Mihai, MD  potassium chloride SA (K-DUR,KLOR-CON) 20 MEQ tablet Take 1-2 tablets (20-40 mEq total) by mouth daily as directed. 05/16/18   Troy Sine, MD  ramipril (ALTACE) 5 MG capsule Take 1 capsule (5 mg total) by mouth daily. 04/12/18   Troy Sine, MD  warfarin (COUMADIN) 5 MG tablet Take 1/2 to 1 tablet by mouth daily or as directed by coumadin clinic 12/06/17   Croitoru, Dani Gobble, MD    Physical Exam: Vitals:   06/17/18 2012  BP: 130/68  Pulse: 71  Resp: 16  Temp: 98.6 F (37 C)  TempSrc: Oral  SpO2: 97%    Constitutional: Elderly woman resting comfortably supine in bed, NAD, calm Eyes:  PERRL, lids and conjunctivae normal ENMT: Mucous membranes are moist. Posterior pharynx clear of any exudate or lesions.Normal dentition.  Neck: normal, supple, no masses. Respiratory: Minimal bibasilar inspiratory crackles, no wheezing. Normal respiratory effort. No accessory muscle use.  Cardiovascular: Regular rate and rhythm, 2/6 systolic murmur present.  +2 edema both legs.  PPM in place left chest wall. Abdomen: Distended but soft, no tenderness, no masses palpated. No hepatosplenomegaly. Bowel sounds diminished.  Reducible small umbilical hernia. Musculoskeletal: no clubbing / cyanosis. No joint deformity upper and lower extremities. Good ROM, no contractures. Normal muscle tone.  Skin: no rashes, lesions, ulcers. No induration Neurologic: CN 2-12 grossly intact. Sensation intact, Strength 5/5 in all 4.  Psychiatric: Normal judgment and insight. Alert and oriented x 3. Normal mood.   Labs on Admission: I have personally reviewed following labs and imaging studies  CBC: No results for input(s): WBC, NEUTROABS, HGB, HCT,  MCV, PLT in the last 168 hours. Basic Metabolic Panel: No results for input(s): NA, K, CL, CO2, GLUCOSE, BUN, CREATININE, CALCIUM, MG, PHOS in the last 168 hours. GFR: CrCl cannot be calculated (Patient's most recent lab result is older than the maximum 21 days allowed.). Liver Function Tests: No results for input(s): AST, ALT, ALKPHOS, BILITOT, PROT, ALBUMIN in the last 168 hours. No results for input(s): LIPASE, AMYLASE in the last 168 hours. No results for input(s): AMMONIA in the last 168 hours. Coagulation Profile: No results for input(s): INR, PROTIME in the last 168 hours. Cardiac Enzymes: No results for input(s): CKTOTAL, CKMB, CKMBINDEX, TROPONINI in the last 168 hours. BNP (last 3 results) No results for input(s): PROBNP in the last 8760 hours. HbA1C: No results for input(s): HGBA1C in the last 72 hours. CBG: No results for input(s): GLUCAP in the last 168 hours. Lipid Profile: No results for input(s): CHOL, HDL, LDLCALC, TRIG, CHOLHDL, LDLDIRECT in the last 72 hours. Thyroid Function Tests: No results for input(s): TSH, T4TOTAL, FREET4, T3FREE, THYROIDAB in the last 72 hours. Anemia Panel: No results for input(s): VITAMINB12, FOLATE, FERRITIN, TIBC, IRON, RETICCTPCT in the last 72 hours. Urine analysis:    Component Value Date/Time   COLORURINE YELLOW 06/08/2012 1657   APPEARANCEUR CLEAR 06/08/2012 1657   LABSPEC 1.024 06/08/2012 1657   PHURINE 5.0 06/08/2012 1657   GLUCOSEU NEGATIVE 06/08/2012 1657   HGBUR NEGATIVE 06/08/2012 1657   BILIRUBINUR NEGATIVE 06/08/2012 1657   KETONESUR NEGATIVE 06/08/2012 1657   PROTEINUR NEGATIVE 06/08/2012 1657   UROBILINOGEN 0.2 06/08/2012 1657   NITRITE NEGATIVE 06/08/2012 1657   LEUKOCYTESUR NEGATIVE 06/08/2012 1657    Radiological Exams on Admission: No results found.  CT OF THE ABDOMEN AND PELVIS WITHOUT INTRAVENOUS CONTRAST, 06/17/2018 .  TECHNIQUE: Without the administration of oral contrast, 5 mm scans obtained through the  abdomen and pelvis without intravenous contrast. Multiplanar reconstructions were obtained.. This CT exam was performed using one or more of the following dose reduction techniques: Automated exposure control, adjustment of the mA and/or kV according to patient size, or use of iterative reconstruction technique.  CLINICAL HISTORY: . Abdominal distension Nausea/vomiting Abdominal pain, acute, nonlocalized  COMPARISON :None.  FINDINGS:   Small left-sided pleural effusion. Cardiomegaly  Abdomen: Moderate ascites. Mild to moderate diffuse fatty infiltration of the liver. Liver otherwise negative. No distinct mass. No intrahepatic or extrahepatic ductal dilatation. Spleen negative. Pancreas negative. Gallbladder removed. Simple and hyperdense cysts noted of the kidneys. No distinct mass. Kidneys otherwise negative. Adrenal glands negative.  Increased stool throughout the colon. Diverticulosis. No dilated loops  of large or small bowel. Appendix not visualized. No inflammatory process right lower quadrant.. Severe vascular calcifications the abdominal aorta and its branches. Retroperitoneum and remainder the abdomen negative.  Pelvis: Hysterectomy.. Urinary bladder negative. Degenerative changes of the lumbar spine. Right hip arthroplasty. No inguinal or iliac adenopathy. Remainder of exam negative.  IMPRESSION Negative for acute intra-abdominal or pelvic pathology. Moderate ascites. Fatty liver.  Dorian Furnace, MD 06/17/2018 2:51 PM    BILATERAL LOWER EXTREMITY VENOUS DOPPLER ULTRASOUND 06/17/2018 .  CLINICAL HISTORY: Left knee pain.  COMPARISON:None.  FINDINGS: Ultrasound images saved and reviewed. Color, spectral and Doppler images evaluated. RIGHT SIDE: Right common femoral, superficial femoral, and popliteal veins with normal augmentation, normal flow, and normal compressibility. Right greater saphenous vein negative.  LEFT SIDE: Left-sided popliteal cyst measures 5.4 x 3.6 x 1.4  cm . Left common femoral, superficial femoral, and popliteal veins with normal augmentation, normal flow, and normal compressibility. Left greater saphenous vein negative.  Remainder of exam negative.  IMPRESSION Negative for deep vein thrombosis in either lower extremity.   Dorian Furnace, MD 06/17/2018 4:02 PM   Chest, AP and lateral:  HISTORY: Acute shortness of breath.  Compared to 06/19/2017.  FINDINGS: Stable moderate cardiomegaly. Lungs are clear. No pneumothorax or effusion. Indwelling left subclavian pacemaker with the tips in the RA and RV. Osteopenia. Severe degenerative changes right shoulder.  IMPRESSION Stable chest. No evidence for acute process.  Merrilyn Puma, MD 06/17/2018 2:15 PM   EKG: Ordered and pending.  Assessment/Plan Principal Problem:   Acute on chronic combined systolic and diastolic CHF (congestive heart failure) (HCC) Active Problems:   Atrial fibrillation (HCC)   HTN (hypertension)   CHB (complete heart block) (HCC)   Thyroid disease   Acute on chronic combined systolic (congestive) and diastolic (congestive) heart failure (HCC)   Elevated troponin   Abdominal distention  Denise [name] is a 85 y.o. female with medical history significant for permanent atrial fibrillation on Eliquis s/p AV node ablation 08/2004 now PPM dependent, chronic combined systolic and diastolic CHF (EF 33-29%), CHB, HTN, CKD Stage 2-3, and toxic multinodular goiter who was transferred from [facility] emergency department due to concern for acute CHF exacerbation, and elevated troponin.  Acute on chronic combined systolic and diastolic CHF: Patient with increased lower extremity and abdominal swelling and evidence for volume overload, however is otherwise compensated from a symptomatic standpoint.  Echocardiogram on 04/28/2017 showed an EF of 50-55%. -Start IV Lasix 40 mg twice daily -Strict I/O's, daily weights, monitor renal function and electrolytes  -Continue home Toprol-XL, ramipril  Abdominal distention: Suspect underlying ascites from CHF.  No evidence of intra-abdominal infection/SBP.  Moderate ascites seen on CT scan at outside hospital with changes consistent with prior cholecystectomy.  She denies any abdominal pain. -Obtain abdominal ascites ultrasound study -IV Lasix as above -Consider paracentesis pending ultrasound study and diuretic response  Elevated troponin: Mildly elevated troponin of 0.06 at outside hospital.  She denies any chest pain.  No known history of coronary artery disease on review cardiology records. -Trend troponin -Monitor on telemetry and obtain EKG  Elevated d-dimer: D-dimer 3280 at outside hospital.  Lower extremity Dopplers were negative for DVT bilaterally.  She is currently anticoagulated on Eliquis 5 mg twice daily.  He is currently hemodynamically stable, saturating well on room air, with a paced rhythm and heart rate in the 70s on telemetry.  Have low suspicion for PE, regardless would not pursue VQ scan at this time as  it would not change current management.  Permanent atrial fibrillation s/p AV node ablation, PPM dependent: V paced rhythm on telemetry with controlled rate. -Continue Toprol-XL -Continue Eliquis  Hypokalemia: K 3.0 at outside hospital. -Supplement K 60 mEq orally -Recheck labs in a.m.  Hypertension: BP stable. -Continue home amlodipine, HCTZ, Toprol-XL, ramipril, Imdur  Hyperlipidemia: -Continue atorvastatin  Toxic multinodular goiter: -Continue methimazole  Recent fall: Patient normally ambulates with a walker, fell about 1 week ago without injury.  Likely exacerbated from increased lower extremity edema and possible left knee pain from popliteal cyst. -PT/OT eval  DVT prophylaxis: Eliquis Code Status: DNR, confirmed with patient Family Communication: None present on admission Disposition Plan: Likely discharge to home pending clinical progress Consults called:  None Admission status: Observation   Zada Finders MD [facility] Pager [phone]  If 7PM-7AM, please contact night-coverage [email or web address]  06/17/2018, 9:30 PM

## 2018-06-18 ENCOUNTER — Other Ambulatory Visit: Payer: Self-pay

## 2018-06-18 ENCOUNTER — Observation Stay (HOSPITAL_COMMUNITY): Payer: Medicare Other

## 2018-06-18 DIAGNOSIS — I4821 Permanent atrial fibrillation: Secondary | ICD-10-CM | POA: Diagnosis present

## 2018-06-18 DIAGNOSIS — Z9181 History of falling: Secondary | ICD-10-CM | POA: Diagnosis not present

## 2018-06-18 DIAGNOSIS — K760 Fatty (change of) liver, not elsewhere classified: Secondary | ICD-10-CM | POA: Diagnosis present

## 2018-06-18 DIAGNOSIS — E785 Hyperlipidemia, unspecified: Secondary | ICD-10-CM | POA: Diagnosis present

## 2018-06-18 DIAGNOSIS — R7989 Other specified abnormal findings of blood chemistry: Secondary | ICD-10-CM | POA: Diagnosis not present

## 2018-06-18 DIAGNOSIS — I272 Pulmonary hypertension, unspecified: Secondary | ICD-10-CM | POA: Diagnosis present

## 2018-06-18 DIAGNOSIS — I130 Hypertensive heart and chronic kidney disease with heart failure and stage 1 through stage 4 chronic kidney disease, or unspecified chronic kidney disease: Secondary | ICD-10-CM | POA: Diagnosis present

## 2018-06-18 DIAGNOSIS — E0520 Thyrotoxicosis with toxic multinodular goiter without thyrotoxic crisis or storm: Secondary | ICD-10-CM | POA: Diagnosis present

## 2018-06-18 DIAGNOSIS — E8770 Fluid overload, unspecified: Secondary | ICD-10-CM | POA: Diagnosis present

## 2018-06-18 DIAGNOSIS — R140 Abdominal distension (gaseous): Secondary | ICD-10-CM | POA: Diagnosis not present

## 2018-06-18 DIAGNOSIS — N183 Chronic kidney disease, stage 3 (moderate): Secondary | ICD-10-CM | POA: Diagnosis present

## 2018-06-18 DIAGNOSIS — E079 Disorder of thyroid, unspecified: Secondary | ICD-10-CM | POA: Diagnosis not present

## 2018-06-18 DIAGNOSIS — R188 Other ascites: Secondary | ICD-10-CM | POA: Diagnosis present

## 2018-06-18 DIAGNOSIS — Z91013 Allergy to seafood: Secondary | ICD-10-CM | POA: Diagnosis not present

## 2018-06-18 DIAGNOSIS — E1122 Type 2 diabetes mellitus with diabetic chronic kidney disease: Secondary | ICD-10-CM | POA: Diagnosis present

## 2018-06-18 DIAGNOSIS — Z90710 Acquired absence of both cervix and uterus: Secondary | ICD-10-CM | POA: Diagnosis not present

## 2018-06-18 DIAGNOSIS — I5043 Acute on chronic combined systolic (congestive) and diastolic (congestive) heart failure: Secondary | ICD-10-CM | POA: Diagnosis present

## 2018-06-18 DIAGNOSIS — Z825 Family history of asthma and other chronic lower respiratory diseases: Secondary | ICD-10-CM | POA: Diagnosis not present

## 2018-06-18 DIAGNOSIS — Z888 Allergy status to other drugs, medicaments and biological substances status: Secondary | ICD-10-CM | POA: Diagnosis not present

## 2018-06-18 DIAGNOSIS — Z950 Presence of cardiac pacemaker: Secondary | ICD-10-CM | POA: Diagnosis not present

## 2018-06-18 DIAGNOSIS — Z9049 Acquired absence of other specified parts of digestive tract: Secondary | ICD-10-CM | POA: Diagnosis not present

## 2018-06-18 DIAGNOSIS — I4891 Unspecified atrial fibrillation: Secondary | ICD-10-CM | POA: Diagnosis not present

## 2018-06-18 DIAGNOSIS — I442 Atrioventricular block, complete: Secondary | ICD-10-CM | POA: Diagnosis present

## 2018-06-18 DIAGNOSIS — Z8249 Family history of ischemic heart disease and other diseases of the circulatory system: Secondary | ICD-10-CM | POA: Diagnosis not present

## 2018-06-18 DIAGNOSIS — Z96651 Presence of right artificial knee joint: Secondary | ICD-10-CM | POA: Diagnosis present

## 2018-06-18 DIAGNOSIS — E876 Hypokalemia: Secondary | ICD-10-CM | POA: Diagnosis present

## 2018-06-18 DIAGNOSIS — I081 Rheumatic disorders of both mitral and tricuspid valves: Secondary | ICD-10-CM | POA: Diagnosis present

## 2018-06-18 DIAGNOSIS — M7122 Synovial cyst of popliteal space [Baker], left knee: Secondary | ICD-10-CM | POA: Diagnosis present

## 2018-06-18 LAB — CBC
HCT: 34.5 % — ABNORMAL LOW (ref 36.0–46.0)
Hemoglobin: 11.2 g/dL — ABNORMAL LOW (ref 12.0–15.0)
MCH: 28.4 pg (ref 26.0–34.0)
MCHC: 32.5 g/dL (ref 30.0–36.0)
MCV: 87.3 fL (ref 80.0–100.0)
Platelets: 273 10*3/uL (ref 150–400)
RBC: 3.95 MIL/uL (ref 3.87–5.11)
RDW: 16.7 % — ABNORMAL HIGH (ref 11.5–15.5)
WBC: 6.2 10*3/uL (ref 4.0–10.5)
nRBC: 0 % (ref 0.0–0.2)

## 2018-06-18 LAB — BRAIN NATRIURETIC PEPTIDE: B Natriuretic Peptide: 317.2 pg/mL — ABNORMAL HIGH (ref 0.0–100.0)

## 2018-06-18 LAB — BASIC METABOLIC PANEL
Anion gap: 10 (ref 5–15)
BUN: 20 mg/dL (ref 8–23)
CO2: 28 mmol/L (ref 22–32)
Calcium: 8.4 mg/dL — ABNORMAL LOW (ref 8.9–10.3)
Chloride: 101 mmol/L (ref 98–111)
Creatinine, Ser: 1.01 mg/dL — ABNORMAL HIGH (ref 0.44–1.00)
GFR calc Af Amer: 56 mL/min — ABNORMAL LOW (ref >60)
GFR calc non Af Amer: 48 mL/min — ABNORMAL LOW (ref >60)
Glucose, Bld: 104 mg/dL — ABNORMAL HIGH (ref 70–99)
Potassium: 4.2 mmol/L (ref 3.5–5.1)
Sodium: 139 mmol/L (ref 135–145)

## 2018-06-18 LAB — TROPONIN I
Troponin I: 0.03 ng/mL (ref <0.03)
Troponin I: 0.03 ng/mL (ref <0.03)

## 2018-06-18 LAB — MAGNESIUM: Magnesium: 1.4 mg/dL — ABNORMAL LOW (ref 1.7–2.4)

## 2018-06-18 MED ORDER — MAGNESIUM SULFATE 2 GM/50ML IV SOLN
2.0000 g | Freq: Once | INTRAVENOUS | Status: AC
  Administered 2018-06-18: 2 g via INTRAVENOUS
  Filled 2018-06-18: qty 50

## 2018-06-18 NOTE — Evaluation (Signed)
Physical Therapy Evaluation Patient Details Name: Denise [name] MRN: [id number] DOB: 09-17-1923 Today's Date: 06/18/2018   History of Present Illness  85 yo female admitted to ED 4/3 for shortness of breath, progressive LE swelling. Pt with acute on chronic CHF. Pt with fall 1 week ago, no serious injury sustained. CXR negative for acute findings, CT abdomen reveals ascites and fatty liver, bilateral LE dopplar [place] negative for DVT. PMH includes afib, CHB s/p pacemaker, HTN, CKD 2-3, DM, R TKR.    Clinical Impression   Pt presents with generalized LE weakness, LLE pain secondary to fall and pt-reported arthritic changes, difficulty performing bed mobility/transfers/gait, dyspnea on exertion, and increased time and effort to perform mobility tasks. Pt to benefit from acute PT to address deficits. Pt ambulated short hallway distance with RW with min assist for steadying and close guard for safety. PT encouraged pt to take a standing rest break in hallway due to dyspnea. Pt limited this session by L knee pain, dyspnea, and fatigue. PT recommending ST-SNF placement to address mobility deficits, as pt's son works during the day. Pt states she may be able to get help during the day from neighbors, she is unsure. PT to reassess next visit. PT to progress mobility as tolerated, and will continue to follow acutely.   BP post-ambulation: 152/64 SpO2 during session: 98-99% HR: 70s-80s bpm     Follow Up Recommendations SNF;Supervision for mobility/OOB    Equipment Recommendations  None recommended by PT    Recommendations for Other Services       Precautions / Restrictions Precautions Precautions: Fall Restrictions Weight Bearing Restrictions: No      Mobility  Bed Mobility Overal bed mobility: Needs Assistance Bed Mobility: Supine to Sit     Supine to sit: Min assist;HOB elevated     General bed mobility comments: Min assist for LLE lifting and translation to EOB, trunk elevation.  Increased time and effort to perform bed mobility, use of bedrails to complete.   Transfers Overall transfer level: Needs assistance Equipment used: Rolling walker (2 wheeled) Transfers: Sit to/from Stand Sit to Stand: Min assist         General transfer comment: Min assist for power up, steadying upon standing. Verbal cuing for hand placement when rising, pushing up with at least one hand on bed for stability. Sit to stand x2, both from bed pre- and post-ambulation.   Ambulation/Gait Ambulation/Gait assistance: Min assist Gait Distance (Feet): 60 Feet Assistive device: Rolling walker (2 wheeled) Gait Pattern/deviations: Step-through pattern;Decreased stance time - left;Decreased stride length Gait velocity: decr    General Gait Details: Min assist for occasional steadying. PT encouraged pt to take standing rest break due to dyspnea 2/4, not pt-initiated. Pt limited in ambulation distance by fatigue and LLE "feels like it could give way at any moment". Pt sats pre- and immediately post-ambulation 98-99% on RA, HR WFL.    Stairs            Wheelchair Mobility    Modified Rankin (Stroke Patients Only)       Balance Overall balance assessment: Needs assistance Sitting-balance support: No upper extremity supported;Feet supported Sitting balance-Leahy Scale: Good Sitting balance - Comments: able to sit EOB without PT or UE support.    Standing balance support: Bilateral upper extremity supported;During functional activity Standing balance-Leahy Scale: Poor Standing balance comment: reliant on RW for dynamic standing.  Pertinent Vitals/Pain Pain Assessment: Faces Faces Pain Scale: Hurts little more Pain Location: L knee  Pain Descriptors / Indicators: Sore Pain Intervention(s): Limited activity within patient's tolerance;Monitored during session;Repositioned    Home Living Family/patient expects to be discharged to:: Private  residence Living Arrangements: Children(son lives with her ) Available Help at Discharge: Family;Available PRN/intermittently(Son works - Human resources officer. Pt states son is available to assist her in the evenings if needed.) Type of Home: House Home Access: Stairs to enter;Ramped entrance   Entrance Stairs-Number of Steps: 1 Home Layout: Able to live on main level with bedroom/bathroom Home Equipment: Tub bench;Walker - 2 wheels      Prior Function Level of Independence: Needs assistance   Gait / Transfers Assistance Needed: Pt reports being independent with RW for ambulation up until fall last week, and pt reports mostly walks indoors for exercise. Pt reports she intermittently needs help with bed mobility, from son.  ADL's / Homemaking Assistance Needed: Pt reports someone comes into house to assist her with cleaning. Pt reports she is independent in dressing, bathing, toileting.         Hand Dominance   Dominant Hand: Right    Extremity/Trunk Assessment   Upper Extremity Assessment Upper Extremity Assessment: Overall WFL for tasks assessed    Lower Extremity Assessment Lower Extremity Assessment: Generalized weakness;LLE deficits/detail LLE Deficits / Details: unable to perform heel slide due to L knee pain from fall. Bruising noted on pt's lower leg, medial LUE.  LLE Sensation: WNL    Cervical / Trunk Assessment Cervical / Trunk Assessment: Normal  Communication   Communication: HOH  Cognition Arousal/Alertness: Awake/alert Behavior During Therapy: WFL for tasks assessed/performed Overall Cognitive Status: Within Functional Limits for tasks assessed                                        General Comments General comments (skin integrity, edema, etc.): Pt with pitting edema bilateral ankles/feet. Pt with bruising along medial LUE and L lower leg, due to fall 1 week ago.     Exercises     Assessment/Plan    PT Assessment Patient needs continued  PT services  PT Problem List Decreased strength;Decreased mobility;Decreased safety awareness;Decreased activity tolerance;Decreased balance;Pain       PT Treatment Interventions DME instruction;Functional mobility training;Balance training;Patient/family education;Gait training;Therapeutic activities;Therapeutic exercise    PT Goals (Current goals can be found in the Care Plan section)  Acute Rehab PT Goals Patient Stated Goal: get stronger, walk more  PT Goal Formulation: With patient Time For Goal Achievement: 07/02/18 Potential to Achieve Goals: Good    Frequency Min 3X/week   Barriers to discharge        Co-evaluation               AM-PAC PT "6 Clicks" Mobility  Outcome Measure Help needed turning from your back to your side while in a flat bed without using bedrails?: A Little Help needed moving from lying on your back to sitting on the side of a flat bed without using bedrails?: A Lot Help needed moving to and from a bed to a chair (including a wheelchair)?: A Little Help needed standing up from a chair using your arms (e.g., wheelchair or bedside chair)?: A Little Help needed to walk in hospital room?: A Little Help needed climbing 3-5 steps with a railing? : A Lot 6 Click Score: 16  End of Session Equipment Utilized During Treatment: Gait belt Activity Tolerance: Patient limited by pain;Patient limited by fatigue Patient left: in chair;with call bell/phone within reach;with chair alarm set Nurse Communication: Mobility status PT Visit Diagnosis: Other abnormalities of gait and mobility (R26.89);Muscle weakness (generalized) (M62.81);Difficulty in walking, not elsewhere classified (R26.2)    Time: 2197-5883 PT Time Calculation (min) (ACUTE ONLY): 28 min   Charges:   PT Evaluation $PT Eval Low Complexity: 1 Low PT Treatments $Gait Training: 8-22 mins        Nicola Police, PT Acute Rehabilitation Services Pager [phone]  Office  [phone]   Andy Allende D Kannon Baum 06/18/2018, 11:21 AM

## 2018-06-18 NOTE — Evaluation (Signed)
Occupational Therapy Evaluation Patient Details Name: Denise [name] MRN: [id number] DOB: Aug 12, 1923 Today's Date: 06/18/2018    History of Present Illness 85 yo female admitted to ED 4/3 for shortness of breath, progressive LE swelling. Pt with acute on chronic CHF. Pt with fall 1 week ago, no serious injury sustained. CXR negative for acute findings, CT abdomen reveals ascites and fatty liver, bilateral LE dopplar [place] negative for DVT. PMH includes afib, CHB s/p pacemaker, HTN, CKD 2-3, DM, R TKR.     Clinical Impression   Pt admitted with above. She demonstrates the below listed deficits and will benefit from continued OT to maximize safety and independence with BADLs.  Pt presents to OT with generalized weakness, decreased activity tolerance, impaired balance.  She currently requires min - max A for ADLs and min A for functional transfers.  She fatigues rapidly with activity with DOE 3/4 with simple ADL tasks.   She reports her son lives with her, but he works during [employer] day.   Discussed recommendation with SNF with her and she is uncertain.  She reports her son may be able to take off work to assist her as needed.  Will follow.        Follow Up Recommendations  SNF    Equipment Recommendations  None recommended by OT    Recommendations for Other Services       Precautions / Restrictions Precautions Precautions: Fall Restrictions Weight Bearing Restrictions: No      Mobility Bed Mobility Overal bed mobility: Needs Assistance Bed Mobility: Supine to Sit;Sit to Supine     Supine to sit: Min guard Sit to supine: Min assist   General bed mobility comments: assist for Lt LE   Transfers Overall transfer level: Needs assistance Equipment used: Rolling walker (2 wheeled) Transfers: Sit to/from [employer] Sit to Stand: Min assist Stand pivot transfers: Min assist       General transfer comment: assist to move into standing and assist to steady      Balance Overall balance assessment: Needs assistance Sitting-balance support: No upper extremity supported;Feet supported Sitting balance-Leahy Scale: Good Sitting balance - Comments: able to sit EOB without PT or UE support.    Standing balance support: Bilateral upper extremity supported;During functional activity Standing balance-Leahy Scale: Poor Standing balance comment: reliant on RW for dynamic standing.                            ADL either performed or assessed with clinical judgement   ADL Overall ADL's : Needs assistance/impaired Eating/Feeding: Independent   Grooming: Wash/dry hands;Wash/dry face;Oral care;Brushing hair;Set up;Sitting   Upper Body Bathing: Minimal assistance;Sitting   Lower Body Bathing: Maximal assistance;Sit to/from stand   Upper Body Dressing : Minimal assistance;Sitting   Lower Body Dressing: Maximal assistance;Sit to/from stand Lower Body Dressing Details (indicate cue type and reason): unable to access feet due ascitis and increased SOB when attempting to lean forward  Toilet Transfer: Minimal assistance;Stand-pivot;BSC;RW Toilet Transfer Details (indicate cue type and reason): assis to stand  Toileting- Clothing Manipulation and Hygiene: Moderate assistance Toileting - Clothing Manipulation Details (indicate cue type and reason): assist pulling pants up      Functional mobility during ADLs: Minimal assistance;Rolling walker General ADL Comments: pt fatigues rapidly with ADLs      Vision Patient Visual Report: No change from baseline       Perception     Praxis  Pertinent Vitals/Pain Pain Assessment: Faces Faces Pain Scale: Hurts little more Pain Location: L knee  Pain Descriptors / Indicators: Sore Pain Intervention(s): Monitored during session;Limited activity within patient's tolerance     Hand Dominance Right   Extremity/Trunk Assessment Upper Extremity Assessment Upper Extremity Assessment: Generalized  weakness;RUE deficits/detail RUE Deficits / Details: Pt with limited AROM Rt shoulder - AAROM WFL.  She reports old injury to Rt shoulder    Lower Extremity Assessment Lower Extremity Assessment: Defer to PT evaluation LLE Deficits / Details: unable to perform heel slide due to L knee pain from fall. Bruising noted on pt's lower leg, medial LUE.  LLE Sensation: WNL   Cervical / Trunk Assessment Cervical / Trunk Assessment: Other exceptions(abdominal distention due to ascitis )   Communication Communication Communication: HOH   Cognition Arousal/Alertness: Awake/alert Behavior During Therapy: WFL for tasks assessed/performed Overall Cognitive Status: Within Functional Limits for tasks assessed                                     General Comments  DOE 3/4 with minimal activity     Exercises     Shoulder Instructions      Home Living Family/patient expects to be discharged to:: Private residence Living Arrangements: Children Available Help at Discharge: Family;Available PRN/intermittently Type of Home: House Home Access: Stairs to enter;Ramped entrance Entrance Stairs-Number of Steps: 1   Home Layout: Able to live on main level with bedroom/bathroom     Bathroom Shower/Tub: Chief Strategy Officer: Handicapped height Bathroom Accessibility: Yes   Home Equipment: Tub bench;Walker - 2 wheels   Additional Comments: Pt lives with son, who may have the option of providing 24 hour assist.       Prior Functioning/Environment Level of Independence: Needs assistance  Gait / Transfers Assistance Needed: Pt reports being independent with RW for ambulation up until fall last week, and pt reports mostly walks indoors for exercise. Pt reports she intermittently needs help with bed mobility, from son. ADL's / Homemaking Assistance Needed: Pt reports someone comes into house to assist her with cleaning. Pt reports she is independent in dressing, bathing,  toileting.    Comments: Pt sponge bathes         OT Problem List: Decreased strength;Decreased activity tolerance;Impaired balance (sitting and/or standing);Decreased safety awareness;Decreased knowledge of use of DME or AE;Increased edema;Cardiopulmonary status limiting activity      OT Treatment/Interventions: Self-care/ADL training;DME and/or AE instruction;Therapeutic activities;Patient/family education;Balance training;Therapeutic exercise;Energy conservation    OT Goals(Current goals can be found in the care plan section) Acute Rehab OT Goals Patient Stated Goal: I need to keep doing, or I won't be able to do anything  OT Goal Formulation: With patient Time For Goal Achievement: 07/02/18 Potential to Achieve Goals: Good  OT Frequency: Min 2X/week   Barriers to D/C: Decreased caregiver support  unsure if son able to provide necessary level of assist        Co-evaluation              AM-PAC OT "6 Clicks" Daily Activity     Outcome Measure Help from another person eating meals?: None Help from another person taking care of personal grooming?: A Little Help from another person toileting, which includes using toliet, bedpan, or urinal?: A Lot Help from another person bathing (including washing, rinsing, drying)?: A Lot Help from another person to put on and taking off  regular upper body clothing?: A Little Help from another person to put on and taking off regular lower body clothing?: A Lot 6 Click Score: 16   End of Session Equipment Utilized During Treatment: Rolling walker;Gait belt Nurse Communication: Mobility status  Activity Tolerance: Patient limited by fatigue Patient left: in bed;with call bell/phone within reach;with bed alarm set  OT Visit Diagnosis: Unsteadiness on feet (R26.81);Muscle weakness (generalized) (M62.81)                Time: 7672-0947 OT Time Calculation (min): 26 min Charges:  OT General Charges $OT Visit: 1 Visit OT Evaluation $OT  Eval Moderate Complexity: 1 Mod OT Treatments $Self Care/Home Management : 8-22 mins  Jeani Hawking, OTR/L Acute Rehabilitation Services Pager [phone] Office [phone]   Jeani Hawking M 06/18/2018, 2:41 PM

## 2018-06-18 NOTE — Progress Notes (Signed)
Progress Note    Denise [name]  NID:[id number] DOB: 1924/01/07  DOA: 06/17/2018 PCP: Daryl Eastern, MD    Brief Narrative:     Medical records reviewed and are as summarized below  Denise [name] is a 85 y.o. female with medical history significant for permanent atrial fibrillation on Eliquis s/p AV node ablation 08/2004 now PPM dependent, chronic combined systolic and diastolic CHF (EF 14-43%), CHB, HTN, CKD Stage 2-3, and toxic multinodular goiter who was transferred from [facility] emergency department due to concern for acute CHF exacerbation, and elevated troponin  Assessment/Plan:   Principal Problem:   Acute on chronic combined systolic and diastolic CHF (congestive heart failure) (HCC) Active Problems:   Atrial fibrillation (HCC)   HTN (hypertension)   CHB (complete heart block) (HCC)   Thyroid disease   Acute on chronic combined systolic (congestive) and diastolic (congestive) heart failure (HCC)   Elevated troponin   Abdominal distention  Acute on chronic combined systolic and diastolic CHF: Patient with increased lower extremity and abdominal swelling and evidence for volume overload -BNP elevated to >300 -  Echocardiogram on 04/28/2017 showed an EF of 50-55%. -Start IV Lasix 40 mg twice daily -Strict I/O's, daily weights, monitor renal function and electrolytes -Continue home Toprol-XL, ramipril  Abdominal ascities Suspect underlying ascites from CHF.  No evidence of intra-abdominal infection/SBP.  Moderate ascites seen on CT scan at outside hospital with changes consistent with prior  -IV Lasix as above -Consider paracentesis pending  diuretic response  Elevated troponin: Mildly elevated troponin of 0.06 at outside hospital.  She denies any chest pain.  No known history of coronary artery disease on review cardiology records. -Trend troponin -Monitor on telemetry  Elevated d-dimer: D-dimer 3280 at outside hospital.  Lower  extremity Dopplers were negative for DVT bilaterally.  She is currently anticoagulated on Eliquis 5 mg twice daily.   -currently hemodynamically stable, saturating well on room air, with a paced rhythm and heart rate in the 70s on telemetry.  Have low suspicion for PE, regardless would not pursue VQ scan at this time as it would not change current management.  Permanent atrial fibrillation s/p AV node ablation, PPM dependent: V paced rhythm on telemetry with controlled rate. -Continue Toprol-XL -Continue Eliquis  Hypokalemia: -replete along with low Magnesium  Hypertension: BP stable. -Continue home  HCTZ, Toprol-XL, ramipril, Imdur  Hyperlipidemia: -Continue atorvastatin  Toxic multinodular goiter: -Continue methimazole -check TSH  Recent fall: Patient normally ambulates with a walker, fell about 1 week ago without injury.  Likely exacerbated from increased lower extremity edema and possible left knee pain from popliteal cyst. -PT/OT eval    Family Communication/Anticipated D/C date and plan/Code Status   DVT prophylaxis: Lovenox ordered. Code Status: dnr Family Communication:  Disposition Plan: pending diuresis   Medical Consultants:    None.   Anti-Infectives:    None  Subjective:   Legs feel less swollen  Objective:    Vitals:   06/17/18 2235 06/18/18 0227 06/18/18 0539 06/18/18 0726  BP:  (!) 144/67 129/62 116/69  Pulse:  70 71 70  Resp:  18 18 14   Temp:  98.3 F (36.8 C) 98.4 F (36.9 C) 98.1 F (36.7 C)  TempSrc:  Oral Oral Oral  SpO2:  96% 93% 95%  Weight: 67.6 kg 67.9 kg    Height: 5\' 2"  (1.575 m)       Intake/Output Summary (Last 24 hours) at 06/18/2018 1002 Last data filed at 06/18/2018 0750  Gross per 24 hour  Intake 510 ml  Output 950 ml  Net -440 ml   Filed Weights   06/17/18 2235 06/18/18 0227  Weight: 67.6 kg 67.9 kg    Exam: In bed, NAD Appears comfortable Non-pitting LE edema +Bs, soft, NT abdomen Diminished lung  sounds at bases with few crackles  Data Reviewed:   I have personally reviewed following labs and imaging studies:  Labs: Labs show the following:   Basic Metabolic Panel: Recent Labs  Lab 06/18/18 0316  NA 139  K 4.2  CL 101  CO2 28  GLUCOSE 104*  BUN 20  CREATININE 1.01*  CALCIUM 8.4*  MG 1.4*   GFR Estimated Creatinine Clearance: 32.1 mL/min (A) (by C-G formula based on SCr of 1.01 mg/dL (H)). Liver Function Tests: No results for input(s): AST, ALT, ALKPHOS, BILITOT, PROT, ALBUMIN in the last 168 hours. No results for input(s): LIPASE, AMYLASE in the last 168 hours. No results for input(s): AMMONIA in the last 168 hours. Coagulation profile No results for input(s): INR, PROTIME in the last 168 hours.  CBC: Recent Labs  Lab 06/18/18 0316  WBC 6.2  HGB 11.2*  HCT 34.5*  MCV 87.3  PLT 273   Cardiac Enzymes: Recent Labs  Lab 06/17/18 2133 06/18/18 0316  TROPONINI 0.04* 0.03*   BNP (last 3 results) No results for input(s): PROBNP in the last 8760 hours. CBG: No results for input(s): GLUCAP in the last 168 hours. D-Dimer: No results for input(s): DDIMER in the last 72 hours. Hgb A1c: No results for input(s): HGBA1C in the last 72 hours. Lipid Profile: No results for input(s): CHOL, HDL, LDLCALC, TRIG, CHOLHDL, LDLDIRECT in the last 72 hours. Thyroid function studies: No results for input(s): TSH, T4TOTAL, T3FREE, THYROIDAB in the last 72 hours.  Invalid input(s): FREET3 Anemia work up: No results for input(s): VITAMINB12, FOLATE, FERRITIN, TIBC, IRON, RETICCTPCT in the last 72 hours. Sepsis Labs: Recent Labs  Lab 06/18/18 0316  WBC 6.2    Microbiology No results found for this or any previous visit (from the past 240 hour(s)).  Procedures and diagnostic studies:  X-ray Chest Pa And Lateral  Result Date: 06/18/2018 CLINICAL DATA:  CHF.  Ascites. EXAM: CHEST - 2 VIEW COMPARISON:  06/07/2012 FINDINGS: Cardiac silhouette is mildly enlarged. Left  anterior chest wall sequential pacemaker is stable. No mediastinal or hilar masses. No evidence of adenopathy. Lungs are clear. Minimal pleural effusions.  No pneumothorax. Skeletal structures are demineralized but grossly intact. IMPRESSION: 1. No acute cardiopulmonary disease. No evidence of congestive heart failure. 2. Mild stable cardiomegaly.  Minimal pleural effusions. Electronically Signed   By: Amie Portland M.D.   On: 06/18/2018 06:42   [place] Ascites (abdomen Limited)  Result Date: 06/17/2018 CLINICAL DATA:  CHF.  Ascites. EXAM: LIMITED ABDOMEN ULTRASOUND FOR ASCITES TECHNIQUE: Limited ultrasound survey for ascites was performed in all four abdominal quadrants. COMPARISON:  None. FINDINGS: Moderate ascites in all 4 quadrants. There is more site is on the left than the right, probably due to positioning. IMPRESSION: Moderate ascites. Electronically Signed   By: Gerome Sam III M.D   On: 06/17/2018 22:33    Medications:   . amLODipine  5 mg Oral q morning - 10a  . apixaban  5 mg Oral BID  . atorvastatin  10 mg Oral QPM  . furosemide  40 mg Intravenous BID  . hydrochlorothiazide  12.5 mg Oral q morning - 10a  . isosorbide mononitrate  15 mg Oral q  morning - 10a  . methimazole  5 mg Oral Daily  . metoprolol succinate  50 mg Oral Daily  . ramipril  5 mg Oral Daily  . senna-docusate  1 tablet Oral BID  . sodium chloride flush  3 mL Intravenous Q12H   Continuous Infusions: . magnesium sulfate 1 - 4 g bolus IVPB       LOS: 1 day   Joseph Art  [facility]   How to contact the [facility] Attending or Consulting provider 7A - 7P or covering provider during after hours 7P -7A, for this patient?  1. Check the care team in [facility] and look for a) attending/consulting TRH provider listed and b) the [facility] team listed 2. Log into [email or web address] and use [place]'s universal password to access. If you do not have the password, please contact the hospital operator. 3. Locate the [facility] provider  you are looking for under [facility] and page to a number that you can be directly reached. 4. If you still have difficulty reaching the provider, please page the [facility] (Director on Call) for the Hospitalists listed on amion for assistance.  06/18/2018, 10:02 AM

## 2018-06-19 DIAGNOSIS — R188 Other ascites: Secondary | ICD-10-CM

## 2018-06-19 DIAGNOSIS — E079 Disorder of thyroid, unspecified: Secondary | ICD-10-CM

## 2018-06-19 DIAGNOSIS — R7989 Other specified abnormal findings of blood chemistry: Secondary | ICD-10-CM

## 2018-06-19 LAB — TSH: TSH: 3.62 u[IU]/mL (ref 0.350–4.500)

## 2018-06-19 LAB — CBC
HCT: 36.5 % (ref 36.0–46.0)
Hemoglobin: 11.2 g/dL — ABNORMAL LOW (ref 12.0–15.0)
MCH: 26.9 pg (ref 26.0–34.0)
MCHC: 30.7 g/dL (ref 30.0–36.0)
MCV: 87.5 fL (ref 80.0–100.0)
Platelets: 272 10*3/uL (ref 150–400)
RBC: 4.17 MIL/uL (ref 3.87–5.11)
RDW: 16.6 % — ABNORMAL HIGH (ref 11.5–15.5)
WBC: 6.2 10*3/uL (ref 4.0–10.5)
nRBC: 0 % (ref 0.0–0.2)

## 2018-06-19 LAB — BASIC METABOLIC PANEL
Anion gap: 14 (ref 5–15)
BUN: 22 mg/dL (ref 8–23)
CO2: 27 mmol/L (ref 22–32)
Calcium: 8.4 mg/dL — ABNORMAL LOW (ref 8.9–10.3)
Chloride: 94 mmol/L — ABNORMAL LOW (ref 98–111)
Creatinine, Ser: 1.18 mg/dL — ABNORMAL HIGH (ref 0.44–1.00)
Glucose, Bld: 89 mg/dL (ref 70–99)
Potassium: 3.2 mmol/L — ABNORMAL LOW (ref 3.5–5.1)
Sodium: 135 mmol/L (ref 135–145)

## 2018-06-19 MED ORDER — POTASSIUM CHLORIDE CRYS ER 20 MEQ PO TBCR
40.0000 meq | EXTENDED_RELEASE_TABLET | Freq: Once | ORAL | Status: AC
  Administered 2018-06-19: 40 meq via ORAL
  Filled 2018-06-19: qty 2

## 2018-06-19 NOTE — Progress Notes (Signed)
Progress Note    Denise [name]  NIO:[id number] DOB: 07/20/1923  DOA: 06/17/2018 PCP: Daryl Eastern, MD    Brief Narrative:     Medical records reviewed and are as summarized below  Denise [name] is a 85 y.o. female with medical history significant for permanent atrial fibrillation on Eliquis s/p AV node ablation 08/2004 now PPM dependent, chronic combined systolic and diastolic CHF (EF 81-82%), CHB, HTN, CKD Stage 2-3, and toxic multinodular goiter who was transferred from [facility] emergency department due to concern for acute CHF exacerbation, and elevated troponin  Assessment/Plan:   Principal Problem:   Acute on chronic combined systolic and diastolic CHF (congestive heart failure) (HCC) Active Problems:   Atrial fibrillation (HCC)   HTN (hypertension)   CHB (complete heart block) (HCC)   Thyroid disease   Acute on chronic combined systolic (congestive) and diastolic (congestive) heart failure (HCC)   Elevated troponin   Abdominal distention   Volume overload  Acute on chronic combined systolic and diastolic CHF: Patient with increased lower extremity and abdominal swelling and evidence for volume overload -BNP elevated to >300 -  Echocardiogram on 04/28/2017 showed an EF of 50-55%. -Start IV Lasix 40 mg twice daily -Strict I/O's, daily weights, monitor renal function and electrolytes -Continue home Toprol-XL, ramipril -weight down 13lbs -patient eats frozen dinners at home (? Salt load)  Abdominal ascities Suspect underlying ascites from CHF.  No evidence of intra-abdominal infection/SBP.  Moderate ascites seen on CT scan at outside hospital with changes consistent with prior  -IV Lasix as above -Considering paracentesis pending  diuretic response  Elevated troponin: Mildly elevated troponin of 0.06 at outside hospital.  She denies any chest pain.  -suspect demand from volume overload  Elevated d-dimer: D-dimer 3280 at outside  hospital.  Lower extremity Dopplers were negative for DVT bilaterally.  She is currently anticoagulated on Eliquis 5 mg twice daily.   -currently hemodynamically stable, saturating well on room air, with a paced rhythm and heart rate in the 70s on telemetry.  Have low suspicion for PE, regardless would not pursue VQ scan at this time as it would not change current management.  Permanent atrial fibrillation s/p AV node ablation, PPM dependent: V paced rhythm on telemetry with controlled rate. -Continue Toprol-XL -Continue Eliquis  Hypokalemia: -replete along with low Magnesium  Hypertension: BP stable. -Continue home Toprol-XL, ramipril, Imdur  Hyperlipidemia: -Continue atorvastatin  Toxic multinodular goiter: -Continue methimazole -TSH in normal range  Recent fall: Patient normally ambulates with a walker, fell about 1 week ago without injury.  Likely exacerbated from increased lower extremity edema and possible left knee pain from popliteal cyst. -PT/OT eval- SNF    Family Communication/Anticipated D/C date and plan/Code Status   DVT prophylaxis: Lovenox ordered. Code Status: dnr Family Communication: spoke with daughter on the phone Disposition Plan: pending diuresis and need for SNF placement-- will need PT eval again in the AM   Medical Consultants:    None.     Subjective:   No complaints this AM  Objective:    Vitals:   06/18/18 0726 06/18/18 1058 06/18/18 1935 06/19/18 0417  BP: 116/69 (!) 139/59 110/61 109/63  Pulse: 70 72 69 70  Resp: 14  18 16   Temp: 98.1 F (36.7 C)  98.3 F (36.8 C) 98.5 F (36.9 C)  TempSrc: Oral  Oral Oral  SpO2: 95%  94% 97%  Weight:    66.8 kg  Height:  Intake/Output Summary (Last 24 hours) at 06/19/2018 0905 Last data filed at 06/19/2018 0700 Gross per 24 hour  Intake 840 ml  Output 1600 ml  Net -760 ml   Filed Weights   06/17/18 2235 06/18/18 0227 06/19/18 0417  Weight: 67.6 kg 67.9 kg 66.8 kg     Exam: In bed, on phone Alert, pleasant and cooperative rrr Diminished but no increased work of breathing +BS, obese abdomen Non-pitting LE edema  Data Reviewed:   I have personally reviewed following labs and imaging studies:  Labs: Labs show the following:   Basic Metabolic Panel: Recent Labs  Lab 06/18/18 0316 06/19/18 0608  NA 139 135  K 4.2 3.2*  CL 101 94*  CO2 28 27  GLUCOSE 104* 89  BUN 20 22  CREATININE 1.01* 1.18*  CALCIUM 8.4* 8.4*  MG 1.4*  --    GFR Estimated Creatinine Clearance: 27.3 mL/min (A) (by C-G formula based on SCr of 1.18 mg/dL (H)). Liver Function Tests: No results for input(s): AST, ALT, ALKPHOS, BILITOT, PROT, ALBUMIN in the last 168 hours. No results for input(s): LIPASE, AMYLASE in the last 168 hours. No results for input(s): AMMONIA in the last 168 hours. Coagulation profile No results for input(s): INR, PROTIME in the last 168 hours.  CBC: Recent Labs  Lab 06/18/18 0316 06/19/18 0608  WBC 6.2 6.2  HGB 11.2* 11.2*  HCT 34.5* 36.5  MCV 87.3 87.5  PLT 273 272   Cardiac Enzymes: Recent Labs  Lab 06/17/18 2133 06/18/18 0316 06/18/18 0859  TROPONINI 0.04* 0.03* 0.03*   BNP (last 3 results) No results for input(s): PROBNP in the last 8760 hours. CBG: No results for input(s): GLUCAP in the last 168 hours. D-Dimer: No results for input(s): DDIMER in the last 72 hours. Hgb A1c: No results for input(s): HGBA1C in the last 72 hours. Lipid Profile: No results for input(s): CHOL, HDL, LDLCALC, TRIG, CHOLHDL, LDLDIRECT in the last 72 hours. Thyroid function studies: Recent Labs    06/19/18 0608  TSH 3.620   Anemia work up: No results for input(s): VITAMINB12, FOLATE, FERRITIN, TIBC, IRON, RETICCTPCT in the last 72 hours. Sepsis Labs: Recent Labs  Lab 06/18/18 0316 06/19/18 0608  WBC 6.2 6.2    Microbiology No results found for this or any previous visit (from the past 240 hour(s)).  Procedures and diagnostic  studies:  X-ray Chest Pa And Lateral  Result Date: 06/18/2018 CLINICAL DATA:  CHF.  Ascites. EXAM: CHEST - 2 VIEW COMPARISON:  06/07/2012 FINDINGS: Cardiac silhouette is mildly enlarged. Left anterior chest wall sequential pacemaker is stable. No mediastinal or hilar masses. No evidence of adenopathy. Lungs are clear. Minimal pleural effusions.  No pneumothorax. Skeletal structures are demineralized but grossly intact. IMPRESSION: 1. No acute cardiopulmonary disease. No evidence of congestive heart failure. 2. Mild stable cardiomegaly.  Minimal pleural effusions. Electronically Signed   By: Amie Portland M.D.   On: 06/18/2018 06:42   [place] Ascites (abdomen Limited)  Result Date: 06/17/2018 CLINICAL DATA:  CHF.  Ascites. EXAM: LIMITED ABDOMEN ULTRASOUND FOR ASCITES TECHNIQUE: Limited ultrasound survey for ascites was performed in all four abdominal quadrants. COMPARISON:  None. FINDINGS: Moderate ascites in all 4 quadrants. There is more site is on the left than the right, probably due to positioning. IMPRESSION: Moderate ascites. Electronically Signed   By: Gerome Sam III M.D   On: 06/17/2018 22:33    Medications:   . apixaban  5 mg Oral BID  . atorvastatin  10 mg  Oral QPM  . furosemide  40 mg Intravenous BID  . hydrochlorothiazide  12.5 mg Oral q morning - 10a  . isosorbide mononitrate  15 mg Oral q morning - 10a  . methimazole  5 mg Oral Daily  . metoprolol succinate  50 mg Oral Daily  . potassium chloride  40 mEq Oral Once  . ramipril  5 mg Oral Daily  . senna-docusate  1 tablet Oral BID  . sodium chloride flush  3 mL Intravenous Q12H   Continuous Infusions:    LOS: 2 days   Joseph Art  [facility]   How to contact the [facility] Attending or Consulting provider 7A - 7P or covering provider during after hours 7P -7A, for this patient?  1. Check the care team in [facility] and look for a) attending/consulting TRH provider listed and b) the [facility] team listed 2. Log into [email or web address]  and use [place]'s universal password to access. If you do not have the password, please contact the hospital operator. 3. Locate the [facility] provider you are looking for under [facility] and page to a number that you can be directly reached. 4. If you still have difficulty reaching the provider, please page the [facility] (Director on Call) for the Hospitalists listed on amion for assistance.  06/19/2018, 9:05 AM

## 2018-06-19 NOTE — Discharge Instructions (Addendum)

## 2018-06-19 NOTE — Progress Notes (Signed)
Patient resting comfortably during shift report. Denies complaints.  

## 2018-06-20 LAB — CBC
HCT: 34.8 % — ABNORMAL LOW (ref 36.0–46.0)
Hemoglobin: 11.2 g/dL — ABNORMAL LOW (ref 12.0–15.0)
MCH: 28 pg (ref 26.0–34.0)
MCHC: 32.2 g/dL (ref 30.0–36.0)
MCV: 87 fL (ref 80.0–100.0)
Platelets: 245 10*3/uL (ref 150–400)
RBC: 4 MIL/uL (ref 3.87–5.11)
RDW: 16.5 % — ABNORMAL HIGH (ref 11.5–15.5)
WBC: 8 10*3/uL (ref 4.0–10.5)
nRBC: 0 % (ref 0.0–0.2)

## 2018-06-20 LAB — BASIC METABOLIC PANEL
Anion gap: 12 (ref 5–15)
BUN: 25 mg/dL — ABNORMAL HIGH (ref 8–23)
CO2: 30 mmol/L (ref 22–32)
Calcium: 8.3 mg/dL — ABNORMAL LOW (ref 8.9–10.3)
Chloride: 94 mmol/L — ABNORMAL LOW (ref 98–111)
Creatinine, Ser: 1.28 mg/dL — ABNORMAL HIGH (ref 0.44–1.00)
GFR calc Af Amer: 42 mL/min — ABNORMAL LOW (ref >60)
GFR calc non Af Amer: 36 mL/min — ABNORMAL LOW (ref >60)
Glucose, Bld: 108 mg/dL — ABNORMAL HIGH (ref 70–99)
Potassium: 2.8 mmol/L — ABNORMAL LOW (ref 3.5–5.1)
Sodium: 136 mmol/L (ref 135–145)

## 2018-06-20 LAB — MAGNESIUM: Magnesium: 1.6 mg/dL — ABNORMAL LOW (ref 1.7–2.4)

## 2018-06-20 LAB — POTASSIUM: Potassium: 3.4 mmol/L — ABNORMAL LOW (ref 3.5–5.1)

## 2018-06-20 MED ORDER — MAGNESIUM SULFATE 2 GM/50ML IV SOLN
2.0000 g | Freq: Once | INTRAVENOUS | Status: AC
  Administered 2018-06-20: 2 g via INTRAVENOUS
  Filled 2018-06-20: qty 50

## 2018-06-20 MED ORDER — FUROSEMIDE 40 MG PO TABS
40.0000 mg | ORAL_TABLET | Freq: Every day | ORAL | Status: DC
  Administered 2018-06-21: 40 mg via ORAL
  Filled 2018-06-20: qty 1

## 2018-06-20 MED ORDER — POTASSIUM CHLORIDE CRYS ER 20 MEQ PO TBCR
40.0000 meq | EXTENDED_RELEASE_TABLET | ORAL | Status: AC
  Administered 2018-06-20 (×3): 40 meq via ORAL
  Filled 2018-06-20 (×3): qty 2

## 2018-06-20 MED ORDER — DICLOFENAC SODIUM 1 % TD GEL
2.0000 g | Freq: Four times a day (QID) | TRANSDERMAL | Status: DC | PRN
  Filled 2018-06-20: qty 100

## 2018-06-20 NOTE — Plan of Care (Signed)
  Problem: Activity: Goal: Capacity to carry out activities will improve Outcome: Progressing   Problem: Activity: Goal: Risk for activity intolerance will decrease Outcome: Progressing   Problem: Safety: Goal: Ability to remain free from injury will improve Outcome: Progressing   

## 2018-06-20 NOTE — TOC Initial Note (Addendum)
Transition of Care [facility]) - Initial/Assessment Note    Patient Details  Name: Denise [name] MRN: [id number] Date of Birth: November 15, 1923  Transition of Care [facility]) CM/SW Contact:    Candie Chroman, LCSW Phone Number: 06/20/2018, 8:58 AM  Clinical Narrative:  CSW met with patient, introduced role, and explained that PT recommendations would be discussed. Patient is not interested in SNF placement but is agreeable to home health. She was not set up with any home health agencies prior to admission. Patient has a walker and bedside commode at home already. No extra equipment needs. Patient would prefer her son, Jeneen Rinks, make home health agency decision. CSW tried calling. Voicemail is full. Will try again later if no call back. No further concerns. CSW encouraged patient to contact CSW as needed. CSW will continue to follow patient for support and facilitate discharge home once medically stable.  11:47 am: Called and spoke to patient's son. He is a Psychologist, sport and exercise but stated he has taken a leave from work. He believes he has found someone he can hire to stay with her when he cannot. Reviewed CMS Medicare scores for home health agencies that serve their zip code. [facility] is first preference. CSW spoke with their RN and faxed referral. [facility] and [facility] are preferences if [facility] cannot accept her. Son also interested in palliative care as an extra support. Per [facility] [employer], residents in that area use either [facility] or [facility]. Patient's son stated he realizes that she has lived a long, good life and he wants her to be able to pass at home. He stated that she recently told him she was "ready."   1:39 pm: [facility] is able to accept patient for home health PT, OT, RN, and aide. Faxed home health orders. They are aware she will discharge home tomorrow. Made referral to [facility] for [facility] palliative care. She will review referral  and notify CSW of decision.   Expected Discharge Plan: [place] Barriers to Discharge: Continued Medical Work up   Patient Goals and CMS Choice        Expected Discharge Plan and Services Expected Discharge Plan: [place] Choice: [place] arrangements for the past 2 months: Single Family Home                 DME Arranged: N/A DME Agency: NA      Prior Living Arrangements/Services Living arrangements for the past 2 months: Single Family Home Lives with:: Adult Children Patient language and need for interpreter reviewed:: No Do you feel safe going back to the place where you live?: Yes      Need for Family Participation in Patient Care: Yes (Comment) Care giver support system in place?: No (comment)(Lives with son but he works during the day. She said he might be able to take some time off.) Current home services: DME(Walker and bedside commode) Criminal Activity/Legal Involvement Pertinent to Current Situation/Hospitalization: No - Comment as needed  Activities of Daily Living Home Assistive Devices/Equipment: Gilford Rile (specify type) ADL Screening (condition at time of admission) Patient's cognitive ability adequate to safely complete daily activities?: Yes Is the patient deaf or have difficulty hearing?: Yes Does the patient have difficulty seeing, even when wearing glasses/contacts?: No Does the patient have difficulty concentrating, remembering, or making decisions?: No Patient able to express need for  assistance with ADLs?: Yes Does the patient have difficulty dressing or bathing?: No Independently performs ADLs?: No Communication: Independent Dressing (OT): Needs assistance Grooming: Needs assistance Feeding: Independent Bathing: Needs assistance Toileting: Needs assistance In/Out Bed: Needs assistance Walks in Home: Independent with device (comment) Does the patient have difficulty walking or  climbing stairs?: Yes Weakness of Legs: Both Weakness of Arms/Hands: None  Permission Sought/Granted Permission sought to share information with : Family Supports Permission granted to share information with : Yes, Verbal Permission Granted  Share Information with NAME: Raidyn Breiner     Permission granted to share info w Relationship: Son  Permission granted to share info w Contact Information: [phone]  Emotional Assessment Appearance:: Appears stated age Attitude/Demeanor/Rapport: Engaged Affect (typically observed): Appropriate, Calm, Pleasant Orientation: : Oriented to Self, Oriented to Place, Oriented to  Time, Oriented to Situation Alcohol / Substance Use: Never Used Psych Involvement: No (comment)  Admission diagnosis:  NSTEMI CHF Patient Active Problem List   Diagnosis Date Noted  . Volume overload 06/18/2018  . Thyroid disease 06/17/2018  . Acute on chronic combined systolic (congestive) and diastolic (congestive) heart failure ([place]) 06/17/2018  . Elevated troponin 06/17/2018  . Abdominal distention 06/17/2018  . Pacemaker battery depletion 08/15/2017  . Chronic anticoagulation - coumadin, CHADS2VASC=5 06/07/2015  . Chronic combined systolic and diastolic CHF, NYHA class 2 ([place]) 06/07/2015  . Heme positive stool 06/07/2015  . GIB (gastrointestinal bleeding) 06/07/2015  . Hyperlipidemia 04/11/2015  . Acute on chronic combined systolic and diastolic CHF (congestive heart failure) ([place]) 01/08/2015  . Long term current use of anticoagulant therapy 02/28/2013  . Nonischemic cardiomyopathy ([place]) 10/20/2012  . CHB (complete heart block) ([place]) 10/20/2012  . Pacemaker 09/06/2012  . Fracture of right hip ([place]) 06/05/2012  . Fracture of right humerus 06/05/2012  . Atrial fibrillation ([place]) 06/05/2012  . DM (diabetes mellitus) ([place]) 06/05/2012  . HTN (hypertension) 06/05/2012  . Hypertension 06/05/2012  . Warfarin-induced coagulopathy ([place]) 06/05/2012   PCP:  Burnard Bunting, MD Pharmacy:   [place], [place] - [address] [place] [address] Phone: [phone] Fax: [place], [place] - [address] [place] [address] Phone: [phone] Fax: [phone]     Social Determinants of Health (SDOH) Interventions    Readmission Risk Interventions No flowsheet data found.

## 2018-06-20 NOTE — Progress Notes (Signed)
Physical Therapy Treatment Patient Details Name: Denise [name] MRN: [id number] DOB: 1923-06-06 Today's Date: 06/20/2018    History of Present Illness 85 yo female admitted to ED 4/3 for shortness of breath, progressive LE swelling. Pt with acute on chronic CHF. Pt with fall 1 week ago, no serious injury sustained. CXR negative for acute findings, CT abdomen reveals ascites and fatty liver, bilateral LE dopplar [place] negative for DVT. PMH includes afib, CHB s/p pacemaker, HTN, CKD 2-3, DM, R TKR.      PT Comments    Pt generally weak and deconditioned.  She was able to walk a short distance across the room with RW and minimal assistance.  She report L knee pain from a recent fall that is still bothering her. She is refusing SNF placement for rehab and prefers to d/c home.   PT will continue to follow acutely for safe mobility progression  Follow Up Recommendations  Home health PT;Supervision for mobility/OOB(pt refusing SNF, max HH services (PT/OT/aide))     Equipment Recommendations  None recommended by PT    Recommendations for Other Services   NA     Precautions / Restrictions Precautions Precautions: Fall Precaution Comments: reports recent fall with L knee pain    Mobility  Bed Mobility               General bed mobility comments: Pt is in recliner chair.   Transfers Overall transfer level: Needs assistance Equipment used: Rolling walker (2 wheeled) Transfers: Sit to/from Stand Sit to Stand: Supervision         General transfer comment: supervision for safety, pt told therapist to back up and let her do it.    Ambulation/Gait Ambulation/Gait assistance: Min guard Gait Distance (Feet): 15 Feet(x2) Assistive device: Rolling walker (2 wheeled) Gait Pattern/deviations: Step-through pattern;Antalgic;Shuffle     General Gait Details: mildly antalgic gait pattern favoring her left leg.  Pt reports not walking much here in the hospital and can tell she has gotten  weak.           Balance Overall balance assessment: Needs assistance Sitting-balance support: Feet supported;No upper extremity supported Sitting balance-Leahy Scale: Good     Standing balance support: No upper extremity supported;Bilateral upper extremity supported Standing balance-Leahy Scale: Poor Standing balance comment: needs upper extremity support in standing.                             Cognition Arousal/Alertness: Awake/alert Behavior During Therapy: WFL for tasks assessed/performed Overall Cognitive Status: No family/caregiver present to determine baseline cognitive functioning                                 General Comments: Pt's conversation is normal, but no family present to report and not specifically tested.              Pertinent Vitals/Pain Pain Assessment: Faces Faces Pain Scale: Hurts little more Pain Location: L knee  Pain Descriptors / Indicators: Sore Pain Intervention(s): Limited activity within patient's tolerance;Monitored during session;Repositioned       Prior Function            PT Goals (current goals can now be found in the care plan section) Acute Rehab PT Goals Patient Stated Goal: I need to keep doing, or I won't be able to do anything  Progress towards PT goals: Progressing toward goals  Frequency    Min 3X/week      PT Plan Current plan remains appropriate       AM-PAC PT "6 Clicks" Mobility   Outcome Measure  Help needed turning from your back to your side while in a flat bed without using bedrails?: A Little Help needed moving from lying on your back to sitting on the side of a flat bed without using bedrails?: A Little Help needed moving to and from a bed to a chair (including a wheelchair)?: A Little Help needed standing up from a chair using your arms (e.g., wheelchair or bedside chair)?: A Little Help needed to walk in hospital room?: A Little Help needed climbing 3-5 steps with  a railing? : A Little 6 Click Score: 18    End of Session Equipment Utilized During Treatment: Gait belt Activity Tolerance: Patient limited by fatigue Patient left: in chair;with call bell/phone within reach;with chair alarm set   PT Visit Diagnosis: Other abnormalities of gait and mobility (R26.89);Muscle weakness (generalized) (M62.81);Difficulty in walking, not elsewhere classified (R26.2)     Time: 9476-5465 PT Time Calculation (min) (ACUTE ONLY): 29 min  Charges:  $Gait Training: 8-22 mins $Therapeutic Activity: 8-22 mins             Shamiracle Gorden B. Richele Strand, PT, DPT  Acute Rehabilitation [phone] pager #[phone] office            06/20/2018, 3:37 PM

## 2018-06-20 NOTE — Progress Notes (Signed)
Progress Note    Denise [name]  ENI:[id number] DOB: 06-24-1923  DOA: 06/17/2018 PCP: Daryl Eastern, MD    Brief Narrative:     Medical records reviewed and are as summarized below  Denise [name] is a 85 y.o. female with medical history significant for permanent atrial fibrillation on Eliquis s/p AV node ablation 08/2004 now PPM dependent, chronic combined systolic and diastolic CHF (EF 61-44%), CHB, HTN, CKD Stage 2-3, and toxic multinodular goiter who was transferred from [facility] emergency department due to concern for acute CHF exacerbation, and elevated troponin  Assessment/Plan:   Principal Problem:   Acute on chronic combined systolic and diastolic CHF (congestive heart failure) (HCC) Active Problems:   Atrial fibrillation (HCC)   HTN (hypertension)   CHB (complete heart block) (HCC)   Thyroid disease   Acute on chronic combined systolic (congestive) and diastolic (congestive) heart failure (HCC)   Elevated troponin   Abdominal distention   Volume overload  Acute on chronic combined systolic and diastolic CHF: Patient with increased lower extremity and abdominal swelling and evidence for volume overload -BNP elevated to >300 -  Echocardiogram on 04/28/2017 showed an EF of 50-55%. -Started IV Lasix 40 mg twice daily with good diuresis-- resume PO lasix in AM -Strict I/O's, daily weights, monitor renal function and electrolytes -Continue home Toprol-XL, ramipril -weight down about 15 lbs -patient eats frozen dinners at home (? Salt load)  Abdominal ascities Suspect underlying ascites from CHF.  No evidence of intra-abdominal infection/SBP.  Moderate ascites seen on CT scan at outside hospital with changes consistent with prior   Elevated troponin: Mildly elevated troponin of 0.06 at outside hospital.  She denies any chest pain.  -suspect demand from volume overload  Elevated d-dimer: D-dimer 3280 at outside hospital.  Lower extremity  Dopplers were negative for DVT bilaterally.  She is currently anticoagulated on Eliquis 5 mg twice daily.   -currently hemodynamically stable, saturating well on room air, with a paced rhythm and heart rate in the 70s on telemetry.  Have low suspicion for PE, regardless would not pursue VQ scan at this time as it would not change current management.  Permanent atrial fibrillation s/p AV node ablation, PPM dependent: V paced rhythm on telemetry with controlled rate. -Continue Toprol-XL -Continue Eliquis  Hypokalemia: -replete along with low Magnesium  Hypertension: BP stable. -Continue home Toprol-XL, ramipril, Imdur  Hyperlipidemia: -Continue atorvastatin  Toxic multinodular goiter: -Continue methimazole -TSH in normal range  Recent fall: Patient normally ambulates with a walker, fell about 1 week ago without injury.  Likely exacerbated from increased lower extremity edema and possible left knee pain from popliteal cyst. -PT/OT eval done-- home health ordered -voltaren gel PRN    Family Communication/Anticipated D/C date and plan/Code Status   DVT prophylaxis: Lovenox ordered. Code Status: dnr Family Communication: called son to discuss discharge planning-- home health and he will be with her 24/7 Disposition Plan: K replacement and d./c in AM-- son needs 1 1/2 hour notice as he lives out of town  Medical Consultants:    None.     Subjective:   Wants to go home  Objective:    Vitals:   06/19/18 1942 06/20/18 0130 06/20/18 0357 06/20/18 0934  BP: 125/77  117/72 125/60  Pulse: 70  76 70  Resp: 18  18 18   Temp: 98.3 F (36.8 C)  98 F (36.7 C) 98.2 F (36.8 C)  TempSrc: Oral  Oral Oral  SpO2: 94%  98% 98%  Weight:  65.4 kg    Height:        Intake/Output Summary (Last 24 hours) at 06/20/2018 1019 Last data filed at 06/20/2018 0825 Gross per 24 hour  Intake 720 ml  Output 1750 ml  Net -1030 ml   Filed Weights   06/18/18 0227 06/19/18 0417  06/20/18 0130  Weight: 67.9 kg 66.8 kg 65.4 kg    Exam: In chair, NAD rrr no wheezing Less LE swelling abdominal swelling lessened Pleasant   Data Reviewed:   I have personally reviewed following labs and imaging studies:  Labs: Labs show the following:   Basic Metabolic Panel: Recent Labs  Lab 06/18/18 0316 06/19/18 0608 06/20/18 0416  NA 139 135 136  K 4.2 3.2* 2.8*  CL 101 94* 94*  CO2 28 27 30   GLUCOSE 104* 89 108*  BUN 20 22 25*  CREATININE 1.01* 1.18* 1.28*  CALCIUM 8.4* 8.4* 8.3*  MG 1.4*  --  1.6*   GFR Estimated Creatinine Clearance: 24.9 mL/min (A) (by C-G formula based on SCr of 1.28 mg/dL (H)). Liver Function Tests: No results for input(s): AST, ALT, ALKPHOS, BILITOT, PROT, ALBUMIN in the last 168 hours. No results for input(s): LIPASE, AMYLASE in the last 168 hours. No results for input(s): AMMONIA in the last 168 hours. Coagulation profile No results for input(s): INR, PROTIME in the last 168 hours.  CBC: Recent Labs  Lab 06/18/18 0316 06/19/18 0608 06/20/18 0416  WBC 6.2 6.2 8.0  HGB 11.2* 11.2* 11.2*  HCT 34.5* 36.5 34.8*  MCV 87.3 87.5 87.0  PLT 273 272 245   Cardiac Enzymes: Recent Labs  Lab 06/17/18 2133 06/18/18 0316 06/18/18 0859  TROPONINI 0.04* 0.03* 0.03*   BNP (last 3 results) No results for input(s): PROBNP in the last 8760 hours. CBG: No results for input(s): GLUCAP in the last 168 hours. D-Dimer: No results for input(s): DDIMER in the last 72 hours. Hgb A1c: No results for input(s): HGBA1C in the last 72 hours. Lipid Profile: No results for input(s): CHOL, HDL, LDLCALC, TRIG, CHOLHDL, LDLDIRECT in the last 72 hours. Thyroid function studies: Recent Labs    06/19/18 0608  TSH 3.620   Anemia work up: No results for input(s): VITAMINB12, FOLATE, FERRITIN, TIBC, IRON, RETICCTPCT in the last 72 hours. Sepsis Labs: Recent Labs  Lab 06/18/18 0316 06/19/18 0608 06/20/18 0416  WBC 6.2 6.2 8.0    Microbiology  No results found for this or any previous visit (from the past 240 hour(s)).  Procedures and diagnostic studies:  No results found.  Medications:   . apixaban  5 mg Oral BID  . atorvastatin  10 mg Oral QPM  . [START ON 06/21/2018] furosemide  40 mg Oral Daily  . isosorbide mononitrate  15 mg Oral q morning - 10a  . methimazole  5 mg Oral Daily  . metoprolol succinate  50 mg Oral Daily  . potassium chloride  40 mEq Oral Q4H  . ramipril  5 mg Oral Daily  . senna-docusate  1 tablet Oral BID  . sodium chloride flush  3 mL Intravenous Q12H   Continuous Infusions: . magnesium sulfate 1 - 4 g bolus IVPB 2 g (06/20/18 0949)     LOS: 2 days   Joseph Art  [facility]   How to contact the [facility] Attending or Consulting provider 7A - 7P or covering provider during after hours 7P -7A, for this patient?  1. Check the care team in [facility]  and look for a) attending/consulting TR[facility] provider listed and b) the [facility] team listed 2. Log into [email or web address] and use [place]'s universal password to access. If you do not have the password, please contact the hospital operator. 3. Locate the [facility] provider you are looking for under [facility] and page to a number that you can be directly reached. 4. If you still have difficulty reaching the provider, please page the [facility] (Director on Call) for the Hospitalists listed on amion for assistance.  06/20/2018, 10:19 AM

## 2018-06-20 NOTE — Care Management Important Message (Signed)
Important Message  Patient Details  Name: Denise [name] MRN: [id number] Date of Birth: 12-18-1923   Medicare Important Message Given:  Yes    Patra Gherardi Stefan Church 06/20/2018, 3:57 PM

## 2018-06-20 NOTE — Progress Notes (Signed)
Occupational Therapy Treatment Patient Details Name: Denise [name] MRN: [id number] DOB: 1923-08-01 Today's Date: 06/20/2018    History of present illness 85 yo female admitted to ED 4/3 for shortness of breath, progressive LE swelling. Pt with acute on chronic CHF. Pt with fall 1 week ago, no serious injury sustained. CXR negative for acute findings, CT abdomen reveals ascites and fatty liver, bilateral LE dopplar [place] negative for DVT. PMH includes afib, CHB s/p pacemaker, HTN, CKD 2-3, DM, R TKR.     OT comments  Pt eager to go home. Reports she lives with her son who works, but has family nearby. Pt with decreased awareness of her deficits. Pt requiring moderate assist for bed mobility and min assist with RW for short distance ambulation. Sat to perform grooming due to reliance on UE support in standing and decreased activity tolerance. If pt chooses to go home, recommending close, 24 hour assistance and HHOT.   Follow Up Recommendations  SNF    Equipment Recommendations  None recommended by OT    Recommendations for Other Services      Precautions / Restrictions Precautions Precautions: Fall Restrictions Weight Bearing Restrictions: No       Mobility Bed Mobility Overal bed mobility: Needs Assistance Bed Mobility: Supine to Sit     Supine to sit: Mod assist     General bed mobility comments: assist to raise trunk and position hips at EOB with bed pad  Transfers Overall transfer level: Needs assistance Equipment used: Rolling walker (2 wheeled) Transfers: Sit to/from Stand Sit to Stand: Min assist         General transfer comment: steadying assist    Balance Overall balance assessment: Needs assistance   Sitting balance-Leahy Scale: Fair       Standing balance-Leahy Scale: Poor Standing balance comment: reliant on at least one hand support on walker                           ADL either performed or assessed with clinical judgement   ADL  Overall ADL's : Needs assistance/impaired     Grooming: Wash/dry hands;Wash/dry face;Oral care;Set up;Sitting           Upper Body Dressing : Minimal assistance;Sitting   Lower Body Dressing: Maximal assistance;Sit to/from stand   Toilet Transfer: Minimal assistance;Stand-pivot;BSC;RW   Toileting- Clothing Manipulation and Hygiene: Moderate assistance               Vision       Perception     Praxis      Cognition Arousal/Alertness: Awake/alert Behavior During Therapy: WFL for tasks assessed/performed Overall Cognitive Status: Impaired/Different from baseline Area of Impairment: Memory. Decreased awareness of deficits.                     Memory: Decreased short-term memory         General Comments: unsure of pt's baseline        Exercises     Shoulder Instructions       General Comments      Pertinent Vitals/ Pain       Pain Assessment: No/denies pain  Home Living                                          Prior Functioning/Environment  Frequency  Min 2X/week        Progress Toward Goals  OT Goals(current goals can now be found in the care plan section)  Progress towards OT goals: Progressing toward goals  Acute Rehab OT Goals Patient Stated Goal: I need to keep doing, or I won't be able to do anything  OT Goal Formulation: With patient Time For Goal Achievement: 07/02/18 Potential to Achieve Goals: Good  Plan Discharge plan remains appropriate    Co-evaluation                 AM-PAC OT "6 Clicks" Daily Activity     Outcome Measure   Help from another person eating meals?: None Help from another person taking care of personal grooming?: A Little Help from another person toileting, which includes using toliet, bedpan, or urinal?: A Lot Help from another person bathing (including washing, rinsing, drying)?: A Lot Help from another person to put on and taking off regular upper body  clothing?: A Little Help from another person to put on and taking off regular lower body clothing?: A Lot 6 Click Score: 16    End of Session Equipment Utilized During Treatment: Rolling walker;Gait belt  OT Visit Diagnosis: Unsteadiness on feet (R26.81);Muscle weakness (generalized) (M62.81)   Activity Tolerance Patient tolerated treatment well   Patient Left in chair;with call bell/phone within reach;with chair alarm set;with nursing/sitter in room   Nurse Communication Mobility status        Time: 0910-0930 OT Time Calculation (min): 20 min  Charges: OT General Charges $OT Visit: 1 Visit OT Treatments $Self Care/Home Management : 8-22 mins  Martie Round, OTR/L Acute Rehabilitation Services Pager: [phone] Office: [phone]   Evern Bio 06/20/2018, 10:00 AM

## 2018-06-21 DIAGNOSIS — R140 Abdominal distension (gaseous): Secondary | ICD-10-CM

## 2018-06-21 DIAGNOSIS — N1831 Chronic kidney disease, stage 3a: Secondary | ICD-10-CM | POA: Diagnosis present

## 2018-06-21 DIAGNOSIS — I10 Essential (primary) hypertension: Secondary | ICD-10-CM

## 2018-06-21 DIAGNOSIS — I4891 Unspecified atrial fibrillation: Secondary | ICD-10-CM

## 2018-06-21 DIAGNOSIS — N183 Chronic kidney disease, stage 3 (moderate): Secondary | ICD-10-CM

## 2018-06-21 DIAGNOSIS — I442 Atrioventricular block, complete: Secondary | ICD-10-CM

## 2018-06-21 LAB — BASIC METABOLIC PANEL
Anion gap: 9 (ref 5–15)
BUN: 26 mg/dL — ABNORMAL HIGH (ref 8–23)
CO2: 25 mmol/L (ref 22–32)
Calcium: 8.5 mg/dL — ABNORMAL LOW (ref 8.9–10.3)
Chloride: 99 mmol/L (ref 98–111)
Creatinine, Ser: 1.04 mg/dL — ABNORMAL HIGH (ref 0.44–1.00)
GFR calc Af Amer: 54 mL/min — ABNORMAL LOW (ref >60)
GFR calc non Af Amer: 47 mL/min — ABNORMAL LOW (ref >60)
Glucose, Bld: 98 mg/dL (ref 70–99)
Potassium: 4.7 mmol/L (ref 3.5–5.1)
Sodium: 133 mmol/L — ABNORMAL LOW (ref 135–145)

## 2018-06-21 MED ORDER — ISOSORBIDE MONONITRATE ER 30 MG PO TB24: 15.0000 mg | ORAL_TABLET | Freq: Every day | ORAL | Status: DC

## 2018-06-21 MED ORDER — FUROSEMIDE 40 MG PO TABS: 40.0000 mg | ORAL_TABLET | Freq: Every day | ORAL | 0 refills | Status: DC

## 2018-06-21 NOTE — TOC Transition Note (Addendum)
Transition of Care [facility]) - CM/SW Discharge Note   Patient Details  Name: Denise [name] MRN: [id number] Date of Birth: 04-26-1923  Transition of Care [facility]) CM/SW Contact:  Margarito Liner, LCSW Phone Number: 06/21/2018, 12:26 PM   Clinical Narrative:  Confirmed home health services have been arranged through [facility] (PT, OT, RN, and aide). [facility] is reaching out to patient/son today to discuss services. Patient's son will be here between 1:15-1:30 to pick her up.   CSW signing off.  Final next level of care: Home w Home Health Services Barriers to Discharge: Barriers Resolved   Patient Goals and CMS Choice  Goal: "To get back where I can wait on myself." CMS Medicare.gov Compare Post Acute Care list provided to:: Patient Represenative (must comment)(Gave son list verbally over the phone.) Choice offered to / list presented to : Adult Children  Discharge Placement                Patient to be transferred to facility by: Son will transport by car. Name of family member notified: Allizon Olthoff Patient and family notified of of transfer: 06/21/18  Discharge Plan and Services     Post Acute Care Choice: Home Health          DME Arranged: N/A DME Agency: NA HH Arranged: RN, PT, OT, Nurse's Aide(Outpatient palliative services.) HH Agency: Other - See comment([facility], [facility].)   Social Determinants of Health (SDOH) Interventions     Readmission Risk Interventions No flowsheet data found.

## 2018-06-21 NOTE — Discharge Summary (Addendum)
Denise [name], is a 85 y.o. female  DOB Mar 15, 1924  MRN [id number].  Admission date:  06/17/2018  Admitting Physician  Charlsie Quest, MD  Discharge Date:  06/21/2018   Primary MD  Daryl Eastern, MD  Recommendations for primary care physician for things to follow:   Kidney function with change of furosemide from 3  times weekly to daily.  Discharge Diagnosis  Principal Problem:   Acute on chronic combined systolic and diastolic CHF (congestive heart failure) (HCC) Active Problems:   Atrial fibrillation (HCC)   HTN (hypertension)   CHB (complete heart block) (HCC)   Thyroid disease   Acute on chronic combined systolic (congestive) and diastolic (congestive) heart failure (HCC)   Elevated troponin   Abdominal distention   Volume overload   CKD (chronic kidney disease), stage III (HCC)      Past Medical History:  Diagnosis Date  . CHF (congestive heart failure) (HCC)   . Diabetes mellitus without complication (HCC)   . Hypertension   . LVH (left ventricular hypertrophy)   . Mitral regurgitation    mod to severe  . Permanent atrial fibrillation   . Presence of permanent cardiac pacemaker   . Pulmonary hypertension (HCC)    severe  . Thyroid disease   . Tricuspid regurgitation    severe    Past Surgical History:  Procedure Laterality Date  . ABDOMINAL HYSTERECTOMY    . CARDIOVERSION  5 /18/ 2000   unsuccessful  . CHOLECYSTECTOMY    . COMPRESSION HIP SCREW Right 06/06/2012   Procedure: Open Reduction Internal Fixation Right HIp;  Surgeon: Dannielle Huh, MD;  Location: [facility];  Service: Orthopedics;  Laterality: Right;  . ESOPHAGOGASTRODUODENOSCOPY N/A 06/09/2015   Procedure: ESOPHAGOGASTRODUODENOSCOPY (EGD);  Surgeon: Charlott Rakes, MD;  [facility];  Service: Endoscopy;  Laterality: N/A;  .  knee replacement     R  . NM MYOCAR PERF WALL MOTION  02/28/2009   No signficant ischemia  . PACEMAKER INSERTION  05/26/2004   [facility]  . PPM GENERATOR CHANGEOUT N/A 08/17/2017   Procedure: PPM GENERATOR CHANGEOUT;  Surgeon: Thurmon Fair, MD;  Location: [facility] INVASIVE CV LAB;  Service: Cardiovascular;  Laterality: N/A;  . THYROIDECTOMY    . TUBAL LIGATION    . US ECHOCARDIOGRAPHY  02/25/2011   mod to severe MR,LA severe dilated,mild annular ca+,mod. TR,mod PI,mod ca+ of the aortic valve leaflet      Summary of history of present illness:  Denise [name] a 85 y.o.femalewith medical history significant forpermanent atrial fibrillation on Eliquiss/pAV nodeablation 08/2004 now PPM dependent,chronic combined systolic and diastolic CHF (EF 15-83%), CHB,HTN, CKD Stage 3, andtoxic multinodular goiterwho was transferred from [facility] department due to concern for acute CHF exacerbation, and elevated troponin   Hospital Course:   1.  Acute on chronic combined systolic and diastolic CHF: Patient presented on admission with increased lower extremity and abdominal swelling and evidence for volume overload. -BNP elevated to >300 -Echocardiogram on 04/28/2017 showed an EF of  50-55%. -Started IV Lasix 40 mg twice daily with good diuresis-- resumed PO lasix 40 mg daily on 4/7.  Patient previously taking Lasix 40 mg M/W/F -Strict I/O's, daily weights, monitor renal function and electrolytes -Continue home Toprol-XL and ramipril -Weight down about 15 lbs.  Discharge weight 142.5 pounds  -Patient eats frozen dinners at home from [employer].  Suspect that this could be contributing to symptoms.   2.  Abdominal ascities Suspect underlying ascites from CHF. No evidence of intra-abdominal infection/SBP. Moderate ascites seen on CT scan at outside hospital with changes consistent with prior.  3.  Elevated troponin: Mildly elevated troponin of 0.06 at outside  hospital. She denies any chest pain.  -suspect demand from volume overload  4.  Elevated d-dimer: D-dimer 3280 at outside hospital. Lower extremity Dopplers were negative for DVT bilaterally. She is currently anticoagulated on Eliquis 5 mg twice daily.  -currently hemodynamically stable, saturating well on room air, with a paced rhythm and heart rate in the 70s on telemetry. Have low suspicion for PE, regardless would not pursue VQ scan at this time as it would not change current management.  5.  Permanent atrial fibrillations/p AV node ablation, PPM dependent: V paced rhythm on telemetry with controlled rate. -Continue Toprol-XL -Continue Eliquis  6.  Hypokalemia/hypomagnesemia: -Repleted  7.  Hypertension: BP stable. -Continue home Toprol-XL, ramipril, Imdur, hydrochlorothiazide   8.  Hyperlipidemia:  -Continue atorvastatin  9.  Toxic multinodular goiter: -Continue methimazole -TSH in normal range  10.  Recent fall: Patient normally ambulates with a walker, fell about 1 week ago without injury. Likely exacerbated from increased lower extremity edema and possible left knee pain from popliteal cyst. -PT/OT eval done-- home health ordered -voltaren gel PRN  11.  Chronic kidney disease stage III: Creatinine 1.04 with GFR less than 30-40s   Follow UP  Follow-up Information    Daryl EasternEller, Chrystal Faye, MD. Go on 06/23/2018.   Specialty:  Family Medicine Why:  @2 :00pm Contact information: [address] [place] [place] [address] [phone]            Consults obtained: None  Discharge Condition: Stable  Diet and Activity recommendation: See Discharge Instructions below  Discharge Instructions    (HEART FAILURE PATIENTS) Call MD:  Anytime you have any of the following symptoms: 1) 3 pound weight gain in 24 hours or 5 pounds in 1 week 2) shortness of breath, with or without a dry hacking cough 3) swelling in the hands, feet or stomach 4) if you have to  sleep on extra pillows at night in order to breathe.   Complete by:  As directed    Ambulatory referral to Cardiology   Complete by:  As directed    Follow-up given recent hospitalization for chest of heart failure exacerbation   Diet - low sodium heart healthy   Complete by:  As directed    Discharge instructions   Complete by:  As directed    Follow with Primary MD Daryl EasternEller, Chrystal Faye, MD within 7-10 days.  If diagnosed with a congestive heart failure exacerbation.  Please be informed to check your weight daily.  It is recommended that you continue on your current home regimen of medications.  Take furosemide 40 mg daily if weight appears to be increasing and call cardiologist regarding further instructions/adjustments needed.  Get CBC and BMP-  checked  by Primary MD or SNF MD in 5-7 days ( we routinely change or add medications that can affect your  baseline labs and fluid status, therefore we recommend that you get the mentioned basic workup next visit with your PCP, your PCP may decide not to get them or add new tests based on their clinical decision)  Activity: As tolerated with fall precautions. Use walker/cane & assistance as needed  Referrals: PT/OT/RN/nursing aide ordered  Disposition: Home with son  Diet: Heart healthy with Fluid restriction: 1800 mL Fluid       For Heart failure patients - Check your Weight same time everyday, if you gain over 2 pounds, or you develop in leg swelling, experience more shortness of breath or chest pain, call your Primary doctor immediately. Follow Cardiac Low Salt Diet and 1.5 lit/day fluid restriction.  Special Instructions: If you have smoked or chewed Tobacco  in the last 2 yrs please stop smoking, stop any regular Alcohol  and or any Recreational drug use.  On your next visit with your primary care physician please Get Medicines reviewed and adjusted.  Please request your Daryl Eastern, MD to go over all Hospital Tests and  Procedure/Radiological results at the follow up, please get all Hospital records sent to your Prim MD by signing hospital release before you go home.  If you experience worsening of your admission symptoms, develop shortness of breath, life threatening emergency, suicidal or homicidal thoughts you must seek medical attention immediately by calling 911 or calling your MD immediately  if symptoms less severe.  You Must read complete instructions/literature along with all the possible adverse reactions/side effects for all the Medicines you take and that have been prescribed to you. Take any new Medicines after you have completely understood and accpet all the possible adverse reactions/side effects.   Do not drive, operate heavy machinery, perform activities at heights, swimming or participation in water activities or provide baby sitting services if your were admitted for syncope or siezures until you have seen by Primary MD or a Neurologist and advised to do so again.  Do not drive when taking Pain medications.  Do not take more than prescribed Pain, Sleep and Anxiety Medications  Wear Seat belts while driving.   Please note  You were cared for by a hospitalist during your hospital stay. If you have any questions about your discharge medications or the care you received while you were in the hospital after you are discharged, you can call the unit and asked to speak with the hospitalist on call if the hospitalist that took care of you is not available. Once you are discharged, your primary care physician will handle any further medical issues. Please note that NO REFILLS for any discharge medications will be authorized once you are discharged, as it is imperative that you return to your primary care physician (or establish a relationship with a primary care physician if you do not have one) for your aftercare needs so that they can reassess your need for medications and monitor your lab values.    Heart Failure patients record your daily weight using the same scale at the same time of day   Complete by:  As directed    Increase activity slowly   Complete by:  As directed         Discharge Medications     Allergies as of 06/21/2018      Reactions   Iohexol Rash, Other (See Comments)    Desc: VOMITING-VERIFIED ON 05-24-04 PRE-PROCEDURE/ ARS   Shrimp [shellfish Allergy] Nausea And Vomiting, Rash      Medication List  STOP taking these medications   albuterol 108 (90 Base) MCG/ACT inhaler Commonly known as:  PROVENTIL HFA;VENTOLIN HFA   warfarin 5 MG tablet Commonly known as:  COUMADIN     TAKE these medications   amLODipine 5 MG tablet Commonly known as:  NORVASC TAKE 1 TABLET (5 MG TOTAL) BY MOUTH EVERY MORNING. What changed:  when to take this   atorvastatin 10 MG tablet Commonly known as:  LIPITOR Take 10 mg by mouth every evening.   Eliquis 5 MG Tabs tablet Generic drug:  apixaban TAKE 1 TABLET BY MOUTH 2 TIMES A DAY What changed:  how much to take   furosemide 40 MG tablet Commonly known as:  LASIX Take 1 tablet (40 mg total) by mouth 3 (three) times a week. Mondays, Wednesdays, and Fridays What changed:    when to take this  additional instructions   hydrochlorothiazide 25 MG tablet Commonly known as:  HYDRODIURIL Take 0.5 tablets (12.5 mg total) by mouth every morning. What changed:  how much to take   HYDROcodone-acetaminophen 5-325 MG tablet Commonly known as:  NORCO/VICODIN Take 1 tablet by mouth every 6 (six) hours as needed for moderate pain.   isosorbide mononitrate 30 MG 24 hr tablet Commonly known as:  IMDUR Take 0.5 tablets (15 mg total) by mouth daily.   methimazole 10 MG tablet Commonly known as:  TAPAZOLE Take 5 mg by mouth daily.   metoprolol succinate 50 MG 24 hr tablet Commonly known as:  TOPROL-XL Take 1 tablet (50 mg total) by mouth daily. Take with or immediately following a meal.   pantoprazole 40 MG tablet  Commonly known as:  PROTONIX Take 40 mg by mouth daily.   potassium chloride SA 20 MEQ tablet Commonly known as:  K-DUR,KLOR-CON Take 1-2 tablets (20-40 mEq total) by mouth daily as directed.   ramipril 5 MG capsule Commonly known as:  ALTACE Take 1 capsule (5 mg total) by mouth daily.       Major procedures and Radiology Reports - PLEASE review detailed and final reports for all details, in brief -     X-ray Chest Pa And Lateral  Result Date: 06/18/2018 CLINICAL DATA:  CHF.  Ascites. EXAM: CHEST - 2 VIEW COMPARISON:  06/07/2012 FINDINGS: Cardiac silhouette is mildly enlarged. Left anterior chest wall sequential pacemaker is stable. No mediastinal or hilar masses. No evidence of adenopathy. Lungs are clear. Minimal pleural effusions.  No pneumothorax. Skeletal structures are demineralized but grossly intact. IMPRESSION: 1. No acute cardiopulmonary disease. No evidence of congestive heart failure. 2. Mild stable cardiomegaly.  Minimal pleural effusions. Electronically Signed   By: Amie Portland M.D.   On: 06/18/2018 06:42   [place] Ascites (abdomen Limited)  Result Date: 06/17/2018 CLINICAL DATA:  CHF.  Ascites. EXAM: LIMITED ABDOMEN ULTRASOUND FOR ASCITES TECHNIQUE: Limited ultrasound survey for ascites was performed in all four abdominal quadrants. COMPARISON:  None. FINDINGS: Moderate ascites in all 4 quadrants. There is more site is on the left than the right, probably due to positioning. IMPRESSION: Moderate ascites. Electronically Signed   By: Gerome Sam III M.D   On: 06/17/2018 22:33    Micro Results    No results found for this or any previous visit (from the past 240 hour(s)).     Today   Subjective    Denise [name] today states she has had no problems overnight.   Objective   Blood pressure 108/64, pulse 70, temperature 99 F (37.2 C), temperature source Oral, resp. rate  18, height 5\' 2"  (1.575 m), weight 64.6 kg, SpO2 95 %.   Intake/Output Summary (Last 24  hours) at 06/21/2018 1146 Last data filed at 06/21/2018 0500 Gross per 24 hour  Intake 540 ml  Output 200 ml  Net 340 ml    Exam  Constitutional: Elderly female in NAD, calm, comfortable Eyes: PERRL, lids and conjunctivae normal ENMT: Mucous membranes are moist. Posterior pharynx clear of any exudate or lesions.  Neck: normal, supple, no masses, no thyromegaly.  No JVD Respiratory: clear to auscultation bilaterally, no wheezing, no crackles. Normal respiratory effort. No accessory muscle use.  Cardiovascular: Regular rate and rhythm, no murmurs / rubs / gallops.  Trace lower extremity edema. 2+ pedal pulses. No carotid bruits.  Abdomen: no tenderness, no masses palpated. No hepatosplenomegaly. Bowel sounds positive.  Musculoskeletal: no clubbing / cyanosis. No joint deformity upper and lower extremities. Good ROM, no contractures. Normal muscle tone.  Skin: no rashes, lesions, ulcers. No induration Neurologic: CN 2-12 grossly intact. Sensation intact, DTR normal. Strength 5/5 in all 4.  Psychiatric: Normal judgment and insight. Alert and oriented x 3. Normal mood.    Data Review   CBC w Diff:  Lab Results  Component Value Date   WBC 8.0 06/20/2018   HGB 11.2 (L) 06/20/2018   HGB 12.9 05/11/2017   HCT 34.8 (L) 06/20/2018   HCT 38.8 05/11/2017   PLT 245 06/20/2018   PLT 217 05/11/2017   LYMPHOPCT 31 06/07/2015   MONOPCT 10 06/07/2015   EOSPCT 1 06/07/2015   BASOPCT 1 06/07/2015    CMP:  Lab Results  Component Value Date   NA 133 (L) 06/21/2018   NA 144 05/11/2017   K 4.7 06/21/2018   CL 99 06/21/2018   CO2 25 06/21/2018   BUN 26 (H) 06/21/2018   BUN 23 05/11/2017   CREATININE 1.04 (H) 06/21/2018   CREATININE 1.04 (H) 11/20/2015   PROT 7.4 05/11/2017   ALBUMIN 4.2 05/11/2017   BILITOT 1.3 (H) 05/11/2017   ALKPHOS 123 (H) 05/11/2017   AST 19 05/11/2017   ALT 20 05/11/2017     Total Time in preparing paper work, data evaluation and todays exam - 35 minutes   Clydie Braun M.D on 06/21/2018 at 11:46 AM  [facility]   Office  [phone]

## 2018-06-21 NOTE — Progress Notes (Signed)
Discharge instructions reviewed with patient and medications to be reviewed with son upon his arrival. IV is removed. Patient able to teach back discharge plan of care and appointment date. Patient to return via private vehicle with son.

## 2018-06-22 ENCOUNTER — Ambulatory Visit: Payer: Self-pay | Admitting: Pharmacist Clinician (PhC)/ Clinical Pharmacy Specialist

## 2018-06-22 DIAGNOSIS — Z7901 Long term (current) use of anticoagulants: Secondary | ICD-10-CM

## 2018-06-22 DIAGNOSIS — I4891 Unspecified atrial fibrillation: Secondary | ICD-10-CM

## 2018-08-03 ENCOUNTER — Encounter: Payer: Self-pay | Admitting: Physician Assistant

## 2018-08-03 ENCOUNTER — Telehealth (INDEPENDENT_AMBULATORY_CARE_PROVIDER_SITE_OTHER): Payer: Medicare Other | Admitting: Physician Assistant

## 2018-08-03 VITALS — BP 122/71 | Temp 97.9°F | Resp 18 | Ht 62.0 in | Wt 148.2 lb

## 2018-08-03 DIAGNOSIS — I340 Nonrheumatic mitral (valve) insufficiency: Secondary | ICD-10-CM

## 2018-08-03 DIAGNOSIS — I4821 Permanent atrial fibrillation: Secondary | ICD-10-CM

## 2018-08-03 DIAGNOSIS — Z950 Presence of cardiac pacemaker: Secondary | ICD-10-CM

## 2018-08-03 DIAGNOSIS — R188 Other ascites: Secondary | ICD-10-CM

## 2018-08-03 DIAGNOSIS — I5043 Acute on chronic combined systolic (congestive) and diastolic (congestive) heart failure: Secondary | ICD-10-CM

## 2018-08-03 DIAGNOSIS — E119 Type 2 diabetes mellitus without complications: Secondary | ICD-10-CM

## 2018-08-03 DIAGNOSIS — I10 Essential (primary) hypertension: Secondary | ICD-10-CM

## 2018-08-03 MED ORDER — FUROSEMIDE 40 MG PO TABS: 40.0000 mg | ORAL_TABLET | Freq: Every day | ORAL | 2 refills | Status: DC

## 2018-08-03 MED ORDER — APIXABAN 5 MG PO TABS: 5.0000 mg | ORAL_TABLET | Freq: Two times a day (BID) | ORAL | 2 refills | Status: DC

## 2018-08-03 NOTE — Patient Instructions (Addendum)
Medication Instructions:   You may take an Extra 40 mg Lasix and 2 tablets of Potassium for the next 3 days. After the 3 days go back to taking previously prescribed dosage of Lasix and Potassium   If you need a refill on your cardiac medications before your next appointment, please call your pharmacy.   Lab work:  Your Home Health Nurse will drawn your labs (blood work) drawn next Monday 08/10/18 or Tuesday 08/11/2018  If you have labs (blood work) drawn today and your tests are completely normal, you will receive your results only by: [name] MyChart Message (if you have MyChart) OR . A paper copy in the mail If you have any lab test that is abnormal or we need to change your treatment, we will call you to review the results.  Testing/Procedures:  NONE ordered at this time of appointment   Follow-Up: At [facility], you and your health needs are our priority.  As part of our continuing mission to provide you with exceptional heart care, we have created designated Provider Care Teams.  These Care Teams include your primary Cardiologist (physician) and Advanced Practice Providers (APPs -  Physician Assistants and Nurse Practitioners) who all work together to provide you with the care you need, when you need it. You will need a follow up appointment in 3-4 weeks.  Please call our office 2 months in advance to schedule this appointment.  You may see Nicki Guadalajara, MD or one of the following Advanced Practice Providers on your designated Care Team: Marissa, [place] . Micah Flesher, PA-C  Any Other Special Instructions Will Be Listed Below (If Applicable).

## 2018-08-03 NOTE — Progress Notes (Signed)
Virtual Visit via Telephone Note   This visit type was conducted due to national recommendations for restrictions regarding the COVID-19 Pandemic (e.g. social distancing) in an effort to limit this patient's exposure and mitigate transmission in our community.  Due to her co-morbid illnesses, this patient is at least at moderate risk for complications without adequate follow up.  This format is felt to be most appropriate for this patient at this time.  The patient did not have access to video technology/had technical difficulties with video requiring transitioning to audio format only (telephone).  All issues noted in this document were discussed and addressed.  No physical exam could be performed with this format.  Please refer to the patient's chart for her  consent to telehealth for [facility].   Date:  08/05/2018   ID:  Denise [name], DOB [birth year], MRN [id number]  Patient Location: Home Provider Location: Home  PCP:  Daryl Eastern, MD  Cardiologist:  Nicki Guadalajara, MD  Electrophysiologist:  None   Evaluation Performed:  Follow-Up Visit  Chief Complaint:  followup  History of Present Illness:    Denise [name] is a 85 y.o. female with past medical history of permanent atrial fibrillation s/p AV node ablation and [employer] Jude permanent pacemaker implantation in June 2006, chronic combined systolic and diastolic heart failure, toxic multinodular goiter on methimazole, hypertension, DM 2, moderate to severe MR and severe TR.  Previous echocardiogram in December 2012 showed EF 45 to 50%, moderate to severe MR, moderate pulmonary hypertension.  Last echocardiogram obtained on 04/28/2017 showed EF 50 to 55%, mild aortic stenosis, mild MR, severe LAE, moderate TR.  Her pacemaker is being followed by Dr. Royann Shivers.  She eventually had a generator change out in February 2019.  Last remote device transmission was obtained in March 2020 which showed no significant episode of high  ventricular rate, pacemaker was functioning normally.  More recently, patient was admitted to the hospital in April 2020 for acute CHF exacerbation.  She underwent IV diuresis and a weight drop of 15 pounds during the admission.  Discharge dry weight was 142.5 pounds. Abdominal ultrasound obtained on 07/14/2018 showed moderate ascites in all 4 quadrants.  No evidence of intra-abdominal infection was present.  Troponin was only borderline elevated at 0.06, given lack of chest discomfort, this was suspected to be related to volume overload.  Lower extremity Doppler was obtained due to elevated d-dimer, this did not show any evidence of DVT.  Patient was contacted today via telephone visit.  She denies any significant shortness of breath or chest discomfort.  According to her son, her current weight is 148 pounds.  She has gained at least 5-1/2 pounds since discharge.  Her son also mentions patient has been having increasing ankle edema and abdominal distention.  I instructed the son to give her extra dose of Lasix and a potassium for the next 3 days before going back to the previous dose.  Family also requested 90-day refill for the Eliquis as well.  Patient will require a repeat basic metabolic panel next week.  I will arrange a 3 to 4 weeks follow-up in clinic with Dr. Tresa Endo.  The patient does not have symptoms concerning for COVID-19 infection (fever, chills, cough, or new shortness of breath).    Past Medical History:  Diagnosis Date   CHF (congestive heart failure) (HCC)    Diabetes mellitus without complication (HCC)    Hypertension    LVH (left ventricular hypertrophy)  Mitral regurgitation    mod to severe   Permanent atrial fibrillation    Presence of permanent cardiac pacemaker    Pulmonary hypertension (HCC)    severe   Thyroid disease    Tricuspid regurgitation    severe   Past Surgical History:  Procedure Laterality Date   ABDOMINAL HYSTERECTOMY     CARDIOVERSION   5 /18/ 2000   unsuccessful   CHOLECYSTECTOMY     COMPRESSION HIP SCREW Right 06/06/2012   Procedure: Open Reduction Internal Fixation Right HIp;  Surgeon: Dannielle Huh, MD;  Location: [facility];  Service: Orthopedics;  Laterality: Right;   ESOPHAGOGASTRODUODENOSCOPY N/A 06/09/2015   Procedure: ESOPHAGOGASTRODUODENOSCOPY (EGD);  Surgeon: Charlott Rakes, MD;  Location: [facility] ENDOSCOPY;  Service: Endoscopy;  Laterality: N/A;   knee replacement     R   NM MYOCAR PERF WALL MOTION  02/28/2009   No signficant ischemia   PACEMAKER INSERTION  05/26/2004   St.Jude   PPM GENERATOR CHANGEOUT N/A 08/17/2017   Procedure: PPM GENERATOR CHANGEOUT;  Surgeon: Thurmon Fair, MD;  Location: [facility] INVASIVE CV LAB;  Service: Cardiovascular;  Laterality: N/A;   THYROIDECTOMY     TUBAL LIGATION     US ECHOCARDIOGRAPHY  02/25/2011   mod to severe MR,LA severe dilated,mild annular ca+,mod. TR,mod PI,mod ca+ of the aortic valve leaflet     No outpatient medications have been marked as taking for the 08/03/18 encounter (Telemedicine) with Azalee Course, PA.     Allergies:   Iohexol and Shrimp [shellfish allergy]   Social History   Tobacco Use   Smoking status: Never Smoker   Smokeless tobacco: Never Used  Substance Use Topics   Alcohol use: No   Drug use: No     Family Hx: The patient's family history includes Asthma in her mother; Cancer - Colon in her brother; Gallbladder disease in her maternal grandmother; Heart attack in her brother and father; Heart disease in her child; Stroke in her maternal grandfather and sister.  ROS:   Please see the history of present illness.     All other systems reviewed and are negative.   Prior CV studies:   The following studies were reviewed today:  Echo 04/28/2017 LV EF: 50% -   55%  ------------------------------------------------------------------- Indications:      I50.42 Chronic combined systolic and diastolic heart  failure.  ------------------------------------------------------------------- History:   PMH:  Acquired from the patient and from the patient&'s chart.  PMH:  Congestive heart failure. Pulmonary hypertension. Atrial fibrillation. Thyroid disease.  Risk factors:  Hypertension. Diabetes mellitus.  ------------------------------------------------------------------- Study Conclusions  - Left ventricle: Wall thickness was increased in a pattern of mild   LVH. Systolic function was normal. The estimated ejection   fraction was in the range of 50% to 55%. The study is not   technically sufficient to allow evaluation of LV diastolic   function. - Aortic valve: There was mild stenosis. There was trivial   regurgitation. - Mitral valve: Moderately calcified annulus. Moderately thickened,   mildly calcified leaflets . There was mild regurgitation. - Left atrium: The atrium was severely dilated. - Right ventricle: The cavity size was mildly dilated. - Right atrium: The atrium was severely dilated. - Atrial septum: No defect or patent foramen ovale was identified. - Tricuspid valve: There was moderate regurgitation.   PPM implantation 08/17/2017 Device details:   [employer]. Jude Assurity  model number [id number], serial number [id number] Right atrial lead (chronic) St. Jude , model number [id number], serial  [id number] (implanted 05/26/2004) Right ventricular lead (chronic)  St. Jude, model number [id number], serial number [id number] (implanted 05/26/2004)  Labs/Other Tests and Data Reviewed:    EKG:  An ECG dated 06/17/2018 was personally reviewed today and demonstrated:  Ventricularly paced rhythm  Recent Labs: 06/18/2018: B Natriuretic Peptide 317.2 06/19/2018: TSH 3.620 06/20/2018: Hemoglobin 11.2; Magnesium 1.6; Platelets 245 06/21/2018: BUN 26; Creatinine, Ser 1.04; Potassium 4.7; Sodium 133   Recent Lipid Panel No results found for: CHOL, TRIG, HDL, CHOLHDL, LDLCALC, LDLDIRECT  Wt  Readings from Last 3 Encounters:  08/02/18 148 lb 3.2 oz (67.2 kg)  06/21/18 142 lb 8 oz (64.6 kg)  05/10/18 160 lb (72.6 kg)     Objective:    Vital Signs:  BP 122/71    Temp 97.9 F (36.6 C)    Resp 18    Ht  (1.575 m)    Wt 148 lb 3.2 oz (67.2 kg)    SpO2 95%    BMI 27.11 kg/m    VITAL SIGNS:  reviewed  ASSESSMENT & PLAN:    1. Acute on chronic combined systolic and diastolic heart failure: Since discharge, she has gained 5-1/2 pounds.  I instructed son to give her additional dose of Lasix along with potassium supplement for 3 days before going back to the previous dose.  We discussed salt restriction and fluid restriction and daily weight.  She will need a basic metabolic panel either next Tuesday or Wednesday by home health nurse.  2. Permanent atrial fibrillation s/p AV nodal ablation: Pacemaker dependent.  On Eliquis  3. History of pacemaker: Followed by Dr. Royann Shivers.  Last device interrogation was in March 2020.  4. Hypertension: Blood pressure stable on current therapy.  5. Mitral valve regurgitation: Although she had a prior history of moderate to severe mitral regurgitation, last echocardiogram obtained in February 2019 showed mild mitral regurgitation.  6. DM2: Managed by primary care provider.  7. Ascites: Noted during recent hospitalization for acute heart failure.  Patient's son says she continued to have some degree of abdominal distention  COVID-19 Education: The signs and symptoms of COVID-19 were discussed with the patient and how to seek care for testing (follow up with PCP or arrange E-visit).  The importance of social distancing was discussed today.  Time:   Today, I have spent 10 minutes with the patient with telehealth technology discussing the above problems.     Medication Adjustments/Labs and Tests Ordered: Current medicines are reviewed at length with the patient today.  Concerns regarding medicines are outlined above.   Tests Ordered: No  orders of the defined types were placed in this encounter.   Medication Changes: Meds ordered this encounter  Medications   DISCONTD: apixaban (ELIQUIS) 5 MG TABS tablet    Sig: Take 1 tablet (5 mg total) by mouth 2 (two) times daily.    Dispense:  180 tablet    Refill:  2   apixaban (ELIQUIS) 5 MG TABS tablet    Sig: Take 1 tablet (5 mg total) by mouth 2 (two) times daily.    Dispense:  180 tablet    Refill:  2   furosemide (LASIX) 40 MG tablet    Sig: Take 1 tablet (40 mg total) by mouth daily.    Dispense:  30 tablet    Refill:  2    Disposition:  Follow up in 3 week(s)  Signed, Azalee Course, PA  08/05/2018 11:18 PM    [facility]

## 2018-08-11 ENCOUNTER — Telehealth: Payer: Self-pay | Admitting: Cardiovascular Disease

## 2018-08-11 NOTE — Telephone Encounter (Signed)
From PN dated 08-03-2018 1. Acute on chronic combined systolic and diastolic heart failure: Since discharge, she has gained 5-1/2 pounds.  I instructed son to give her additional dose of Lasix along with potassium supplement for 3 days before going back to the previous dose.  We discussed salt restriction and fluid restriction and daily weight.  She will need a basic metabolic panel either next Tuesday or Wednesday by home health nurs

## 2018-08-11 NOTE — Telephone Encounter (Signed)
Asher Muir, Home Health Nurse for [facility] needs lab orders for the patient. She can accept verbal orders, or you can fax the orders to  [phone]

## 2018-08-11 NOTE — Telephone Encounter (Signed)
Faxed via EPIC

## 2018-08-11 NOTE — Telephone Encounter (Signed)
Tried to call number provided, both are fax numbers

## 2018-08-12 NOTE — Telephone Encounter (Signed)
Will be d/c Wednesday and labs will be drawn then also. Denise [name] will fax the labs when the results are in

## 2018-08-17 ENCOUNTER — Other Ambulatory Visit: Payer: Self-pay

## 2018-08-17 MED ORDER — METOPROLOL SUCCINATE ER 50 MG PO TB24: 50.0000 mg | ORAL_TABLET | Freq: Every day | ORAL | 3 refills | Status: DC

## 2018-08-30 ENCOUNTER — Ambulatory Visit (INDEPENDENT_AMBULATORY_CARE_PROVIDER_SITE_OTHER): Payer: Medicare Other | Admitting: *Deleted

## 2018-08-30 DIAGNOSIS — I442 Atrioventricular block, complete: Secondary | ICD-10-CM

## 2018-08-30 LAB — CUP PACEART REMOTE DEVICE CHECK
Battery Remaining Longevity: 121 mo
Battery Remaining Percentage: 95.5 %
Battery Voltage: 3.02 V
Brady Statistic RV Percent Paced: 98 %
Date Time Interrogation Session: 20200616065802
Implantable Lead Implant Date: 20060313
Implantable Lead Implant Date: 20060313
Implantable Lead Location: 753859
Implantable Lead Location: 753860
Implantable Pulse Generator Implant Date: 20190604
Lead Channel Impedance Value: 450 Ohm
Lead Channel Pacing Threshold Amplitude: 1 V
Lead Channel Pacing Threshold Pulse Width: 0.5 ms
Lead Channel Sensing Intrinsic Amplitude: 8.5 mV
Lead Channel Setting Pacing Amplitude: 1.25 V
Lead Channel Setting Pacing Pulse Width: 0.5 ms
Lead Channel Setting Sensing Sensitivity: 4 mV
Pulse Gen Model: 2272
Pulse Gen Serial Number: 9029685

## 2018-09-10 ENCOUNTER — Encounter: Payer: Self-pay | Admitting: Cardiology

## 2018-09-10 NOTE — Progress Notes (Signed)
Remote pacemaker transmission.   

## 2018-10-06 ENCOUNTER — Other Ambulatory Visit: Payer: Self-pay | Admitting: Cardiovascular Disease

## 2018-10-06 MED ORDER — ISOSORBIDE MONONITRATE ER 30 MG PO TB24: 15.0000 mg | ORAL_TABLET | Freq: Every day | ORAL | 3 refills | Status: DC

## 2018-10-06 NOTE — Telephone Encounter (Signed)
 *  STAT* If patient is at the pharmacy, call can be transferred to refill team.   1. Which medications need to be refilled? (please list name of each medication and dose if known) isosorbide mononitrate (IMDUR) 30 MG 24 hr tablet  2. Which pharmacy/location (including street and city if local pharmacy) is medication to be sent to? [place]   3. Do they need a 30 day or 90 day supply? [place]

## 2018-10-06 NOTE — Telephone Encounter (Signed)
Rx(s) sent to pharmacy electronically.  

## 2018-10-12 ENCOUNTER — Other Ambulatory Visit: Payer: Self-pay | Admitting: *Deleted

## 2018-10-12 MED ORDER — METOPROLOL SUCCINATE ER 50 MG PO TB24: 50.0000 mg | ORAL_TABLET | Freq: Every day | ORAL | 1 refills | Status: DC

## 2018-10-26 ENCOUNTER — Other Ambulatory Visit: Payer: Self-pay

## 2018-10-26 ENCOUNTER — Other Ambulatory Visit: Payer: Self-pay | Admitting: Cardiovascular Disease

## 2018-10-26 MED ORDER — ISOSORBIDE MONONITRATE ER 30 MG PO TB24: 15.0000 mg | ORAL_TABLET | Freq: Every day | ORAL | 2 refills | Status: DC

## 2018-10-26 NOTE — Telephone Encounter (Signed)
Rx(s) sent to pharmacy electronically.  

## 2018-11-29 ENCOUNTER — Ambulatory Visit (INDEPENDENT_AMBULATORY_CARE_PROVIDER_SITE_OTHER): Payer: Medicare Other | Admitting: *Deleted

## 2018-11-29 DIAGNOSIS — I442 Atrioventricular block, complete: Secondary | ICD-10-CM

## 2018-11-29 DIAGNOSIS — I5043 Acute on chronic combined systolic (congestive) and diastolic (congestive) heart failure: Secondary | ICD-10-CM

## 2018-11-29 LAB — CUP PACEART REMOTE DEVICE CHECK
Battery Remaining Longevity: 119 mo
Battery Remaining Percentage: 95.5 %
Battery Voltage: 3.02 V
Brady Statistic RV Percent Paced: 97 %
Date Time Interrogation Session: 20200915073620
Implantable Lead Implant Date: 20060313
Implantable Lead Implant Date: 20060313
Implantable Lead Location: 753859
Implantable Lead Location: 753860
Implantable Pulse Generator Implant Date: 20190604
Lead Channel Impedance Value: 450 Ohm
Lead Channel Pacing Threshold Amplitude: 1.125 V
Lead Channel Pacing Threshold Pulse Width: 0.5 ms
Lead Channel Sensing Intrinsic Amplitude: 9 mV
Lead Channel Setting Pacing Amplitude: 1.375
Lead Channel Setting Pacing Pulse Width: 0.5 ms
Lead Channel Setting Sensing Sensitivity: 4 mV
Pulse Gen Model: 2272
Pulse Gen Serial Number: 9029685

## 2018-12-06 NOTE — Progress Notes (Signed)
Remote pacemaker transmission.   

## 2018-12-13 ENCOUNTER — Telehealth: Payer: Self-pay | Admitting: Cardiovascular Disease

## 2018-12-13 NOTE — Telephone Encounter (Signed)
Noted, thanks!

## 2018-12-13 NOTE — Telephone Encounter (Signed)
Spoke to pt. She stated she is willing to do a telephone visit with Dr. Claiborne Billings on 10/2. She stated her son has an appt here on the same day and he was going to bring her to her visit with Dr. Claiborne Billings. Pt stated she was not sure that her son would be able to be at home with her for telephone visit, but that she is willig to try it.   She stated her daughter, Arville Go, is a Marine scientist and could help get her blood pressure. Pt then stated that her daughter works and she is not sure if her daughter could be available to be with her during telephone visit. Pt stated ok for me to call Joann since she is on DPR to find out.  Informed pt I will call daughter and call her back to let her know if I am able to reach her. Informed pt that it would be better to have someone with her in case she needs help. Pt verbalized thanks and understanding.  Attempted to call pt daughter to talk about appt on 10/2. Pt daughter stated I needed to talk to Jeneen Rinks, pt son. Attempted to reach [place]. He did not answer and no vm set up.   Will attempt to call again.

## 2018-12-16 ENCOUNTER — Telehealth: Payer: Self-pay

## 2018-12-16 ENCOUNTER — Encounter: Payer: Self-pay | Admitting: Cardiovascular Disease

## 2018-12-16 ENCOUNTER — Telehealth (INDEPENDENT_AMBULATORY_CARE_PROVIDER_SITE_OTHER): Payer: Medicare Other | Admitting: Cardiovascular Disease

## 2018-12-16 DIAGNOSIS — I10 Essential (primary) hypertension: Secondary | ICD-10-CM | POA: Diagnosis not present

## 2018-12-16 DIAGNOSIS — I4821 Permanent atrial fibrillation: Secondary | ICD-10-CM | POA: Diagnosis not present

## 2018-12-16 DIAGNOSIS — E782 Mixed hyperlipidemia: Secondary | ICD-10-CM

## 2018-12-16 DIAGNOSIS — I5042 Chronic combined systolic (congestive) and diastolic (congestive) heart failure: Secondary | ICD-10-CM | POA: Diagnosis not present

## 2018-12-16 DIAGNOSIS — Z7901 Long term (current) use of anticoagulants: Secondary | ICD-10-CM

## 2018-12-16 DIAGNOSIS — Z950 Presence of cardiac pacemaker: Secondary | ICD-10-CM

## 2018-12-16 NOTE — Telephone Encounter (Signed)
During televisit this morning- I spoke with daughter and patient- advised them that the home health orders would need to come from her primary care doctor. I did order lab work- once they get the okay to have home health come back in to assist patient, they can have them draw the labs.  Dr.Kelly also made aware.

## 2018-12-16 NOTE — Progress Notes (Signed)
Virtual Visit via Telephone Note   This visit type was conducted due to national recommendations for restrictions regarding the COVID-19 Pandemic (e.g. social distancing) in an effort to limit this patient's exposure and mitigate transmission in our community.  Due to her co-morbid illnesses, this patient is at least at moderate risk for complications without adequate follow up.  This format is felt to be most appropriate for this patient at this time.  The patient did not have access to video technology/had technical difficulties with video requiring transitioning to audio format only (telephone).  All issues noted in this document were discussed and addressed.  No physical exam could be performed with this format.  Please refer to the patient's chart for her  consent to telehealth for [facility].   Date:  12/16/2018   ID:  Denise [name], DOB 08-01-1923, MRN [id number]  Patient Location: Home Provider Location: Home  PCP:  Daryl Eastern, MD  Cardiologist:  Nicki Guadalajara, MD  Electrophysiologist:  None   Evaluation Performed:  Follow-Up Visit  Chief Complaint: 13 month  F/U  History of Present Illness:    Denise [name] is a 85 y.o. female who has a history of permanent atrial fibrillation and is status post AV node ablation by Dr. Severiano Gilbert and permanent pacemaker implantation in June 2006. She has a history of hypertensive cardiomyopathy. She has documented severe LA dilatation. On an echo in December 2012 ejection fraction was 45-50%. She had moderate to severe MR, moderate pulmonary hypertension a nodular sclerosis of the aortic valve the  She has a dual-chamber St. Jude identity permanent pacemaker. She is followed by Dr. Royann Shivers for her pacemaker. Pacemaker interrogation revealed normal function, and no reprogramming changes were necessary.  She is pacemaker dependent and no escape rhythm was present.  When the pacemaker was taken down to 30 bpm.  She had excellent lead  parameters and no high ventricular rates were recorded.  She was last evaluated by him on 07/09/2015.  In March 2014 she broke her right hip and her right arm and required surgery by Dr. Valentina Gu. After being hospitalized she went to a nursing facility .  In 2017 an echo Doppler study showed an EF of 45-50% with a questionable region of possible septal and inferior hypokinesis.  There was moderately severe tricuspid regurgitation.  There is moderate bony hypertension with PA pressure 56 mm.  She sees Dr. Royann Shivers for pacemaker.  She is programmed in VVIR since she is now on permanent atrial fibrillation.  There was 96% ventricular pacing with an underlying escape rhythm at 34 bpm.  Her St. Jude identity pacemaker has only 6 months of estimated longevity.  Since I last saw her in September 2018 she denies any episodes of chest pain or shortness of breath.  She saw Dr. Royann Shivers 02/17/2017 for follow-up evaluation.  She has chronic combined systolic and diastolic heart failure near Heart Association class II.  She has permanent atrial fibrillation.  She has pacing-induced dyssynchrony but does not appear to need CRRT.  She denied any dyspnea.  She has underlying complete heart block and in the past had a reasonable reliable escape rhythm, but there is been no escape rhythm in the last 2 office evaluation device checks.  She is pacemaker dependent following her AV node ablation.    She underwent a follow-up echo Doppler study on 05/03/2017.  EF is 50-55%.  There was mild aortic sclerosis.  There was moderate mitral annular calcification with mild MR,  there was severe biatrial enlargement.  There was moderate tricuspid regurgitation.    She underwent replacement of her dual-chamber Saint Jude identity pacemaker with an Essure T MRI device in June 2019 by Dr. Royann Shivers.  This was programmed to VVIR due to her underlying permanent atrial fibrillation.  She is pacemaker dependent.  Her new device is amenable to  remote monitoring but she lives in an area where there is very poor cellular signal.  She denies any episodes of chest pain.  At times she has noticed some vertigo symptoms.  She continues to be on Coumadin for anticoagulation.  She denies PND orthopnea.  She denies bleeding.  She has a history of previous documentation of a Hollenhorst plaque and has been on statin therapy.  I last saw her in September 2019 at which time her blood pressure was stable.  Her ECG showed ventricular pacing. She was on amlodipine 5 mg, HCTZ 12.5 mg, ramipril 5 mg in addition to Toprol-XL 50 mg daily.  She continued to be on atorvastatin with target LDL less than 70.  Since I last saw her she was hospitalized from April 3 -7, 2020 with increased lower extremity edema and abdominal swelling.  BNP was greater than 300. There was minimal troponin elevation at her outside hospital felt to be due to demand ischemia.  D-dimer was 3280 at an outside hospital and lower extremity Doppler studies were negative for DVT.  She is anticoagulated with Eliquis  in light of her permanent atrial fibrillation status post AV node ablation and is permanent pacemaker dependent and had V paced rhythm on telemetry with controlled rate.  Following her hospital discharge, your health care group from [facility] was involved in her postoperative care.  Presently, she has been feeling well.  She denies any leg swelling.  According to her daughter, Denise [name], she does have some abdominal bloating.  She believes she may have increased gas.  She denies any episodes of chest pain.  She has permanent atrial fibrillation.  She gets periodic device checks.  She has not had any recent laboratory since April.  She is now on Lasix 40 mg on Monday Wednesday and Friday, HCTZ 25 mg daily in addition to Eliquis at a reduced dose of 2.5 mg.  She now presents for a telemedicine visit.   The patient does not have symptoms concerning for COVID-19 infection  (fever, chills, cough, or new shortness of breath).    Past Medical History:  Diagnosis Date   CHF (congestive heart failure) (HCC)    Diabetes mellitus without complication (HCC)    Hypertension    LVH (left ventricular hypertrophy)    Mitral regurgitation    mod to severe   Permanent atrial fibrillation (HCC)    Presence of permanent cardiac pacemaker    Pulmonary hypertension (HCC)    severe   Thyroid disease    Tricuspid regurgitation    severe   Past Surgical History:  Procedure Laterality Date   ABDOMINAL HYSTERECTOMY     CARDIOVERSION  5 /18/ 2000   unsuccessful   CHOLECYSTECTOMY     COMPRESSION HIP SCREW Right 06/06/2012   Procedure: Open Reduction Internal Fixation Right HIp;  Surgeon: Dannielle Huh, MD;  Location: [facility] OR;  Service: Orthopedics;  Laterality: Right;   ESOPHAGOGASTRODUODENOSCOPY N/A 06/09/2015   Procedure: ESOPHAGOGASTRODUODENOSCOPY (EGD);  Surgeon: Charlott Rakes, MD;  Location: [facility] ENDOSCOPY;  Service: Endoscopy;  Laterality: N/A;   knee replacement     R   NM  MYOCAR PERF WALL MOTION  02/28/2009   No signficant ischemia   PACEMAKER INSERTION  05/26/2004   St.Jude   PPM GENERATOR CHANGEOUT N/A 08/17/2017   Procedure: PPM GENERATOR CHANGEOUT;  Surgeon: Sanda Klein, MD;  Location: [place] [facility];  Service: Cardiovascular;  Laterality: N/A;   THYROIDECTOMY     TUBAL LIGATION     US ECHOCARDIOGRAPHY  02/25/2011   mod to severe MR,LA severe dilated,mild annular ca+,mod. TR,mod PI,mod ca+ of the aortic valve leaflet     Current Meds  Medication Sig   amLODipine (NORVASC) 5 MG tablet TAKE 1 TABLET (5 MG TOTAL) BY MOUTH EVERY MORNING. (Patient taking differently: Take 5 mg by mouth daily. )   apixaban (ELIQUIS) 5 MG TABS tablet Take 1 tablet (5 mg total) by mouth 2 (two) times daily.   atorvastatin (LIPITOR) 10 MG tablet Take 10 mg by mouth every evening.   furosemide (LASIX) 40 MG tablet Take 1 tablet (40 mg total) by mouth  daily. (Patient taking differently: Take 40 mg by mouth daily. )   hydrochlorothiazide (HYDRODIURIL) 25 MG tablet Take 0.5 tablets (12.5 mg total) by mouth every morning. (Patient taking differently: Take 25 mg by mouth every morning. )   isosorbide mononitrate (IMDUR) 30 MG 24 hr tablet TAKE 1 TABLET (30 MG TOTAL) BY MOUTH EVERY MORNING.   methimazole (TAPAZOLE) 10 MG tablet Take 5 mg by mouth daily.   metoprolol succinate (TOPROL-XL) 50 MG 24 hr tablet Take 1 tablet (50 mg total) by mouth daily. Take with or immediately following a meal.   potassium chloride SA (K-DUR,KLOR-CON) 20 MEQ tablet Take 1-2 tablets (20-40 mEq total) by mouth daily as directed.   ramipril (ALTACE) 5 MG capsule Take 1 capsule (5 mg total) by mouth daily.     Allergies:   Iohexol and Shrimp [shellfish allergy]   Social History   Tobacco Use   Smoking status: Never Smoker   Smokeless tobacco: Never Used  Substance Use Topics   Alcohol use: No   Drug use: No     Family Hx: The patient's family history includes Asthma in her mother; Cancer - Colon in her brother; Gallbladder disease in her maternal grandmother; Heart attack in her brother and father; Heart disease in her child; Stroke in her maternal grandfather and sister.  ROS:   Please see the history of present illness.    No fevers chills or night sweats No wheezing No chest pain Positive for abdominal bloating No leg swelling No awareness of palpitations Sleeping well All other systems reviewed and are negative.   Prior CV studies:   The following studies were reviewed today:  ------------------------------------------------------------------- ECHO 04/28/2017 Study Conclusions  - Left ventricle: Wall thickness was increased in a pattern of mild   LVH. Systolic function was normal. The estimated ejection   fraction was in the range of 50% to 55%. The study is not   technically sufficient to allow evaluation of LV diastolic    function. - Aortic valve: There was mild stenosis. There was trivial   regurgitation. - Mitral valve: Moderately calcified annulus. Moderately thickened,   mildly calcified leaflets . There was mild regurgitation. - Left atrium: The atrium was severely dilated. - Right ventricle: The cavity size was mildly dilated. - Right atrium: The atrium was severely dilated. - Atrial septum: No defect or patent foramen ovale was identified. - Tricuspid valve: There was moderate regurgitation.   Her most recent hospitalization from April 3 through April 09/2018 was  reviewed in detail.  Labs/Other Tests and Data Reviewed:    EKG:  An ECG dated 06/17/2018 was personally reviewed today and demonstrated:  Ventricular paced at 72.  Recent Labs: 06/18/2018: B Natriuretic Peptide 317.2 06/19/2018: TSH 3.620 06/20/2018: Hemoglobin 11.2; Magnesium 1.6; Platelets 245 06/21/2018: BUN 26; Creatinine, Ser 1.04; Potassium 4.7; Sodium 133   Recent Lipid Panel No results found for: CHOL, TRIG, HDL, CHOLHDL, LDLCALC, LDLDIRECT  Wt Readings from Last 3 Encounters:  12/16/18 146 lb (66.2 kg)  08/02/18 148 lb 3.2 oz (67.2 kg)  06/21/18 142 lb 8 oz (64.6 kg)     Objective:    Vital Signs:  BP 116/64    Ht 5\' 2"  (1.575 m)    Wt 146 lb (66.2 kg)    BMI 26.70 kg/m    Since this was a phone virtual visit I could not perform a physical exam.  However, the patient's breathing appeared normal and not labored.  There was no audible wheezing. There was no chest pain The patient has a pacemaker and her rhythm was regular and has been previously noted to be V paced. Daughter admits to some abdominal bloating. No leg swelling No new neurologic symptoms Appropriate affect.  ASSESSMENT & PLAN:    1. Chronic systolic and diastolic heart failure: The patient's recent hospitalization was reviewed.  She was admitted with volume overload and did well with IV diuresis with 15 pound weight loss.  Her last echo Doppler study in  2019 showed an EF of 50 to 55%.  There is no edema presently on her current regimen of Lasix 40 mg on Monday Wednesday and Friday, HTCZ 25 mg, ramipril 5 mg, 2. Essential hypertension: Blood pressure today is stable on the above medical regimen in addition to amlodipine 5 mg daily 3. Permanent atrial fibrillation: On Eliquis 2.5 mg for anticoagulation. 4. Pacemaker: She is pacemaker dependent.  Patient maintains V pacing with underlying A. fib.  She undergoes remote device checks. 5.   Hyperlipidemia: Currently on atorvastatin 10 mg daily.        COVID-19 Education: The signs and symptoms of COVID-19 were discussed with the patient and how to seek care for testing (follow up with PCP or arrange E-visit).  The importance of social distancing was discussed today.  Time:   Today, I have spent 25 minutes with the patient with telehealth technology discussing the above problems.     Medication Adjustments/Labs and Tests Ordered: Current medicines are reviewed at length with the patient today.  Concerns regarding medicines are outlined above.   Tests Ordered: No orders of the defined types were placed in this encounter.   Medication Changes: No orders of the defined types were placed in this encounter.   Follow Up: We will try to arrange for home health to come to the patient's home and draw laboratory with a CMet, CBC and BMP level.  If this cannot be arranged through us then it should be arranged through her primary physician, Dr. Darius BumpEller in [place].  Plan follow-up office visit or telemedicine visit in 6 months  Signed, Nicki Guadalajarahomas Helmer Dull, MD  12/16/2018 10:13 AM    [facility]

## 2018-12-16 NOTE — Telephone Encounter (Signed)
Spoke with pt son during his OV with Dr. Gwenlyn Found . He stated that he would like to set up home health visits with [facility] for the pt. Informed him that nurse would reach out to Dr. Evette Georges nurse regarding this.   He also stated that he would like pt to have a flu shot but does not want to bring her out of her home and increase her exposure risk for COVID-19. Consulted Satira Sark who discussed with pt son [facility] as an option for possible drive-thru flu shot. Pt son states he will follow up [facility] and pt PCP.

## 2018-12-16 NOTE — Telephone Encounter (Signed)
After speaking with daughter she wants this done with home health nurse- and have them come in again.  Dr.Kelly and myself advised her to have her PCP set up for home health- labs were ordered when they got this completed.

## 2018-12-16 NOTE — Patient Instructions (Signed)
Medication Instructions:  The current medical regimen is effective;  continue present plan and medications.  If you need a refill on your cardiac medications before your next appointment, please call your pharmacy.   Lab work: CMET, CBC, BNP (with home health service) If you have labs (blood work) drawn today and your tests are completely normal, you will receive your results only by: [name] MyChart Message (if you have MyChart) OR . A paper copy in the mail If you have any lab test that is abnormal or we need to change your treatment, we will call you to review the results.  Follow-Up: At [facility], you and your health needs are our priority.  As part of our continuing mission to provide you with exceptional heart care, we have created designated Provider Care Teams.  These Care Teams include your primary Cardiologist (physician) and Advanced Practice Providers (APPs -  Physician Assistants and Nurse Practitioners) who all work together to provide you with the care you need, when you need it. You will need a follow up appointment in 6 months.  Please call our office 2 months in advance to schedule this appointment.  You may see Shelva Majestic, MD or one of the following Advanced Practice Providers on your designated Care Team: [place], [place] . Fabian Sharp, PA-C  Contact your Primary Care Doctor to get set back up with home health- for labs and flu shot.

## 2018-12-16 NOTE — Telephone Encounter (Signed)
Spoke with daughter this morning for virtual- was able to get patient set up, and awaiting to speak to Dr.Kelly.

## 2018-12-16 NOTE — Telephone Encounter (Signed)
Pt's Son was here for appointment with Dr Gwenlyn Found. He states that pt does not want to come into the office to have flu shot and lab draw done. Informed him that we do not do flu shot at nurse home visit but, we can order a home health lab draw. Looks like pt has BNP, CBC AND CMET active in the system from televisit today. Please order nurse visit, thank you

## 2018-12-20 ENCOUNTER — Other Ambulatory Visit: Payer: Self-pay | Admitting: Cardiovascular Disease

## 2018-12-21 ENCOUNTER — Other Ambulatory Visit: Payer: Self-pay | Admitting: Cardiovascular Disease

## 2018-12-23 NOTE — Telephone Encounter (Signed)
Son of the patient called and wanted to let Dr. Claiborne Billings know that the patient's PCP refused to write orders for a home Health Nurse to come see the patient and do a physical. The son states that the PCP wants her to come in for her yearly physical. He feels that if it is not safe for her to come see the Cardiologist, then why is it safe for her to see her PCP.  The son is at a loss and does not know what to do next .

## 2018-12-26 ENCOUNTER — Telehealth: Payer: Self-pay

## 2018-12-26 NOTE — Telephone Encounter (Signed)
Please advise?  We can do the orders for the lab draw, but not sure what else to do since PCP will not sign without yearly follow up- will you sign for the home health orders?

## 2018-12-26 NOTE — Telephone Encounter (Signed)
Not needed. Error   

## 2019-01-04 NOTE — Telephone Encounter (Signed)
ok 

## 2019-01-05 ENCOUNTER — Other Ambulatory Visit: Payer: Self-pay | Admitting: Cardiovascular Disease

## 2019-01-06 ENCOUNTER — Telehealth: Payer: Self-pay | Admitting: Cardiovascular Disease

## 2019-01-06 ENCOUNTER — Other Ambulatory Visit: Payer: Self-pay | Admitting: Cardiovascular Disease

## 2019-01-06 MED ORDER — ATORVASTATIN CALCIUM 10 MG PO TABS: 10.0000 mg | ORAL_TABLET | Freq: Every evening | ORAL | 3 refills | Status: DC

## 2019-01-06 MED ORDER — FUROSEMIDE 40 MG PO TABS: ORAL_TABLET | ORAL | 3 refills | Status: DC

## 2019-01-06 NOTE — Telephone Encounter (Signed)
Patient son is calling in regards to his mother to see what labs Dr. Claiborne Billings wants done so that when his mother gets her blood drawn from another provider if they can be sent to Dr. Evette Georges office so that she will not have to have blood drawn again.

## 2019-01-06 NOTE — Telephone Encounter (Signed)
Attempted to call [facility] and they are closed until 9 am will try back later this morning.

## 2019-01-06 NOTE — Telephone Encounter (Signed)
Patient will have PCP draw labs on Monday- faxed over orders, spoke to son; no other questions or concerns.

## 2019-01-06 NOTE — Telephone Encounter (Addendum)
Pts son called to report the pt is our of her Lipitor and lasix.Marland KitchenMarland Kitchen I advised him I will send in a  90 day refill per his request... she is still taking her Eliquis without fail... he needs her Tapazole refilled but will talk with the MD following her Thyroid that she has been referred to.. Dr. Thereasa Distance.

## 2019-01-06 NOTE — Telephone Encounter (Signed)
Attempted to call son back- spoke with patient, she will tell him when he comes back- tried his cell phone and voicemail was not set up.  Will try again.

## 2019-01-06 NOTE — Telephone Encounter (Signed)
New message:     [facility] calling about some medication refills. Please call back.

## 2019-01-06 NOTE — Telephone Encounter (Signed)
New Message    *STAT* If patient is at the pharmacy, call can be transferred to refill team.   1. Which medications need to be refilled? (please list name of each medication and dose if known) atorvastatin (LIPITOR) 10 MG tablet, methimazole (TAPAZOLE) 10 MG tablet and furosemide (LASIX) 40 MG tablet     2. Which pharmacy/location (including street and city if local pharmacy) is medication to be sent to? [facility] - [place], [place] - [address]  3. Do they need a 30 day or 90 day supply? [place]

## 2019-01-06 NOTE — Telephone Encounter (Signed)
Spoke to son- epic faxed over orders to PCP as they are going on Monday morning to have blood work for her medication renewal and wanted to have all the labs drawn at the same time-  Advised I faxed them, but if any issues to have them call us on Monday. Patient son verbalized understanding.

## 2019-01-09 ENCOUNTER — Telehealth: Payer: Self-pay | Admitting: Cardiovascular Disease

## 2019-01-09 NOTE — Telephone Encounter (Signed)
Atorvastatin & furosemide refilled 01/06/2019 Tapazole is non-cardiac medication

## 2019-01-09 NOTE — Telephone Encounter (Signed)
New Message  Patient's husband is calling in to confirm that it is ok to get blood work that orders were put in for by [facility] today and will be faxed over. Please call patient's husband to confirm.

## 2019-01-10 NOTE — Telephone Encounter (Signed)
Tried calling pt. Pt unaware of anything to do with labs. Husband has been deceased for 30 years... Advised to call son, Jeneen Rinks. Called pt son Jeneen Rinks and no voicemail set up.[name]

## 2019-01-10 NOTE — Telephone Encounter (Signed)
See telephone note in chart for refill.

## 2019-01-11 ENCOUNTER — Telehealth: Payer: Self-pay | Admitting: Cardiovascular Disease

## 2019-01-11 NOTE — Telephone Encounter (Signed)
Triage nurse checked Dr. Evette Georges mailbox. Results from pt PCP were faxed to office on 10/28 and are in Dr. Evette Georges mailbox.   Attempted call to pt son x 2. Unable to reach him and unable to LVM d/t automated message stating that pt has a voice mailbox that has not yet been set up

## 2019-01-11 NOTE — Telephone Encounter (Signed)
Son wanted to know if Dr. Claiborne Billings received results from her PCP Office. The son took the patient to the PCP office on Monday for some labs, and the PCP was going to fax them to Dr. Claiborne Billings. If Dr. Claiborne Billings got everything he needed, they can cancel the home health nurse to draw the patient's blood.  The son states he does not have great cell service in his house, so contacting him can be a challenge at times.

## 2019-01-16 ENCOUNTER — Telehealth: Payer: Self-pay | Admitting: Physician Assistant

## 2019-01-16 NOTE — Telephone Encounter (Signed)
I have attempted to contact patient son multiple times- call just hangs up, does not ring. Will try again later.

## 2019-01-16 NOTE — Telephone Encounter (Signed)
New Message:   Please call, son have a question about pt's Furosemide.

## 2019-01-17 ENCOUNTER — Telehealth: Payer: Self-pay | Admitting: Cardiovascular Disease

## 2019-01-17 MED ORDER — FUROSEMIDE 40 MG PO TABS: ORAL_TABLET | ORAL | 2 refills | Status: DC

## 2019-01-17 NOTE — Telephone Encounter (Signed)
Resubmitted RX- sent with updated signature to fix on chart as well.

## 2019-01-17 NOTE — Telephone Encounter (Signed)
Per last documented note- patient is on lasix 40 mg daily.  I did not see in previous notes that it was changed. Have submitted the RX to pharmacy.

## 2019-01-17 NOTE — Telephone Encounter (Signed)
New Message  Denise [name] from [place] is calling in to clarify how the furosemide should be taken. On the prescription it says "Take 1 tablet (40 mg)..." Please give Denise [name] a call back to clarify.

## 2019-01-17 NOTE — Telephone Encounter (Signed)
Follow Up:     Son said he is now sending all pt's medicine to [facility] in [place] He needs pt's Isosorbide and Furosemide called in. He said the last prescription for Furosemide was wrong. The new prescription  Is 1 tablet 1  time a day.  He said you are unable to get him on the phone, send him a text and he will call you right back. He have bad  Telephone service where he lives.

## 2019-01-31 NOTE — Telephone Encounter (Signed)
Patient son aware we received results.

## 2019-02-28 ENCOUNTER — Ambulatory Visit (INDEPENDENT_AMBULATORY_CARE_PROVIDER_SITE_OTHER): Payer: Medicare Other | Admitting: *Deleted

## 2019-02-28 DIAGNOSIS — I442 Atrioventricular block, complete: Secondary | ICD-10-CM | POA: Diagnosis not present

## 2019-02-28 LAB — CUP PACEART REMOTE DEVICE CHECK
Battery Remaining Longevity: 130 mo
Battery Remaining Percentage: 95.5 %
Battery Voltage: 3.02 V
Brady Statistic RV Percent Paced: 97 %
Date Time Interrogation Session: 20201215020012
Implantable Lead Implant Date: 20060313
Implantable Lead Implant Date: 20060313
Implantable Lead Location: 753859
Implantable Lead Location: 753860
Implantable Pulse Generator Implant Date: 20190604
Lead Channel Impedance Value: 450 Ohm
Lead Channel Pacing Threshold Amplitude: 0.875 V
Lead Channel Pacing Threshold Pulse Width: 0.5 ms
Lead Channel Sensing Intrinsic Amplitude: 7.2 mV
Lead Channel Setting Pacing Amplitude: 1.125
Lead Channel Setting Pacing Pulse Width: 0.5 ms
Lead Channel Setting Sensing Sensitivity: 4 mV
Pulse Gen Model: 2272
Pulse Gen Serial Number: 9029685

## 2019-04-01 NOTE — Progress Notes (Signed)
PPM remote 

## 2019-05-30 ENCOUNTER — Ambulatory Visit (INDEPENDENT_AMBULATORY_CARE_PROVIDER_SITE_OTHER): Payer: Medicare Other | Admitting: *Deleted

## 2019-05-30 DIAGNOSIS — I442 Atrioventricular block, complete: Secondary | ICD-10-CM | POA: Diagnosis not present

## 2019-05-30 LAB — CUP PACEART REMOTE DEVICE CHECK
Battery Remaining Longevity: 128 mo
Battery Remaining Percentage: 95.5 %
Battery Voltage: 3.01 V
Brady Statistic RV Percent Paced: 97 %
Date Time Interrogation Session: 20210316020024
Implantable Lead Implant Date: 20060313
Implantable Lead Implant Date: 20060313
Implantable Lead Location: 753859
Implantable Lead Location: 753860
Implantable Pulse Generator Implant Date: 20190604
Lead Channel Impedance Value: 450 Ohm
Lead Channel Pacing Threshold Amplitude: 1 V
Lead Channel Pacing Threshold Pulse Width: 0.5 ms
Lead Channel Sensing Intrinsic Amplitude: 9.1 mV
Lead Channel Setting Pacing Amplitude: 1.25 V
Lead Channel Setting Pacing Pulse Width: 0.5 ms
Lead Channel Setting Sensing Sensitivity: 4 mV
Pulse Gen Model: 2272
Pulse Gen Serial Number: 9029685

## 2019-05-31 NOTE — Progress Notes (Signed)
PPM Remote  

## 2019-07-04 ENCOUNTER — Other Ambulatory Visit: Payer: Self-pay

## 2019-07-04 ENCOUNTER — Ambulatory Visit (INDEPENDENT_AMBULATORY_CARE_PROVIDER_SITE_OTHER): Payer: Medicare Other | Admitting: Cardiovascular Disease

## 2019-07-04 ENCOUNTER — Encounter (INDEPENDENT_AMBULATORY_CARE_PROVIDER_SITE_OTHER): Payer: Self-pay

## 2019-07-04 ENCOUNTER — Encounter: Payer: Self-pay | Admitting: Cardiovascular Disease

## 2019-07-04 DIAGNOSIS — R600 Localized edema: Secondary | ICD-10-CM

## 2019-07-04 DIAGNOSIS — E876 Hypokalemia: Secondary | ICD-10-CM

## 2019-07-04 DIAGNOSIS — Z7901 Long term (current) use of anticoagulants: Secondary | ICD-10-CM

## 2019-07-04 DIAGNOSIS — I442 Atrioventricular block, complete: Secondary | ICD-10-CM

## 2019-07-04 DIAGNOSIS — E785 Hyperlipidemia, unspecified: Secondary | ICD-10-CM

## 2019-07-04 DIAGNOSIS — I4821 Permanent atrial fibrillation: Secondary | ICD-10-CM

## 2019-07-04 DIAGNOSIS — I5042 Chronic combined systolic (congestive) and diastolic (congestive) heart failure: Secondary | ICD-10-CM | POA: Diagnosis not present

## 2019-07-04 DIAGNOSIS — Z950 Presence of cardiac pacemaker: Secondary | ICD-10-CM

## 2019-07-04 DIAGNOSIS — I10 Essential (primary) hypertension: Secondary | ICD-10-CM | POA: Diagnosis not present

## 2019-07-04 DIAGNOSIS — D649 Anemia, unspecified: Secondary | ICD-10-CM

## 2019-07-04 NOTE — Progress Notes (Signed)
Patient ID: Denise [name], female   DOB: Jul 23, 1923, 86 y.o.   MRN: [id number]    HPI: Denise [name] is a 86 y.o. female who presents to the office today for a 6 month followup cardiology evaluation.   [name] has a history of permanent atrial fibrillation and is status post AV node ablation by Dr. Mitzi Davenport and permanent pacemaker implantation in June 2006. She has a history of hypertensive cardiomyopathy. She has documented severe LA dilatation. On an echo in December 2012 ejection fraction was 45-50%. She had moderate to severe MR, moderate pulmonary hypertension a nodular sclerosis of the aortic valve the  She has a dual-chamber St. Jude identity permanent pacemaker. She is followed by Dr. Sallyanne Kuster for her pacemaker. Pacemaker interrogation revealed normal function, and no reprogramming changes were necessary.  She is pacemaker dependent and no escape rhythm was present.  When the pacemaker was taken down to 30 bpm.  She had excellent lead parameters and no high ventricular rates were recorded.  She was last evaluated by him on 07/09/2015.  In March 2014 she broke her right hip and her right arm and required surgery by Dr. Lorre Nick. After being hospitalized she went to a nursing facility .  In 2017 an echo Doppler study showed an EF of 45-50% with a questionable region of possible septal and inferior hypokinesis.  There was moderately severe tricuspid regurgitation.  There is moderate bony hypertension with PA pressure 56 mm.  She sees Dr. Sallyanne Kuster for pacemaker.  She is programmed in VVIR since she is now on permanent atrial fibrillation.  There was 96% ventricular pacing with an underlying escape rhythm at 34 bpm.  Her St. Jude identity pacemaker has only 6 months of estimated longevity.  Since I last saw her in September 2018 she denies any episodes of chest pain or shortness of breath.  She saw Dr. Sallyanne Kuster 02/17/2017 for follow-up evaluation.  She has chronic combined systolic and diastolic  heart failure near Heart Association class II.  She has permanent atrial fibrillation.  She has pacing-induced dyssynchrony but does not appear to need CRRT.  She denied any dyspnea.  She has underlying complete heart block and in the past had a reasonable reliable escape rhythm, but there is been no escape rhythm in the last 2 office evaluation device checks.  She is pacemaker dependent following her AV node ablation.    She underwent a follow-up echo Doppler study on 05/03/2017.  EF is 50-55%.  There was mild aortic sclerosis.  There was moderate mitral annular calcification with mild MR, there was severe biatrial enlargement.  There was moderate tricuspid regurgitation.    She underwent replacement of her dual-chamber [facility] Jude identity pacemaker with an surety MRI device in June 2019 by Dr. Sallyanne Kuster.  This was programmed to VVIR due to her underlying permanent atrial fibrillation.  She is pacemaker dependent.  Her new device is amenable to remote monitoring but she lives in an area where there is very poor cellular signal.  She denies any episodes of chest pain.  At times she has noticed some vertigo symptoms.  She continues to be on Coumadin for anticoagulation.  She denies PND orthopnea.  She denies bleeding.  She has a history of previous documentation of a Hollenhorst plaque and has been on statin therapy.   I saw her in September 2019 at which time her blood pressure was stable.  Her ECG showed ventricular pacing. She was on amlodipine 5 mg, HCTZ 12.5 mg,  ramipril 5 mg in addition to Toprol-XL 50 mg daily.  She continued to be on atorvastatin with target LDL less than 70.  Since I last saw her she was hospitalized from April 3 -7, 2020 with increased lower extremity edema and abdominal swelling.  BNP was greater than 300. There was minimal troponin elevation at her outside hospital felt to be due to demand ischemia.  D-dimer was 3280 at an outside hospital and lower extremity Doppler studies were  negative for DVT.  She is anticoagulated with Eliquis  in light of her permanent atrial fibrillation status post AV node ablation and is permanent pacemaker dependent and had V paced rhythm on telemetry with controlled rate.  Following her hospital discharge, your health care group from [facility] was involved in her postoperative care.    I last evaluated her in a telemedicine visit on December 16, 2018.  At that time she was feeling well and denied any leg swelling.  Presently, she has been feeling well.  She denies any leg swelling.  According to her daughter, Denise [name], she does have some abdominal bloating.  She believes she may have increased gas.  She denies any episodes of chest pain.  She has permanent atrial fibrillation.  She gets periodic device checks.  She has not had any recent laboratory since April.  She is now on Lasix 40 mg on Monday Wednesday and Friday, HCTZ 25 mg daily in addition to Eliquis at a reduced dose of 2.5 mg.    Since her last evaluation, she has been living at home.  She recently had developed increasing lower extremity edema and her Lasix dose was increased to 80 mg twice a day for 5 days and she currently is taking this now at 40 mg daily.  She has lost approximately 12 pounds.  She walks with a walker.  She has undergone recent laboratory on April 16 by her primary physician which showed a sodium 136 potassium 3.3 BUN 27 creatinine 1.0 glucose 140.  GFR was 43.4.  She has been taking supplemental potassium 20 mg twice a day.  He denies any chest pain.  She has continued to be on amlodipine 5 mg, furosemide, isosorbide 30 mg daily in addition to metoprolol succinate 50 mg and ramipril 5 mg for hypertension.  She undergoes remote pacemaker checks and is followed by Dr. Sallyanne Kuster for her pacemaker.  Denies bleeding on Eliquis.  She presents for reevaluation.  Past Medical History:  Diagnosis Date  . CHF (congestive heart failure) ([place])   . Diabetes mellitus  without complication ([place])   . Hypertension   . LVH (left ventricular hypertrophy)   . Mitral regurgitation    mod to severe  . Permanent atrial fibrillation ([place])   . Presence of permanent cardiac pacemaker   . Pulmonary hypertension ([facility])    severe  . Thyroid disease   . Tricuspid regurgitation    severe    Past Surgical History:  Procedure Laterality Date  . ABDOMINAL HYSTERECTOMY    . CARDIOVERSION  5 /18/ 2000   unsuccessful  . CHOLECYSTECTOMY    . COMPRESSION HIP SCREW Right 06/06/2012   Procedure: Open Reduction Internal Fixation Right HIp;  Surgeon: Vickey Huger, MD;  Location: [place];  Service: Orthopedics;  Laterality: Right;  . ESOPHAGOGASTRODUODENOSCOPY N/A 06/09/2015   Procedure: ESOPHAGOGASTRODUODENOSCOPY (EGD);  Surgeon: Wilford Corner, MD;  Location: [facility] ENDOSCOPY;  Service: Endoscopy;  Laterality: N/A;  . knee replacement     R  .  NM MYOCAR PERF WALL MOTION  02/28/2009   No signficant ischemia  . PACEMAKER INSERTION  05/26/2004   [employer]ude  . PPM GENERATOR CHANGEOUT N/A 08/17/2017   Procedure: PPM GENERATOR CHANGEOUT;  Surgeon: Sanda Klein, MD;  Location: [place] [facility];  Service: Cardiovascular;  Laterality: N/A;  . THYROIDECTOMY    . TUBAL LIGATION    . US ECHOCARDIOGRAPHY  02/25/2011   mod to severe MR,LA severe dilated,mild annular ca+,mod. TR,mod PI,mod ca+ of the aortic valve leaflet    Allergies  Allergen Reactions  . Iohexol Rash and Other (See Comments)     Desc: VOMITING-VERIFIED ON 05-24-04 PRE-PROCEDURE/ ARS   . Shrimp [Shellfish Allergy] Nausea And Vomiting and Rash    Current Outpatient Medications  Medication Sig Dispense Refill  . amLODipine (NORVASC) 5 MG tablet TAKE 1 TABLET BY MOUTH EVERY MORNING 90 tablet 1  . apixaban (ELIQUIS) 5 MG TABS tablet Take 1 tablet (5 mg total) by mouth 2 (two) times daily. (Patient taking differently: Take 2.5 mg by mouth 2 (two) times daily. ) 180 tablet 2  . atorvastatin (LIPITOR) 10 MG tablet TAKE 1  TABLET (10 MG TOTAL) BY MOUTH DAILY. 90 tablet 1  . furosemide (LASIX) 40 MG tablet Take one tablet ('40mg'$ ) by mouth daily 60 tablet 2  . hydrochlorothiazide (HYDRODIURIL) 25 MG tablet Take 0.5 tablets (12.5 mg total) by mouth every morning. (Patient taking differently: Take 25 mg by mouth every morning. ) 90 tablet 1  . isosorbide mononitrate (IMDUR) 30 MG 24 hr tablet TAKE 1 TABLET (30 MG TOTAL) BY MOUTH EVERY MORNING. 90 tablet 2  . methimazole (TAPAZOLE) 10 MG tablet TAKE 1/2 TABLET BY MOUTH ONCE DAILY 45 tablet 1  . metoprolol succinate (TOPROL-XL) 50 MG 24 hr tablet Take 1 tablet (50 mg total) by mouth daily. Take with or immediately following a meal. 90 tablet 1  . pantoprazole (PROTONIX) 40 MG tablet Take 40 mg by mouth daily.    . potassium chloride SA (K-DUR,KLOR-CON) 20 MEQ tablet Take 1-2 tablets (20-40 mEq total) by mouth daily as directed. 180 tablet 3  . ramipril (ALTACE) 5 MG capsule TAKE 1 CAPSULE (5 MG TOTAL) BY MOUTH DAILY. 90 capsule 2   No current facility-administered medications for this visit.    Socially she is widowed and has 4 children 4 grandchildren 4 great-grandchildren. There is no tobacco or alcohol use.  ROS General: Negative; No fevers, chills, or night sweats;  HEENT: Negative; No changes in vision or hearing, sinus congestion, difficulty swallowing Pulmonary: Negative; No cough, wheezing, shortness of breath, hemoptysis Cardiovascular: Negative; No chest pain, presyncope, syncope, palpitations  lower extremity edema GI: Negative; No nausea, vomiting, diarrhea, or abdominal pain GU: Negative; No dysuria, hematuria, or difficulty voiding Musculoskeletal: Negative; no myalgias, joint pain, or weakness Hematologic/Oncology: Negative; no easy bruising, bleeding Endocrine: Positive for diabetes mellitus and thyroid disease. Neuro: Negative; no changes in balance, headaches Skin: Negative; No rashes or skin lesions Psychiatric: Negative; No behavioral problems,  depression Sleep: Negative; No snoring, daytime sleepiness, hypersomnolence, bruxism, restless legs, hypnogognic hallucinations, no cataplexy Other comprehensive 14 point system review is negative.  PE BP (!) 124/52 (BP Location: Left Arm, Patient Position: Sitting, Cuff Size: Normal)   Pulse 74   Ht 5' (1.524 m)   Wt 153 lb (69.4 kg)   BMI 29.88 kg/m    Repeat blood pressure by me 124/62  Wt Readings from Last 3 Encounters:  07/04/19 153 lb (69.4 kg)  12/16/18 146 lb (  66.2 kg)  08/02/18 148 lb 3.2 oz (67.2 kg)   General: Alert, oriented, no distress.  Skin: normal turgor, no rashes, warm and dry HEENT: Normocephalic, atraumatic. Pupils equal round and reactive to light; sclera anicteric; extraocular muscles intact;  Nose without nasal septal hypertrophy Mouth/Parynx benign; Mallinpatti scale 3 Neck: No JVD, no carotid bruits; normal carotid upstroke Lungs: clear to ausculatation and percussion; no wheezing or rales Chest wall: without tenderness to palpitation Heart: PMI not displaced, RRR, s1 s2 normal, 2/6 systolic murmur, no diastolic murmur, no rubs, gallops, thrills, or heaves Abdomen: soft, nontender; no hepatosplenomehaly, BS+; abdominal aorta nontender and not dilated by palpation. Back: no CVA tenderness Pulses 2+ Musculoskeletal: full range of motion, normal strength, no joint deformities Extremities: no clubbing cyanosis or edema, Homan's sign negative  Neurologic: grossly nonfocal; Cranial nerves grossly wnl Psychologic: Normal mood and affect    ECG (independently read by me): Ventricular paced rhythm at 74 bpm.  Occasional PVCs in a trigeminal pattern  September 2019 ECG (independently read by me): Ventricular paced rhythm at 70 bpm.  September 2018  ECG (independently read by me): ventricular paced rhythm at 72 bpm with a PVC.  QTc interval 508 ms.  QRS interval 176 ms.  March 2018 ECG (independently read by me): Underlying atrial fibrillation with  ventricular paced rhythm at 70 bpm.  On 100% paced  September 2017 ECG (independently read by me): V paced rhythm at 72 bpm.  One PVC.  January 2017 ECG (independently read by me):  V paced rhythm at 70 bpm with underlying atrial fibrillation.  August 2016 ECG (independently read by me): 100% ventricular paced rhythm at 70 bpm with underlying atrial fibrillation.  August 2015 ECG (independently read by me): Underlying atrial fibrillation with ventricular paced rhythm at 70 beats per minute with 100% capture.  LABS BMP Latest Ref Rng & Units 06/21/2018 06/20/2018 06/20/2018  Glucose 70 - 99 mg/dL 98 - 108(H)  BUN 8 - 23 mg/dL 26(H) - 25(H)  Creatinine 0.44 - 1.00 mg/dL 1.04(H) - 1.28(H)  BUN/Creat Ratio 12 - 28 - - -  Sodium 135 - 145 mmol/L 133(L) - 136  Potassium 3.5 - 5.1 mmol/L 4.7 3.4(L) 2.8(L)  Chloride 98 - 111 mmol/L 99 - 94(L)  CO2 22 - 32 mmol/L 25 - 30  Calcium 8.9 - 10.3 mg/dL 8.5(L) - 8.3(L)   Hepatic Function Latest Ref Rng & Units 05/11/2017 11/20/2015 06/07/2015  Total Protein 6.0 - 8.5 g/dL 7.4 7.2 7.7  Albumin 3.2 - 4.6 g/dL 4.2 4.3 4.3  AST 0 - 40 IU/L 19 38(H) 25  ALT 0 - 32 IU/L 20 33(H) 21  Alk Phosphatase 39 - 117 IU/L 123(H) 108 82  Total Bilirubin 0.0 - 1.2 mg/dL 1.3(H) 1.3(H) 1.7(H)   CBC Latest Ref Rng & Units 06/20/2018 06/19/2018 06/18/2018  WBC 4.0 - 10.5 K/uL 8.0 6.2 6.2  Hemoglobin 12.0 - 15.0 g/dL 11.2(L) 11.2(L) 11.2(L)  Hematocrit 36.0 - 46.0 % 34.8(L) 36.5 34.5(L)  Platelets 150 - 400 K/uL 245 272 273   Lab Results  Component Value Date   MCV 87.0 06/20/2018   MCV 87.5 06/19/2018   MCV 87.3 06/18/2018   Lab Results  Component Value Date   TSH 3.620 06/19/2018   Lab Results  Component Value Date   HGBA1C 6.5 (H) 06/07/2015    Lipid Panel  No results found for: CHOL, TRIG, HDL, CHOLHDL, VLDL, LDLCALC, LDLDIRECT   INR today checked in office: 2.0  RADIOLOGY: No results  found.  IMPRESSION:  1. Chronic combined systolic and diastolic heart  failure ([place])   2. Essential hypertension   3. Permanent atrial fibrillation ([place])   4. Lower extremity edema   5. Hypokalemia   6. Pacemaker   7. Long term current use of anticoagulant therapy   8. Hyperlipidemia with target LDL less than 70   9. Anemia, unspecified type     ASSESSMENT AND PLAN: Ms. Erlandson is a 86 year old female who has a history of permanent atrial fibrillation and is status post AV node ablation and previously had a [employer]. Jude identity dual-chamber pacemaker programmed to VVIR mode in light of her atrial fibrillation. She was hospitalized in March 2017. An Echo Doppler study at that time showed an EF of 45-50%. A follow-up echo Doppler study on 04/28/2017  showed an EF of 50-55%.  There was mild aortic valve stenosis with trivial AR.  There was moderate mitral annular calcification with mild MR.  There was severe biatrial enlargement. Her pacemaker approached end-of-life and she underwent replacement with an Essure T MRI device in June 2019 programmed to VVIR due to her permanent atrial fibrillation.  She is pacemaker dependent.  She is now on Eliquis instead of her previous warfarin.  Her blood pressure today is stable on her multiple regimen consisting of amlodipine 5 mg furosemide 40 mg isosorbide 30 mg metoprolol succinate 50 mg in addition to [employer].  She had recently developed progressive lower extremity edema and her primary physician titrated Lasix such that she received 80 mg twice daily for 5 days and is now back to her previous dose.  This resulted in a weight loss of 12 pounds from 165 pounds down to 153 pounds.  Following her diuresis, most recent laboratory has shown a potassium of 3.3 despite her taking 40 mEq of potassium daily.  I have suggested for the next 2 days she increase her potassium supplementation and add an additional 20 mEq 2 days with plans for follow-up be met in 1 week.  She has stage III chronic kidney disease with GFR 43.4.  She is mildly anemic  with most recent hemoglobin 10.6 and hematocrit 33.6.  She is on atorvastatin for hyperlipidemia.  She is tolerating apixaban anticoagulation.  GERD is controlled with pantoprazole.  She will be seeing Dr. Sallyanne Kuster in 3 months for pacemaker evaluation.  I will see her in 6 months for follow-up evaluation.   Troy Sine, MD, [facility]  07/06/2019 7:11 PM

## 2019-07-04 NOTE — Patient Instructions (Addendum)
Medication Instructions:  TODAY AND TOMORROW, TAKE AN EXTRA DOSE OF YOUR POTASSIUM AND HAVE FOLLOW UP LABS DRAWN WITH YOUR PCP  *If you need a refill on your cardiac medications before your next appointment, please call your pharmacy*  Follow-Up: At [facility], you and your health needs are our priority.  As part of our continuing mission to provide you with exceptional heart care, we have created designated Provider Care Teams.  These Care Teams include your primary Cardiologist (physician) and Advanced Practice Providers (APPs -  Physician Assistants and Nurse Practitioners) who all work together to provide you with the care you need, when you need it.  We recommend signing up for the patient portal called "MyChart".  Sign up information is provided on this After Visit Summary.  MyChart is used to connect with patients for Virtual Visits (Telemedicine).  Patients are able to view lab/test results, encounter notes, upcoming appointments, etc.  Non-urgent messages can be sent to your provider as well.   To learn more about what you can do with MyChart, go to [email or web address].    Your next appointment:   3 month(s)  The format for your next appointment:   In Person  Provider:   Thurmon Fair, MD   Other Instructions FOLLOW UP WITH DR.KELLY 3 MONTHS AFTER YOUR APPT WITH DR.CROITORU

## 2019-07-06 ENCOUNTER — Encounter: Payer: Self-pay | Admitting: Cardiovascular Disease

## 2019-08-29 ENCOUNTER — Ambulatory Visit: Payer: Medicare Other

## 2019-09-11 ENCOUNTER — Telehealth: Payer: Self-pay | Admitting: Cardiovascular Disease

## 2019-09-11 NOTE — Telephone Encounter (Signed)
Spoke to [place] ([facility]), states he has had difficulty getting patients home remote box connecting d/t phone service. States he has called [employer] and is working with him about options. Advised him that when patient comes in for next check to discuss options about transmission vs coming into device clinic for checks. States they live a good ways. Advised him to discuss this at next apt. With Dr. Royann Shivers and see what he recommends. Agrees and verbalizes understanding.

## 2019-09-11 NOTE — Telephone Encounter (Signed)
New Message: ° ° ° ° °Please call, question about her device. °

## 2019-09-18 LAB — CUP PACEART REMOTE DEVICE CHECK
Battery Remaining Longevity: 128 mo
Battery Remaining Percentage: 95.5 %
Battery Voltage: 3.01 V
Brady Statistic RV Percent Paced: 97 %
Date Time Interrogation Session: 20210615034722
Implantable Lead Implant Date: 20060313
Implantable Lead Implant Date: 20060313
Implantable Lead Location: 753859
Implantable Lead Location: 753860
Implantable Pulse Generator Implant Date: 20190604
Lead Channel Impedance Value: 450 Ohm
Lead Channel Pacing Threshold Amplitude: 0.875 V
Lead Channel Pacing Threshold Pulse Width: 0.5 ms
Lead Channel Sensing Intrinsic Amplitude: 8.8 mV
Lead Channel Setting Pacing Amplitude: 1.125
Lead Channel Setting Pacing Pulse Width: 0.5 ms
Lead Channel Setting Sensing Sensitivity: 4 mV
Pulse Gen Model: 2272
Pulse Gen Serial Number: 9029685

## 2019-09-25 ENCOUNTER — Other Ambulatory Visit: Payer: Self-pay

## 2019-09-25 ENCOUNTER — Ambulatory Visit (INDEPENDENT_AMBULATORY_CARE_PROVIDER_SITE_OTHER): Payer: Medicare Other | Admitting: Cardiovascular Disease

## 2019-09-25 ENCOUNTER — Encounter: Payer: Self-pay | Admitting: Cardiovascular Disease

## 2019-09-25 VITALS — BP 109/59 | HR 70 | Wt 164.2 lb

## 2019-09-25 DIAGNOSIS — I5042 Chronic combined systolic (congestive) and diastolic (congestive) heart failure: Secondary | ICD-10-CM | POA: Diagnosis not present

## 2019-09-25 DIAGNOSIS — I4821 Permanent atrial fibrillation: Secondary | ICD-10-CM

## 2019-09-25 DIAGNOSIS — E669 Obesity, unspecified: Secondary | ICD-10-CM

## 2019-09-25 DIAGNOSIS — I10 Essential (primary) hypertension: Secondary | ICD-10-CM

## 2019-09-25 DIAGNOSIS — Z7901 Long term (current) use of anticoagulants: Secondary | ICD-10-CM | POA: Diagnosis not present

## 2019-09-25 DIAGNOSIS — Z950 Presence of cardiac pacemaker: Secondary | ICD-10-CM

## 2019-09-25 DIAGNOSIS — I442 Atrioventricular block, complete: Secondary | ICD-10-CM | POA: Diagnosis not present

## 2019-09-25 DIAGNOSIS — E782 Mixed hyperlipidemia: Secondary | ICD-10-CM

## 2019-09-25 DIAGNOSIS — E1169 Type 2 diabetes mellitus with other specified complication: Secondary | ICD-10-CM

## 2019-09-25 MED ORDER — POTASSIUM CHLORIDE CRYS ER 20 MEQ PO TBCR: EXTENDED_RELEASE_TABLET | ORAL | 3 refills | Status: DC

## 2019-09-25 MED ORDER — FUROSEMIDE 40 MG PO TABS: ORAL_TABLET | ORAL | 3 refills | Status: DC

## 2019-09-25 NOTE — Patient Instructions (Signed)
Medication Instructions:  FUROSEMIDE: Take 40 mg daily except on Tuesday and Friday, take 80 mg (2 tablets)  POTASSIUM: Take 20 mEq daily except on Tuesday and Friday take 40 mEq (2 tablets) daily.  *If you need a refill on your cardiac medications before your next appointment, please call your pharmacy*   Lab Work: None ordered If you have labs (blood work) drawn today and your tests are completely normal, you will receive your results only by: [name] MyChart Message (if you have MyChart) OR . A paper copy in the mail If you have any lab test that is abnormal or we need to change your treatment, we will call you to review the results.   Testing/Procedures: None ordered   Follow-Up: At [facility], you and your health needs are our priority.  As part of our continuing mission to provide you with exceptional heart care, we have created designated Provider Care Teams.  These Care Teams include your primary Cardiologist (physician) and Advanced Practice Providers (APPs -  Physician Assistants and Nurse Practitioners) who all work together to provide you with the care you need, when you need it.  We recommend signing up for the patient portal called "MyChart".  Sign up information is provided on this After Visit Summary.  MyChart is used to connect with patients for Virtual Visits (Telemedicine).  Patients are able to view lab/test results, encounter notes, upcoming appointments, etc.  Non-urgent messages can be sent to your provider as well.   To learn more about what you can do with MyChart, go to [email or web address].    Your next appointment:   12 month(s)  The format for your next appointment:   In Person  Provider:   Thurmon Fair, MD

## 2019-09-25 NOTE — Progress Notes (Signed)
Patient ID: Denise [name], female   DOB: Jul 20, 1923, 86 y.o.   MRN: [id number]    Cardiology Office Note    Date:  09/25/2019   ID:  Denise [name], DOB 09/09/1923, MRN [id number]  PCP:  Daryl Eastern, MD  Cardiologist:  Nicki Guadalajara, MD;  Thurmon Fair, MD   Chief Complaint  Patient presents with  . Congestive Heart Failure  . Pacemaker Check    History of Present Illness:  Denise [name] is a 86 y.o. female with complete heart block after AV node ablation for difficult to control atrial fibrillation, chronic combined systolic diastolic heart failure who presents in follow-up.  She has a dual-chamber [employer]. Jude Identity device that was implanted in 2006 and this was replaced with an Assurity MRI device in June 2019, programmed VVIR for permanent atrial fibrillation.  She is pacemaker dependent.  The biggest complaint is of worsening generalized weakness.  She has 1-2+ pedal edema, always a little worse in the left foot.  Her memory is deteriorating.  She denies dyspnea, angina, palpitations, syncope, claudication or focal neurological events or bleeding.  Her son was recommended to consider hospice care for Denise [name], but he is reluctant.  Echocardiogram March 2017 showed an ejection fraction of 45-50%, mild mitral regurgitation, moderate to severe tricuspid regurgitation with an estimated systolic PA pressure 56 mmHg. Her echo in 2012 had described moderate to severe mitral regurgitation.  Repeat echo Apr 28 2017  - Left ventricle: Wall thickness was increased in a pattern of mild LVH. Systolic function was normal. The estimated ejection fraction was in the range of 50% to 55%. The study is not technically sufficient to allow evaluation of LV diastolic function.  Aortic valve: There was mild stenosis. There was trivial regurgitation. - Mitral valve: Moderately calcified annulus. Moderately thickened,   mildly calcified leaflets . There was mild regurgitation. - Left  atrium: The atrium was severely dilated. - Right ventricle: The cavity size was mildly dilated. - Right atrium: The atrium was severely dilated. - Atrial septum: No defect or patent foramen ovale was identified. - Tricuspid valve: There was moderate regurgitation.   Past Medical History:  Diagnosis Date  . CHF (congestive heart failure) (HCC)   . Diabetes mellitus without complication (HCC)   . Hypertension   . LVH (left ventricular hypertrophy)   . Mitral regurgitation    mod to severe  . Permanent atrial fibrillation (HCC)   . Presence of permanent cardiac pacemaker   . Pulmonary hypertension (HCC)    severe  . Thyroid disease   . Tricuspid regurgitation    severe    Past Surgical History:  Procedure Laterality Date  . ABDOMINAL HYSTERECTOMY    . CARDIOVERSION  5 /18/ 2000   unsuccessful  . CHOLECYSTECTOMY    . COMPRESSION HIP SCREW Right 06/06/2012   Procedure: Open Reduction Internal Fixation Right HIp;  Surgeon: Dannielle Huh, MD;  Location: [facility] OR;  Service: Orthopedics;  Laterality: Right;  . ESOPHAGOGASTRODUODENOSCOPY N/A 06/09/2015   Procedure: ESOPHAGOGASTRODUODENOSCOPY (EGD);  Surgeon: Charlott Rakes, MD;  Location: [facility] ENDOSCOPY;  Service: Endoscopy;  Laterality: N/A;  . knee replacement     R  . NM MYOCAR PERF WALL MOTION  02/28/2009   No signficant ischemia  . PACEMAKER INSERTION  05/26/2004   [employer].Jude  . PPM GENERATOR CHANGEOUT N/A 08/17/2017   Procedure: PPM GENERATOR CHANGEOUT;  Surgeon: Thurmon Fair, MD;  Location: [facility] INVASIVE CV LAB;  Service: Cardiovascular;  Laterality: N/A;  .  THYROIDECTOMY    . TUBAL LIGATION    . US ECHOCARDIOGRAPHY  02/25/2011   mod to severe MR,LA severe dilated,mild annular ca+,mod. TR,mod PI,mod ca+ of the aortic valve leaflet    Current Medications: Outpatient Medications Prior to Visit  Medication Sig Dispense Refill  . amLODipine (NORVASC) 5 MG tablet TAKE 1 TABLET BY MOUTH EVERY MORNING 90 tablet 1  . apixaban (ELIQUIS)  5 MG TABS tablet Take 1 tablet (5 mg total) by mouth 2 (two) times daily. (Patient taking differently: Take 2.5 mg by mouth 2 (two) times daily. ) 180 tablet 2  . atorvastatin (LIPITOR) 10 MG tablet TAKE 1 TABLET (10 MG TOTAL) BY MOUTH DAILY. 90 tablet 1  . hydrochlorothiazide (HYDRODIURIL) 25 MG tablet Take 0.5 tablets (12.5 mg total) by mouth every morning. (Patient taking differently: Take 25 mg by mouth every morning. ) 90 tablet 1  . isosorbide mononitrate (IMDUR) 30 MG 24 hr tablet TAKE 1 TABLET (30 MG TOTAL) BY MOUTH EVERY MORNING. 90 tablet 2  . methimazole (TAPAZOLE) 10 MG tablet TAKE 1/2 TABLET BY MOUTH ONCE DAILY 45 tablet 1  . metoprolol succinate (TOPROL-XL) 50 MG 24 hr tablet Take 1 tablet (50 mg total) by mouth daily. Take with or immediately following a meal. 90 tablet 1  . pantoprazole (PROTONIX) 40 MG tablet Take 40 mg by mouth daily.    . ramipril (ALTACE) 5 MG capsule TAKE 1 CAPSULE (5 MG TOTAL) BY MOUTH DAILY. 90 capsule 2  . furosemide (LASIX) 40 MG tablet Take one tablet (40mg ) by mouth daily 60 tablet 2  . potassium chloride SA (K-DUR,KLOR-CON) 20 MEQ tablet Take 1-2 tablets (20-40 mEq total) by mouth daily as directed. 180 tablet 3   No facility-administered medications prior to visit.     Allergies:   Iohexol and Shrimp [shellfish allergy]   Social History   Socioeconomic History  . Marital status: Widowed    Spouse name: Not on file  . Number of children: Not on file  . Years of education: Not on file  . Highest education level: Not on file  Occupational History  . Not on file  Tobacco Use  . Smoking status: Never Smoker  . Smokeless tobacco: Never Used  Substance and Sexual Activity  . Alcohol use: No  . Drug use: No  . Sexual activity: Never  Other Topics Concern  . Not on file  Social History Narrative  . Not on file   Social Determinants of Health   Financial Resource Strain:   . Difficulty of Paying Living Expenses:   Food Insecurity:   .  Worried About in the Last Year:   . Programme researcher, broadcasting/film/video in the Last Year:   Transportation Needs:   . Barista (Medical):   Freight forwarder Lack of Transportation (Non-Medical):   Physical Activity:   . Days of Exercise per Week:   . Minutes of Exercise per Session:   Stress:   . Feeling of Stress :   Social Connections:   . Frequency of Communication with Friends and Family:   . Frequency of Social Gatherings with Friends and Family:   . Attends Religious Services:   . Active Member of Clubs or Organizations:   . Attends [name] Meetings:   Banker Marital Status:      Family History:  The patient's family history includes Asthma in her mother; Cancer - Colon in her brother; Gallbladder disease in her maternal grandmother; Heart  attack in her brother and father; Heart disease in her child; Stroke in her maternal grandfather and sister.   ROS:   Please see the history of present illness.    ROS All other systems are reviewed and are negative.  PHYSICAL EXAM:   VS:  BP (!) 109/59   Pulse 70   Wt 164 lb 3.2 oz (74.5 kg)   SpO2 98%   BMI 32.07 kg/m     General: Alert, oriented x3, no distress, mildly obese.  Healthy subclavian pacemaker site Head: no evidence of trauma, PERRL, EOMI, no exophtalmos or lid lag, no myxedema, no xanthelasma; normal ears, nose and oropharynx Neck: 6-7 cm elevation in jugular venous pulsations and moderate hepatojugular reflux; brisk carotid pulses without delay and no carotid bruits Chest: clear to auscultation, no signs of consolidation by percussion or palpation, normal fremitus, symmetrical and full respiratory excursions Cardiovascular: normal position and quality of the apical impulse, regular rhythm, normal first and second heart sounds, 2/6 holosystolic murmur heard best at the lower left sternal border, no diastolic murmurs, rubs or gallops Abdomen: no tenderness or distention, no masses by palpation, no abnormal  pulsatility or arterial bruits, normal bowel sounds, no hepatosplenomegaly Extremities: no clubbing, cyanosis; 1+ right pedal edema, 2+ left pedal edema; 2+ radial, ulnar and brachial pulses bilaterally; 2+ right femoral, posterior tibial and dorsalis pedis pulses; 2+ left femoral, posterior tibial and dorsalis pedis pulses; no subclavian or femoral bruits Neurological: grossly nonfocal Psych: Normal mood and affect   Wt Readings from Last 3 Encounters:  09/25/19 164 lb 3.2 oz (74.5 kg)  07/04/19 153 lb (69.4 kg)  12/16/18 146 lb (66.2 kg)     Studies/Labs Reviewed:   EKG:  EKG is not ordered today.  Previous tracing from July 05, 2019 shows atrial fibrillation with 100% ventricular paced rhythm  ASSESSMENT:    1. Chronic combined systolic and diastolic heart failure (HCC)   2. Permanent atrial fibrillation (HCC)   3. Long term current use of anticoagulant therapy   4. CHB (complete heart block) (HCC)   5. Essential hypertension   6. Diabetes mellitus type 2 in obese (HCC)   7. Mixed hyperlipidemia   8. Pacemaker      PLAN:  In order of problems listed above:  1. CHF: Appears to be a little more hypervolemic today with jugular venous distention and lower extremity edema.  Increase the diuretic dose slightly by taking a double dose 2 days of the week. 2. Permanent atrial fibrillation s/p AV node ablation.  CHADSVasc 5 (age 87, gender, HF, HTN, DM). On anticoagulation. 3. Warfarin: No bleeding complications. 4. CHB: Pacemaker dependent 5. HTN: Well-controlled. 6. DM: controlled without medications 7. HLP: On statin 8. Pacemaker: Normal device function.  Recent generator change out.   Medication Adjustments/Labs and Tests Ordered: Current medicines are reviewed at length with the patient today.  Concerns regarding medicines are outlined above.  Medication changes, Labs and Tests ordered today are listed in the Patient Instructions below. Patient Instructions  Medication  Instructions:  FUROSEMIDE: Take 40 mg daily except on Tuesday and Friday, take 80 mg (2 tablets)  POTASSIUM: Take 20 mEq daily except on Tuesday and Friday take 40 mEq (2 tablets) daily.  *If you need a refill on your cardiac medications before your next appointment, please call your pharmacy*   Lab Work: None ordered If you have labs (blood work) drawn today and your tests are completely normal, you will receive your results only by: [name]  MyChart Message (if you have MyChart) OR . A paper copy in the mail If you have any lab test that is abnormal or we need to change your treatment, we will call you to review the results.   Testing/Procedures: None ordered   Follow-Up: At [facility], you and your health needs are our priority.  As part of our continuing mission to provide you with exceptional heart care, we have created designated Provider Care Teams.  These Care Teams include your primary Cardiologist (physician) and Advanced Practice Providers (APPs -  Physician Assistants and Nurse Practitioners) who all work together to provide you with the care you need, when you need it.  We recommend signing up for the patient portal called "MyChart".  Sign up information is provided on this After Visit Summary.  MyChart is used to connect with patients for Virtual Visits (Telemedicine).  Patients are able to view lab/test results, encounter notes, upcoming appointments, etc.  Non-urgent messages can be sent to your provider as well.   To learn more about what you can do with MyChart, go to [email or web address].    Your next appointment:   12 month(s)  The format for your next appointment:   In Person  Provider:   Thurmon Fair, MD       Signed, Thurmon Fair, MD  09/25/2019 2:01 PM    [facility] [address] [place], [place], [place]  [address] Phone: [phone]; Fax: [phone]

## 2019-10-05 ENCOUNTER — Other Ambulatory Visit: Payer: Self-pay | Admitting: Cardiovascular Disease

## 2019-10-05 NOTE — Telephone Encounter (Signed)
*  STAT* If patient is at the pharmacy, call can be transferred to refill team.   1. Which medications need to be refilled? (please list name of each medication and dose if known)? potassium chloride SA (KLOR-CON) 20 MEQ tablet  2. Which pharmacy/location (including street and city if local pharmacy) is medication to be sent to? [facility] - [place], [place] - [address]  3. Do they need a 30 day or 90 day supply? 90 days

## 2019-10-19 ENCOUNTER — Telehealth: Payer: Self-pay | Admitting: Cardiovascular Disease

## 2019-10-19 DIAGNOSIS — I5042 Chronic combined systolic (congestive) and diastolic (congestive) heart failure: Secondary | ICD-10-CM

## 2019-10-19 DIAGNOSIS — I4821 Permanent atrial fibrillation: Secondary | ICD-10-CM

## 2019-10-19 MED ORDER — POTASSIUM CHLORIDE CRYS ER 20 MEQ PO TBCR: EXTENDED_RELEASE_TABLET | ORAL | 0 refills | Status: DC

## 2019-10-19 NOTE — Telephone Encounter (Signed)
Patients son called back to clinic with questions about the blood work ordered for his mother. He stated that he had tried to make her an appointment at a local hospital near him in [place], and they had stated that they didn't have an order for her blood work and that her last potassium level was 3.7 in April of this year. He stated that they told him her potassium level was in an acceptable range and was fine. This nurse informed him that her potassium level needs to be checked due to the amount of potassium she has been taking and that it is very important to watch potassium level in regards to the heart. Patient was very adamant about the fact that he was giving her the appropriate dose of potassium and he thought it wasn't fair that we were checking on things now. He also insisted that he wanted 3 month prescription of potassium instead of the 30 day prescription that was refilled today.Patient's son stated he feels like we are holding a 86 year old woman's medicine ransom during a pandemic.He was informed that no medication was being with held was and was made aware this was due to the fact that her potassium level needs to be checked and her dose tailored appropriately per Dr. Salena Saner. Patients son made aware that the blood work should be completed at [facility] and that the order was in and all he needed to do was take her to a local [facility] and no appointment was needed. This nurse provided him with the address to the [place] location so that this can be completed. He stated that he wants her to have her lab work done at her PCP's office because he wants her to be checked out there, and stated that if they couldn't get her in within the next couple of weeks then he would take her to [facility] to have the BMP drawn. Advised patients son to let us know if there is anything we can help with.

## 2019-10-19 NOTE — Telephone Encounter (Signed)
Returned the call to the patient's son. He never picked up the new prescription for the "potassium 20 mEq daily except on Tuesday and Friday take 40 mEq" that was prescribed at the patient's appointment in July. Therefore he was giving the patient 40 mEq daily except 60 mEq on Tuesdays and Fridays that was the old prescription.   Per Dr. Royann Shivers, the patient should take the amount that he was currently giving her until the patient can have the BMET completed. After the BMET, the dosage can be revaluated. A one month supply has been sent in.

## 2019-10-19 NOTE — Telephone Encounter (Signed)
Thanks, will tailor dose based on the next set of labs

## 2019-10-19 NOTE — Telephone Encounter (Signed)
    Pt c/o medication issue:  1. Name of Medication: potassium chloride SA (KLOR-CON) 20 MEQ tablet  2. How are you currently taking this medication (dosage and times per day)? Take 20 mEq daily except on Tuesday and Friday take 40 mEg (2 tablets)  3. Are you having a reaction (difficulty breathing--STAT)?   4. What is your medication issue? Marylene Land from [facility] called, she would like to clarify script for this med. She said according to pt she was instructed to increased 2 times a day and some days she will have to take more.

## 2019-10-19 NOTE — Telephone Encounter (Signed)
Follow up   Pt's son is calling back, he said he have some questions about blood work

## 2019-10-19 NOTE — Telephone Encounter (Signed)
Marylene Land from [facility] contacted office for clarification on patients potassium prescription. Patients son Fayrene Fearing had contacted pharmacy stating that she takes a different dosage. Per patients med orders it states to take of potassium daily except on Tuesday and Friday take to cover extra dose of lasix ordered on those days. Pharmacy has the current up to date order from 7/12 office visit. Patients son Fayrene Fearing contacted to clarify how much potassium patient has been currently taking. Fayrene Fearing stated that he gives her 2 tablets a day ( ) and gives her 3 tablets on Tuesday and Friday which is 60(meq) on those days. DOD ([facility]) notified for advice.  MD recommends patient continue taking current dosage and have BMET lab work when able to see where potassium level is currently.   Attempted to recall patients son Fayrene Fearing in regards to MD recommendations but he did not answer. Unable to leave voicemail at this time as well.   Will forward this message to MD for further guidance and follow up.

## 2019-10-20 NOTE — Telephone Encounter (Signed)
Call placed to the patient's son. Offered to get a home nurse to go to the patient's house to draw the BMET. He stated that she had an appointment next Wednesday with her PCP to get the labs drawn. Once again, he stated that he has been giving her 40 mEq of potassium daily with an extra 20 mEq on Tuesdays and Friday. He will continue this until after the labs have been completed and then this may need to be reevaluated.

## 2019-10-26 NOTE — Telephone Encounter (Signed)
Current potassium dose is appropriate.  Please continue on same meds. Mild anemia, stable from April.  She is on Eliquis. Please recheck CBC and basic metabolic panel in 3 months.

## 2019-10-26 NOTE — Telephone Encounter (Signed)
Attempted to reach the patient's son. Unable to leave a message.

## 2019-10-26 NOTE — Addendum Note (Signed)
Addended by: Sandi Mariscal on: 10/26/2019 05:20 PM   Modules accepted: Orders

## 2019-10-27 NOTE — Telephone Encounter (Signed)
Returned the call to the son. He stated that the patient will be seen at the PCP office from now on and she will prescribe all the medications. He stated that it is getting too hard to bring the patient out to the multiple appointments.   Dr. Royann Shivers has been made aware.

## 2019-11-28 ENCOUNTER — Ambulatory Visit (INDEPENDENT_AMBULATORY_CARE_PROVIDER_SITE_OTHER): Payer: Medicare Other | Admitting: *Deleted

## 2019-11-28 DIAGNOSIS — R001 Bradycardia, unspecified: Secondary | ICD-10-CM

## 2019-11-28 LAB — CUP PACEART REMOTE DEVICE CHECK
Battery Remaining Longevity: 127 mo
Battery Remaining Percentage: 95.5 %
Battery Voltage: 3.01 V
Brady Statistic RV Percent Paced: 96 %
Date Time Interrogation Session: 20210914022703
Implantable Lead Implant Date: 20060313
Implantable Lead Implant Date: 20060313
Implantable Lead Location: 753859
Implantable Lead Location: 753860
Implantable Pulse Generator Implant Date: 20190604
Lead Channel Impedance Value: 450 Ohm
Lead Channel Pacing Threshold Amplitude: 1.125 V
Lead Channel Pacing Threshold Pulse Width: 0.5 ms
Lead Channel Sensing Intrinsic Amplitude: 9.2 mV
Lead Channel Setting Pacing Amplitude: 1.375
Lead Channel Setting Pacing Pulse Width: 0.5 ms
Lead Channel Setting Sensing Sensitivity: 4 mV
Pulse Gen Model: 2272
Pulse Gen Serial Number: 9029685

## 2019-11-30 NOTE — Progress Notes (Signed)
Remote pacemaker transmission.   

## 2019-12-13 IMAGING — US ULTRASOUND ABDOMEN LIMITED
1 series · 8 of 8 positions shown · non-contrast
Comparison: None.

CLINICAL DATA: CHF.  Ascites.

EXAM:
LIMITED ABDOMEN ULTRASOUND FOR ASCITES
TECHNIQUE: Limited ultrasound survey for ascites was performed in all four
abdominal quadrants.

[Series 1: ultrasound abdomen limited · 8 of 8 slices shown]
[im 1/8]
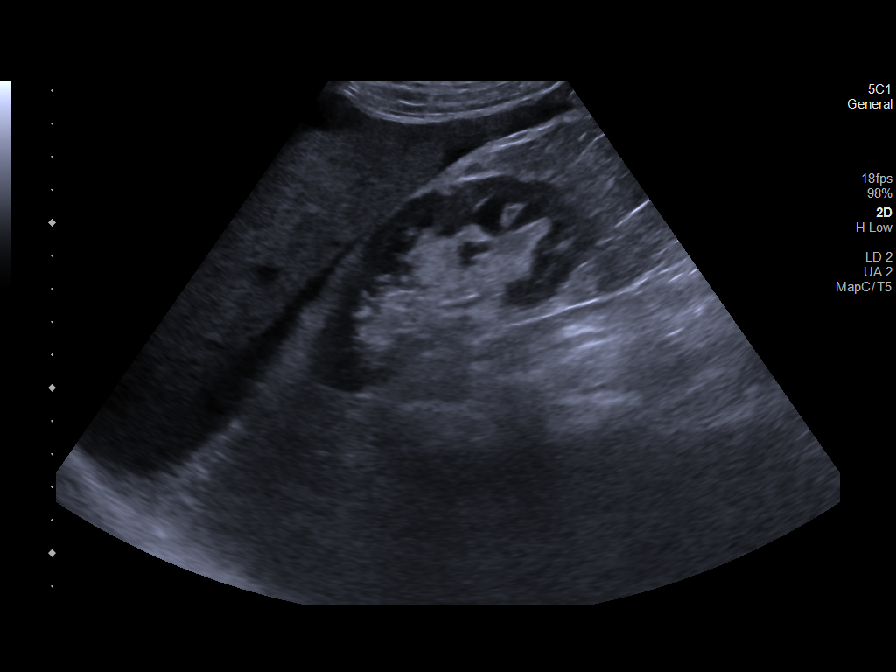
[im 2/8]
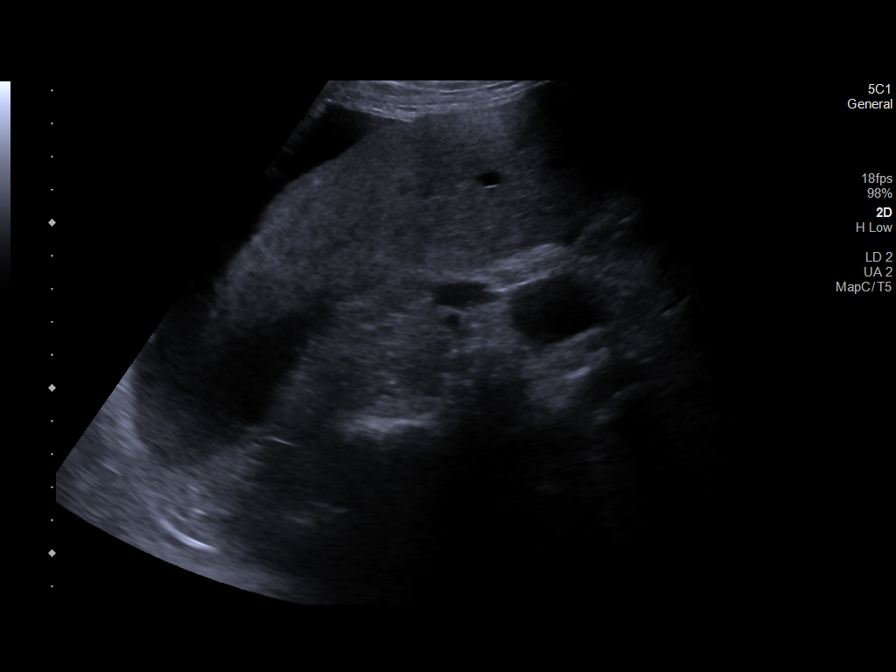
[im 3/8]
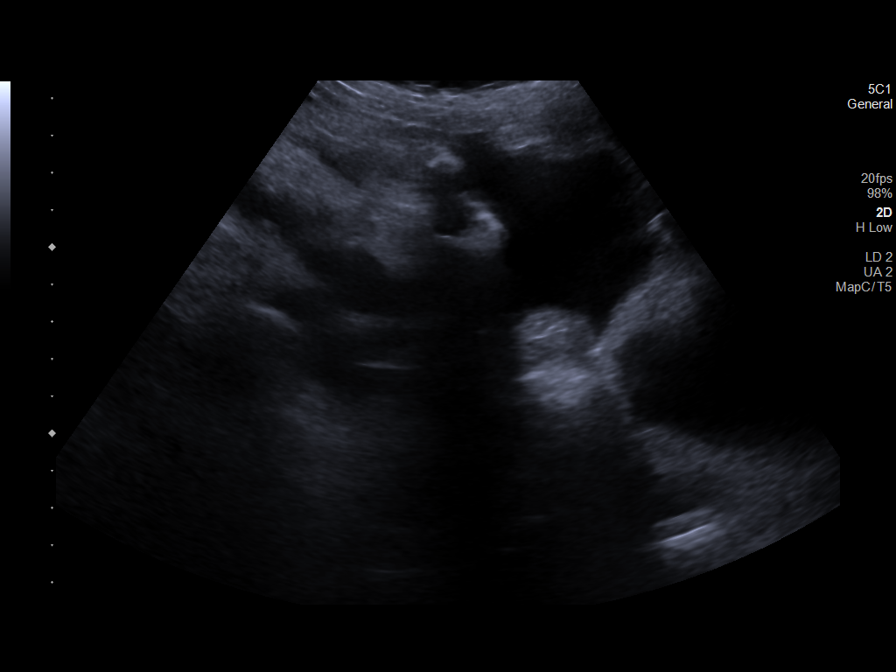
[im 4/8]
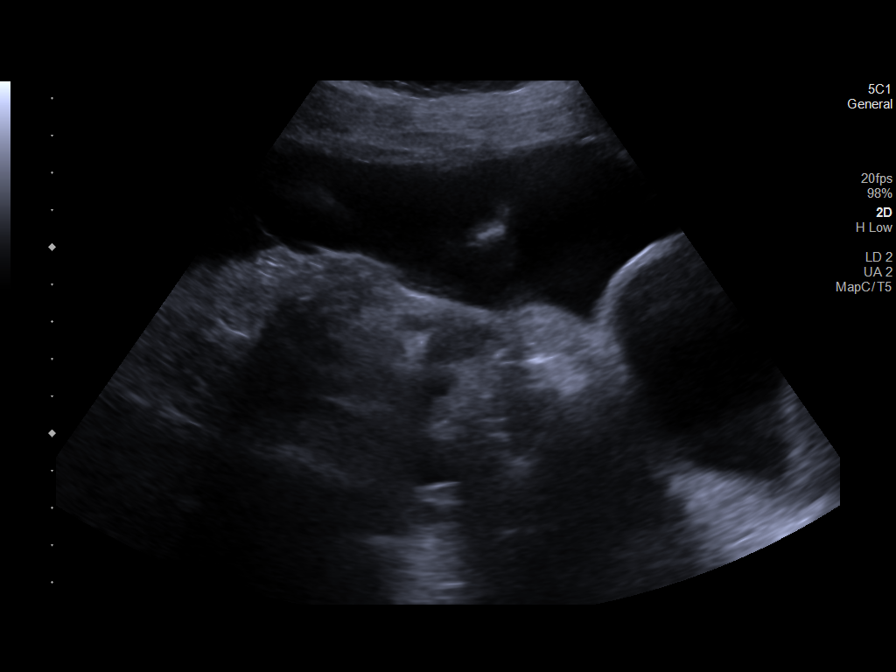
[im 5/8]
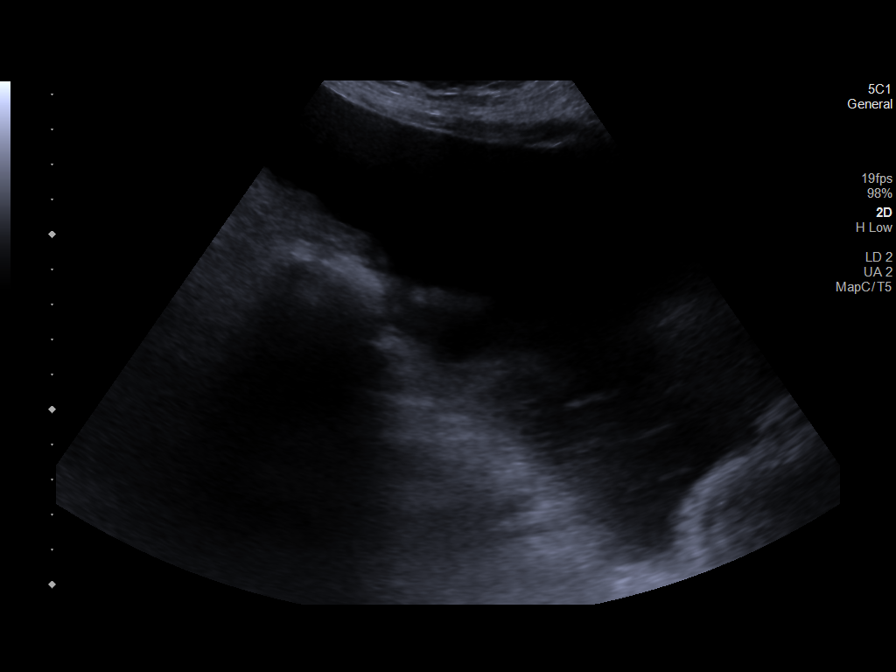
[im 6/8]
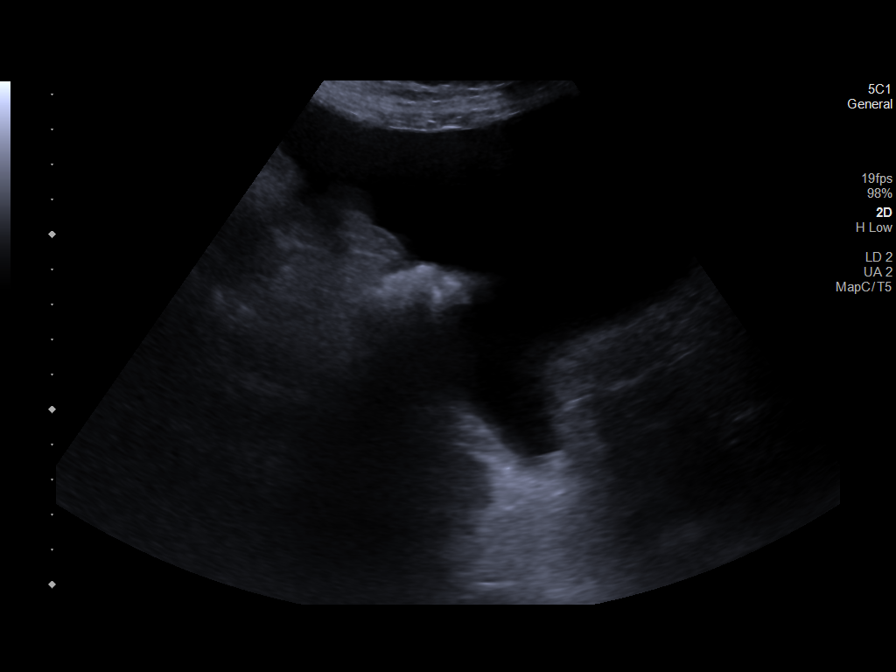
[im 7/8]
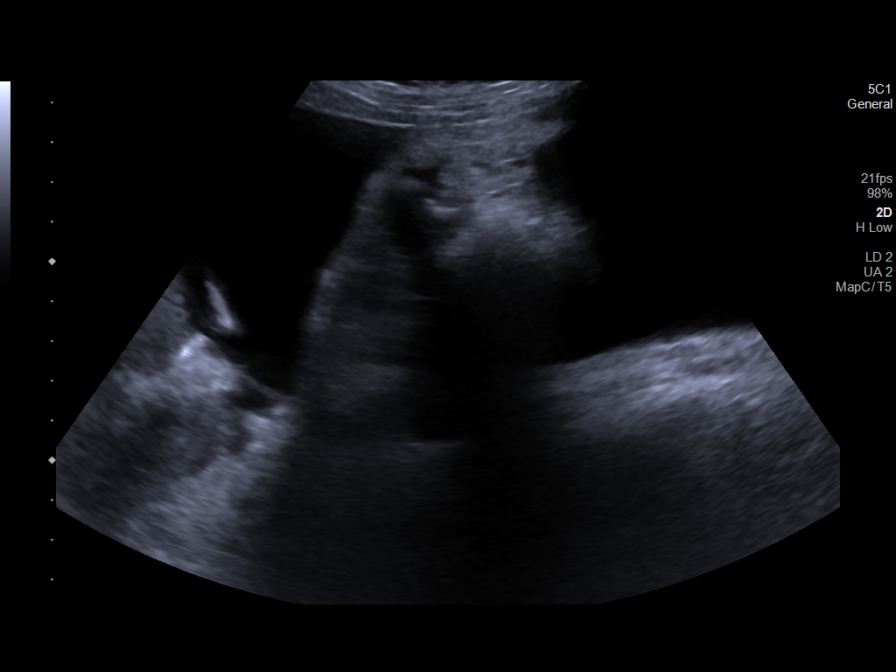
[im 8/8]
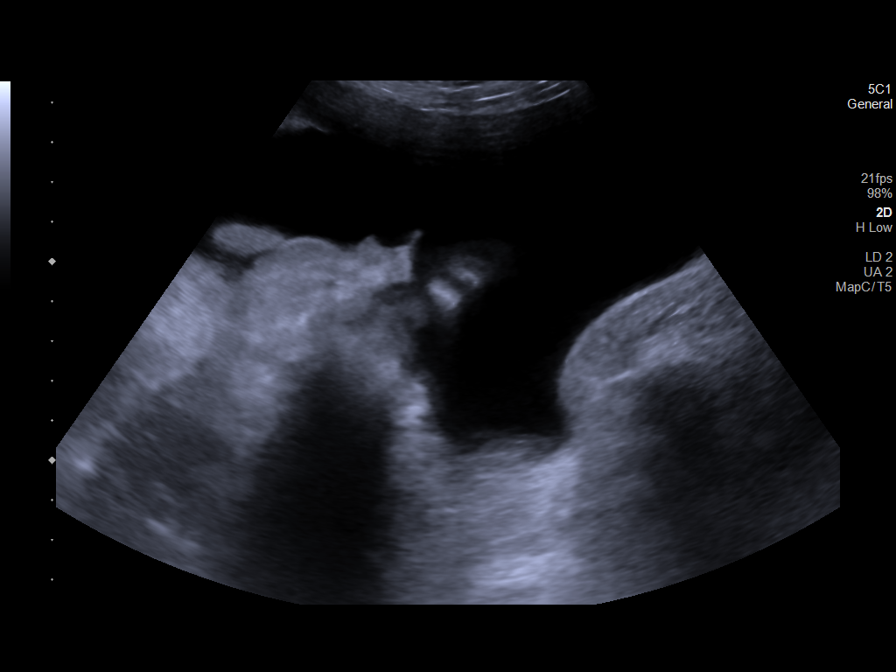

[8 of 8 positions shown; findings below may reference images not displayed]

FINDINGS: Moderate ascites in all 4 quadrants. There is more site is on the
left than the right, probably due to positioning.
IMPRESSION: Moderate ascites.

## 2019-12-14 IMAGING — CR CHEST - 2 VIEW
2 series · 2 of 2 positions shown · non-contrast
Comparison: 06/07/2012

CLINICAL DATA: CHF.  Ascites.

EXAM:
CHEST - 2 VIEW

[chest ap]
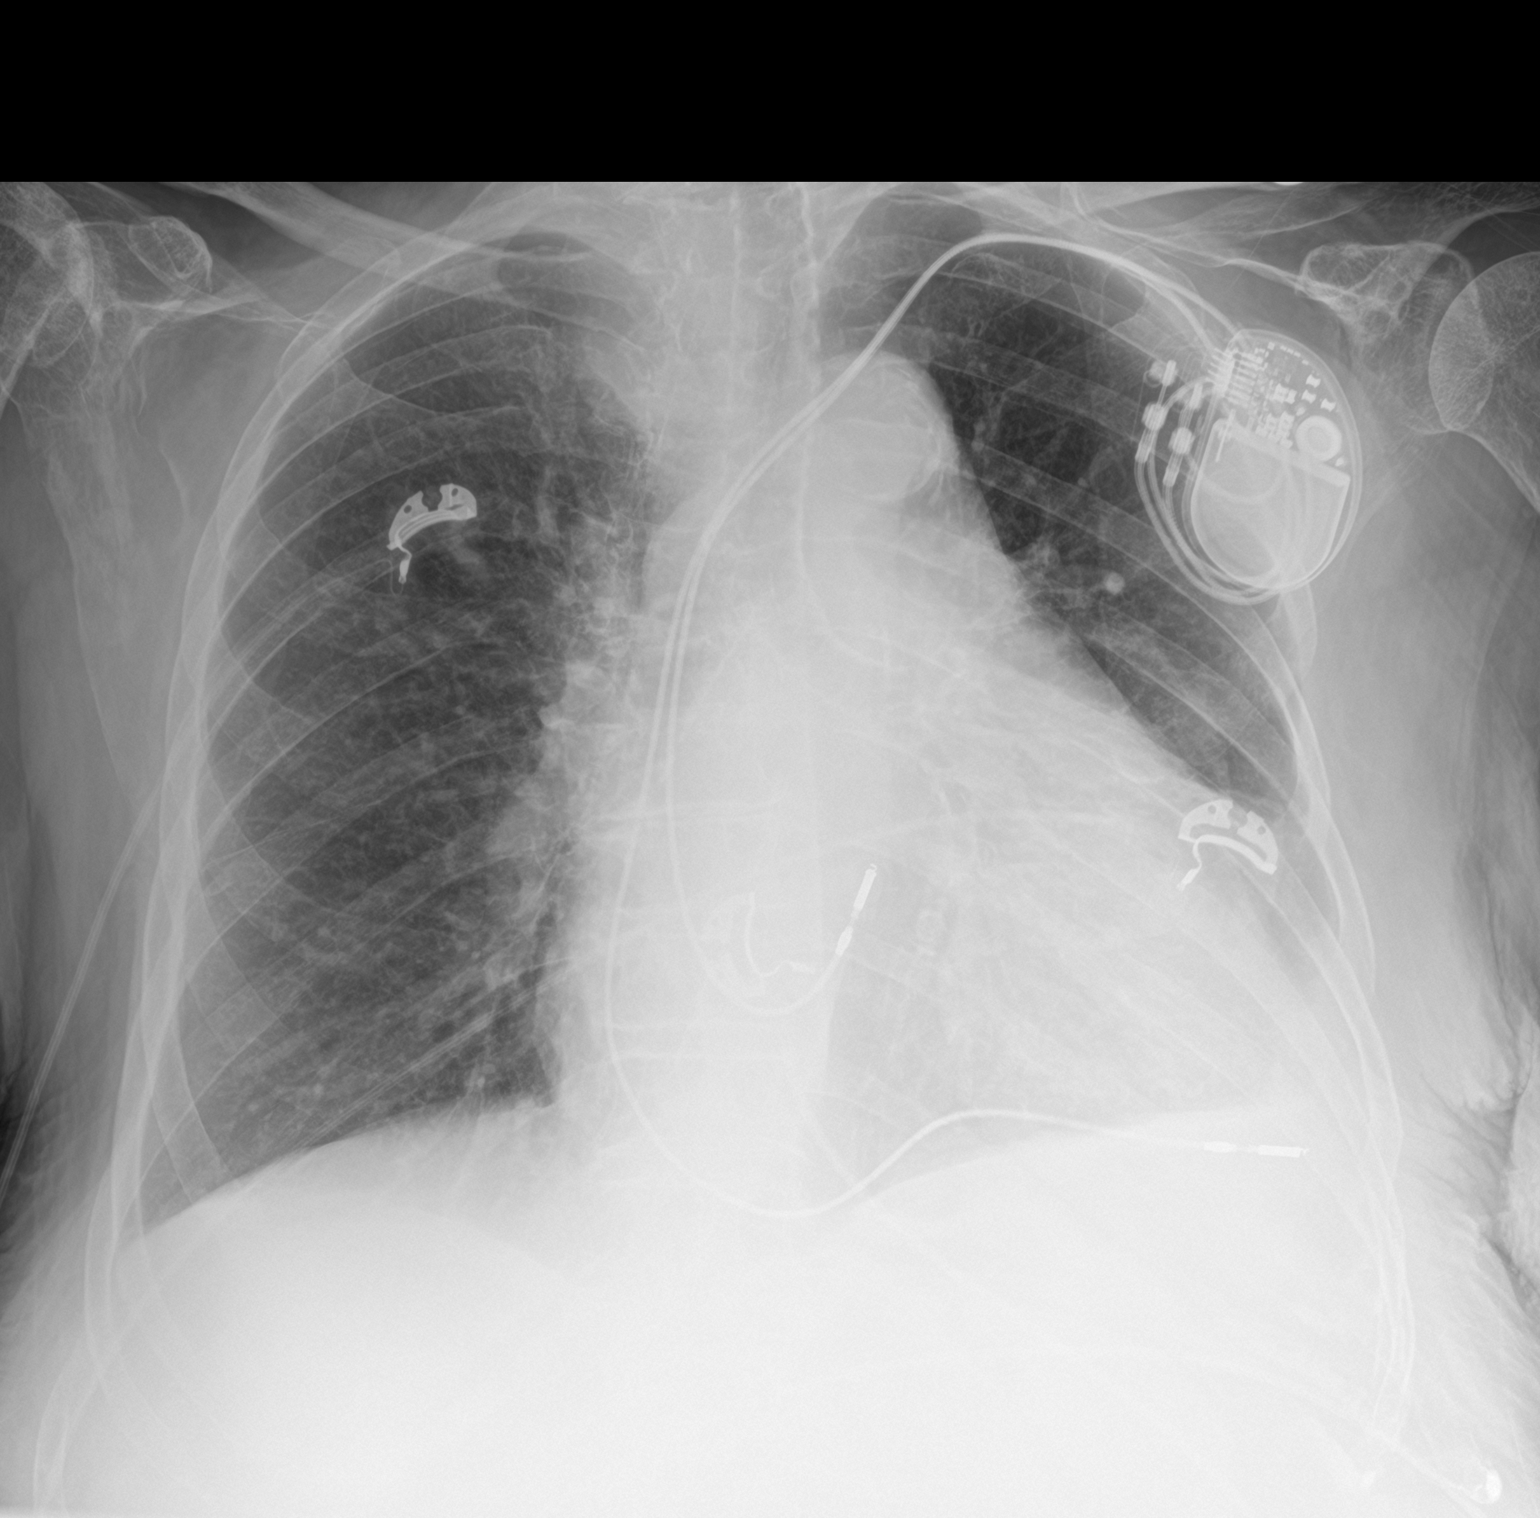

[chest lat]
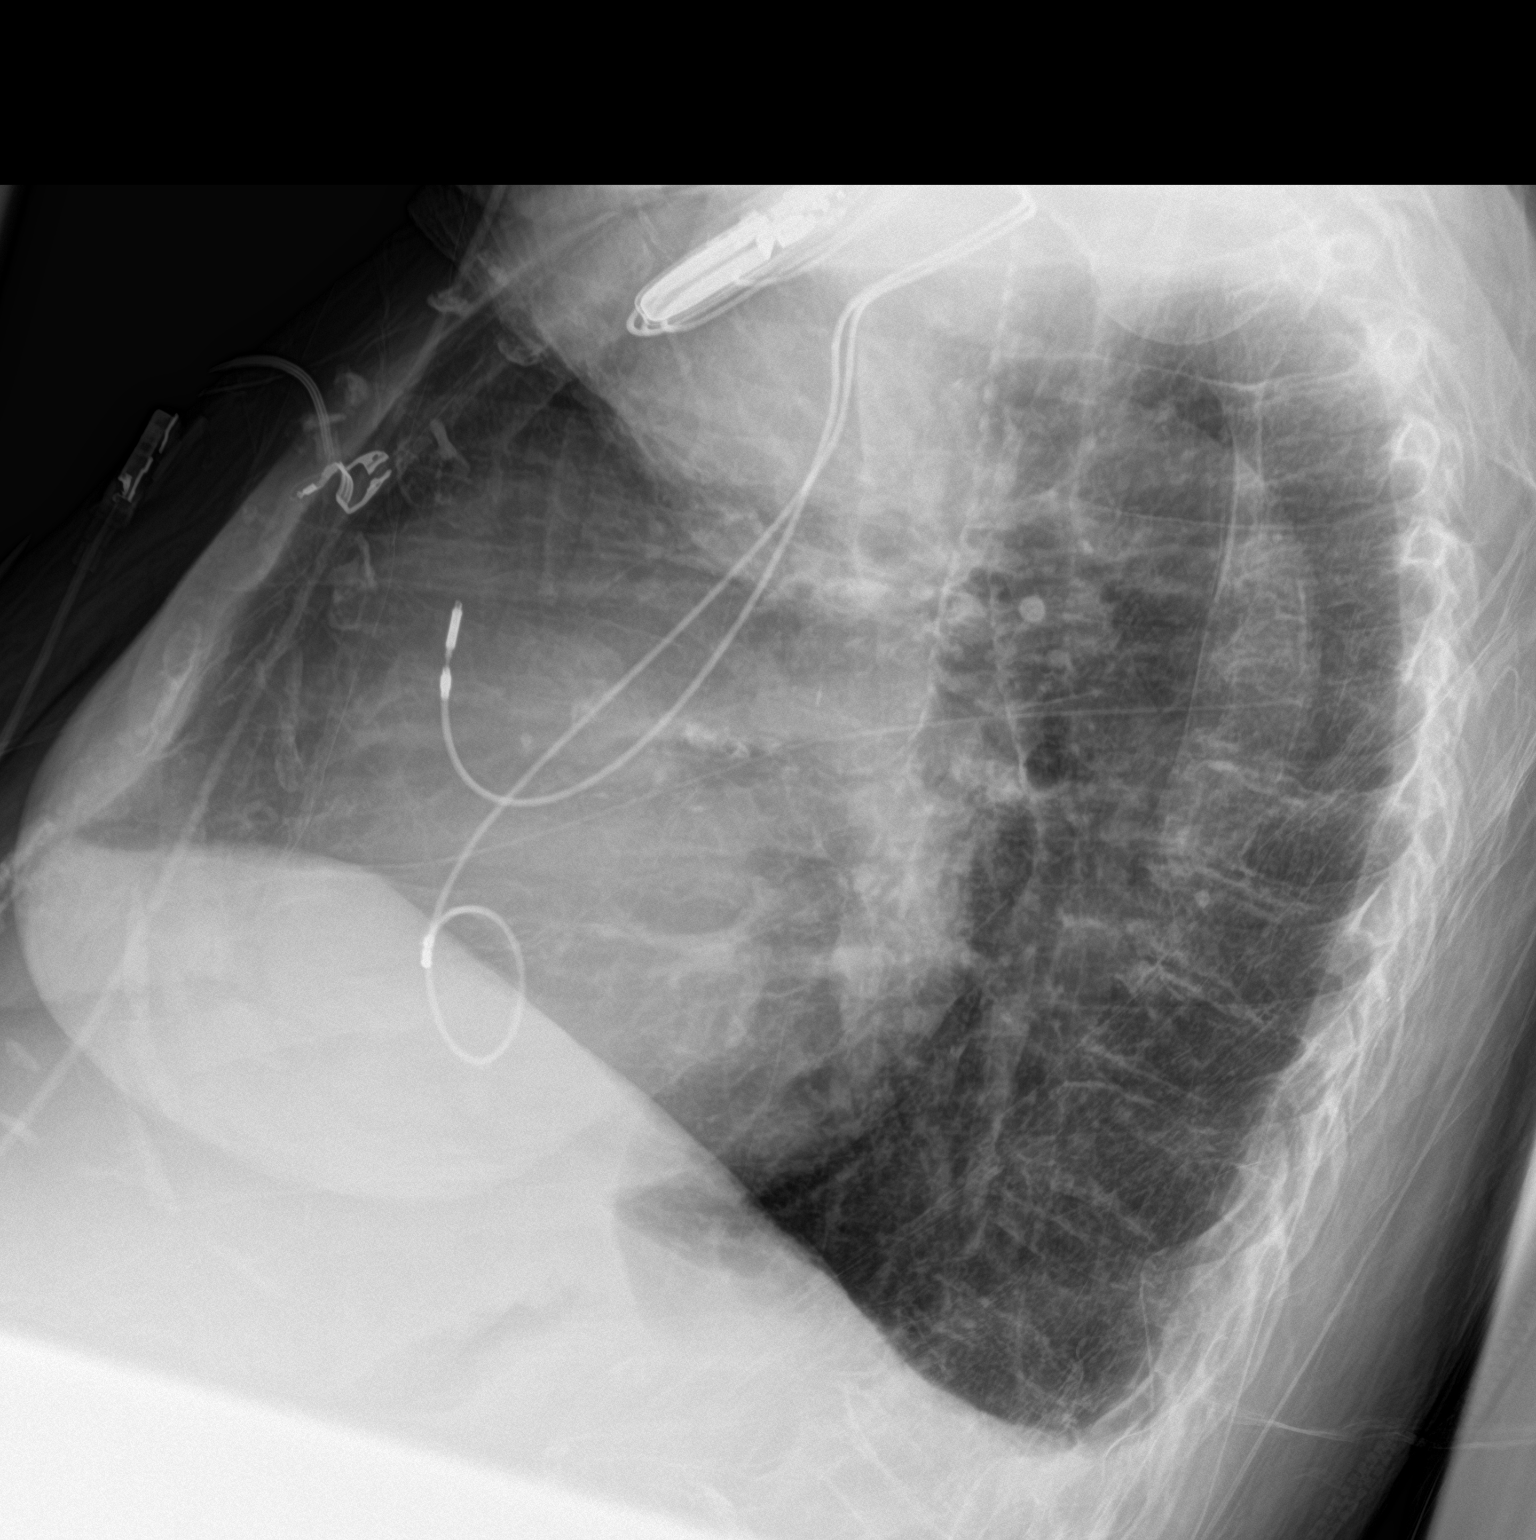

[2 of 2 positions shown; findings below may reference images not displayed]

FINDINGS: Cardiac silhouette is mildly enlarged. Left anterior chest wall
sequential pacemaker is stable. No mediastinal or hilar masses. No
evidence of adenopathy.

Lungs are clear.

Minimal pleural effusions.  No pneumothorax.

Skeletal structures are demineralized but grossly intact.
IMPRESSION: 1. No acute cardiopulmonary disease. No evidence of congestive heart
failure.
2. Mild stable cardiomegaly.  Minimal pleural effusions.

## 2019-12-26 ENCOUNTER — Other Ambulatory Visit: Payer: Self-pay | Admitting: Cardiovascular Disease

## 2019-12-26 ENCOUNTER — Other Ambulatory Visit: Payer: Self-pay

## 2019-12-26 MED ORDER — ATORVASTATIN CALCIUM 10 MG PO TABS: ORAL_TABLET | ORAL | 1 refills | Status: DC

## 2020-01-11 ENCOUNTER — Other Ambulatory Visit: Payer: Self-pay

## 2020-01-11 ENCOUNTER — Encounter: Payer: Self-pay | Admitting: Cardiovascular Disease

## 2020-01-11 ENCOUNTER — Ambulatory Visit (INDEPENDENT_AMBULATORY_CARE_PROVIDER_SITE_OTHER): Payer: Medicare Other | Admitting: Cardiovascular Disease

## 2020-01-11 VITALS — BP 119/55 | HR 70 | Ht 60.0 in | Wt 157.0 lb

## 2020-01-11 DIAGNOSIS — R600 Localized edema: Secondary | ICD-10-CM

## 2020-01-11 DIAGNOSIS — I4821 Permanent atrial fibrillation: Secondary | ICD-10-CM

## 2020-01-11 DIAGNOSIS — I10 Essential (primary) hypertension: Secondary | ICD-10-CM | POA: Diagnosis not present

## 2020-01-11 DIAGNOSIS — Z7901 Long term (current) use of anticoagulants: Secondary | ICD-10-CM

## 2020-01-11 DIAGNOSIS — I442 Atrioventricular block, complete: Secondary | ICD-10-CM

## 2020-01-11 DIAGNOSIS — E785 Hyperlipidemia, unspecified: Secondary | ICD-10-CM

## 2020-01-11 LAB — COMPREHENSIVE METABOLIC PANEL
ALT: 17 IU/L (ref 0–32)
AST: 29 IU/L (ref 0–40)
Albumin/Globulin Ratio: 0.8 — ABNORMAL LOW (ref 1.2–2.2)
Albumin: 3 g/dL — ABNORMAL LOW (ref 3.5–4.6)
Alkaline Phosphatase: 208 IU/L — ABNORMAL HIGH (ref 44–121)
BUN/Creatinine Ratio: 27 (ref 12–28)
BUN: 31 mg/dL (ref 10–36)
Bilirubin Total: 0.7 mg/dL (ref 0.0–1.2)
CO2: 27 mmol/L (ref 20–29)
Calcium: 8.9 mg/dL (ref 8.7–10.3)
Chloride: 95 mmol/L — ABNORMAL LOW (ref 96–106)
Creatinine, Ser: 1.16 mg/dL — ABNORMAL HIGH (ref 0.57–1.00)
GFR calc Af Amer: 47 mL/min/{1.73_m2} — ABNORMAL LOW (ref >59)
GFR calc non Af Amer: 40 mL/min/{1.73_m2} — ABNORMAL LOW (ref >59)
Globulin, Total: 3.8 g/dL (ref 1.5–4.5)
Glucose: 119 mg/dL — ABNORMAL HIGH (ref 65–99)
Potassium: 3.9 mmol/L (ref 3.5–5.2)
Sodium: 135 mmol/L (ref 134–144)
Total Protein: 6.8 g/dL (ref 6.0–8.5)

## 2020-01-11 LAB — CBC
Hematocrit: 30.9 % — ABNORMAL LOW (ref 34.0–46.6)
Hemoglobin: 9.5 g/dL — ABNORMAL LOW (ref 11.1–15.9)
MCH: 26.3 pg — ABNORMAL LOW (ref 26.6–33.0)
MCHC: 30.7 g/dL — ABNORMAL LOW (ref 31.5–35.7)
MCV: 86 fL (ref 79–97)
Platelets: 339 10*3/uL (ref 150–450)
RBC: 3.61 x10E6/uL — ABNORMAL LOW (ref 3.77–5.28)
RDW: 14.9 % (ref 11.7–15.4)
WBC: 7.4 10*3/uL (ref 3.4–10.8)

## 2020-01-11 LAB — TSH: TSH: 3.72 u[IU]/mL (ref 0.450–4.500)

## 2020-01-11 MED ORDER — AMLODIPINE BESYLATE 2.5 MG PO TABS: 2.5000 mg | ORAL_TABLET | Freq: Every morning | ORAL | 3 refills | Status: DC

## 2020-01-11 NOTE — Patient Instructions (Signed)
Medication Instructions:  DECREASE the Amlodipine to 2.5 mg once daily  *If you need a refill on your cardiac medications before your next appointment, please call your pharmacy*   Lab Work: Your provider would like for you to have the following labs today: CMET, CBC and TSH  If you have labs (blood work) drawn today and your tests are completely normal, you will receive your results only by: [name] MyChart Message (if you have MyChart) OR . A paper copy in the mail If you have any lab test that is abnormal or we need to change your treatment, we will call you to review the results.   Testing/Procedures: None ordered   Follow-Up: At [facility], you and your health needs are our priority.  As part of our continuing mission to provide you with exceptional heart care, we have created designated Provider Care Teams.  These Care Teams include your primary Cardiologist (physician) and Advanced Practice Providers (APPs -  Physician Assistants and Nurse Practitioners) who all work together to provide you with the care you need, when you need it.  We recommend signing up for the patient portal called "MyChart".  Sign up information is provided on this After Visit Summary.  MyChart is used to connect with patients for Virtual Visits (Telemedicine).  Patients are able to view lab/test results, encounter notes, upcoming appointments, etc.  Non-urgent messages can be sent to your provider as well.   To learn more about what you can do with MyChart, go to [email or web address].    Your next appointment:   6 month(s)  The format for your next appointment:   In Person  Provider:   You may see Nicki Guadalajara, MD or one of the following Advanced Practice Providers on your designated Care Team:    Azalee Course, PA-C  Micah Flesher, PA-C or   Judy Pimple, [place]

## 2020-01-11 NOTE — Progress Notes (Signed)
Patient ID: Denise [name], female   DOB: December 06, 1923, 86 y.o.   MRN: [id number]    HPI: Denise [name] is a 86 y.o. female who presents to the office today for a 6 month followup cardiology evaluation.   Denise [name] has a history of permanent atrial fibrillation and is status post AV node ablation by Dr. Mitzi Davenport and permanent pacemaker implantation in June 2006. She has a history of hypertensive cardiomyopathy. She has documented severe LA dilatation. On an echo in December 2012 ejection fraction was 45-50%. She had moderate to severe MR, moderate pulmonary hypertension a nodular sclerosis of the aortic valve the  She has a dual-chamber St. Jude identity permanent pacemaker. She is followed by Dr. Sallyanne Kuster for Denise pacemaker. Pacemaker interrogation revealed normal function, and no reprogramming changes were necessary.  She is pacemaker dependent and no escape rhythm was present.  When the pacemaker was taken down to 30 bpm.  She had excellent lead parameters and no high ventricular rates were recorded.  She was last evaluated by him on 07/09/2015.  In March 2014 she broke Denise right hip and Denise right arm and required surgery by Dr. Lorre Nick. After being hospitalized she went to a nursing facility .  In 2017 an echo Doppler study showed an EF of 45-50% with a questionable region of possible septal and inferior hypokinesis.  There was moderately severe tricuspid regurgitation.  There is moderate bony hypertension with PA pressure 56 mm.  She sees Dr. Sallyanne Kuster for pacemaker.  She is programmed in VVIR since she is now on permanent atrial fibrillation.  There was 96% ventricular pacing with an underlying escape rhythm at 34 bpm.  Denise St. Jude identity pacemaker has only 6 months of estimated longevity.  Since I last saw Denise in September 2018 she denies any episodes of chest pain or shortness of breath.  She saw Dr. Sallyanne Kuster 02/17/2017 for follow-up evaluation.  She has chronic combined systolic and diastolic  heart failure near Heart Association class II.  She has permanent atrial fibrillation.  She has pacing-induced dyssynchrony but does not appear to need CRRT.  She denied any dyspnea.  She has underlying complete heart block and in the past had a reasonable reliable escape rhythm, but there is been no escape rhythm in the last 2 office evaluation device checks.  She is pacemaker dependent following Denise AV node ablation.    She underwent a follow-up echo Doppler study on 05/03/2017.  EF is 50-55%.  There was mild aortic sclerosis.  There was moderate mitral annular calcification with mild MR, there was severe biatrial enlargement.  There was moderate tricuspid regurgitation.    She underwent replacement of Denise dual-chamber [facility] Jude identity pacemaker with an surety MRI device in June 2019 by Dr. Sallyanne Kuster.  This was programmed to VVIR due to Denise underlying permanent atrial fibrillation.  She is pacemaker dependent.  Denise new device is amenable to remote monitoring but she lives in an area where there is very poor cellular signal.  She denies any episodes of chest pain.  At times she has noticed some vertigo symptoms.  She continues to be on Coumadin for anticoagulation.  She denies PND orthopnea.  She denies bleeding.  She has a history of previous documentation of a Hollenhorst plaque and has been on statin therapy.   I saw Denise in September 2019 at which time Denise blood pressure was stable.  Denise ECG showed ventricular pacing. She was on amlodipine 5 mg, HCTZ 12.5 mg,  ramipril 5 mg in addition to Toprol-XL 50 mg daily.  She continued to be on atorvastatin with target LDL less than 70.  Since I last saw Denise she was hospitalized from April 3 -7, 2020 with increased lower extremity edema and abdominal swelling.  BNP was greater than 300. There was minimal troponin elevation at Denise outside hospital felt to be due to demand ischemia.  D-dimer was 3280 at an outside hospital and lower extremity Doppler studies were  negative for DVT.  She is anticoagulated with Eliquis  in light of Denise permanent atrial fibrillation status post AV node ablation and is permanent pacemaker dependent and had V paced rhythm on telemetry with controlled rate.  Following Denise hospital discharge, your health care group from [facility] was involved in Denise postoperative care.    I last evaluated Denise in a telemedicine visit on December 16, 2018.  At that time she was feeling well and denied any leg swelling.  Presently, she has been feeling well.  She denies any leg swelling.  According to Denise [name], Denise [name], she does have some abdominal bloating.  She believes she may have increased gas.  She denies any episodes of chest pain.  She has permanent atrial fibrillation.  She gets periodic device checks.  She has not had any recent laboratory since April.  She is now on Lasix 40 mg on Monday Wednesday and Friday, HCTZ 25 mg daily in addition to Eliquis at a reduced dose of 2.5 mg.    I last saw Denise in April 2021.  She recently had developed increasing lower extremity edema and Denise Lasix dose was increased to 80 mg twice a day for 5 days and she currently is taking this now at 40 mg daily.  She has lost approximately 12 pounds.  She walks with a walker.  She has undergone recent laboratory on April 16 by Denise primary physician which showed a sodium 136 potassium 3.3 BUN 27 creatinine 1.0 glucose 140.  GFR was 43.4.  She has been taking supplemental potassium 20 mg twice a day.  He denies any chest pain.  She has continued to be on amlodipine 5 mg, furosemide, isosorbide 30 mg daily in addition to metoprolol succinate 50 mg and ramipril 5 mg for hypertension.  She undergoes remote pacemaker checks and is followed by Dr. Sallyanne Kuster for Denise pacemaker.   She was last evaluated by Dr. Sallyanne Kuster in 21 November 2019 and Denise pacemaker had normal device function, status post recent generator change out.  She continues to have permanent underlying  atrial fibrillation status post AV node ablation and has been maintained on Eliquis anticoagulation without bleeding.  She is pacemaker dependent.  She was more hyper per bulimic on his examination and had increased lower extremity edema.  She was advised to continue to take Lasix 40 mg but on Tuesday and Friday take 60 mg daily.  Presently she is still living at home and Denise son lives with Denise and gives Denise Denise medications.  She recently has begun to notice some ankle swelling left greater than right which seems to come and go.  She denies any chest pain.  She denies PND orthopnea.  She is unaware of palpitations.  There is no presyncope or syncope.  She is here with Denise [name] and presents for evaluation.  Past Medical History:  Diagnosis Date  . CHF (congestive heart failure) ([place])   . Diabetes mellitus without complication ([place])   . Hypertension   .  LVH (left ventricular hypertrophy)   . Mitral regurgitation    mod to severe  . Permanent atrial fibrillation ([place])   . Presence of permanent cardiac pacemaker   . Pulmonary hypertension (HCC)    severe  . Thyroid disease   . Tricuspid regurgitation    severe    Past Surgical History:  Procedure Laterality Date  . ABDOMINAL HYSTERECTOMY    . CARDIOVERSION  5 /18/ 2000   unsuccessful  . CHOLECYSTECTOMY    . COMPRESSION HIP SCREW Right 06/06/2012   Procedure: Open Reduction Internal Fixation Right HIp;  Surgeon: Vickey Huger, MD;  Location: [facility];  Service: Orthopedics;  Laterality: Right;  . ESOPHAGOGASTRODUODENOSCOPY N/A 06/09/2015   Procedure: ESOPHAGOGASTRODUODENOSCOPY (EGD);  Surgeon: Wilford Corner, MD;  Location[facility] ENDOSCOPY;  Service: Endoscopy;  Laterality: N/A;  . knee replacement     R  . NM MYOCAR PERF WALL MOTION  02/28/2009   No signficant ischemia  . PACEMAKER INSERTION  05/26/2004   St.Jude  . PPM GENERATOR CHANGEOUT N/A 08/17/2017   Procedure: PPM GENERATOR CHANGEOUT;  Surgeon: Sanda Klein, MD;  Location: [facility];  Service: Cardiovascular;  Laterality: N/A;  . THYROIDECTOMY    . TUBAL LIGATION    . US ECHOCARDIOGRAPHY  02/25/2011   mod to severe MR,LA severe dilated,mild annular ca+,mod. TR,mod PI,mod ca+ of the aortic valve leaflet    Allergies  Allergen Reactions  . Iohexol Rash and Other (See Comments)     Desc: VOMITING-VERIFIED ON 05-24-04 PRE-PROCEDURE/ ARS   . Shrimp [Shellfish Allergy] Nausea And Vomiting and Rash    Current Outpatient Medications  Medication Sig Dispense Refill  . amLODipine (NORVASC) 2.5 MG tablet Take 1 tablet (2.5 mg total) by mouth every morning. 90 tablet 3  . atorvastatin (LIPITOR) 10 MG tablet TAKE ONE TABLET BY MOUTH EVERY EVENING 90 tablet 2  . furosemide (LASIX) 40 MG tablet Take one tablet ($RemoveBef'40mg'[id number]$ ) by mouth daily except on Tuesday and Friday take 80 mg (2 tablets) 120 tablet 3  . hydrochlorothiazide (HYDRODIURIL) 25 MG tablet Take 0.5 tablets (12.5 mg total) by mouth every morning. (Patient taking differently: Take 25 mg by mouth every morning. ) 90 tablet 1  . isosorbide mononitrate (IMDUR) 30 MG 24 hr tablet TAKE 1 TABLET (30 MG TOTAL) BY MOUTH EVERY MORNING. 90 tablet 2  . methimazole (TAPAZOLE) 10 MG tablet TAKE 1/2 TABLET BY MOUTH ONCE DAILY 45 tablet 1  . metoprolol succinate (TOPROL-XL) 50 MG 24 hr tablet Take 1 tablet (50 mg total) by mouth daily. Take with or immediately following a meal. 90 tablet 1  . pantoprazole (PROTONIX) 40 MG tablet Take 40 mg by mouth daily.    . potassium chloride SA (KLOR-CON) 20 MEQ tablet Take 40 mEq daily except on Tuesday and Friday take 60 mEg (3 tablets) 64 tablet 0  . ramipril (ALTACE) 5 MG capsule TAKE 1 CAPSULE (5 MG TOTAL) BY MOUTH DAILY. 90 capsule 2   No current facility-administered medications for this visit.    Socially she is widowed and has 4 children 4 grandchildren 4 great-grandchildren. There is no tobacco or alcohol use.  ROS General: Negative; No fevers, chills, or night sweats;    HEENT: Negative; No changes in vision or hearing, sinus congestion, difficulty swallowing Pulmonary: Negative; No cough, wheezing, shortness of breath, hemoptysis Cardiovascular: Negative; No chest pain, presyncope, syncope, palpitations  lower extremity edema GI: Negative; No nausea, vomiting, diarrhea, or abdominal pain GU: Negative; No dysuria, hematuria, or  difficulty voiding Musculoskeletal: Negative; no myalgias, joint pain, or weakness Hematologic/Oncology: Negative; no easy bruising, bleeding Endocrine: Positive for diabetes mellitus and thyroid disease. Neuro: Negative; no changes in balance, headaches Skin: Negative; No rashes or skin lesions Psychiatric: Negative; No behavioral problems, depression Sleep: Negative; No snoring, daytime sleepiness, hypersomnolence, bruxism, restless legs, hypnogognic hallucinations, no cataplexy Other comprehensive 14 point system review is negative.  PE BP (!) 119/55   Pulse 70   Ht 5' (1.524 m)   Wt 157 lb (71.2 kg)   SpO2 97%   BMI 30.66 kg/m    Repeat blood pressure by me was 106/66 in the left arm.  She was in a wheelchair.  Wt Readings from Last 3 Encounters:  01/11/20 157 lb (71.2 kg)  09/25/19 164 lb 3.2 oz (74.5 kg)  07/04/19 153 lb (69.4 kg)    General: Alert, oriented, no distress.  Skin: normal turgor, no rashes, warm and dry HEENT: Normocephalic, atraumatic. Pupils equal round and reactive to light; sclera anicteric; extraocular muscles intact; Nose without nasal septal hypertrophy Mouth/Parynx benign; Mallinpatti scale 3 Neck: JVD approximately 7 cm, no carotid bruits; normal carotid upstroke Lungs: clear to ausculatation and percussion; no wheezing or rales Chest wall: without tenderness to palpitation Heart: PMI not displaced, RRR, s1 s2 normal, 1/6 systolic murmur, no diastolic murmur, no rubs, gallops, thrills, or heaves Abdomen: soft, nontender; no hepatosplenomehaly, BS+; abdominal aorta nontender and not  dilated by palpation. Back: no CVA tenderness Pulses 2+ Musculoskeletal: full range of motion, normal strength, no joint deformities Extremities: 1+ left ankle feet edema, trace to 1+ right; no clubbing cyanosis , Homan's sign negative  Neurologic: grossly nonfocal; Cranial nerves grossly wnl Psychologic: Normal mood and affect   ECG (independently read by me): Ventricular paced rhythm at 70 bpm.  QTc interval 494 ms.  July 04, 2019 ECG (independently read by me): Ventricular paced rhythm at 74 bpm.  Occasional PVCs in a trigeminal pattern  September 2019 ECG (independently read by me): Ventricular paced rhythm at 70 bpm.  September 2018  ECG (independently read by me): ventricular paced rhythm at 72 bpm with a PVC.  QTc interval 508 ms.  QRS interval 176 ms.  March 2018 ECG (independently read by me): Underlying atrial fibrillation with ventricular paced rhythm at 70 bpm.  On 100% paced  September 2017 ECG (independently read by me): V paced rhythm at 72 bpm.  One PVC.  January 2017 ECG (independently read by me):  V paced rhythm at 70 bpm with underlying atrial fibrillation.  August 2016 ECG (independently read by me): 100% ventricular paced rhythm at 70 bpm with underlying atrial fibrillation.  August 2015 ECG (independently read by me): Underlying atrial fibrillation with ventricular paced rhythm at 70 beats per minute with 100% capture.  LABS BMP Latest Ref Rng & Units 06/21/2018 06/20/2018 06/20/2018  Glucose 70 - 99 mg/dL 98 - 108(H)  BUN 8 - 23 mg/dL 26(H) - 25(H)  Creatinine 0.44 - 1.00 mg/dL 1.04(H) - 1.28(H)  BUN/Creat Ratio 12 - 28 - - -  Sodium 135 - 145 mmol/L 133(L) - 136  Potassium 3.5 - 5.1 mmol/L 4.7 3.4(L) 2.8(L)  Chloride 98 - 111 mmol/L 99 - 94(L)  CO2 22 - 32 mmol/L 25 - 30  Calcium 8.9 - 10.3 mg/dL 8.5(L) - 8.3(L)   Hepatic Function Latest Ref Rng & Units 05/11/2017 11/20/2015 06/07/2015  Total Protein 6.0 - 8.5 g/dL 7.4 7.2 7.7  Albumin 3.2 - 4.6 g/dL 4.2 4.3  4.3  AST 0 - 40 IU/L 19 38(H) 25  ALT 0 - 32 IU/L 20 33(H) 21  Alk Phosphatase 39 - 117 IU/L 123(H) 108 82  Total Bilirubin 0.0 - 1.2 mg/dL 1.3(H) 1.3(H) 1.7(H)   CBC Latest Ref Rng & Units 06/20/2018 06/19/2018 06/18/2018  WBC 4.0 - 10.5 K/uL 8.0 6.2 6.2  Hemoglobin 12.0 - 15.0 g/dL 11.2(L) 11.2(L) 11.2(L)  Hematocrit 36 - 46 % 34.8(L) 36.5 34.5(L)  Platelets 150 - 400 K/uL 245 272 273   Lab Results  Component Value Date   MCV 87.0 06/20/2018   MCV 87.5 06/19/2018   MCV 87.3 06/18/2018   Lab Results  Component Value Date   TSH 3.620 06/19/2018   Lab Results  Component Value Date   HGBA1C 6.5 (H) 06/07/2015    Lipid Panel  No results found for: CHOL, TRIG, HDL, CHOLHDL, VLDL, LDLCALC, LDLDIRECT   INR today checked in office: 2.0  RADIOLOGY: No results found.  IMPRESSION:  1. Essential hypertension   2. CHB (complete heart block) (HCC)   3. Permanent atrial fibrillation ([place])   4. Long term current use of anticoagulant therapy   5. Lower extremity edema   6. Hyperlipidemia with target LDL less than 70     ASSESSMENT AND PLAN: Denise [name] is a 86 year old female who has a history of permanent atrial fibrillation and is status post AV node ablation and previously had a St. Jude identity dual-chamber pacemaker programmed to VVIR mode in light of Denise atrial fibrillation. She was hospitalized in March 2017. An Echo Doppler study at that time showed an EF of 45-50%. A follow-up echo Doppler study on 04/28/2017  showed an EF of 50-55%.  There was mild aortic valve stenosis with trivial AR.  There was moderate mitral annular calcification with mild MR.  There was severe biatrial enlargement. Denise pacemaker approached end-of-life and she underwent replacement with an Essure T MRI device in June 2019 programmed to VVIR due to Denise permanent atrial fibrillation.  She is pacemaker dependent.  She is anticoagulated on Eliquis instead of Denise previous warfarin.  There is no bleeding.  She has had  issues with lower extremity edema necessitating incremental dose adjustment of Denise furosemide.  She now is taking 40 mg 5 days/week and on Tuesday and Friday she takes 60 mg.  She takes potassium chloride 40 mEq on the days she takes 40 mg of Lasix but takes 60 mEq on the days she takes 60 mg of Lasix.  She continues to be on ramipril 5 mg, metoprolol succinate 50 mg, isosorbide 30 mg, amlodipine 5 mg and HCTZ 12.5 mg.  She does not have any anginal symptomatology.  On exam today there is ankle edema left greater than right.  Denise blood pressure on repeat by me was on the low side at 106/66.  I have suggested dose reduction of amlodipine down to 2.5 mg to see if this can reduce Denise lower extremity edema.  If she continues to have significant leg swelling on a as needed basis she can some weeks take Lasix at the 60 mg dose 3 times per week instead of 2.  She had eaten breakfast today.  However I will check chemistry, CBC and thyroid studies to monitor Denise renal function on Denise diuretic regimen as well as potassium status, and CBC since she is on Eliquis and previously had mild anemia.[name]  Denise ECG shows 100% ventricular paced rhythm at 70 bpm.  She is pacemaker dependent.  I will contact  Denise regarding laboratory.  She continues to be on low-dose atorvastatin 10 mg.  I will see Denise in 6 months for cardiology reevaluation or sooner as needed.   Troy Sine, MD, [facility]  01/11/2020 11:19 AM

## 2020-02-27 ENCOUNTER — Ambulatory Visit: Payer: Medicare Other

## 2020-03-19 LAB — CUP PACEART REMOTE DEVICE CHECK
Battery Remaining Longevity: 127 mo
Battery Remaining Percentage: 95.5 %
Battery Voltage: 3.01 V
Brady Statistic RV Percent Paced: 96 %
Date Time Interrogation Session: 20211214020015
Implantable Lead Implant Date: 20060313
Implantable Lead Implant Date: 20060313
Implantable Lead Location: 753859
Implantable Lead Location: 753860
Implantable Pulse Generator Implant Date: 20190604
Lead Channel Impedance Value: 450 Ohm
Lead Channel Pacing Threshold Amplitude: 1.125 V
Lead Channel Pacing Threshold Pulse Width: 0.5 ms
Lead Channel Sensing Intrinsic Amplitude: 10 mV
Lead Channel Setting Pacing Amplitude: 1.375
Lead Channel Setting Pacing Pulse Width: 0.5 ms
Lead Channel Setting Sensing Sensitivity: 4 mV
Pulse Gen Model: 2272
Pulse Gen Serial Number: 9029685

## 2020-05-23 ENCOUNTER — Inpatient Hospital Stay (HOSPITAL_COMMUNITY)
Admission: EM | Admit: 2020-05-23 | Discharge: 2020-06-03 | DRG: 481 | Disposition: A | Discharge status: Skilled Nursing Facility | Payer: Medicare Other | Source: Other Acute Inpatient Hospital | Attending: Internal Medicine | Admitting: Internal Medicine

## 2020-05-23 ENCOUNTER — Inpatient Hospital Stay (HOSPITAL_COMMUNITY): Payer: Medicare Other

## 2020-05-23 DIAGNOSIS — Z20822 Contact with and (suspected) exposure to covid-19: Secondary | ICD-10-CM | POA: Diagnosis present

## 2020-05-23 DIAGNOSIS — R188 Other ascites: Secondary | ICD-10-CM | POA: Diagnosis present

## 2020-05-23 DIAGNOSIS — I50812 Chronic right heart failure: Secondary | ICD-10-CM | POA: Diagnosis present

## 2020-05-23 DIAGNOSIS — I361 Nonrheumatic tricuspid (valve) insufficiency: Secondary | ICD-10-CM | POA: Diagnosis not present

## 2020-05-23 DIAGNOSIS — S7290XA Unspecified fracture of unspecified femur, initial encounter for closed fracture: Secondary | ICD-10-CM

## 2020-05-23 DIAGNOSIS — Z950 Presence of cardiac pacemaker: Secondary | ICD-10-CM

## 2020-05-23 DIAGNOSIS — S72431A Displaced fracture of medial condyle of right femur, initial encounter for closed fracture: Secondary | ICD-10-CM | POA: Diagnosis present

## 2020-05-23 DIAGNOSIS — E871 Hypo-osmolality and hyponatremia: Secondary | ICD-10-CM | POA: Diagnosis present

## 2020-05-23 DIAGNOSIS — K3189 Other diseases of stomach and duodenum: Secondary | ICD-10-CM

## 2020-05-23 DIAGNOSIS — E1122 Type 2 diabetes mellitus with diabetic chronic kidney disease: Secondary | ICD-10-CM | POA: Diagnosis present

## 2020-05-23 DIAGNOSIS — N1831 Chronic kidney disease, stage 3a: Secondary | ICD-10-CM | POA: Diagnosis present

## 2020-05-23 DIAGNOSIS — I4891 Unspecified atrial fibrillation: Secondary | ICD-10-CM | POA: Diagnosis not present

## 2020-05-23 DIAGNOSIS — D62 Acute posthemorrhagic anemia: Secondary | ICD-10-CM | POA: Diagnosis present

## 2020-05-23 DIAGNOSIS — E890 Postprocedural hypothyroidism: Secondary | ICD-10-CM | POA: Diagnosis present

## 2020-05-23 DIAGNOSIS — D631 Anemia in chronic kidney disease: Secondary | ICD-10-CM | POA: Diagnosis present

## 2020-05-23 DIAGNOSIS — M9711XA Periprosthetic fracture around internal prosthetic right knee joint, initial encounter: Secondary | ICD-10-CM | POA: Diagnosis present

## 2020-05-23 DIAGNOSIS — F0390 Unspecified dementia without behavioral disturbance: Secondary | ICD-10-CM | POA: Diagnosis present

## 2020-05-23 DIAGNOSIS — I130 Hypertensive heart and chronic kidney disease with heart failure and stage 1 through stage 4 chronic kidney disease, or unspecified chronic kidney disease: Secondary | ICD-10-CM | POA: Diagnosis present

## 2020-05-23 DIAGNOSIS — I340 Nonrheumatic mitral (valve) insufficiency: Secondary | ICD-10-CM | POA: Diagnosis not present

## 2020-05-23 DIAGNOSIS — I350 Nonrheumatic aortic (valve) stenosis: Secondary | ICD-10-CM | POA: Diagnosis not present

## 2020-05-23 DIAGNOSIS — S7291XA Unspecified fracture of right femur, initial encounter for closed fracture: Secondary | ICD-10-CM | POA: Diagnosis present

## 2020-05-23 DIAGNOSIS — Z6831 Body mass index (BMI) 31.0-31.9, adult: Secondary | ICD-10-CM | POA: Diagnosis not present

## 2020-05-23 DIAGNOSIS — I10 Essential (primary) hypertension: Secondary | ICD-10-CM | POA: Diagnosis not present

## 2020-05-23 DIAGNOSIS — E861 Hypovolemia: Secondary | ICD-10-CM | POA: Diagnosis not present

## 2020-05-23 DIAGNOSIS — R54 Age-related physical debility: Secondary | ICD-10-CM | POA: Diagnosis present

## 2020-05-23 DIAGNOSIS — Z7901 Long term (current) use of anticoagulants: Secondary | ICD-10-CM

## 2020-05-23 DIAGNOSIS — I442 Atrioventricular block, complete: Secondary | ICD-10-CM | POA: Diagnosis present

## 2020-05-23 DIAGNOSIS — E669 Obesity, unspecified: Secondary | ICD-10-CM | POA: Diagnosis present

## 2020-05-23 DIAGNOSIS — K5900 Constipation, unspecified: Secondary | ICD-10-CM | POA: Diagnosis not present

## 2020-05-23 DIAGNOSIS — S72341A Displaced spiral fracture of shaft of right femur, initial encounter for closed fracture: Secondary | ICD-10-CM

## 2020-05-23 DIAGNOSIS — Z9981 Dependence on supplemental oxygen: Secondary | ICD-10-CM

## 2020-05-23 DIAGNOSIS — Z419 Encounter for procedure for purposes other than remedying health state, unspecified: Secondary | ICD-10-CM

## 2020-05-23 DIAGNOSIS — Z66 Do not resuscitate: Secondary | ICD-10-CM | POA: Diagnosis present

## 2020-05-23 DIAGNOSIS — J9611 Chronic respiratory failure with hypoxia: Secondary | ICD-10-CM | POA: Diagnosis present

## 2020-05-23 DIAGNOSIS — N179 Acute kidney failure, unspecified: Secondary | ICD-10-CM | POA: Diagnosis present

## 2020-05-23 DIAGNOSIS — R011 Cardiac murmur, unspecified: Secondary | ICD-10-CM | POA: Diagnosis not present

## 2020-05-23 DIAGNOSIS — E611 Iron deficiency: Secondary | ICD-10-CM | POA: Diagnosis present

## 2020-05-23 DIAGNOSIS — E1165 Type 2 diabetes mellitus with hyperglycemia: Secondary | ICD-10-CM | POA: Diagnosis present

## 2020-05-23 DIAGNOSIS — Z01818 Encounter for other preprocedural examination: Secondary | ICD-10-CM

## 2020-05-23 DIAGNOSIS — J9811 Atelectasis: Secondary | ICD-10-CM | POA: Diagnosis present

## 2020-05-23 DIAGNOSIS — J449 Chronic obstructive pulmonary disease, unspecified: Secondary | ICD-10-CM | POA: Diagnosis present

## 2020-05-23 DIAGNOSIS — W1830XA Fall on same level, unspecified, initial encounter: Secondary | ICD-10-CM | POA: Diagnosis present

## 2020-05-23 DIAGNOSIS — Z978 Presence of other specified devices: Secondary | ICD-10-CM

## 2020-05-23 DIAGNOSIS — R0602 Shortness of breath: Secondary | ICD-10-CM

## 2020-05-23 DIAGNOSIS — I428 Other cardiomyopathies: Secondary | ICD-10-CM

## 2020-05-23 DIAGNOSIS — Z0189 Encounter for other specified special examinations: Secondary | ICD-10-CM

## 2020-05-23 DIAGNOSIS — E079 Disorder of thyroid, unspecified: Secondary | ICD-10-CM | POA: Diagnosis present

## 2020-05-23 DIAGNOSIS — I272 Pulmonary hypertension, unspecified: Secondary | ICD-10-CM | POA: Diagnosis present

## 2020-05-23 DIAGNOSIS — Y92009 Unspecified place in unspecified non-institutional (private) residence as the place of occurrence of the external cause: Secondary | ICD-10-CM | POA: Diagnosis not present

## 2020-05-23 DIAGNOSIS — R112 Nausea with vomiting, unspecified: Secondary | ICD-10-CM

## 2020-05-23 LAB — COMPREHENSIVE METABOLIC PANEL
ALT: 21 U/L (ref 0–44)
AST: 35 U/L (ref 15–41)
Albumin: 1.8 g/dL — ABNORMAL LOW (ref 3.5–5.0)
Alkaline Phosphatase: 172 U/L — ABNORMAL HIGH (ref 38–126)
Anion gap: 7 (ref 5–15)
BUN: 34 mg/dL — ABNORMAL HIGH (ref 8–23)
CO2: 31 mmol/L (ref 22–32)
Calcium: 8.5 mg/dL — ABNORMAL LOW (ref 8.9–10.3)
Chloride: 96 mmol/L — ABNORMAL LOW (ref 98–111)
Creatinine, Ser: 1.26 mg/dL — ABNORMAL HIGH (ref 0.44–1.00)
GFR, Estimated: 40 mL/min — ABNORMAL LOW (ref >60)
Glucose, Bld: 119 mg/dL — ABNORMAL HIGH (ref 70–99)
Potassium: 4 mmol/L (ref 3.5–5.1)
Sodium: 134 mmol/L — ABNORMAL LOW (ref 135–145)
Total Bilirubin: 0.9 mg/dL (ref 0.3–1.2)
Total Protein: 7.2 g/dL (ref 6.5–8.1)

## 2020-05-23 LAB — CBC
HCT: 26.6 % — ABNORMAL LOW (ref 36.0–46.0)
HCT: 24.9 % — ABNORMAL LOW (ref 36.0–46.0)
Hemoglobin: 8.2 g/dL — ABNORMAL LOW (ref 12.0–15.0)
Hemoglobin: 8 g/dL — ABNORMAL LOW (ref 12.0–15.0)
MCH: 26 pg (ref 26.0–34.0)
MCH: 26.8 pg (ref 26.0–34.0)
MCHC: 30.8 g/dL (ref 30.0–36.0)
MCHC: 32.1 g/dL (ref 30.0–36.0)
MCV: 84.4 fL (ref 80.0–100.0)
MCV: 83.6 fL (ref 80.0–100.0)
Platelets: 367 10*3/uL (ref 150–400)
Platelets: 378 10*3/uL (ref 150–400)
RBC: 3.15 MIL/uL — ABNORMAL LOW (ref 3.87–5.11)
RBC: 2.98 MIL/uL — ABNORMAL LOW (ref 3.87–5.11)
RDW: 16.8 % — ABNORMAL HIGH (ref 11.5–15.5)
RDW: 17.2 % — ABNORMAL HIGH (ref 11.5–15.5)
WBC: 10 10*3/uL (ref 4.0–10.5)
WBC: 8.6 10*3/uL (ref 4.0–10.5)
nRBC: 0 % (ref 0.0–0.2)
nRBC: 0 % (ref 0.0–0.2)

## 2020-05-23 LAB — GLUCOSE, CAPILLARY
Glucose-Capillary: 109 mg/dL — ABNORMAL HIGH (ref 70–99)
Glucose-Capillary: 111 mg/dL — ABNORMAL HIGH (ref 70–99)
Glucose-Capillary: 111 mg/dL — ABNORMAL HIGH (ref 70–99)
Glucose-Capillary: 138 mg/dL — ABNORMAL HIGH (ref 70–99)

## 2020-05-23 LAB — VITAMIN B12: Vitamin B-12: 598 pg/mL (ref 180–914)

## 2020-05-23 LAB — HEMOGLOBIN A1C
Hgb A1c MFr Bld: 5.9 % — ABNORMAL HIGH (ref 4.8–5.6)
Mean Plasma Glucose: 122.63 mg/dL

## 2020-05-23 LAB — IRON AND TIBC
Iron: 16 ug/dL — ABNORMAL LOW (ref 28–170)
Saturation Ratios: 10 % — ABNORMAL LOW (ref 10.4–31.8)
TIBC: 160 ug/dL — ABNORMAL LOW (ref 250–450)
UIBC: 144 ug/dL

## 2020-05-23 LAB — T4, FREE: Free T4: 1.05 ng/dL (ref 0.61–1.12)

## 2020-05-23 LAB — VITAMIN D 25 HYDROXY (VIT D DEFICIENCY, FRACTURES): Vit D, 25-Hydroxy: 58.77 ng/mL (ref 30–100)

## 2020-05-23 LAB — SARS CORONAVIRUS 2 (TAT 6-24 HRS): SARS Coronavirus 2: NEGATIVE

## 2020-05-23 LAB — SURGICAL PCR SCREEN
MRSA, PCR: NEGATIVE
Staphylococcus aureus: NEGATIVE

## 2020-05-23 LAB — FOLATE: Folate: 12.7 ng/mL (ref >5.9)

## 2020-05-23 LAB — FERRITIN: Ferritin: 189 ng/mL (ref 11–307)

## 2020-05-23 LAB — RETICULOCYTES
Immature Retic Fract: 17.7 % — ABNORMAL HIGH (ref 2.3–15.9)
RBC.: 2.97 MIL/uL — ABNORMAL LOW (ref 3.87–5.11)
Retic Count, Absolute: 50.8 10*3/uL (ref 19.0–186.0)
Retic Ct Pct: 1.7 % (ref 0.4–3.1)

## 2020-05-23 LAB — TSH: TSH: 1.975 u[IU]/mL (ref 0.350–4.500)

## 2020-05-23 MED ORDER — PANTOPRAZOLE SODIUM 40 MG IV SOLR
40.0000 mg | Freq: Two times a day (BID) | INTRAVENOUS | Status: DC
  Administered 2020-05-23 – 2020-05-26 (×7): 40 mg via INTRAVENOUS
  Filled 2020-05-23 (×7): qty 40

## 2020-05-23 MED ORDER — ISOSORBIDE MONONITRATE ER 30 MG PO TB24
30.0000 mg | ORAL_TABLET | Freq: Every day | ORAL | Status: DC
  Administered 2020-05-23 – 2020-05-26 (×4): 30 mg via ORAL
  Filled 2020-05-23 (×4): qty 1

## 2020-05-23 MED ORDER — ACETAMINOPHEN 325 MG PO TABS
650.0000 mg | ORAL_TABLET | Freq: Four times a day (QID) | ORAL | Status: DC | PRN
  Administered 2020-05-23 – 2020-05-26 (×4): 650 mg via ORAL
  Filled 2020-05-23 (×4): qty 2

## 2020-05-23 MED ORDER — ATORVASTATIN CALCIUM 10 MG PO TABS
10.0000 mg | ORAL_TABLET | Freq: Every evening | ORAL | Status: DC
  Administered 2020-05-23 – 2020-05-25 (×3): 10 mg via ORAL
  Filled 2020-05-23 (×3): qty 1

## 2020-05-23 MED ORDER — METHIMAZOLE 5 MG PO TABS
5.0000 mg | ORAL_TABLET | Freq: Every day | ORAL | Status: DC
  Administered 2020-05-23 – 2020-05-26 (×4): 5 mg via ORAL
  Filled 2020-05-23 (×4): qty 1

## 2020-05-23 MED ORDER — ONDANSETRON HCL 4 MG/2ML IJ SOLN: 4.0000 mg | Freq: Four times a day (QID) | INTRAMUSCULAR | Status: DC | PRN

## 2020-05-23 MED ORDER — SODIUM CHLORIDE 0.9 % IV SOLN
510.0000 mg | Freq: Once | INTRAVENOUS | Status: AC
  Administered 2020-05-23: 510 mg via INTRAVENOUS
  Filled 2020-05-23: qty 17

## 2020-05-23 MED ORDER — METOPROLOL SUCCINATE ER 25 MG PO TB24
25.0000 mg | ORAL_TABLET | Freq: Every day | ORAL | Status: DC
  Administered 2020-05-23 – 2020-05-26 (×4): 25 mg via ORAL
  Filled 2020-05-23 (×5): qty 1

## 2020-05-23 MED ORDER — METOPROLOL SUCCINATE ER 50 MG PO TB24: 50.0000 mg | ORAL_TABLET | Freq: Every day | ORAL | Status: DC

## 2020-05-23 MED ORDER — FENTANYL CITRATE (PF) 100 MCG/2ML IJ SOLN: 12.5000 ug | INTRAMUSCULAR | Status: DC | PRN

## 2020-05-23 MED ORDER — CHLORHEXIDINE GLUCONATE CLOTH 2 % EX PADS
6.0000 | MEDICATED_PAD | Freq: Every day | CUTANEOUS | Status: DC
  Administered 2020-05-25 – 2020-05-31 (×7): 6 via TOPICAL

## 2020-05-23 MED ORDER — SENNOSIDES-DOCUSATE SODIUM 8.6-50 MG PO TABS: 1.0000 | ORAL_TABLET | Freq: Every evening | ORAL | Status: DC | PRN

## 2020-05-23 MED ORDER — GLUCERNA SHAKE PO LIQD
237.0000 mL | Freq: Three times a day (TID) | ORAL | Status: DC
  Administered 2020-05-24 – 2020-05-26 (×5): 237 mL via ORAL

## 2020-05-23 MED ORDER — ACETAMINOPHEN 650 MG RE SUPP: 650.0000 mg | RECTAL | Status: DC | PRN

## 2020-05-23 MED ORDER — ADULT MULTIVITAMIN W/MINERALS CH: 1.0000 | ORAL_TABLET | Freq: Every day | ORAL | Status: DC

## 2020-05-23 MED ORDER — LABETALOL HCL 5 MG/ML IV SOLN: 10.0000 mg | INTRAVENOUS | Status: DC | PRN

## 2020-05-23 MED ORDER — INSULIN ASPART 100 UNIT/ML ~~LOC~~ SOLN: 0.0000 [IU] | SUBCUTANEOUS | Status: DC

## 2020-05-23 NOTE — Progress Notes (Signed)
Initial Nutrition Assessment  DOCUMENTATION CODES:   Not applicable  INTERVENTION:   -Obtain current pt wt -Once diet is advanced, add:   -Glucerna Shake po TID, each supplement provides 220 kcal and 10 grams of protein -MVI with minerals daily  NUTRITION DIAGNOSIS:   Increased nutrient needs related to post-op healing as evidenced by estimated needs.  GOAL:   Patient will meet greater than or equal to 90% of their needs  MONITOR:   PO intake,Supplement acceptance,Diet advancement,Weight trends,I & O's,Labs,Skin  REASON FOR ASSESSMENT:   Consult Assessment of nutrition requirement/status,Hip fracture protocol  ASSESSMENT:   Denise [name] is a 87 y.o. female with medical history significant for hypertension, nonischemic cardiomyopathy, heart block with pacer, hypothyroidism, chronic 2 L/min supplemental oxygen requirement, atrial fibrillation on Eliquis, CKD 3A, type 2 diabetes mellitus, and suspected dementia per family who presents to the emergency department with right leg pain after she was found on the floor  Pt admitted with rt femur fracture s/p fall.   Pt unavailable at time of visit. Unable to obtain further nutriton-related history or complete nutriton-focused physical exam at this time.   Pt awaiting orthopedics consult for further management; currently NPO for possible surgery.   No new wt since 01/11/20; unsure if pt has lost weight PTA.   Pt with increased nutritional needs for post-operative healing and would benefit from addition of oral nutrition supplements.   Lab Results  Component Value Date   HGBA1C 6.5 (H) 06/07/2015   PTA DM medications are none.   Labs reviewed: Denise [name]: 134 (inpatient orders for glycemic control are 0-6 units insulin aspart every 4 hours).   Diet Order:   Diet Order            Diet NPO time specified Except for: Sips with Meds, Ice Chips  Diet effective now                 EDUCATION NEEDS:   No education needs have  been identified at this time  Skin:  Skin Assessment: Reviewed RN Assessment  Last BM:  Unknown  Height:   Ht Readings from Last 1 Encounters:  01/11/20 5' (1.524 m)    Weight:   Wt Readings from Last 1 Encounters:  01/11/20 71.2 kg    Ideal Body Weight:  45.5 kg  BMI:  There is no height or weight on file to calculate BMI.  Estimated Nutritional Needs:   Kcal:  1350-1550  Protein:  60-75 grams  Fluid:  > 1.3 L    Levada Schilling, RD, LDN, CDCES Registered Dietitian II Certified Diabetes Care and Education Specialist Please refer to [facility] for RD and/or RD on-call/weekend/after hours pager

## 2020-05-23 NOTE — Progress Notes (Signed)
[facility] PLAN OF CARE NOTE Patient: Denise [name] OTL:[id number]   PCP: Daryl Eastern, MD DOB: [birth year]   DOA: 05/23/2020   DOS: 05/23/2020    Patient was admitted by my colleague earlier on 05/23/2020. I have reviewed the H&P as well as assessment and plan and agree with the same. Important changes in the plan are listed below.  Plan of care: Principal Problem:   Closed fracture of right femur (HCC) Active Problems:   Atrial fibrillation (HCC)   HTN (hypertension)   Nonischemic cardiomyopathy (HCC)   Thyroid disease   Chronic kidney disease, stage 3a (HCC)   Chronic respiratory failure with hypoxia (HCC)   Anemia due to chronic kidney disease   Femur fracture, right (HCC)  Preop evaluation. Per Chales Abrahams preop scoring, patient's risk is 2.0 % for myocardial infarction or cardiac arrest, intraoperatively or up to 30 days post-op.  Further work-up not necessary at present.  Reported history of hematemesis. H&H relatively stable.  No further vomiting here noted in the hospital. Continue PPI twice daily. Monitor Hemoccult. Iron deficiency based on the blood work therefore we will provide IV iron.  Author: Lynden Oxford, MD [facility] 05/23/2020 6:14 PM   If 7PM-7AM, please contact night-coverage at [email or web address]

## 2020-05-23 NOTE — Plan of Care (Signed)
Patient arrived to the floor stable. Dr. Antionette Char stated no need for telemetry, verbal order for EKG. Patient is not in pain. Patient resting well.   Problem: Education: Goal: Knowledge of General Education information will improve Description: Including pain rating scale, medication(s)/side effects and non-pharmacologic comfort measures Outcome: Progressing   Problem: Activity: Goal: Risk for activity intolerance will decrease Outcome: Progressing   Problem: Pain Managment: Goal: General experience of comfort will improve Outcome: Progressing   Problem: Safety: Goal: Ability to remain free from injury will improve Outcome: Progressing   Problem: Skin Integrity: Goal: Risk for impaired skin integrity will decrease Outcome: Progressing

## 2020-05-23 NOTE — H&P (Signed)
History and Physical    Denise [name] BTD:[id number] DOB: [birth year] DOA: 05/23/2020  PCP: Daryl Eastern, MD   Patient coming from: Home   Chief Complaint: Found down, right leg pain   HPI: Denise [name] is a 87 y.o. female with medical history significant for hypertension, nonischemic cardiomyopathy, heart block with pacer, hypothyroidism, chronic 2 L/min supplemental oxygen requirement, atrial fibrillation on Eliquis, CKD 3A, type 2 diabetes mellitus, and suspected dementia per family who presents to the emergency department with right leg pain after she was found on the floor.  History is obtained through chart review and from the patient's daughter by phone.  Patient lives with her son who was unable to be reached but per report of the patient's daughter, patient was found on the bathroom floor when her son returned home from work.  Patient was unable to say what happened but appeared to have pain with any movement of the right leg.  She was reportedly in her usual state leading up to this and family did not believe that she had been on the ground more than an hour or so.  [facility] ED Course: Upon arrival to the ED, patient is found to be afebrile, saturating well on her usual 2 L/min of supplemental oxygen, and with stable blood pressure.  EKG features a paced rhythm.  Noncontrast head CT was negative for acute intracranial bleed or hydrocephalus and there was no acute fracture or subluxation on cervical spine CT.  Radiographs of the right knee and femur revealed a mildly displaced spiral fracture through the mid to distal right femoral shaft extending to the femoral component of arthroplasty.  Chemistry panel features a slight hyponatremia, elevated BUN to creatinine ratio, alkaline phosphatase 208, albumin 2.4, and protein 9.0.  CBC features a leukocytosis 12,700, globin 9.8, and platelets 115,000. CK was undetectable. Troponin was normal.  Patient was treated with  morphine and Zofran in the ED, he was placed in immobilizer, and she was accepted for transfer to [facility] for admission.  Per report of the patient's daughter, the patient vomited what appeared to be dark blood.  Review of Systems:  Unable to complete ROS due to the patient's clinical condition.  Past Medical History:  Diagnosis Date  . CHF (congestive heart failure) (HCC)   . Diabetes mellitus without complication (HCC)   . Hypertension   . LVH (left ventricular hypertrophy)   . Mitral regurgitation    mod to severe  . Permanent atrial fibrillation (HCC)   . Presence of permanent cardiac pacemaker   . Pulmonary hypertension (HCC)    severe  . Thyroid disease   . Tricuspid regurgitation    severe    Past Surgical History:  Procedure Laterality Date  . ABDOMINAL HYSTERECTOMY    . CARDIOVERSION  5 /18/ 2000   unsuccessful  . CHOLECYSTECTOMY    . COMPRESSION HIP SCREW Right 06/06/2012   Procedure: Open Reduction Internal Fixation Right HIp;  Surgeon: Dannielle Huh, MD;  Location: [facility] OR;  Service: Orthopedics;  Laterality: Right;  . ESOPHAGOGASTRODUODENOSCOPY N/A 06/09/2015   Procedure: ESOPHAGOGASTRODUODENOSCOPY (EGD);  Surgeon: Charlott Rakes, MD;  Location: [facility] ENDOSCOPY;  Service: Endoscopy;  Laterality: N/A;  . knee replacement     R  . NM MYOCAR PERF WALL MOTION  02/28/2009   No signficant ischemia  . PACEMAKER INSERTION  05/26/2004   [facility]  . PPM GENERATOR CHANGEOUT N/A 08/17/2017   Procedure: PPM GENERATOR CHANGEOUT;  Surgeon: Thurmon Fair,  MD;  [facility]: [facility] INVASIVE CV LAB;  Service: Cardiovascular;  Laterality: N/A;  . THYROIDECTOMY    . TUBAL LIGATION    . US ECHOCARDIOGRAPHY  02/25/2011   mod to severe MR,LA severe dilated,mild annular ca+,mod. TR,mod PI,mod ca+ of the aortic valve leaflet    Social History:   reports that she has never smoked. She has never used smokeless tobacco. She reports that she does not drink alcohol and does not use  drugs.  Allergies  Allergen Reactions  . Iohexol Rash and Other (See Comments)     Desc: VOMITING-VERIFIED ON 05-24-04 PRE-PROCEDURE/ ARS   . Shrimp [Shellfish Allergy] Nausea And Vomiting and Rash    Family History  Problem Relation Age of Onset  . Asthma Mother   . Heart attack Father   . Stroke Sister   . Heart attack Brother   . Cancer - Colon Brother   . Gallbladder disease Maternal Grandmother   . Stroke Maternal Grandfather   . Heart disease Child      Prior to Admission medications   Medication Sig Start Date End Date Taking? Authorizing Provider  amLODipine (NORVASC) 2.5 MG tablet Take 1 tablet (2.5 mg total) by mouth every morning. 01/11/20   Lennette Bihari, MD  atorvastatin (LIPITOR) 10 MG tablet TAKE ONE TABLET BY MOUTH EVERY EVENING 12/26/19   Lennette Bihari, MD  furosemide (LASIX) 40 MG tablet Take one tablet (40mg ) by mouth daily except on Tuesday and Friday take 80 mg (2 tablets) 09/25/19   Croitoru, 11/26/19, MD  hydrochlorothiazide (HYDRODIURIL) 25 MG tablet Take 0.5 tablets (12.5 mg total) by mouth every morning. Patient taking differently: Take 25 mg by mouth every morning.  03/31/16   04/02/16, MD  isosorbide mononitrate (IMDUR) 30 MG 24 hr tablet TAKE 1 TABLET (30 MG TOTAL) BY MOUTH EVERY MORNING. 11/01/18   11/03/18, PA  methimazole (TAPAZOLE) 10 MG tablet TAKE 1/2 TABLET BY MOUTH ONCE DAILY 01/10/19   01/12/19, MD  metoprolol succinate (TOPROL-XL) 50 MG 24 hr tablet Take 1 tablet (50 mg total) by mouth daily. Take with or immediately following a meal. 10/12/18   10/14/18, MD  pantoprazole (PROTONIX) 40 MG tablet Take 40 mg by mouth daily. 06/04/18   [provider]  potassium chloride SA (KLOR-CON) 20 MEQ tablet Take 40 mEq daily except on Tuesday and Friday take 60 mEg (3 tablets) 10/19/19   Croitoru, Mihai, MD  ramipril (ALTACE) 5 MG capsule TAKE 1 CAPSULE (5 MG TOTAL) BY MOUTH DAILY. 12/21/18   02/20/19, MD    Physical  Exam: Vitals:   05/23/20 0300  BP: 131/72  Pulse: 72  Resp: 16  Temp: 97.8 F (36.6 C)  TempSrc: Oral  SpO2: 98%    Constitutional: NAD, calm  Eyes: PERTLA, lids and conjunctivae normal ENMT: Mucous membranes are moist. Posterior pharynx clear of any exudate or lesions.   Neck: supple, no masses  Respiratory: no wheezing, no crackles. No accessory muscle use.  Cardiovascular: S1 & S2 heard, regular rate and rhythm. No significant JVD. Abdomen: No distension, no tenderness, soft. Bowel sounds active.  Musculoskeletal: no clubbing / cyanosis. Right leg tender, neurovascularly intact.   Skin: no significant rashes, lesions, ulcers. Warm, dry, well-perfused. Neurologic: CN 2-12 grossly intact. Sensation intact. Moving all extremities.  Psychiatric: Alert and oriented to person only. Pleasant and cooperative.    Labs and Imaging on Admission: I have personally reviewed following labs and imaging studies  CBC: No results for input(s): WBC, NEUTROABS, HGB, HCT, MCV, PLT in the last 168 hours. Basic Metabolic Panel: No results for input(s): NA, K, CL, CO2, GLUCOSE, BUN, CREATININE, CALCIUM, MG, PHOS in the last 168 hours. GFR: CrCl cannot be calculated (Patient's most recent lab result is older than the maximum 21 days allowed.). Liver Function Tests: No results for input(s): AST, ALT, ALKPHOS, BILITOT, PROT, ALBUMIN in the last 168 hours. No results for input(s): LIPASE, AMYLASE in the last 168 hours. No results for input(s): AMMONIA in the last 168 hours. Coagulation Profile: No results for input(s): INR, PROTIME in the last 168 hours. Cardiac Enzymes: No results for input(s): CKTOTAL, CKMB, CKMBINDEX, TROPONINI in the last 168 hours. BNP (last 3 results) No results for input(s): PROBNP in the last 8760 hours. HbA1C: No results for input(s): HGBA1C in the last 72 hours. CBG: No results for input(s): GLUCAP in the last 168 hours. Lipid Profile: No results for input(s): CHOL,  HDL, LDLCALC, TRIG, CHOLHDL, LDLDIRECT in the last 72 hours. Thyroid Function Tests: No results for input(s): TSH, T4TOTAL, FREET4, T3FREE, THYROIDAB in the last 72 hours. Anemia Panel: No results for input(s): VITAMINB12, FOLATE, FERRITIN, TIBC, IRON, RETICCTPCT in the last 72 hours. Urine analysis:    Component Value Date/Time   COLORURINE YELLOW 06/08/2012 1657   APPEARANCEUR CLEAR 06/08/2012 1657   LABSPEC 1.024 06/08/2012 1657   PHURINE 5.0 06/08/2012 1657   GLUCOSEU NEGATIVE 06/08/2012 1657   HGBUR NEGATIVE 06/08/2012 1657   BILIRUBINUR NEGATIVE 06/08/2012 1657   KETONESUR NEGATIVE 06/08/2012 1657   PROTEINUR NEGATIVE 06/08/2012 1657   UROBILINOGEN 0.2 06/08/2012 1657   NITRITE NEGATIVE 06/08/2012 1657   LEUKOCYTESUR NEGATIVE 06/08/2012 1657   Sepsis Labs: @LABRCNTIP (procalcitonin:4,lacticidven:4) )No results found for this or any previous visit (from the past 240 hour(s)).   Radiological Exams on Admission: No results found.  EKG: Independently reviewed. Ventricular pacing.   Assessment/Plan  1. Right femur fracture  - Presented to [facility] ED in [place] after she was found down at home and had x-rays concerning for mildly displaced right femoral shaft fracture  - Unable to view images, disc not sent with her, will repeat x-rays here, continue immobilization in splint, continue CMS checks, pain-control, hold Eliquis    2. ?Upper GI bleeding  - Patient's daughter reported that emesis in ED looked like dark blood  - Check stool or emesis for occult blood, hold Eliquis, type & screen, trend CBC, start IV PPI q12h for now    3. Atrial fibrillation  - CHADS-VASc at least 68 (age x2, CHF, HTN, DM, gender)  - Hold Eliquis, unclear when last dose was    4. Non-ischemic cardiomyopathy  - EF had normalized on most recent echo in 2019 at which time she had mild LVH, severe LAE, mild AI, mild MR, and moderate TR - She appears euvolemic  - Hold diuretics while NPO,  monitor volume-status   5. Complete heart block  - Pacer interrogated in January 2021, had normal device function    6. Type II DM   - Check CBGs and use a low-intensity SSI for now    7. CKD IIIa  - SCr was 1.00 in ED which appears to be her baseline - Renally-dose medications, monitor   8. Hypertension  - BP at goal, treat as-needed only for now    9. Chronic hypoxic respiratory failure  - Appears stable on her usual 2 Lpm   10. Dementia  - Per family the patient is  disoriented at baseline, walks with a walker, and is able to feed, dress, and toilet on her own    DVT prophylaxis: Eliquis pta, last dose uncertain, held on admission  Code Status: DNR, confirmed with patient's daughter   Level of Care: Level of care: Med-Surg Family Communication: Daughter Chyrl Civatte updated by phone  Disposition Plan:  Patient is from: home  Anticipated d/c is to: TBD Anticipated d/c date is: 05/27/20 Patient currently: Pending further eval and mgmt of femur fracture and possible hematemesis  Consults called: none  Admission status: Inpatient     Briscoe Deutscher, MD [facility]  05/23/2020, 6:12 AM

## 2020-05-24 ENCOUNTER — Inpatient Hospital Stay (HOSPITAL_COMMUNITY): Payer: Medicare Other | Admitting: Anesthesiology

## 2020-05-24 ENCOUNTER — Inpatient Hospital Stay (HOSPITAL_COMMUNITY): Payer: Medicare Other

## 2020-05-24 ENCOUNTER — Encounter (HOSPITAL_COMMUNITY)
Admission: EM | Disposition: A | Discharge status: Skilled Nursing Facility | Payer: Self-pay | Source: Other Acute Inpatient Hospital | Attending: Internal Medicine

## 2020-05-24 ENCOUNTER — Encounter (HOSPITAL_COMMUNITY): Payer: Self-pay | Admitting: Family Medicine

## 2020-05-24 ENCOUNTER — Other Ambulatory Visit: Payer: Self-pay

## 2020-05-24 DIAGNOSIS — S72341A Displaced spiral fracture of shaft of right femur, initial encounter for closed fracture: Secondary | ICD-10-CM | POA: Diagnosis not present

## 2020-05-24 HISTORY — PX: FEMUR IM NAIL: SHX1597

## 2020-05-24 HISTORY — PX: OTHER SURGICAL HISTORY: SHX169

## 2020-05-24 LAB — GLUCOSE, CAPILLARY
Glucose-Capillary: 113 mg/dL — ABNORMAL HIGH (ref 70–99)
Glucose-Capillary: 119 mg/dL — ABNORMAL HIGH (ref 70–99)
Glucose-Capillary: 121 mg/dL — ABNORMAL HIGH (ref 70–99)
Glucose-Capillary: 119 mg/dL — ABNORMAL HIGH (ref 70–99)
Glucose-Capillary: 103 mg/dL — ABNORMAL HIGH (ref 70–99)
Glucose-Capillary: 126 mg/dL — ABNORMAL HIGH (ref 70–99)
Glucose-Capillary: 130 mg/dL — ABNORMAL HIGH (ref 70–99)
Glucose-Capillary: 139 mg/dL — ABNORMAL HIGH (ref 70–99)
Glucose-Capillary: 141 mg/dL — ABNORMAL HIGH (ref 70–99)

## 2020-05-24 LAB — BASIC METABOLIC PANEL
Anion gap: 10 (ref 5–15)
BUN: 38 mg/dL — ABNORMAL HIGH (ref 8–23)
CO2: 26 mmol/L (ref 22–32)
Calcium: 8.2 mg/dL — ABNORMAL LOW (ref 8.9–10.3)
Chloride: 95 mmol/L — ABNORMAL LOW (ref 98–111)
Creatinine, Ser: 1.46 mg/dL — ABNORMAL HIGH (ref 0.44–1.00)
GFR, Estimated: 33 mL/min — ABNORMAL LOW (ref >60)
Glucose, Bld: 120 mg/dL — ABNORMAL HIGH (ref 70–99)
Potassium: 3.9 mmol/L (ref 3.5–5.1)
Sodium: 131 mmol/L — ABNORMAL LOW (ref 135–145)

## 2020-05-24 LAB — CBC
HCT: 22.5 % — ABNORMAL LOW (ref 36.0–46.0)
HCT: 26.7 % — ABNORMAL LOW (ref 36.0–46.0)
Hemoglobin: 7.1 g/dL — ABNORMAL LOW (ref 12.0–15.0)
Hemoglobin: 8.7 g/dL — ABNORMAL LOW (ref 12.0–15.0)
MCH: 26.8 pg (ref 26.0–34.0)
MCH: 28.1 pg (ref 26.0–34.0)
MCHC: 31.6 g/dL (ref 30.0–36.0)
MCHC: 32.6 g/dL (ref 30.0–36.0)
MCV: 84.9 fL (ref 80.0–100.0)
MCV: 86.1 fL (ref 80.0–100.0)
Platelets: 318 10*3/uL (ref 150–400)
Platelets: 302 10*3/uL (ref 150–400)
RBC: 2.65 MIL/uL — ABNORMAL LOW (ref 3.87–5.11)
RBC: 3.1 MIL/uL — ABNORMAL LOW (ref 3.87–5.11)
RDW: 17.3 % — ABNORMAL HIGH (ref 11.5–15.5)
RDW: 16.8 % — ABNORMAL HIGH (ref 11.5–15.5)
WBC: 9.6 10*3/uL (ref 4.0–10.5)
WBC: 11.5 10*3/uL — ABNORMAL HIGH (ref 4.0–10.5)
nRBC: 0 % (ref 0.0–0.2)
nRBC: 0 % (ref 0.0–0.2)

## 2020-05-24 LAB — PREPARE RBC (CROSSMATCH)

## 2020-05-24 LAB — MAGNESIUM: Magnesium: 1.8 mg/dL (ref 1.7–2.4)

## 2020-05-24 SURGERY — INSERTION, INTRAMEDULLARY ROD, FEMUR, RETROGRADE
Anesthesia: General | Site: Leg Upper | Laterality: Right

## 2020-05-24 MED ORDER — ACETAMINOPHEN 10 MG/ML IV SOLN
INTRAVENOUS | Status: DC | PRN
  Administered 2020-05-24: 1000 mg via INTRAVENOUS

## 2020-05-24 MED ORDER — PHENYLEPHRINE HCL-NACL 10-0.9 MG/250ML-% IV SOLN
INTRAVENOUS | Status: DC | PRN
  Administered 2020-05-24: 50 ug/min via INTRAVENOUS

## 2020-05-24 MED ORDER — CHLORHEXIDINE GLUCONATE 0.12 % MT SOLN: 15.0000 mL | Freq: Once | OROMUCOSAL | Status: AC

## 2020-05-24 MED ORDER — FENTANYL CITRATE (PF) 100 MCG/2ML IJ SOLN
INTRAMUSCULAR | Status: DC | PRN
  Administered 2020-05-24 (×5): 25 ug via INTRAVENOUS

## 2020-05-24 MED ORDER — ALBUMIN HUMAN 5 % IV SOLN: INTRAVENOUS | Status: DC | PRN

## 2020-05-24 MED ORDER — ONDANSETRON HCL 4 MG/2ML IJ SOLN
INTRAMUSCULAR | Status: DC | PRN
  Administered 2020-05-24: 4 mg via INTRAVENOUS

## 2020-05-24 MED ORDER — CEFAZOLIN SODIUM-DEXTROSE 2-4 GM/100ML-% IV SOLN
2.0000 g | Freq: Three times a day (TID) | INTRAVENOUS | Status: AC
  Administered 2020-05-24 – 2020-05-25 (×3): 2 g via INTRAVENOUS
  Filled 2020-05-24 (×3): qty 100

## 2020-05-24 MED ORDER — METOCLOPRAMIDE HCL 5 MG PO TABS: 5.0000 mg | ORAL_TABLET | Freq: Three times a day (TID) | ORAL | Status: DC | PRN

## 2020-05-24 MED ORDER — PROPOFOL 10 MG/ML IV BOLUS
INTRAVENOUS | Status: DC | PRN
  Administered 2020-05-24: 70 mg via INTRAVENOUS

## 2020-05-24 MED ORDER — OXYCODONE HCL 5 MG/5ML PO SOLN
5.0000 mg | Freq: Once | ORAL | Status: DC | PRN
Start: 2020-05-24 — End: 2020-05-24

## 2020-05-24 MED ORDER — SODIUM CHLORIDE 0.9 % IV SOLN: INTRAVENOUS | Status: DC | PRN

## 2020-05-24 MED ORDER — DOCUSATE SODIUM 100 MG PO CAPS
100.0000 mg | ORAL_CAPSULE | Freq: Two times a day (BID) | ORAL | Status: DC
  Administered 2020-05-24 – 2020-05-25 (×3): 100 mg via ORAL
  Filled 2020-05-24 (×3): qty 1

## 2020-05-24 MED ORDER — SUCCINYLCHOLINE CHLORIDE 200 MG/10ML IV SOSY
PREFILLED_SYRINGE | INTRAVENOUS | Status: AC
  Filled 2020-05-24: qty 10

## 2020-05-24 MED ORDER — LIDOCAINE 2% (20 MG/ML) 5 ML SYRINGE
INTRAMUSCULAR | Status: AC
  Filled 2020-05-24: qty 5

## 2020-05-24 MED ORDER — VANCOMYCIN HCL 1000 MG IV SOLR
INTRAVENOUS | Status: AC
  Filled 2020-05-24: qty 1000

## 2020-05-24 MED ORDER — ONDANSETRON HCL 4 MG/2ML IJ SOLN
INTRAMUSCULAR | Status: AC
  Filled 2020-05-24: qty 4

## 2020-05-24 MED ORDER — FENTANYL CITRATE (PF) 100 MCG/2ML IJ SOLN
12.5000 ug | INTRAMUSCULAR | Status: DC | PRN
  Administered 2020-05-26 (×2): 25 ug via INTRAVENOUS
  Filled 2020-05-24 (×2): qty 2

## 2020-05-24 MED ORDER — DEXAMETHASONE SODIUM PHOSPHATE 10 MG/ML IJ SOLN
INTRAMUSCULAR | Status: AC
  Filled 2020-05-24: qty 1

## 2020-05-24 MED ORDER — ONDANSETRON HCL 4 MG/2ML IJ SOLN
4.0000 mg | Freq: Four times a day (QID) | INTRAMUSCULAR | Status: DC | PRN
  Administered 2020-05-26: 4 mg via INTRAVENOUS
  Filled 2020-05-24: qty 2

## 2020-05-24 MED ORDER — CHLORHEXIDINE GLUCONATE 0.12 % MT SOLN
OROMUCOSAL | Status: AC
  Administered 2020-05-24: 15 mL via OROMUCOSAL
  Filled 2020-05-24: qty 15

## 2020-05-24 MED ORDER — ONDANSETRON HCL 4 MG PO TABS
4.0000 mg | ORAL_TABLET | Freq: Four times a day (QID) | ORAL | Status: DC | PRN
Start: 2020-05-24 — End: 2020-05-27
  Filled 2020-05-24: qty 1

## 2020-05-24 MED ORDER — CEFAZOLIN SODIUM-DEXTROSE 2-4 GM/100ML-% IV SOLN
2.0000 g | Freq: Once | INTRAVENOUS | Status: AC
  Administered 2020-05-24: 2 g via INTRAVENOUS

## 2020-05-24 MED ORDER — POTASSIUM CHLORIDE IN NACL 20-0.9 MEQ/L-% IV SOLN
INTRAVENOUS | Status: DC
  Filled 2020-05-24: qty 1000

## 2020-05-24 MED ORDER — METOCLOPRAMIDE HCL 5 MG/ML IJ SOLN
5.0000 mg | Freq: Three times a day (TID) | INTRAMUSCULAR | Status: DC | PRN
Start: 2020-05-24 — End: 2020-05-26

## 2020-05-24 MED ORDER — LIDOCAINE 2% (20 MG/ML) 5 ML SYRINGE
INTRAMUSCULAR | Status: DC | PRN
  Administered 2020-05-24: 40 mg via INTRAVENOUS

## 2020-05-24 MED ORDER — VANCOMYCIN HCL 1000 MG IV SOLR
INTRAVENOUS | Status: DC | PRN
  Administered 2020-05-24: 1000 mg via TOPICAL

## 2020-05-24 MED ORDER — POLYETHYLENE GLYCOL 3350 17 G PO PACK: 17.0000 g | PACK | Freq: Every day | ORAL | Status: DC | PRN

## 2020-05-24 MED ORDER — PHENYLEPHRINE 40 MCG/ML (10ML) SYRINGE FOR IV PUSH (FOR BLOOD PRESSURE SUPPORT)
PREFILLED_SYRINGE | INTRAVENOUS | Status: AC
  Filled 2020-05-24: qty 10

## 2020-05-24 MED ORDER — LACTATED RINGERS IV SOLN: INTRAVENOUS | Status: DC

## 2020-05-24 MED ORDER — ORAL CARE MOUTH RINSE: 15.0000 mL | Freq: Once | OROMUCOSAL | Status: AC

## 2020-05-24 MED ORDER — FENTANYL CITRATE (PF) 100 MCG/2ML IJ SOLN: 25.0000 ug | INTRAMUSCULAR | Status: DC | PRN

## 2020-05-24 MED ORDER — ONDANSETRON HCL 4 MG/2ML IJ SOLN: 4.0000 mg | Freq: Once | INTRAMUSCULAR | Status: DC | PRN

## 2020-05-24 MED ORDER — EPHEDRINE 5 MG/ML INJ
INTRAVENOUS | Status: AC
  Filled 2020-05-24: qty 10

## 2020-05-24 MED ORDER — ROCURONIUM BROMIDE 10 MG/ML (PF) SYRINGE
PREFILLED_SYRINGE | INTRAVENOUS | Status: AC
  Filled 2020-05-24: qty 10

## 2020-05-24 MED ORDER — OXYCODONE HCL 5 MG PO TABS
5.0000 mg | ORAL_TABLET | Freq: Once | ORAL | Status: DC | PRN
Start: 2020-05-24 — End: 2020-05-24

## 2020-05-24 MED ORDER — 0.9 % SODIUM CHLORIDE (POUR BTL) OPTIME
TOPICAL | Status: DC | PRN
  Administered 2020-05-24: 1000 mL

## 2020-05-24 MED ORDER — OXYCODONE HCL 5 MG PO TABS: 5.0000 mg | ORAL_TABLET | ORAL | Status: DC | PRN

## 2020-05-24 MED ORDER — PROPOFOL 1000 MG/100ML IV EMUL
INTRAVENOUS | Status: AC
  Filled 2020-05-24: qty 100

## 2020-05-24 MED ORDER — CEFAZOLIN SODIUM-DEXTROSE 2-4 GM/100ML-% IV SOLN
INTRAVENOUS | Status: AC
  Filled 2020-05-24: qty 100

## 2020-05-24 MED ORDER — SODIUM CHLORIDE 0.9 % IV SOLN
10.0000 mL/h | Freq: Once | INTRAVENOUS | Status: AC
  Administered 2020-05-24: 10 mL/h via INTRAVENOUS

## 2020-05-24 MED ORDER — SUCCINYLCHOLINE CHLORIDE 20 MG/ML IJ SOLN
INTRAMUSCULAR | Status: DC | PRN
  Administered 2020-05-24: 70 mg via INTRAVENOUS

## 2020-05-24 MED ORDER — FENTANYL CITRATE (PF) 250 MCG/5ML IJ SOLN
INTRAMUSCULAR | Status: AC
  Filled 2020-05-24: qty 5

## 2020-05-24 SURGICAL SUPPLY — 68 items
ADH SKN CLS APL DERMABOND .7 (GAUZE/BANDAGES/DRESSINGS) ×1
APL PRP STRL LF DISP 70% ISPRP (MISCELLANEOUS) ×1
BIT DRILL CALIBRATED 4.2 (BIT) IMPLANT
BIT DRILL SHORT 4.2 (BIT) IMPLANT
BNDG COHESIVE 4X5 TAN STRL (GAUZE/BANDAGES/DRESSINGS) ×2 IMPLANT
BNDG COHESIVE 6X5 TAN STRL LF (GAUZE/BANDAGES/DRESSINGS) ×1 IMPLANT
BNDG ELASTIC 4X5.8 VLCR STR LF (GAUZE/BANDAGES/DRESSINGS) ×2 IMPLANT
BNDG ELASTIC 6X5.8 VLCR STR LF (GAUZE/BANDAGES/DRESSINGS) ×2 IMPLANT
BRUSH SCRUB EZ PLAIN DRY (MISCELLANEOUS) ×4 IMPLANT
CHLORAPREP W/TINT 26 (MISCELLANEOUS) ×2 IMPLANT
COVER MAYO STAND STRL (DRAPES) ×2 IMPLANT
COVER SURGICAL LIGHT HANDLE (MISCELLANEOUS) ×4 IMPLANT
COVER WAND RF STERILE (DRAPES) ×2 IMPLANT
DERMABOND ADVANCED (GAUZE/BANDAGES/DRESSINGS) ×1
DERMABOND ADVANCED .7 DNX12 (GAUZE/BANDAGES/DRESSINGS) ×1 IMPLANT
DRAPE C-ARM 42X72 X-RAY (DRAPES) ×2 IMPLANT
DRAPE C-ARMOR (DRAPES) ×2 IMPLANT
DRAPE HALF SHEET 40X57 (DRAPES) ×4 IMPLANT
DRAPE IMP U-DRAPE 54X76 (DRAPES) ×4 IMPLANT
DRAPE INCISE IOBAN 66X45 STRL (DRAPES) ×2 IMPLANT
DRAPE ORTHO SPLIT 77X108 STRL (DRAPES) ×4
DRAPE SURG 17X23 STRL (DRAPES) ×1 IMPLANT
DRAPE SURG ORHT 6 SPLT 77X108 (DRAPES) ×2 IMPLANT
DRAPE U-SHAPE 47X51 STRL (DRAPES) ×2 IMPLANT
DRESSING MEPILEX FLEX 4X4 (GAUZE/BANDAGES/DRESSINGS) IMPLANT
DRILL BIT CALIBRATED 4.2 (BIT) ×2
DRILL BIT SHORT 4.2 (BIT) ×4
DRSG MEPILEX FLEX 4X4 (GAUZE/BANDAGES/DRESSINGS) ×10
DRSG MEPILEX SACRM 8.7X9.8 (GAUZE/BANDAGES/DRESSINGS) ×1 IMPLANT
ELECT REM PT RETURN 9FT ADLT (ELECTROSURGICAL) ×2
ELECTRODE REM PT RTRN 9FT ADLT (ELECTROSURGICAL) ×1 IMPLANT
GAUZE SPONGE 4X4 12PLY STRL (GAUZE/BANDAGES/DRESSINGS) ×2 IMPLANT
GLOVE BIO SURGEON STRL SZ 6.5 (GLOVE) ×6 IMPLANT
GLOVE BIO SURGEON STRL SZ7.5 (GLOVE) ×8 IMPLANT
GLOVE BIOGEL PI IND STRL 7.5 (GLOVE) ×1 IMPLANT
GLOVE BIOGEL PI INDICATOR 7.5 (GLOVE) ×1
GLOVE SURG UNDER POLY LF SZ6.5 (GLOVE) ×2 IMPLANT
GOWN STRL REUS W/ TWL LRG LVL3 (GOWN DISPOSABLE) ×2 IMPLANT
GOWN STRL REUS W/TWL LRG LVL3 (GOWN DISPOSABLE) ×4
GUIDEWIRE 3.2X400 (WIRE) ×1 IMPLANT
KIT BASIN OR (CUSTOM PROCEDURE TRAY) ×2 IMPLANT
KIT TURNOVER KIT B (KITS) ×2 IMPLANT
NAIL FEM RNFA 10X300 5D BEND (Nail) ×1 IMPLANT
PACK ORTHO EXTREMITY (CUSTOM PROCEDURE TRAY) ×2 IMPLANT
PAD ARMBOARD 7.5X6 YLW CONV (MISCELLANEOUS) ×4 IMPLANT
PADDING CAST ABS 4INX4YD NS (CAST SUPPLIES) ×1
PADDING CAST ABS 6INX4YD NS (CAST SUPPLIES) ×1
PADDING CAST ABS COTTON 4X4 ST (CAST SUPPLIES) IMPLANT
PADDING CAST ABS COTTON 6X4 NS (CAST SUPPLIES) IMPLANT
REAMER ROD DEEP FLUTE 2.5X950 (INSTRUMENTS) ×1 IMPLANT
SCREW LOCK IM 5X36 (Screw) ×2 IMPLANT
SCREW LOCK IM 5X38X125 (Screw) ×1 IMPLANT
SCREW LOCK IM 5X56X125 (Screw) ×1 IMPLANT
SCREW LOCK IM 5X76 (Screw) ×1 IMPLANT
SCREW LOCK IM NAIL 5X68 (Screw) ×4 IMPLANT
SCREW LOCK IM X25 68X5X STRL (Screw) IMPLANT
SCREW LOCK X25 36X5X IM NL (Screw) IMPLANT
SPONGE LAP 18X18 RF (DISPOSABLE) ×2 IMPLANT
STAPLER VISISTAT 35W (STAPLE) ×2 IMPLANT
SUT MNCRL AB 3-0 PS2 18 (SUTURE) ×2 IMPLANT
SUT VIC AB 0 CT1 27 (SUTURE) ×2
SUT VIC AB 0 CT1 27XBRD ANBCTR (SUTURE) IMPLANT
SUT VIC AB 2-0 CT1 27 (SUTURE) ×2
SUT VIC AB 2-0 CT1 TAPERPNT 27 (SUTURE) IMPLANT
TOWEL GREEN STERILE (TOWEL DISPOSABLE) ×4 IMPLANT
TOWEL GREEN STERILE FF (TOWEL DISPOSABLE) ×2 IMPLANT
TUBE CONNECTING 12X1/4 (SUCTIONS) ×2 IMPLANT
YANKAUER SUCT BULB TIP NO VENT (SUCTIONS) ×2 IMPLANT

## 2020-05-24 NOTE — Transfer of Care (Signed)
Immediate Anesthesia Transfer of Care Note  Patient: Denise [name]  Procedure(s) Performed: INTRAMEDULLARY (IM) RETROGRADE FEMORAL NAILING (Right Leg Upper)  Patient Location: PACU  Anesthesia Type:General  Level of Consciousness: awake, alert , oriented and patient cooperative  Airway & Oxygen Therapy: Patient Spontanous Breathing and Patient connected to nasal cannula oxygen  Post-op Assessment: Report given to RN and Post -op Vital signs reviewed and stable  Post vital signs: Reviewed and stable  Last Vitals:  Vitals Value Taken Time  BP    Temp    Pulse 70 05/24/20 1055  Resp 20 05/24/20 1055  SpO2 94 % 05/24/20 1055  Vitals shown include unvalidated device data.  Last Pain:  Vitals:   05/24/20 0745  TempSrc: Oral  PainSc:          Complications: No complications documented.

## 2020-05-24 NOTE — Consult Note (Signed)
Orthopaedic Trauma Service (OTS) Consult   Patient ID: [name] MRN: [id number] DOB/AGE: [birth year] 87 y.o.  Reason for Consult:Right periprosthetic distal femur fracture Referring Physician: Dr. Duwayne Heck, MD [facility]  HPI: Denise [name] is an 87 y.o. female who is being seen in consultation at the request of Dr. Aundria Rud for evaluation of right periprosthetic femur fracture.  The patient was in her usual state of health when she had her leg gave out from underneath her while she was at home.  Her son was there.  He states that she did not fall very hard.  But she had immediate pain and deformity and inability bear weight.  She was brought to [facility] emergency room where of the son requested transfer to [facility] as all of her surgeries and care has been received here.  Aundria Rud was called yesterday morning.  Due to the complexity of her injury he recommended treatment by orthopedic traumatologist.  Patient was seen and evaluated on 5 N.  She is resting comfortably upon arrival.  Her son is at bedside.  States that she ambulates with the use of a walker at home.  Has been complaining of some left leg pain.  But currently only complains of right leg pain.  She does have some potential memory issues.  Currently denies any numbness or tingling in her leg.  Unable to move it secondary to pain.  Past Medical History:  Diagnosis Date  . CHF (congestive heart failure) (HCC)   . Diabetes mellitus without complication (HCC)   . Hypertension   . LVH (left ventricular hypertrophy)   . Mitral regurgitation    mod to severe  . Permanent atrial fibrillation (HCC)   . Presence of permanent cardiac pacemaker   . Pulmonary hypertension (HCC)    severe  . Thyroid disease   . Tricuspid regurgitation    severe    Past Surgical History:  Procedure Laterality Date  . ABDOMINAL HYSTERECTOMY    . CARDIOVERSION  5 /18/ 2000   unsuccessful  . CHOLECYSTECTOMY    . COMPRESSION HIP SCREW  Right 06/06/2012   Procedure: Open Reduction Internal Fixation Right HIp;  Surgeon: Dannielle Huh, MD;  Location: [facility] OR;  Service: Orthopedics;  Laterality: Right;  . ESOPHAGOGASTRODUODENOSCOPY N/A 06/09/2015   Procedure: ESOPHAGOGASTRODUODENOSCOPY (EGD);  Surgeon: Charlott Rakes, MD;  Location: [facility] ENDOSCOPY;  Service: Endoscopy;  Laterality: N/A;  . knee replacement     R  . NM MYOCAR PERF WALL MOTION  02/28/2009   No signficant ischemia  . PACEMAKER INSERTION  05/26/2004   [facility]  . PPM GENERATOR CHANGEOUT N/A 08/17/2017   Procedure: PPM GENERATOR CHANGEOUT;  Surgeon: Thurmon Fair, MD;  Location: [facility] INVASIVE CV LAB;  Service: Cardiovascular;  Laterality: N/A;  . THYROIDECTOMY    . TUBAL LIGATION    . US ECHOCARDIOGRAPHY  02/25/2011   mod to severe MR,LA severe dilated,mild annular ca+,mod. TR,mod PI,mod ca+ of the aortic valve leaflet    Family History  Problem Relation Age of Onset  . Asthma Mother   . Heart attack Father   . Stroke Sister   . Heart attack Brother   . Cancer - Colon Brother   . Gallbladder disease Maternal Grandmother   . Stroke Maternal Grandfather   . Heart disease Child     Social History:  reports that she has never smoked. She has never used smokeless tobacco. She reports that she does not drink alcohol and does not use drugs.  Allergies:  Allergies  Allergen Reactions  . Iohexol Rash and Other (See Comments)     Desc: VOMITING-VERIFIED ON 05-24-04 PRE-PROCEDURE/ ARS   . Shrimp [Shellfish Allergy] Nausea And Vomiting and Rash    Medications:  No current facility-administered medications on file prior to encounter.   Current Outpatient Medications on File Prior to Encounter  Medication Sig Dispense Refill  . amLODipine (NORVASC) 2.5 MG tablet Take 1 tablet (2.5 mg total) by mouth every morning. 90 tablet 3  . atorvastatin (LIPITOR) 10 MG tablet TAKE ONE TABLET BY MOUTH EVERY EVENING (Patient taking differently: Take 10 mg by mouth at bedtime.) 90  tablet 2  . ELIQUIS 5 MG TABS tablet Take 5 mg by mouth 2 (two) times daily.    . furosemide (LASIX) 40 MG tablet Take one tablet (40mg ) by mouth daily except on Tuesday and Friday take 80 mg (2 tablets) (Patient taking differently: Take 40-80 mg by mouth See admin instructions. Take 1 tablet (40mg ) by mouth daily except on Tuesday and Friday take 1 tablet (40 mg totally) 2 times daily) 120 tablet 3  . hydrochlorothiazide (HYDRODIURIL) 25 MG tablet Take 0.5 tablets (12.5 mg total) by mouth every morning. 90 tablet 1  . isosorbide mononitrate (IMDUR) 30 MG 24 hr tablet TAKE 1 TABLET (30 MG TOTAL) BY MOUTH EVERY MORNING. (Patient taking differently: Take 30 mg by mouth daily.) 90 tablet 2  . methimazole (TAPAZOLE) 10 MG tablet TAKE 1/2 TABLET BY MOUTH ONCE DAILY (Patient taking differently: Take 5 mg by mouth daily.) 45 tablet 1  . metoprolol succinate (TOPROL-XL) 50 MG 24 hr tablet Take 1 tablet (50 mg total) by mouth daily. Take with or immediately following a meal. 90 tablet 1  . potassium chloride SA (KLOR-CON) 20 MEQ tablet Take 40 mEq daily except on Tuesday and Friday take 60 mEg (3 tablets) (Patient taking differently: Take 20-40 mEq by mouth See admin instructions. Take 1 tablet (20 mEq totally) by mouth 2 times daily; except on Tuesday and Friday take extra 1 tablet (20 mEq totally) by mouth in the evening) 64 tablet 0  . ramipril (ALTACE) 5 MG capsule TAKE 1 CAPSULE (5 MG TOTAL) BY MOUTH DAILY. 90 capsule 2    ROS: Constitutional: No fever or chills Vision: No changes in vision ENT: No difficulty swallowing CV: No chest pain Pulm: No SOB or wheezing GI: No nausea or vomiting GU: No urgency or inability to hold urine Skin: No poor wound healing Neurologic: No numbness or tingling Psychiatric: No depression or anxiety Heme: No bruising Allergic: No reaction to medications or food   Exam: Blood pressure (!) 113/50, pulse 69, temperature 98.3 F (36.8 C), temperature source Oral,  resp. rate 17, SpO2 97 %. General: No acute distress Orientation: Awake and alert Mood and Affect: Cooperative and pleasant Gait: Unable to assess due to her fracture Coordination and balance: Within normal limits  Right lower extremity: Knee immobilizer is in place is clean dry and intact.  Anterior knee incision for total knee arthroplasty is without any signs of infection.  Obvious deformity through the thigh.  Pain with any attempted range of motion.  Compartments are soft compressible.  Active dorsiflexion plantarflexion of the foot and toes.  Warm well-perfused foot.  Intact sensation to the dorsum and plantar aspect of the foot.  Left lower extremity skin without lesions. No tenderness to palpation. Full painless ROM, full strength in each muscle groups without evidence of instability.   Medical Decision Making: Data: Imaging: X-rays show  a interprosthetic distal femur fracture with notable comminution  Labs:  Results for orders placed or performed during the hospital encounter of 05/23/20 (from the past 24 hour(s))  Glucose, capillary     Status: Abnormal   Collection Time: 05/23/20 11:36 AM  Result Value Ref Range   Glucose-Capillary 111 (H) 70 - 99 mg/dL  Surgical pcr screen     Status: None   Collection Time: 05/23/20  1:24 PM   Specimen: Nasal Mucosa; Nasal Swab  Result Value Ref Range   MRSA, PCR NEGATIVE NEGATIVE   Staphylococcus aureus NEGATIVE NEGATIVE  TSH     Status: None   Collection Time: 05/23/20  1:55 PM  Result Value Ref Range   TSH 1.975 0.350 - 4.500 uIU/mL  T4, free     Status: None   Collection Time: 05/23/20  1:55 PM  Result Value Ref Range   Free T4 1.05 0.61 - 1.12 ng/dL  Reticulocytes     Status: Abnormal   Collection Time: 05/23/20  1:55 PM  Result Value Ref Range   Retic Ct Pct 1.7 0.4 - 3.1 %   RBC. 2.97 (L) 3.87 - 5.11 MIL/uL   Retic Count, Absolute 50.8 19.0 - 186.0 K/uL   Immature Retic Fract 17.7 (H) 2.3 - 15.9 %  Iron and TIBC      Status: Abnormal   Collection Time: 05/23/20  1:55 PM  Result Value Ref Range   Iron 16 (L) 28 - 170 ug/dL   TIBC 809 (L) 983 - 382 ug/dL   Saturation Ratios 10 (L) 10.4 - 31.8 %   UIBC 144 ug/dL  Ferritin     Status: None   Collection Time: 05/23/20  1:55 PM  Result Value Ref Range   Ferritin 189 11 - 307 ng/mL  Folate, serum, performed at [facility] lab     Status: None   Collection Time: 05/23/20  1:55 PM  Result Value Ref Range   Folate 12.7 >5.9 ng/mL  Vitamin B12     Status: None   Collection Time: 05/23/20  1:55 PM  Result Value Ref Range   Vitamin B-12 598 180 - 914 pg/mL  CBC     Status: Abnormal   Collection Time: 05/23/20  1:55 PM  Result Value Ref Range   WBC 8.6 4.0 - 10.5 K/uL   RBC 2.98 (L) 3.87 - 5.11 MIL/uL   Hemoglobin 8.0 (L) 12.0 - 15.0 g/dL   HCT 50.5 (L) 39.7 - 67.3 %   MCV 83.6 80.0 - 100.0 fL   MCH 26.8 26.0 - 34.0 pg   MCHC 32.1 30.0 - 36.0 g/dL   RDW 41.9 (H) 37.9 - 02.4 %   Platelets 378 150 - 400 K/uL   nRBC 0.0 0.0 - 0.2 %  Hemoglobin A1c     Status: Abnormal   Collection Time: 05/23/20  1:55 PM  Result Value Ref Range   Hgb A1c MFr Bld 5.9 (H) 4.8 - 5.6 %   Mean Plasma Glucose 122.63 mg/dL  VITAMIN D 25 Hydroxy (Vit-D Deficiency, Fractures)     Status: None   Collection Time: 05/23/20  1:55 PM  Result Value Ref Range   Vit D, 25-Hydroxy 58.77 30 - 100 ng/mL  Glucose, capillary     Status: Abnormal   Collection Time: 05/23/20  4:30 PM  Result Value Ref Range   Glucose-Capillary 111 (H) 70 - 99 mg/dL  Glucose, capillary     Status: Abnormal   Collection Time: 05/23/20 10:14  PM  Result Value Ref Range   Glucose-Capillary 138 (H) 70 - 99 mg/dL  Glucose, capillary     Status: Abnormal   Collection Time: 05/24/20  1:04 AM  Result Value Ref Range   Glucose-Capillary 113 (H) 70 - 99 mg/dL  CBC     Status: Abnormal   Collection Time: 05/24/20  2:15 AM  Result Value Ref Range   WBC 9.6 4.0 - 10.5 K/uL   RBC 2.65 (L) 3.87 - 5.11 MIL/uL    Hemoglobin 7.1 (L) 12.0 - 15.0 g/dL   HCT 73.4 (L) 28.7 - 68.1 %   MCV 84.9 80.0 - 100.0 fL   MCH 26.8 26.0 - 34.0 pg   MCHC 31.6 30.0 - 36.0 g/dL   RDW 15.7 (H) 26.2 - 03.5 %   Platelets 318 150 - 400 K/uL   nRBC 0.0 0.0 - 0.2 %  Basic metabolic panel     Status: Abnormal   Collection Time: 05/24/20  2:15 AM  Result Value Ref Range   Sodium 131 (L) 135 - 145 mmol/L   Potassium 3.9 3.5 - 5.1 mmol/L   Chloride 95 (L) 98 - 111 mmol/L   CO2 26 22 - 32 mmol/L   Glucose, Bld 120 (H) 70 - 99 mg/dL   BUN 38 (H) 8 - 23 mg/dL   Creatinine, Ser 5.97 (H) 0.44 - 1.00 mg/dL   Calcium 8.2 (L) 8.9 - 10.3 mg/dL   GFR, Estimated 33 (L) >60 mL/min   Anion gap 10 5 - 15  Magnesium     Status: None   Collection Time: 05/24/20  2:15 AM  Result Value Ref Range   Magnesium 1.8 1.7 - 2.4 mg/dL  Glucose, capillary     Status: Abnormal   Collection Time: 05/24/20  3:18 AM  Result Value Ref Range   Glucose-Capillary 119 (H) 70 - 99 mg/dL  Glucose, capillary     Status: Abnormal   Collection Time: 05/24/20  6:05 AM  Result Value Ref Range   Glucose-Capillary 121 (H) 70 - 99 mg/dL  Prepare RBC (crossmatch)     Status: None   Collection Time: 05/24/20  6:47 AM  Result Value Ref Range   Order Confirmation      ORDER PROCESSED BY BLOOD BANK Performed at [facility], [address], [place], [place] [address]   Prepare RBC (crossmatch)     Status: None   Collection Time: 05/24/20  6:47 AM  Result Value Ref Range   Order Confirmation      ORDER PROCESSED BY BLOOD BANK DUPLICATE REQUEST Performed at [facility], [address] [place], [place] [address]     Imaging or Labs ordered: None  Medical history and chart was reviewed and case discussed with medical provider.  Assessment/Plan: 87 year old female with multiple medical history including hypertension, cardiomyopathy, heart block with pacer, hypothyroidism, atrial fibrillation on Eliquis, chronic kidney disease, diabetes and  possible dementia with a right periprosthetic distal femur fracture.  Due to the unstable nature of her injury I recommend proceeding with intramedullary nailing with partial hardware removal.  Risks and benefits were discussed with the patient's son.  Risks include but not limited to bleeding, infection, malunion, nonunion, hardware failure, hardware irritation, nerve or blood vessel injury, DVT, even the possibility anesthetic complications.  The patient's son agreed to proceed with surgery.  Roby Lofts, MD [facility] [phone] (office) [email or web address]

## 2020-05-24 NOTE — Anesthesia Procedure Notes (Signed)
Procedure Name: Intubation Date/Time: 05/24/2020 9:18 AM Performed by: Lannie Fields, DO Pre-anesthesia Checklist: Patient identified, Emergency Drugs available, Suction available and Patient being monitored Patient Re-evaluated:Patient Re-evaluated prior to induction Oxygen Delivery Method: Circle system utilized Preoxygenation: Pre-oxygenation with 100% oxygen Induction Type: IV induction Ventilation: Mask ventilation without difficulty Laryngoscope Size: Miller and 3 Grade View: Grade I Tube type: Oral Tube size: 7.5 mm Number of attempts: 1 Airway Equipment and Method: Stylet and Oral airway Placement Confirmation: ETT inserted through vocal cords under direct vision,  positive ETCO2 and breath sounds checked- equal and bilateral Secured at: 23 cm Tube secured with: Tape Dental Injury: Teeth and Oropharynx as per pre-operative assessment

## 2020-05-24 NOTE — Progress Notes (Signed)
[facility] Progress Note  Patient: Denise [name]    WIO:[id number]  DOA: 05/23/2020     Date of Service: the patient was seen and examined on 05/24/2020  Brief hospital course: Past medical history of HTN, PPM implant, hypothyroidism, COPD on chronic respiratory failure with 2 LPM, A. fib on Eliquis, CKD 3A, type II DM, dementia.  Presents with a fall leading to right leg pain found to have distant femur fracture. Currently plan is monitor postop recovery.  Assessment and Plan: 1. Right femur fracture  - Presented to [facility] ED in [place] after she was found down at home and had x-rays concerning for mildly displaced right femoral shaft fracture  Orthopedics were consulted. Intern trauma service from orthopedics were also was consulted. Patient underwent surgery on 3/11. Tolerated procedure very well.  2. ?Upper GI bleeding  Dilutional anemia. - Patient's daughter reported that emesis in ED looked like dark blood  - Check stool or emesis for occult blood, so far no bleeding reported. No vomiting seen here as well. Require 1 PRBC on 3/11 before surgery, although suspect this is secondary to dilution.  3. Atrial fibrillation  - CHADS-VASc at least 9 (age x2, CHF, HTN, DM, gender)  - Hold Eliquis, resume when cleared by orthopedics.  4. Non-ischemic cardiomyopathy  - EF had normalized on most recent echo in 2019 at which time she had mild LVH, severe LAE, mild AI, mild MR, and moderate TR - She appears euvolemic  - Hold diuretics   5. Complete heart block  - Pacer interrogated in January 2021, had normal device function    6. Type II DM   - Check CBGs and use a low-intensity SSI for now    7. CKD IIIa  - SCr was 1.00 in ED which appears to be her baseline - Renally-dose medications, monitor   8. Hypertension  - BP at goal, treat as-needed only for now    9. Chronic hypoxic respiratory failure  - Appears stable on her usual 2 Lpm   10. Dementia   - Per family the patient is disoriented at baseline, walks with a walker, and is able to feed, dress, and toilet on her own   Diet: Regular diet DVT Prophylaxis:   SCDs Start: 05/24/20 1244 SCDs Start: 05/23/20 4268    Advance goals of care discussion: DNR  Family Communication: no family was present at bedside, at the time of interview.   Disposition:  Status is: Inpatient  Remains inpatient appropriate because:Inpatient level of care appropriate due to severity of illness  Dispo: The patient is from: Home              Anticipated d/c is to: SNF              Patient currently is not medically stable to d/c.   Difficult to place patient No  Subjective: No nausea no vomiting no fever no chills.  Seen after the surgery.  Reports pain.  Physical Exam:  General: Appear in mild distress, no Rash; Oral Mucosa Clear, moist. no Abnormal Neck Mass Or lumps, Conjunctiva normal  Cardiovascular: S1 and S2 Present, no Murmur, Respiratory: good respiratory effort, Bilateral Air entry present and CTA, no Crackles, no wheezes Abdomen: Bowel Sound present, Soft and no tenderness Extremities: no Pedal edema Neurology: alert and oriented to time, place, and person affect appropriate. no new focal deficit Gait not checked due to patient safety concerns  Vitals:   05/24/20 1138 05/24/20 1208 05/24/20 1250 05/24/20  1500  BP: (!) 108/50 113/64 (!) 107/42 (!) 101/45  Pulse: 69 71 70 70  Resp: (!) 22 (!) 21 17 17   Temp: (!) 97.1 F (36.2 C)  (!) 97.5 F (36.4 C) (!) 97.5 F (36.4 C)  TempSrc:   Oral Oral  SpO2: 95% 98% 95% 95%    Intake/Output Summary (Last 24 hours) at 05/24/2020 1935 Last data filed at 05/24/2020 1700 Gross per 24 hour  Intake 1497.81 ml  Output 320 ml  Net 1177.81 ml   There were no vitals filed for this visit.  Data Reviewed: I have personally reviewed and interpreted daily labs, tele strips, imaging. I reviewed all nursing notes, pharmacy notes, vitals,  pertinent old records I have discussed plan of care as described above with RN and patient/family.  CBC: Recent Labs  Lab 05/23/20 0627 05/23/20 1355 05/24/20 0215 05/24/20 1059  WBC 10.0 8.6 9.6 11.5*  HGB 8.2* 8.0* 7.1* 8.7*  HCT 26.6* 24.9* 22.5* 26.7*  MCV 84.4 83.6 84.9 86.1  PLT 367 378 318 302   Basic Metabolic Panel: Recent Labs  Lab 05/23/20 0627 05/24/20 0215  NA 134* 131*  K 4.0 3.9  CL 96* 95*  CO2 31 26  GLUCOSE 119* 120*  BUN 34* 38*  CREATININE 1.26* 1.46*  CALCIUM 8.5* 8.2*  MG  --  1.8    Studies: DG C-Arm 1-60 Min  Result Date: 05/24/2020 CLINICAL DATA:  Right femoral nail rib. EXAM: DG C-ARM 1-60 MIN; RIGHT FEMUR 2 VIEWS FLUOROSCOPY TIME:  Fluoroscopy Time:  1 minutes 41 seconds. Radiation Exposure Index (if provided by the fluoroscopic device): 9.72 mGy. Number of Acquired Spot Images: 7 COMPARISON:  May 23, 2020. FINDINGS: Seven C-arm fluoroscopic images were obtained intraoperatively and submitted for post operative interpretation. These images demonstrate nail and screw fixation of a spiral fracture of the distal femur. Please see the performing provider's procedural report for further detail. IMPRESSION: Intraoperative fluoroscopy, as detailed above. Electronically Signed   By: May 25, 2020 MD   On: 05/24/2020 11:49   DG FEMUR, MIN 2 VIEWS RIGHT  Result Date: 05/24/2020 CLINICAL DATA:  Right femoral nail rib. EXAM: DG C-ARM 1-60 MIN; RIGHT FEMUR 2 VIEWS FLUOROSCOPY TIME:  Fluoroscopy Time:  1 minutes 41 seconds. Radiation Exposure Index (if provided by the fluoroscopic device): 9.72 mGy. Number of Acquired Spot Images: 7 COMPARISON:  May 23, 2020. FINDINGS: Seven C-arm fluoroscopic images were obtained intraoperatively and submitted for post operative interpretation. These images demonstrate nail and screw fixation of a spiral fracture of the distal femur. Please see the performing provider's procedural report for further detail. IMPRESSION:  Intraoperative fluoroscopy, as detailed above. Electronically Signed   By: May 25, 2020 MD   On: 05/24/2020 11:49   DG FEMUR PORT, MIN 2 VIEWS RIGHT  Result Date: 05/24/2020 CLINICAL DATA:  Status post right femur intramedullary nail EXAM: RIGHT FEMUR PORTABLE 2 VIEW COMPARISON:  May 23, 2020. FINDINGS: Status post intramedullary nail and screw fixation of a spiral fracture of the distal femur. Redemonstrated proximal right femur ORIF with lateral plate, interlocking dynamic hip screw. Interval removal of the proximal femoral screws seen on the prior. No evidence of immediate hardware complication. Approximately 7 mm of medial displacement and some distraction of the femur fracture fragment. Total knee arthroplasty. Vascular calcifications. No evidence of interval fracture. No hip or knee dislocation. IMPRESSION: ORIF of a distal spiral femur fracture, as detailed above. Electronically Signed   By: May 25, 2020 MD   On:  05/24/2020 11:55    Scheduled Meds: . atorvastatin  10 mg Oral QPM  . Chlorhexidine Gluconate Cloth  6 each Topical Q0600  . docusate sodium  100 mg Oral BID  . feeding supplement (GLUCERNA SHAKE)  237 mL Oral TID BM  . insulin aspart  0-6 Units Subcutaneous Q4H  . isosorbide mononitrate  30 mg Oral Daily  . methimazole  5 mg Oral Daily  . metoprolol succinate  25 mg Oral Daily  . pantoprazole (PROTONIX) IV  40 mg Intravenous Q12H   Continuous Infusions: . 0.9 % NaCl with KCl 20 mEq / L 10 mL/hr at 05/24/20 1311  .  ceFAZolin (ANCEF) IV 2 g (05/24/20 1322)   PRN Meds: acetaminophen **OR** acetaminophen, fentaNYL (SUBLIMAZE) injection, labetalol, metoCLOPramide **OR** metoCLOPramide (REGLAN) injection, ondansetron **OR** ondansetron (ZOFRAN) IV, oxyCODONE, polyethylene glycol, senna-docusate  Time spent: 35 minutes  Author: Lynden Oxford, MD [facility] 05/24/2020 7:35 PM  To reach On-call, see care teams to locate the attending and reach out via  [email or web address]. Between 7PM-7AM, please contact night-coverage If you still have difficulty reaching the attending provider, please page the [facility] (Director on Call) for [facility] on amion for assistance.

## 2020-05-24 NOTE — Plan of Care (Signed)
Patient more disoriented now than she was on admission. Patient removed one IV site, new 22g placed in right FA. Patient resting well, will continue to monitor.   Problem: Education: Goal: Knowledge of General Education information will improve Description: Including pain rating scale, medication(s)/side effects and non-pharmacologic comfort measures 05/24/2020 0046 by Darleene Cleaver, RN Outcome: Progressing Problem: Activity: Goal: Risk for activity intolerance will decrease 05/24/2020 0046 by Darleene Cleaver, RN Outcome: Progressing   Problem: Pain Managment: Goal: General experience of comfort will improve 05/24/2020 0046 by Darleene Cleaver, RN Outcome: Progressing   Problem: Safety: Goal: Ability to remain free from injury will improve 05/24/2020 0046 by Darleene Cleaver, RN Outcome: Progressing  Problem: Skin Integrity: Goal: Risk for impaired skin integrity will decrease 05/24/2020 0046 by Darleene Cleaver, RN Outcome: Progressing

## 2020-05-24 NOTE — Interval H&P Note (Signed)
History and Physical Interval Note:  05/24/2020 8:35 AM  Bergen T Halder  has presented today for surgery, with the diagnosis of Right periprosthetic femur fracture.  The various methods of treatment have been discussed with the patient and family. After consideration of risks, benefits and other options for treatment, the patient has consented to  Procedure(s): INTRAMEDULLARY (IM) RETROGRADE FEMORAL NAILING (Right) as a surgical intervention.  The patient's history has been reviewed, patient examined, no change in status, stable for surgery.  I have reviewed the patient's chart and labs.  Questions were answered to the patient's satisfaction.     Caryn Bee P Nikelle Malatesta

## 2020-05-24 NOTE — Anesthesia Preprocedure Evaluation (Addendum)
Anesthesia Evaluation  Patient identified by MRN, date of birth, ID band Patient awake    Reviewed: Allergy & Precautions, NPO status , Patient's Chart, lab work & pertinent test results  Airway Mallampati: I  TM Distance: >3 FB Neck ROM: Full    Dental no notable dental hx. (+) Missing, Poor Dentition, Dental Advisory Given   Pulmonary  chronic 2 L/min supplemental oxygen requirement   Pulmonary exam normal breath sounds clear to auscultation       Cardiovascular hypertension, Pt. on medications +CHF  Normal cardiovascular exam+ dysrhythmias (eliquis- LD 3/9) Atrial Fibrillation + pacemaker + Valvular Problems/Murmurs (mild AS, mild MR, mod TR) AS and MR  Rhythm:Regular Rate:Normal  Echo 2019: - Left ventricle: Wall thickness was increased in a pattern of mild  LVH. Systolic function was normal. The estimated ejection  fraction was in the range of 50% to 55%. The study is not  technically sufficient to allow evaluation of LV diastolic  function.  - Aortic valve: There was mild stenosis. There was trivial  regurgitation.  - Mitral valve: Moderately calcified annulus. Moderately thickened,  mildly calcified leaflets . There was mild regurgitation.  - Left atrium: The atrium was severely dilated.  - Right ventricle: The cavity size was mildly dilated.  - Right atrium: The atrium was severely dilated.  - Atrial septum: No defect or patent foramen ovale was identified.  - Tricuspid valve: There was moderate regurgitation.    Neuro/Psych negative neurological ROS  negative psych ROS   GI/Hepatic Possible UGIB? Per pts daughter, emesis in ED looked bloody   Endo/Other  diabetes, Well Controlled, Type 2a1c 5.9  Renal/GU Renal InsufficiencyRenal diseaseCr 1.46, CKD 3  negative genitourinary   Musculoskeletal Right periprosthetic femur fx   Abdominal   Peds  Hematology  (+) Blood dyscrasia, anemia , H/H  7.1/22.5, plt 318   Anesthesia Other Findings   Reproductive/Obstetrics negative OB ROS                            Anesthesia Physical Anesthesia Plan  ASA: IV  Anesthesia Plan: General   Post-op Pain Management:    Induction: Intravenous  PONV Risk Score and Plan: Ondansetron, Dexamethasone and Treatment may vary due to age or medical condition  Airway Management Planned: Oral ETT  Additional Equipment: None  Intra-op Plan:   Post-operative Plan: Extubation in OR and Possible Post-op intubation/ventilation  Informed Consent: I have reviewed the patients History and Physical, chart, labs and discussed the procedure including the risks, benefits and alternatives for the proposed anesthesia with the patient or authorized representative who has indicated his/her understanding and acceptance.   Patient has DNR.  Discussed DNR with patient, Discussed DNR with power of attorney and Suspend DNR.   Dental advisory given and Consent reviewed with POA  Plan Discussed with: CRNA  Anesthesia Plan Comments: (Only 48h off eliquis- will proceed w/ GA D/w son suspending DNR)       Anesthesia Quick Evaluation

## 2020-05-24 NOTE — Op Note (Signed)
Orthopaedic Surgery Operative Note (CSN: [id number] ) Date of Surgery: 05/24/2020  Admit Date: 05/23/2020   Diagnoses: Pre-Op Diagnoses: Right periprosthetic femur fracture   Post-Op Diagnosis: Same  Procedures: 1. CPT 27506-Retrograde intramedullary nailing of right femur fracture 2. CPT 20680-Removal of hardware right femur   Surgeons : Primary: Roby Lofts, MD  Assistant: Ulyses Southward, PA-C  Location: OR 3   Anesthesia:General  Antibiotics: Ancef 2g preop with 1 gm vancomycin powder   Tourniquet time:None    Estimated Blood Loss:20 mL  Complications:None   Specimens:None   Implants: Implant Name Type Inv. Item Serial No. Manufacturer Lot No. LRB No. Used Action  retrograde femoral nail, RNFA 10 mm x  300 mm, 5 degree bend Nail   SYNTHES TRAUMA [id number] Right 1 Implanted  SCREW LOCK IM NL 5X76 - [id number] Screw SCREW LOCK IM NL 5X76  [employer]  Right 1 Implanted  screw. 5.0 x 56 mm Screw   [employer]  Right 1 Implanted  screw 5.0 x 68mm Screw   [employer]  Right 1 Implanted  SCREW CORTEX 4.5 214.840 - [id number] Screw SCREW CORTEX 4.5  [employer]  Right 2 Explanted  SCREW CORTEX 4.5 214.836 - [id number] Screw SCREW CORTEX 4.5  [employer]  Right 2 Explanted  SCREW LOCK IM 5X36 - [id number] Screw SCREW LOCK IM 5X36  DEPUY SYNTHES  Right 1 Implanted  SCREW LOCK IM [id number] - [id number] Screw SCREW LOCK IM [id number]  DEPU[employer] ORTHOPA[employer]  Right 1 Implanted     Indications for Surgery: 87 year old female who sustained a ground-level fall.  She has a right periprosthetic femur fracture between the total knee and a previous DHS plate and screws.  Due to the unstable nature of her injury I recommend proceeding to the operating room for removal of hardware and retrograde intramedullary nailing of right femur.  Risks and benefits were discussed with the patient and her son.  Risks included but not limited to bleeding, infection, malunion, nonunion, hardware  failure, hardware irritation, nerve or blood vessel injury, DVT, even possibility anesthetic complications.  They agreed to proceed with surgery and consent was obtained.  Operative Findings: 1.  Removal of femoral shaft screws from the right hip DHS. 2.  Retrograde intramedullary nailing with [employer] RNFA 10 x 300 mm with 4 distal interlocking screws and 2 proximal interlocking screws  Procedure: The patient was identified in the preoperative holding area. Consent was confirmed with the patient and their family and all questions were answered. The operative extremity was marked after confirmation with the patient. she was then brought back to the operating room by our anesthesia colleagues.  She was placed under general anesthetic and carefully transferred over to a radiolucent flat top table.  A bump was placed under her operative hip.  The right lower extremity was then prepped and draped in usual sterile fashion.  A timeout was performed to verify the patient, the procedure, and the extremity.  Preoperative antibiotics were dosed.  Fluoroscopic imaging was obtained to show the unstable nature of her injury.  I have reopened her lateral hip incision for where the DHS was inserted.  I proceeded to split the IT band in line with my incision and split the muscle as well.  I exposed the distal portion of the plate and remove the 4 nonlocking screws that were in place.  These came out without difficulty.  I then made a small incision along the length of her previous total knee incision.  I  made a capsulotomy just medial to the patella tendon.  I then entered the knee joint.  I then used fluoroscopy as a guide to place a guidewire in the box of the knee replacement.  I then used an entry reamer to enter the medullary canal.  A ball-tipped guidewire was placed on the center of the canal and seated into the proximal segment just distal to the lag screw of the DHS.  I then measured the length and chose to use a  300 mm nail.  I then passed a 10 mm reamer and 11 mm reamer got just a touch of chatter.  I then passed a 10 x 300 mm nail down the center of the canal.  It was buried on the lateral view of the knee.  I then used the targeting arm to place 4 distal interlocking screws.  I used perfect circle technique to place 2 anterior to posterior proximal interlocking screws.  Fluoroscopic imaging was obtained for final radiographs.  The incisions were then copiously irrigated.  A gram of vancomycin powder was placed in the incision.  A layer closure of 0 Vicryl, 2-0 Vicryl, 3-0 Monocryl and Dermabond was used to close the skin and sterile dressings were placed.  The patient was then awoken from anesthesia and taken to the PACU in stable condition.   Post Op Plan/Instructions: Patient will weight-bear for transfers to the right lower extremity.  She will receive postoperative Ancef.  She will receive her Eliquis if her hemoglobin is stabilized tomorrow.  We will mobilize her with physical and Occupational Therapy.  I was present and performed the entire surgery.  Ulyses Southward, PA-C did assist me throughout the case. An assistant was necessary given the difficulty in approach, maintenance of reduction and ability to instrument the fracture.   Truitt Merle, MD [facility]

## 2020-05-24 NOTE — TOC CAGE-AID Note (Signed)
Transition of Care [facility]) - CAGE-AID Screening   Patient Details  Name: Denise [name] MRN: [id number] Date of Birth: Oct 23, 1923   Hewitt Shorts, RN Phone Number: 05/24/2020, 9:40 AM      CAGE-AID Screening: Substance Abuse Screening unable to be completed due to: : Patient unable to participate

## 2020-05-24 NOTE — Progress Notes (Signed)
Pt arrived from floor to short stay.  Pt currently vomiting small amount of bile/dry heaving.  Dr. Salvadore Farber aware--do not give beta blocker.  Made aware of vitals, currently 124/20, pulse 70.  Son, power of attorney at bedside.

## 2020-05-24 NOTE — H&P (View-Only) (Signed)
Orthopaedic Trauma Service (OTS) Consult   Patient ID: Denise [name] MRN: [id number] DOB/AGE: [birth year] 87 y.o.  Reason for Consult:Right periprosthetic distal femur fracture Referring Physician: Dr. Duwayne Heck, MD [facility]  HPI: Denise [name] is an 87 y.o. female who is being seen in consultation at the request of Dr. Aundria Rud for evaluation of right periprosthetic femur fracture.  The patient was in her usual state of health when she had her leg gave out from underneath her while she was at home.  Her son was there.  He states that she did not fall very hard.  But she had immediate pain and deformity and inability bear weight.  She was brought to [facility] emergency room where of the son requested transfer to [facility] as all of her surgeries and care has been received here.  Aundria Rud was called yesterday morning.  Due to the complexity of her injury he recommended treatment by orthopedic traumatologist.  Patient was seen and evaluated on 5 N.  She is resting comfortably upon arrival.  Her son is at bedside.  States that she ambulates with the use of a walker at home.  Has been complaining of some left leg pain.  But currently only complains of right leg pain.  She does have some potential memory issues.  Currently denies any numbness or tingling in her leg.  Unable to move it secondary to pain.  Past Medical History:  Diagnosis Date  . CHF (congestive heart failure) (HCC)   . Diabetes mellitus without complication (HCC)   . Hypertension   . LVH (left ventricular hypertrophy)   . Mitral regurgitation    mod to severe  . Permanent atrial fibrillation (HCC)   . Presence of permanent cardiac pacemaker   . Pulmonary hypertension (HCC)    severe  . Thyroid disease   . Tricuspid regurgitation    severe    Past Surgical History:  Procedure Laterality Date  . ABDOMINAL HYSTERECTOMY    . CARDIOVERSION  5 /18/ 2000   unsuccessful  . CHOLECYSTECTOMY    . COMPRESSION HIP SCREW  Right 06/06/2012   Procedure: Open Reduction Internal Fixation Right HIp;  Surgeon: Dannielle Huh, MD;  Location: [facility] OR;  Service: Orthopedics;  Laterality: Right;  . ESOPHAGOGASTRODUODENOSCOPY N/A 06/09/2015   Procedure: ESOPHAGOGASTRODUODENOSCOPY (EGD);  Surgeon: Charlott Rakes, MD;  Location: [facility] ENDOSCOPY;  Service: Endoscopy;  Laterality: N/A;  . knee replacement     R  . NM MYOCAR PERF WALL MOTION  02/28/2009   No signficant ischemia  . PACEMAKER INSERTION  05/26/2004   [facility]  . PPM GENERATOR CHANGEOUT N/A 08/17/2017   Procedure: PPM GENERATOR CHANGEOUT;  Surgeon: Thurmon Fair, MD;  Location: [facility] INVASIVE CV LAB;  Service: Cardiovascular;  Laterality: N/A;  . THYROIDECTOMY    . TUBAL LIGATION    . US ECHOCARDIOGRAPHY  02/25/2011   mod to severe MR,LA severe dilated,mild annular ca+,mod. TR,mod PI,mod ca+ of the aortic valve leaflet    Family History  Problem Relation Age of Onset  . Asthma Mother   . Heart attack Father   . Stroke Sister   . Heart attack Brother   . Cancer - Colon Brother   . Gallbladder disease Maternal Grandmother   . Stroke Maternal Grandfather   . Heart disease Child     Social History:  reports that she has never smoked. She has never used smokeless tobacco. She reports that she does not drink alcohol and does not use drugs.  Allergies:  Allergies  Allergen Reactions  . Iohexol Rash and Other (See Comments)     Desc: VOMITING-VERIFIED ON 05-24-04 PRE-PROCEDURE/ ARS   . Shrimp [Shellfish Allergy] Nausea And Vomiting and Rash    Medications:  No current facility-administered medications on file prior to encounter.   Current Outpatient Medications on File Prior to Encounter  Medication Sig Dispense Refill  . amLODipine (NORVASC) 2.5 MG tablet Take 1 tablet (2.5 mg total) by mouth every morning. 90 tablet 3  . atorvastatin (LIPITOR) 10 MG tablet TAKE ONE TABLET BY MOUTH EVERY EVENING (Patient taking differently: Take 10 mg by mouth at bedtime.) 90  tablet 2  . ELIQUIS 5 MG TABS tablet Take 5 mg by mouth 2 (two) times daily.    . furosemide (LASIX) 40 MG tablet Take one tablet (40mg) by mouth daily except on Tuesday and Friday take 80 mg (2 tablets) (Patient taking differently: Take 40-80 mg by mouth See admin instructions. Take 1 tablet (40mg) by mouth daily except on Tuesday and Friday take 1 tablet (40 mg totally) 2 times daily) 120 tablet 3  . hydrochlorothiazide (HYDRODIURIL) 25 MG tablet Take 0.5 tablets (12.5 mg total) by mouth every morning. 90 tablet 1  . isosorbide mononitrate (IMDUR) 30 MG 24 hr tablet TAKE 1 TABLET (30 MG TOTAL) BY MOUTH EVERY MORNING. (Patient taking differently: Take 30 mg by mouth daily.) 90 tablet 2  . methimazole (TAPAZOLE) 10 MG tablet TAKE 1/2 TABLET BY MOUTH ONCE DAILY (Patient taking differently: Take 5 mg by mouth daily.) 45 tablet 1  . metoprolol succinate (TOPROL-XL) 50 MG 24 hr tablet Take 1 tablet (50 mg total) by mouth daily. Take with or immediately following a meal. 90 tablet 1  . potassium chloride SA (KLOR-CON) 20 MEQ tablet Take 40 mEq daily except on Tuesday and Friday take 60 mEg (3 tablets) (Patient taking differently: Take 20-40 mEq by mouth See admin instructions. Take 1 tablet (20 mEq totally) by mouth 2 times daily; except on Tuesday and Friday take extra 1 tablet (20 mEq totally) by mouth in the evening) 64 tablet 0  . ramipril (ALTACE) 5 MG capsule TAKE 1 CAPSULE (5 MG TOTAL) BY MOUTH DAILY. 90 capsule 2    ROS: Constitutional: No fever or chills Vision: No changes in vision ENT: No difficulty swallowing CV: No chest pain Pulm: No SOB or wheezing GI: No nausea or vomiting GU: No urgency or inability to hold urine Skin: No poor wound healing Neurologic: No numbness or tingling Psychiatric: No depression or anxiety Heme: No bruising Allergic: No reaction to medications or food   Exam: Blood pressure (!) 113/50, pulse 69, temperature 98.3 F (36.8 C), temperature source Oral,  resp. rate 17, SpO2 97 %. General: No acute distress Orientation: Awake and alert Mood and Affect: Cooperative and pleasant Gait: Unable to assess due to her fracture Coordination and balance: Within normal limits  Right lower extremity: Knee immobilizer is in place is clean dry and intact.  Anterior knee incision for total knee arthroplasty is without any signs of infection.  Obvious deformity through the thigh.  Pain with any attempted range of motion.  Compartments are soft compressible.  Active dorsiflexion plantarflexion of the foot and toes.  Warm well-perfused foot.  Intact sensation to the dorsum and plantar aspect of the foot.  Left lower extremity skin without lesions. No tenderness to palpation. Full painless ROM, full strength in each muscle groups without evidence of instability.   Medical Decision Making: Data: Imaging: X-rays show   a interprosthetic distal femur fracture with notable comminution  Labs:  Results for orders placed or performed during the hospital encounter of 05/23/20 (from the past 24 hour(s))  Glucose, capillary     Status: Abnormal   Collection Time: 05/23/20 11:36 AM  Result Value Ref Range   Glucose-Capillary 111 (H) 70 - 99 mg/dL  Surgical pcr screen     Status: None   Collection Time: 05/23/20  1:24 PM   Specimen: Nasal Mucosa; Nasal Swab  Result Value Ref Range   MRSA, PCR NEGATIVE NEGATIVE   Staphylococcus aureus NEGATIVE NEGATIVE  TSH     Status: None   Collection Time: 05/23/20  1:55 PM  Result Value Ref Range   TSH 1.975 0.350 - 4.500 uIU/mL  T4, free     Status: None   Collection Time: 05/23/20  1:55 PM  Result Value Ref Range   Free T4 1.05 0.61 - 1.12 ng/dL  Reticulocytes     Status: Abnormal   Collection Time: 05/23/20  1:55 PM  Result Value Ref Range   Retic Ct Pct 1.7 0.4 - 3.1 %   RBC. 2.97 (L) 3.87 - 5.11 MIL/uL   Retic Count, Absolute 50.8 19.0 - 186.0 K/uL   Immature Retic Fract 17.7 (H) 2.3 - 15.9 %  Iron and TIBC      Status: Abnormal   Collection Time: 05/23/20  1:55 PM  Result Value Ref Range   Iron 16 (L) 28 - 170 ug/dL   TIBC 809 (L) 983 - 382 ug/dL   Saturation Ratios 10 (L) 10.4 - 31.8 %   UIBC 144 ug/dL  Ferritin     Status: None   Collection Time: 05/23/20  1:55 PM  Result Value Ref Range   Ferritin 189 11 - 307 ng/mL  Folate, serum, performed at [facility] lab     Status: None   Collection Time: 05/23/20  1:55 PM  Result Value Ref Range   Folate 12.7 >5.9 ng/mL  Vitamin B12     Status: None   Collection Time: 05/23/20  1:55 PM  Result Value Ref Range   Vitamin B-12 598 180 - 914 pg/mL  CBC     Status: Abnormal   Collection Time: 05/23/20  1:55 PM  Result Value Ref Range   WBC 8.6 4.0 - 10.5 K/uL   RBC 2.98 (L) 3.87 - 5.11 MIL/uL   Hemoglobin 8.0 (L) 12.0 - 15.0 g/dL   HCT 50.5 (L) 39.7 - 67.3 %   MCV 83.6 80.0 - 100.0 fL   MCH 26.8 26.0 - 34.0 pg   MCHC 32.1 30.0 - 36.0 g/dL   RDW 41.9 (H) 37.9 - 02.4 %   Platelets 378 150 - 400 K/uL   nRBC 0.0 0.0 - 0.2 %  Hemoglobin A1c     Status: Abnormal   Collection Time: 05/23/20  1:55 PM  Result Value Ref Range   Hgb A1c MFr Bld 5.9 (H) 4.8 - 5.6 %   Mean Plasma Glucose 122.63 mg/dL  VITAMIN D 25 Hydroxy (Vit-D Deficiency, Fractures)     Status: None   Collection Time: 05/23/20  1:55 PM  Result Value Ref Range   Vit D, 25-Hydroxy 58.77 30 - 100 ng/mL  Glucose, capillary     Status: Abnormal   Collection Time: 05/23/20  4:30 PM  Result Value Ref Range   Glucose-Capillary 111 (H) 70 - 99 mg/dL  Glucose, capillary     Status: Abnormal   Collection Time: 05/23/20 10:14  PM  Result Value Ref Range   Glucose-Capillary 138 (H) 70 - 99 mg/dL  Glucose, capillary     Status: Abnormal   Collection Time: 05/24/20  1:04 AM  Result Value Ref Range   Glucose-Capillary 113 (H) 70 - 99 mg/dL  CBC     Status: Abnormal   Collection Time: 05/24/20  2:15 AM  Result Value Ref Range   WBC 9.6 4.0 - 10.5 K/uL   RBC 2.65 (L) 3.87 - 5.11 MIL/uL    Hemoglobin 7.1 (L) 12.0 - 15.0 g/dL   HCT 73.4 (L) 28.7 - 68.1 %   MCV 84.9 80.0 - 100.0 fL   MCH 26.8 26.0 - 34.0 pg   MCHC 31.6 30.0 - 36.0 g/dL   RDW 15.7 (H) 26.2 - 03.5 %   Platelets 318 150 - 400 K/uL   nRBC 0.0 0.0 - 0.2 %  Basic metabolic panel     Status: Abnormal   Collection Time: 05/24/20  2:15 AM  Result Value Ref Range   Sodium 131 (L) 135 - 145 mmol/L   Potassium 3.9 3.5 - 5.1 mmol/L   Chloride 95 (L) 98 - 111 mmol/L   CO2 26 22 - 32 mmol/L   Glucose, Bld 120 (H) 70 - 99 mg/dL   BUN 38 (H) 8 - 23 mg/dL   Creatinine, Ser 5.97 (H) 0.44 - 1.00 mg/dL   Calcium 8.2 (L) 8.9 - 10.3 mg/dL   GFR, Estimated 33 (L) >60 mL/min   Anion gap 10 5 - 15  Magnesium     Status: None   Collection Time: 05/24/20  2:15 AM  Result Value Ref Range   Magnesium 1.8 1.7 - 2.4 mg/dL  Glucose, capillary     Status: Abnormal   Collection Time: 05/24/20  3:18 AM  Result Value Ref Range   Glucose-Capillary 119 (H) 70 - 99 mg/dL  Glucose, capillary     Status: Abnormal   Collection Time: 05/24/20  6:05 AM  Result Value Ref Range   Glucose-Capillary 121 (H) 70 - 99 mg/dL  Prepare RBC (crossmatch)     Status: None   Collection Time: 05/24/20  6:47 AM  Result Value Ref Range   Order Confirmation      ORDER PROCESSED BY BLOOD BANK Performed at [facility], [address], [place], [place] [address]   Prepare RBC (crossmatch)     Status: None   Collection Time: 05/24/20  6:47 AM  Result Value Ref Range   Order Confirmation      ORDER PROCESSED BY BLOOD BANK DUPLICATE REQUEST Performed at [facility], [address] [place], [place] [address]     Imaging or Labs ordered: None  Medical history and chart was reviewed and case discussed with medical provider.  Assessment/Plan: 87 year old female with multiple medical history including hypertension, cardiomyopathy, heart block with pacer, hypothyroidism, atrial fibrillation on Eliquis, chronic kidney disease, diabetes and  possible dementia with a right periprosthetic distal femur fracture.  Due to the unstable nature of her injury I recommend proceeding with intramedullary nailing with partial hardware removal.  Risks and benefits were discussed with the patient's son.  Risks include but not limited to bleeding, infection, malunion, nonunion, hardware failure, hardware irritation, nerve or blood vessel injury, DVT, even the possibility anesthetic complications.  The patient's son agreed to proceed with surgery.  Roby Lofts, MD [facility] [phone] (office) [email or web address]

## 2020-05-24 NOTE — Anesthesia Postprocedure Evaluation (Signed)
Anesthesia Post Note  Patient: Denise [name]  Procedure(s) Performed: INTRAMEDULLARY (IM) RETROGRADE FEMORAL NAILING (Right Leg Upper)     Patient location during evaluation: PACU Anesthesia Type: General Level of consciousness: awake and alert and oriented Pain management: pain level controlled Vital Signs Assessment: post-procedure vital signs reviewed and stable Respiratory status: spontaneous breathing, nonlabored ventilation and respiratory function stable Cardiovascular status: blood pressure returned to baseline Postop Assessment: no apparent nausea or vomiting Anesthetic complications: no   No complications documented.  Last Vitals:  Vitals:   05/24/20 1138 05/24/20 1208  BP: (!) 108/50 113/64  Pulse: 69 71  Resp: (!) 22 (!) 21  Temp: (!) 36.2 C   SpO2: 95% 98%    Last Pain:  Vitals:   05/24/20 1138  TempSrc:   PainSc: 0-No pain                 Kaylyn Layer

## 2020-05-25 ENCOUNTER — Inpatient Hospital Stay (HOSPITAL_COMMUNITY): Payer: Medicare Other

## 2020-05-25 DIAGNOSIS — S72341A Displaced spiral fracture of shaft of right femur, initial encounter for closed fracture: Secondary | ICD-10-CM | POA: Diagnosis not present

## 2020-05-25 LAB — GLUCOSE, CAPILLARY
Glucose-Capillary: 102 mg/dL — ABNORMAL HIGH (ref 70–99)
Glucose-Capillary: 103 mg/dL — ABNORMAL HIGH (ref 70–99)
Glucose-Capillary: 111 mg/dL — ABNORMAL HIGH (ref 70–99)
Glucose-Capillary: 117 mg/dL — ABNORMAL HIGH (ref 70–99)
Glucose-Capillary: 131 mg/dL — ABNORMAL HIGH (ref 70–99)

## 2020-05-25 LAB — BASIC METABOLIC PANEL
Anion gap: 8 (ref 5–15)
BUN: 39 mg/dL — ABNORMAL HIGH (ref 8–23)
CO2: 29 mmol/L (ref 22–32)
Calcium: 8.2 mg/dL — ABNORMAL LOW (ref 8.9–10.3)
Chloride: 97 mmol/L — ABNORMAL LOW (ref 98–111)
Creatinine, Ser: 1.41 mg/dL — ABNORMAL HIGH (ref 0.44–1.00)
GFR, Estimated: 35 mL/min — ABNORMAL LOW (ref >60)
Glucose, Bld: 114 mg/dL — ABNORMAL HIGH (ref 70–99)
Potassium: 3.9 mmol/L (ref 3.5–5.1)
Sodium: 134 mmol/L — ABNORMAL LOW (ref 135–145)

## 2020-05-25 LAB — CBC
HCT: 24.4 % — ABNORMAL LOW (ref 36.0–46.0)
Hemoglobin: 7.9 g/dL — ABNORMAL LOW (ref 12.0–15.0)
MCH: 28.4 pg (ref 26.0–34.0)
MCHC: 32.4 g/dL (ref 30.0–36.0)
MCV: 87.8 fL (ref 80.0–100.0)
Platelets: 286 10*3/uL (ref 150–400)
RBC: 2.78 MIL/uL — ABNORMAL LOW (ref 3.87–5.11)
RDW: 17.1 % — ABNORMAL HIGH (ref 11.5–15.5)
WBC: 9.5 10*3/uL (ref 4.0–10.5)
nRBC: 0 % (ref 0.0–0.2)

## 2020-05-25 LAB — VITAMIN D 25 HYDROXY (VIT D DEFICIENCY, FRACTURES): Vit D, 25-Hydroxy: 58.17 ng/mL (ref 30–100)

## 2020-05-25 LAB — MAGNESIUM: Magnesium: 1.6 mg/dL — ABNORMAL LOW (ref 1.7–2.4)

## 2020-05-25 MED ORDER — POLYETHYLENE GLYCOL 3350 17 G PO PACK
17.0000 g | PACK | Freq: Every day | ORAL | Status: DC
  Administered 2020-05-25 – 2020-05-26 (×2): 17 g via ORAL
  Filled 2020-05-25 (×2): qty 1

## 2020-05-25 MED ORDER — SENNOSIDES-DOCUSATE SODIUM 8.6-50 MG PO TABS
1.0000 | ORAL_TABLET | Freq: Two times a day (BID) | ORAL | Status: DC
  Administered 2020-05-25 – 2020-05-26 (×3): 1 via ORAL
  Filled 2020-05-25 (×3): qty 1

## 2020-05-25 MED ORDER — MAGNESIUM SULFATE 2 GM/50ML IV SOLN
2.0000 g | Freq: Once | INTRAVENOUS | Status: AC
  Administered 2020-05-25: 2 g via INTRAVENOUS
  Filled 2020-05-25: qty 50

## 2020-05-25 NOTE — Progress Notes (Addendum)
ORTHOPAEDIC PROGRESS NOTE  s/p Procedure(s): INTRAMEDULLARY (IM) RETROGRADE FEMORAL NAILING on 05/24/2020 with Dr. Jena Gauss  SUBJECTIVE: Reports mild pain about operative site. Resting comfortably in hospital bed.   OBJECTIVE: PE: General: resting comfortably in hospital bed, NAD RLE: dressing CDI. Able to wiggle her toes. Endorses distal sensation. Warm well perfused foot.   Vitals:   05/24/20 2048 05/25/20 0329  BP: 99/61 (!) 113/45  Pulse: 72 69  Resp: 16 16  Temp: 98 F (36.7 C) 98 F (36.7 C)  SpO2: 99% 99%     ASSESSMENT: Denise [name] is a 87 y.o. female POD#1  PLAN: Weightbearing: weight-bear for transfers to the right lower extremity Insicional and dressing care: Reinforce dressings as needed Orthopedic device(s): None VTE prophylaxis: Resume eliquis when Hgb stable ABLA: Hgb 7.9 today. Continue to monitor.  Pain control: PRN pain medications, preferring oral medications Follow - up plan: In office with Dr. Samson Frederic information: After hours and holidays please check [email or web address] for group call information for [facility] Dispo: TBD. PT/OT eval pending. Likely to discharge back to SNF once bed available   Alfonse Alpers, PA-C 05/25/2020

## 2020-05-25 NOTE — Progress Notes (Signed)
[facility] Progress Note  Patient: Denise [name]    VCB:[id number]  DOA: 05/23/2020     Date of Service: the patient was seen and examined on 05/25/2020  Brief hospital course: Past medical history of HTN, PPM implant, hypothyroidism, COPD on chronic respiratory failure with 2 LPM, A. fib on Eliquis, CKD 3A, type II DM, dementia.  Presents with a fall leading to right leg pain found to have distant femur fracture. Currently plan is monitor postop recovery.  Assessment and Plan: 1. Right femur fracture  Presented to [facility] ED in [place] after she was found down at home and had x-rays concerning for mildly displaced right femoral shaft fracture  Orthopedics were consulted. Patient underwent surgery on 3/11. Tolerated procedure very well. Weightbearing for transfer to right lower extremity.  Reinforcing dressing as needed. From orthopedic perspective okay to resume Eliquis. Appreciate assistance. PT OT recommends SNF.  2. ?Upper GI bleeding  Dilutional anemia as well as postoperative acute blood loss anemia. Patient's daughter reported that emesis in ED looked like dark blood  No vomiting seen here as well. Require 1 PRBC on 3/11 before surgery, although suspect this is secondary to dilution. After on PRBC transfusion hemoglobin came up to 8.7.  Currently hemoglobin is 7.9 likely postop anemia.  Monitor.  3. Atrial fibrillation  CHADS-VASc at least 79 (age x2, CHF, HTN, DM, gender)  Hold Eliquis, resume likely on 3/13.  4. Non-ischemic cardiomyopathy  EF had normalized on most recent echo in 2019 at which time she had mild LVH, severe LAE, mild AI, mild MR, and moderate TR She appears euvolemic  Hold diuretics   5. Complete heart block  Pacer interrogated in January 2021, had normal device function    6. Type II DM   Check CBGs and use a low-intensity SSI for now    7. CKD IIIa  SCr was 1.00 in ED which appears to be her baseline Renally-dose medications,  monitor   8. Hypertension  BP at goal, treat as-needed only for now    9. Chronic hypoxic respiratory failure  Appears stable on her usual 2 Lpm   10. Dementia  Per family the patient is disoriented at baseline, walks with a walker, and is able to feed, dress, and toilet on her own   38.  Cough. Atelectasis likely. Monitor. Low threshold to initiate antibiotics.  Diet: Regular diet dysphagia 3 diet. DVT Prophylaxis:   SCDs Start: 05/24/20 1244 SCDs Start: 05/23/20 3846    Advance goals of care discussion: DNR  Family Communication: family was present at bedside, at the time of interview.   Disposition:  Status is: Inpatient  Remains inpatient appropriate because:Inpatient level of care appropriate due to severity of illness  Dispo: The patient is from: Home              Anticipated d/c is to: SNF              Patient currently is not medically stable to d/c.   Difficult to place patient No  Subjective: No acute complaint.  Continues to have cough ongoing since admission.  Has greenish to yellow mucus production without any blood.  No nausea no vomiting.  Physical Exam:  General: Appear in mild distress, no Rash; Oral Mucosa Clear, moist. no Abnormal Neck Mass Or lumps, Conjunctiva normal  Cardiovascular: S1 and S2 Present, no Murmur, Respiratory: good respiratory effort, Bilateral Air entry present and CTA, no Crackles, no wheezes Abdomen: Bowel Sound present, Soft and no  tenderness Extremities: no Pedal edema Neurology: alert and oriented to time, place, and person affect appropriate. no new focal deficit Gait not checked due to patient safety concerns  Vitals:   05/25/20 0329 05/25/20 0743 05/25/20 1209 05/25/20 1942  BP: (!) 113/45 (!) 118/47 (!) 107/51 100/76  Pulse: 69 70 72 71  Resp: 16 18 18 17   Temp: 98 F (36.7 C) 98 F (36.7 C) 97.8 F (36.6 C) 98 F (36.7 C)  TempSrc:    Oral  SpO2: 99% 97% 94% 100%    Intake/Output Summary (Last 24 hours)  at 05/25/2020 2002 Last data filed at 05/25/2020 1900 Gross per 24 hour  Intake 720 ml  Output 1400 ml  Net -680 ml   There were no vitals filed for this visit.  Data Reviewed: I have personally reviewed and interpreted daily labs, tele strips, imaging. I reviewed all nursing notes, pharmacy notes, vitals, pertinent old records I have discussed plan of care as described above with RN and patient/family.  CBC: Recent Labs  Lab 05/23/20 0627 05/23/20 1355 05/24/20 0215 05/24/20 1059 05/25/20 0308  WBC 10.0 8.6 9.6 11.5* 9.5  HGB 8.2* 8.0* 7.1* 8.7* 7.9*  HCT 26.6* 24.9* 22.5* 26.7* 24.4*  MCV 84.4 83.6 84.9 86.1 87.8  PLT 367 378 318 302 286   Basic Metabolic Panel: Recent Labs  Lab 05/23/20 0627 05/24/20 0215 05/25/20 0308  NA 134* 131* 134*  K 4.0 3.9 3.9  CL 96* 95* 97*  CO2 31 26 29   GLUCOSE 119* 120* 114*  BUN 34* 38* 39*  CREATININE 1.26* 1.46* 1.41*  CALCIUM 8.5* 8.2* 8.2*  MG  --  1.8 1.6*    Studies: DG CHEST PORT 1 VIEW  Result Date: 05/25/2020 CLINICAL DATA:  Respiratory failure EXAM: PORTABLE CHEST 1 VIEW COMPARISON:  May 23, 2020 and June 18, 2018 FINDINGS: There is persistent airspace consolidation in the left lower lobe with equivocal left pleural effusion lungs elsewhere are clear. Heart is mildly enlarged with pacemaker leads attached to right atrium right ventricle. There is aortic atherosclerosis. No evident adenopathy. Evidence of old trauma in the proximal right humerus with remodeling, stable. IMPRESSION: Apparent degree of airspace opacity in the left lower lobe, likely due to combination of chronic volume loss and suspected superimposed pneumonia. Equivocal left pleural effusion. Lungs elsewhere clear. Cardiomegaly. Pacemaker leads attached to right atrium and right ventricle. Aortic Atherosclerosis (ICD10-I70.0). Electronically Signed   By: May 25, 2020 III M.D.   On: 05/25/2020 11:26    Scheduled Meds: . atorvastatin  10 mg Oral QPM  .  Chlorhexidine Gluconate Cloth  6 each Topical Q0600  . feeding supplement (GLUCERNA SHAKE)  237 mL Oral TID BM  . isosorbide mononitrate  30 mg Oral Daily  . methimazole  5 mg Oral Daily  . metoprolol succinate  25 mg Oral Daily  . pantoprazole (PROTONIX) IV  40 mg Intravenous Q12H  . polyethylene glycol  17 g Oral Daily  . senna-docusate  1 tablet Oral BID   Continuous Infusions: . 0.9 % NaCl with KCl 20 mEq / L 10 mL/hr at 05/24/20 1311   PRN Meds: acetaminophen **OR** acetaminophen, fentaNYL (SUBLIMAZE) injection, labetalol, metoCLOPramide **OR** metoCLOPramide (REGLAN) injection, ondansetron **OR** ondansetron (ZOFRAN) IV, oxyCODONE, senna-docusate  Time spent: 35 minutes  Author: 07/25/2020, MD [facility] 05/25/2020 8:02 PM  To reach On-call, see care teams to locate the attending and reach out via [email or web address]. Between 7PM-7AM, please contact night-coverage If you still have difficulty reaching  the attending provider, please page the [facility] (Director on Call) for [facility] on amion for assistance.

## 2020-05-25 NOTE — Progress Notes (Signed)
Occupational Therapy Evaluation Patient Details Name: Denise [name] MRN: [id number] DOB: December 21, 1923 Today's Date: 05/25/2020    History of Present Illness Pt is 87 yo female admitted s/p fall with resultant right femoral  shaft fracture.  Underwent IM Nail 05/24/20.   W/u also revealed potential GI Bleed.  PMH significant for hypertension, nonischemic cardiomyopathy, heart block with pacer, hypothyroidism, chronic 2 L/min supplemental oxygen requirement, atrial fibrillation on Eliquis, CKD 3A, type 2 diabetes mellitus, and suspected dementia   Clinical Impression   Pt presents with above diagnosis. PTA pt PLOF living at home with son, mod I with use of AE for ADLs. Son at bed side reports caregiver assistance for 2 hrs per day, light meals prepped, and accessible bathroom. Pt currently limited with safe ADL engagement due to pain, weakness, cognitive deficit, and safety awareness. Pt will benefit from continued skilled and acute OT to ensure increased strength and function prior to returning to home with support. DC recommendation to SNF level OT.    Follow Up Recommendations  SNF;Supervision/Assistance - 24 hour    Equipment Recommendations  3 in 1 bedside commode    Recommendations for Other Services       Precautions / Restrictions Precautions Precautions: Fall Required Braces or Orthoses:  (knee immobilizer in room, but see no order for use) Restrictions Weight Bearing Restrictions: Yes RLE Weight Bearing: Weight bearing as tolerated      Mobility Bed Mobility Overal bed mobility: Needs Assistance Bed Mobility: Sit to Supine     Supine to sit: Max assist;+2 for physical assistance     General bed mobility comments: assist of 2 to come from edge of bed and supine.    Transfers Overall transfer level: Needs assistance Equipment used: Rolling walker (2 wheeled) Transfers: Sit to/from Stand Sit to Stand: +2 physical assistance;Max assist         General transfer  comment: heavy assist to power up and reach upright for stedy to stand; unable to ambulate. OT and RN assisted with pushing hips forward to stand up.    Balance Overall balance assessment: Needs assistance Sitting-balance support: Bilateral upper extremity supported;Feet supported Sitting balance-Leahy Scale: Fair                                     ADL either performed or assessed with clinical judgement   ADL Overall ADL's : Needs assistance/impaired Eating/Feeding: Set up;Sitting   Grooming: Wash/dry hands;Oral care;Set up;Sitting   Upper Body Bathing: Moderate assistance   Lower Body Bathing: Maximal assistance   Upper Body Dressing : Moderate assistance   Lower Body Dressing: Total assistance   Toilet Transfer: Total assistance;+2 for safety/equipment;+2 for physical assistance;Cueing for safety;Cueing for sequencing Toilet Transfer Details (indicate cue type and reason): stedy used for simulated toilet transfer from recliner to bed. heavy +2 pt demonstrate UE weakness to pull up from bars, limited with standing up right due to bending at hip and leaning forward.         Functional mobility during ADLs: Total assistance;+2 for physical assistance;+2 for safety/equipment;Cueing for safety;Cueing for sequencing General ADL Comments: pt limited with following direction due to h/o dementia, required simple 1 step cues with some HOH A.     Vision         Perception     Praxis      Pertinent Vitals/Pain Pain Assessment: Faces Faces Pain Scale: Hurts whole lot Pain  Location: right LE Pain Descriptors / Indicators: Discomfort;Grimacing;Guarding Pain Intervention(s): Limited activity within patient's tolerance;Monitored during session;Repositioned     Hand Dominance Right   Extremity/Trunk Assessment Upper Extremity Assessment Upper Extremity Assessment: Generalized weakness;RUE deficits/detail (prior shd injury per son at bedside. Limited ROM)    Lower Extremity Assessment Lower Extremity Assessment: Defer to PT evaluation RLE: Unable to fully assess due to pain       Communication Communication Communication: HOH   Cognition Arousal/Alertness: Awake/alert Behavior During Therapy: Anxious Overall Cognitive Status: History of cognitive impairments - at baseline                                 General Comments: at baseline per son   General Comments  Son and friend at bedside, assisted with PLOF and history    Exercises     Shoulder Instructions      Home Living Family/patient expects to be discharged to:: Private residence Living Arrangements: Children Available Help at Discharge: Family;Available PRN/intermittently Type of Home: House Home Access: Stairs to enter;Ramped entrance Entrance Stairs-Number of Steps: 1 Entrance Stairs-Rails: None Home Layout: Able to live on main level with bedroom/bathroom     Bathroom Shower/Tub: Chief Strategy Officer: Handicapped height Bathroom Accessibility: Yes   Home Equipment: Environmental consultant - 2 wheels;Toilet riser   Additional Comments: Pt lives with son; caregivers come in every day for 2 hours for bathing, dressing. Pt is home alone once caregiver leaves and son come back afternoon from work.      Prior Functioning/Environment Level of Independence: Needs assistance  Gait / Transfers Assistance Needed: per son, able to ambulate with RW independently in home ADL's / Homemaking Assistance Needed: son lays out breakfast and cooks dinner for pt            OT Problem List: Decreased strength;Decreased activity tolerance;Impaired balance (sitting and/or standing);Decreased cognition;Decreased safety awareness;Decreased knowledge of use of DME or AE;Decreased knowledge of precautions;Pain;Impaired UE functional use      OT Treatment/Interventions: Self-care/ADL training;Therapeutic exercise;DME and/or AE instruction;Therapeutic activities;Cognitive  remediation/compensation;Patient/family education;Balance training    OT Goals(Current goals can be found in the care plan section) Acute Rehab OT Goals Patient Stated Goal: get her strong enough to come home OT Goal Formulation: With family Time For Goal Achievement: 06/08/20 Potential to Achieve Goals: Fair  OT Frequency: Min 3X/week   Barriers to D/C: Decreased caregiver support  pt is left alone for a few hours during the day after caregiver leaves and while son is at work.       Co-evaluation              AM-PAC OT "6 Clicks" Daily Activity     Outcome Measure Help from another person eating meals?: A Little Help from another person taking care of personal grooming?: A Little Help from another person toileting, which includes using toliet, bedpan, or urinal?: A Lot Help from another person bathing (including washing, rinsing, drying)?: A Lot Help from another person to put on and taking off regular upper body clothing?: A Lot Help from another person to put on and taking off regular lower body clothing?: A Lot 6 Click Score: 14   End of Session Equipment Utilized During Treatment: Gait belt;Other (comment) (stedy) Nurse Communication: Mobility status;Need for lift equipment  Activity Tolerance: Patient limited by pain Patient left: in bed;with call bell/phone within reach;with bed alarm set;with family/visitor present  OT Visit  Diagnosis: Unsteadiness on feet (R26.81);Repeated falls (R29.6);Muscle weakness (generalized) (M62.81);History of falling (Z91.81);Pain                Time: 1243-1318 OT Time Calculation (min): 35 min Charges:  OT General Charges $OT Visit: 1 Visit OT Evaluation $OT Eval Moderate Complexity: 1 Mod OT Treatments $Self Care/Home Management : 8-22 mins  Marquette Old, MSOT, OTR/L  Supplemental Rehabilitation Services  [phone]   Zigmund Daniel 05/25/2020, 2:36 PM

## 2020-05-25 NOTE — NC FL2 (Signed)
[place] MEDICAID FL2 LEVEL OF CARE SCREENING TOOL     IDENTIFICATION  Patient Name: Denise [name] Birthdate: February 04, 1924 Sex: female Admission Date (Current Location): 05/23/2020  [facility] and [place] Number:   Ericka Pontiff)   Facility and Address:  [facility], [address] [place], [place] [address]      Provider Number: [id number]  Attending Physician Name and Address:  Rolly Salter, MD  Relative Name and Phone Number:       Current Level of Care: Hospital Recommended Level of Care: Skilled Nursing Facility Prior Approval Number:    Date Approved/Denied:   PASRR Number: [id number]  Discharge Plan: SNF    Current Diagnoses: Patient Active Problem List   Diagnosis Date Noted  . Closed fracture of right femur (HCC) 05/23/2020  . Chronic respiratory failure with hypoxia (HCC) 05/23/2020  . Anemia due to chronic kidney disease 05/23/2020  . Femur fracture, right (HCC) 05/23/2020  . Chronic kidney disease, stage 3a (HCC) 06/21/2018  . Volume overload 06/18/2018  . Thyroid disease 06/17/2018  . Acute on chronic combined systolic (congestive) and diastolic (congestive) heart failure (HCC) 06/17/2018  . Elevated troponin 06/17/2018  . Abdominal distention 06/17/2018  . Pacemaker battery depletion 08/15/2017  . Chronic anticoagulation - coumadin, CHADS2VASC=5 06/07/2015  . Chronic combined systolic and diastolic CHF, NYHA class 2 (HCC) 06/07/2015  . Heme positive stool 06/07/2015  . GIB (gastrointestinal bleeding) 06/07/2015  . Hyperlipidemia 04/11/2015  . Acute on chronic combined systolic and diastolic CHF (congestive heart failure) (HCC) 01/08/2015  . Nonischemic cardiomyopathy (HCC) 10/20/2012  . CHB (complete heart block) (HCC) 10/20/2012  . Pacemaker 09/06/2012  . Fracture of right hip (HCC) 06/05/2012  . Fracture of right humerus 06/05/2012  . Atrial fibrillation (HCC) 06/05/2012  . DM (diabetes mellitus) (HCC) 06/05/2012  .  HTN (hypertension) 06/05/2012  . Hypertension 06/05/2012  . Warfarin-induced coagulopathy (HCC) 06/05/2012    Orientation RESPIRATION BLADDER Height & Weight     Self  O2 ([place] 2L) Indwelling catheter Weight:   Height:     BEHAVIORAL SYMPTOMS/MOOD NEUROLOGICAL BOWEL NUTRITION STATUS      Continent Diet (see DC summary)  AMBULATORY STATUS COMMUNICATION OF NEEDS Skin   Extensive Assist Verbally Surgical wounds (closed right leg, compression wrap dressing; closed sacrum, foam dressing)                       Personal Care Assistance Level of Assistance  Bathing,Feeding,Dressing Bathing Assistance: Maximum assistance Feeding assistance: Limited assistance Dressing Assistance: Maximum assistance     Functional Limitations Info  Sight,Hearing Sight Info: Impaired Hearing Info: Impaired      SPECIAL CARE FACTORS FREQUENCY  PT (By licensed PT),OT (By licensed OT)     PT Frequency: 5x/wk OT Frequency: 5x/wk            Contractures Contractures Info: Not present    Additional Factors Info  Code Status,Allergies Code Status Info: DNR Allergies Info: Iohexol, Shrimp (Shellfish Allergy)           Current Medications (05/25/2020):  This is the current hospital active medication list Current Facility-Administered Medications  Medication Dose Route Frequency Provider Last Rate Last Admin  . 0.9 % NaCl with KCl 20 mEq/ L  infusion   Intravenous Continuous Despina Hidden, PA-C 10 mL/hr at 05/24/20 1311 New Bag at 05/24/20 1311  . acetaminophen (TYLENOL) suppository 650 mg  650 mg Rectal Q4H PRN Despina Hidden, PA-C  Or  . acetaminophen (TYLENOL) tablet 650 mg  650 mg Oral Q6H PRN Despina Hidden, PA-C   650 mg at 05/24/20 1630  . atorvastatin (LIPITOR) tablet 10 mg  10 mg Oral QPM Ulyses Southward A, PA-C   10 mg at 05/24/20 1710  . Chlorhexidine Gluconate Cloth 2 % PADS 6 each  6 each Topical Q0600 Despina Hidden, PA-C   6 each at 05/25/20 0830  . feeding supplement  (GLUCERNA SHAKE) (GLUCERNA SHAKE) liquid 237 mL  237 mL Oral TID BM Ulyses Southward A, PA-C   237 mL at 05/25/20 1400  . fentaNYL (SUBLIMAZE) injection 12.5-25 mcg  12.5-25 mcg Intravenous Q2H PRN Despina Hidden, PA-C      . insulin aspart (novoLOG) injection 0-6 Units  0-6 Units Subcutaneous Q4H Ulyses Southward A, PA-C      . isosorbide mononitrate (IMDUR) 24 hr tablet 30 mg  30 mg Oral Daily Ulyses Southward A, PA-C   30 mg at 05/25/20 0840  . labetalol (NORMODYNE) injection 10 mg  10 mg Intravenous Q4H PRN Despina Hidden, PA-C      . methimazole (TAPAZOLE) tablet 5 mg  5 mg Oral Daily Ulyses Southward A, PA-C   5 mg at 05/25/20 0841  . metoCLOPramide (REGLAN) tablet 5-10 mg  5-10 mg Oral Q8H PRN Ulyses Southward A, PA-C       Or  . metoCLOPramide (REGLAN) injection 5-10 mg  5-10 mg Intravenous Q8H PRN Despina Hidden, PA-C      . metoprolol succinate (TOPROL-XL) 24 hr tablet 25 mg  25 mg Oral Daily Ulyses Southward A, PA-C   25 mg at 05/25/20 0840  . ondansetron (ZOFRAN) tablet 4 mg  4 mg Oral Q6H PRN Ulyses Southward A, PA-C       Or  . ondansetron [facility]) injection 4 mg  4 mg Intravenous Q6H PRN Ulyses Southward A, PA-C      . oxyCODONE (Oxy IR/ROXICODONE) immediate release tablet 5 mg  5 mg Oral Q4H PRN Despina Hidden, PA-C      . pantoprazole (PROTONIX) injection 40 mg  40 mg Intravenous Q12H Ulyses Southward A, PA-C   40 mg at 05/25/20 0841  . polyethylene glycol (MIRALAX / GLYCOLAX) packet 17 g  17 g Oral Daily Rolly Salter, MD   17 g at 05/25/20 1100  . senna-docusate (Senokot-S) tablet 1 tablet  1 tablet Oral QHS PRN Despina Hidden, PA-C      . senna-docusate (Senokot-S) tablet 1 tablet  1 tablet Oral BID Rolly Salter, MD   1 tablet at 05/25/20 1130     Discharge Medications: Please see discharge summary for a list of discharge medications.  Relevant Imaging Results:  Relevant Lab Results:   Additional Information SS#: [id number]  Baldemar Lenis, LCSW

## 2020-05-25 NOTE — TOC Initial Note (Addendum)
Transition of Care [facility]) - Initial/Assessment Note    Patient Details  Name: Denise [name] MRN: [id number] Date of Birth: 27-Sep-1923  Transition of [facility]) CM/SW Contact:    Baldemar Lenis, LCSW Phone Number: 05/25/2020, 4:03 PM  Clinical Narrative:      CSW spoke with patient's son, Fayrene Fearing, via phone to discuss recommendation for SNF. Patient has been at rehab before, at [facility] in [place], and did well there. CSW asked if that was an inpatient rehab, but son indicated that there is a nursing home within the hospital. Son would like to see if she could get in there as his first choice, and his second choice would be [facility] in [place], [place]. Other than that, he is not aware of any others, but he would like to keep her close to home in [place]. Fayrene Fearing was asking about insurance coverage, as he had previously been charged because she stayed past her 100 days but he was not told that he would be billed. CSW advised him that Medicare covers up to 100 days if necessary, and those 100 days reset after 60 days of wellness. Fayrene Fearing appreciative of information. CSW has completed referral and will reach out to facilities to send referrals.             UPDATE: CSW contacted [facility], spoke with receptionist. There is no one working admissions over the weekend, but CSW left a message for a call back on Monday. Upon looking into [facility], that is an inpatient rehab, which isn't currently recommended for patient. CSW to follow.  Expected Discharge Plan: Skilled Nursing Facility Barriers to Discharge: Continued Medical Work up   Patient Goals and CMS Choice Patient states their goals for this hospitalization and ongoing recovery are:: patient unable to participate in goal setting, only oriented to self CMS Medicare.gov Compare Post Acute Care list provided to:: Patient Represenative (must comment) Choice offered to / list presented to : Adult Children  Expected Discharge  Plan and Services Expected Discharge Plan: Skilled Nursing Facility     Post Acute Care Choice: Skilled Nursing Facility Living arrangements for the past 2 months: Single Family Home                                      Prior Living Arrangements/Services Living arrangements for the past 2 months: Single Family Home Lives with:: Adult Children Patient language and need for interpreter reviewed:: No Do you feel safe going back to the place where you live?: Yes      Need for Family Participation in Patient Care: Yes (Comment) Care giver support system in place?: No (comment) Current home services: DME,Sitter Criminal Activity/Legal Involvement Pertinent to Current Situation/Hospitalization: No - Comment as needed  Activities of Daily Living Home Assistive Devices/Equipment: Dan Humphreys (specify type) ADL Screening (condition at time of admission) Patient's cognitive ability adequate to safely complete daily activities?: Yes Is the patient deaf or have difficulty hearing?: Yes Does the patient have difficulty seeing, even when wearing glasses/contacts?: Yes Does the patient have difficulty concentrating, remembering, or making decisions?: Yes Patient able to express need for assistance with ADLs?: Yes Does the patient have difficulty dressing or bathing?: Yes Independently performs ADLs?: No Does the patient have difficulty walking or climbing stairs?: Yes Weakness of Legs: Right Weakness of Arms/Hands: None  Permission Sought/Granted Permission sought to share information with : Multimedia programmer  Permission granted to share information with : Yes, Verbal Permission Granted  Share Information with NAME: Fayrene Fearing  Permission granted to share info w AGENCY: SNF  Permission granted to share info w Relationship: Son     Emotional Assessment   Attitude/Demeanor/Rapport: Unable to Assess Affect (typically observed): Unable to Assess Orientation: :  Oriented to Self Alcohol / Substance Use: Not Applicable Psych Involvement: No (comment)  Admission diagnosis:  Femur fracture, right (HCC) [S72.91XA] Patient Active Problem List   Diagnosis Date Noted  . Closed fracture of right femur (HCC) 05/23/2020  . Chronic respiratory failure with hypoxia (HCC) 05/23/2020  . Anemia due to chronic kidney disease 05/23/2020  . Femur fracture, right (HCC) 05/23/2020  . Chronic kidney disease, stage 3a (HCC) 06/21/2018  . Volume overload 06/18/2018  . Thyroid disease 06/17/2018  . Acute on chronic combined systolic (congestive) and diastolic (congestive) heart failure (HCC) 06/17/2018  . Elevated troponin 06/17/2018  . Abdominal distention 06/17/2018  . Pacemaker battery depletion 08/15/2017  . Chronic anticoagulation - coumadin, CHADS2VASC=5 06/07/2015  . Chronic combined systolic and diastolic CHF, NYHA class 2 (HCC) 06/07/2015  . Heme positive stool 06/07/2015  . GIB (gastrointestinal bleeding) 06/07/2015  . Hyperlipidemia 04/11/2015  . Acute on chronic combined systolic and diastolic CHF (congestive heart failure) (HCC) 01/08/2015  . Nonischemic cardiomyopathy (HCC) 10/20/2012  . CHB (complete heart block) (HCC) 10/20/2012  . Pacemaker 09/06/2012  . Fracture of right hip (HCC) 06/05/2012  . Fracture of right humerus 06/05/2012  . Atrial fibrillation (HCC) 06/05/2012  . DM (diabetes mellitus) (HCC) 06/05/2012  . HTN (hypertension) 06/05/2012  . Hypertension 06/05/2012  . Warfarin-induced coagulopathy (HCC) 06/05/2012   PCP:  Daryl Eastern, MD Pharmacy:   [facility] [place], [place] - [address] [place] [address] Phone: [phone] Fax: [phone]     Social Determinants of Health (SDOH) Interventions    Readmission Risk Interventions No flowsheet data found.

## 2020-05-25 NOTE — Plan of Care (Signed)
Patient is s/p right IM nailing by Dr. Jena Gauss. ACE wrap to right lower extremity clean dry and intact. Patient is alert to self but able to be reoriented. No s/sx of bleeding noted. No complaints of pain at this time. Discharge plan to SNF once bed is available. Will continue to monitor and continue current POC.

## 2020-05-25 NOTE — Evaluation (Signed)
Physical Therapy Evaluation Patient Details Name: Denise [name] MRN: [id number] DOB: August 27, 1923 Today's Date: 05/25/2020   History of Present Illness  Pt is 87 yo female admitted s/p fall with resultant right femoral  shaft fracture.  Underwent IM Nail 05/24/20.   W/u also revealed potential GI Bleed.  PMH significant for hypertension, nonischemic cardiomyopathy, heart block with pacer, hypothyroidism, chronic 2 L/min supplemental oxygen requirement, atrial fibrillation on Eliquis, CKD 3A, type 2 diabetes mellitus, and suspected dementia  Clinical Impression  Patient presents with dependencies in gait and mobility due to pain, generalized weakness, cognition and anxiety.  Feel patient will benefit from PT to progress mobility and gait.  Patient needs to be fairly independent to return home as she is alone during a portion of the day.  Feel she may benefit from short-term SNF to progress to home.      Follow Up Recommendations SNF    Equipment Recommendations  None recommended by PT    Recommendations for Other Services       Precautions / Restrictions Precautions Precautions: Fall Required Braces or Orthoses:  (knee immobilizer in room, but see no order for use) Restrictions Weight Bearing Restrictions: Yes RLE Weight Bearing: Weight bearing as tolerated      Mobility  Bed Mobility Overal bed mobility: Needs Assistance Bed Mobility: Supine to Sit     Supine to sit: Max assist;+2 for physical assistance     General bed mobility comments: assist of 2 to come to edge of bed and come to sitting    Transfers Overall transfer level: Needs assistance Equipment used: Rolling walker (2 wheeled) Transfers: Sit to/from Stand Sit to Stand: Mod assist;+2 physical assistance         General transfer comment: assist to power up and reach upright; unable to ambulated  Ambulation/Gait                Stairs            Wheelchair Mobility    Modified Rankin  (Stroke Patients Only)       Balance Overall balance assessment: Needs assistance Sitting-balance support: Bilateral upper extremity supported;Feet supported Sitting balance-Leahy Scale: Fair                                       Pertinent Vitals/Pain Pain Assessment: Faces Faces Pain Scale: Hurts even more Pain Location: right LE Pain Descriptors / Indicators: Discomfort;Grimacing;Guarding Pain Intervention(s): Limited activity within patient's tolerance;Monitored during session;Repositioned;Utilized relaxation techniques (pt did not want pain meds)    Home Living Family/patient expects to be discharged to:: Private residence Living Arrangements: Children Available Help at Discharge: Family;Available PRN/intermittently Type of Home: House Home Access: Stairs to enter;Ramped entrance Entrance Stairs-Rails: None Entrance Stairs-Number of Steps: 1 Home Layout: Able to live on main level with bedroom/bathroom Home Equipment: Walker - 2 wheels Additional Comments: Pt lives with son; caregivers come in every day for 2 hours for bathing, dressing    Prior Function Level of Independence: Needs assistance   Gait / Transfers Assistance Needed: per son, able to ambulate with RW independently in home  ADL's / Homemaking Assistance Needed: son lays out breakfast and cooks dinner for pt        Hand Dominance        Extremity/Trunk Assessment        Lower Extremity Assessment Lower Extremity Assessment: Generalized weakness;RLE deficits/detail RLE: Unable to  fully assess due to pain       Communication   Communication: No difficulties  Cognition Arousal/Alertness: Awake/alert Behavior During Therapy: Anxious Overall Cognitive Status: History of cognitive impairments - at baseline                                 General Comments: at baseline per son      General Comments      Exercises     Assessment/Plan    PT Assessment Patient  needs continued PT services  PT Problem List Decreased strength;Decreased range of motion;Decreased activity tolerance;Decreased balance;Decreased mobility;Decreased cognition;Pain       PT Treatment Interventions DME instruction;Gait training;Functional mobility training;Balance training;Therapeutic exercise;Therapeutic activities;Patient/family education    PT Goals (Current goals can be found in the Care Plan section)  Acute Rehab PT Goals Patient Stated Goal: get her strong enough to come home PT Goal Formulation: With patient/family Time For Goal Achievement: 06/08/20 Potential to Achieve Goals: Good    Frequency Min 3X/week   Barriers to discharge Decreased caregiver support typically alone during day until son home after work    Co-evaluation               AM-PAC PT "6 Clicks" Mobility  Outcome Measure Help needed turning from your back to your side while in a flat bed without using bedrails?: A Lot Help needed moving from lying on your back to sitting on the side of a flat bed without using bedrails?: A Lot Help needed moving to and from a bed to a chair (including a wheelchair)?: A Lot Help needed standing up from a chair using your arms (e.g., wheelchair or bedside chair)?: A Lot Help needed to walk in hospital room?: Total Help needed climbing 3-5 steps with a railing? : Total 6 Click Score: 10    End of Session Equipment Utilized During Treatment: Gait belt Activity Tolerance: Patient tolerated treatment well;Patient limited by pain Patient left: in chair;with call bell/phone within reach;with chair alarm set;with family/visitor present Nurse Communication: Mobility status PT Visit Diagnosis: Repeated falls (R29.6);History of falling (Z91.81);Other abnormalities of gait and mobility (R26.89);Muscle weakness (generalized) (M62.81);Pain Pain - Right/Left: Right Pain - part of body: Leg    Time: 0940-1016 PT Time Calculation (min) (ACUTE ONLY): 36  min   Charges:   PT Evaluation $PT Eval Moderate Complexity: 1 Mod PT Treatments $Therapeutic Activity: 8-22 mins        05/25/2020 Margie, PT Acute Rehabilitation Services Pager:  [phone] Office:  [phone]    Olivia Canter 05/25/2020, 10:48 AM

## 2020-05-26 ENCOUNTER — Inpatient Hospital Stay (HOSPITAL_COMMUNITY): Payer: Medicare Other

## 2020-05-26 DIAGNOSIS — S72341A Displaced spiral fracture of shaft of right femur, initial encounter for closed fracture: Secondary | ICD-10-CM | POA: Diagnosis not present

## 2020-05-26 LAB — CBC
HCT: 23.9 % — ABNORMAL LOW (ref 36.0–46.0)
Hemoglobin: 8 g/dL — ABNORMAL LOW (ref 12.0–15.0)
MCH: 29.2 pg (ref 26.0–34.0)
MCHC: 33.5 g/dL (ref 30.0–36.0)
MCV: 87.2 fL (ref 80.0–100.0)
Platelets: 308 10*3/uL (ref 150–400)
RBC: 2.74 MIL/uL — ABNORMAL LOW (ref 3.87–5.11)
RDW: 17.7 % — ABNORMAL HIGH (ref 11.5–15.5)
WBC: 11.6 10*3/uL — ABNORMAL HIGH (ref 4.0–10.5)
nRBC: 0 % (ref 0.0–0.2)

## 2020-05-26 LAB — BASIC METABOLIC PANEL
Anion gap: 7 (ref 5–15)
BUN: 38 mg/dL — ABNORMAL HIGH (ref 8–23)
CO2: 27 mmol/L (ref 22–32)
Calcium: 7.9 mg/dL — ABNORMAL LOW (ref 8.9–10.3)
Chloride: 95 mmol/L — ABNORMAL LOW (ref 98–111)
Creatinine, Ser: 1.42 mg/dL — ABNORMAL HIGH (ref 0.44–1.00)
GFR, Estimated: 34 mL/min — ABNORMAL LOW (ref >60)
Glucose, Bld: 113 mg/dL — ABNORMAL HIGH (ref 70–99)
Potassium: 3.4 mmol/L — ABNORMAL LOW (ref 3.5–5.1)
Sodium: 129 mmol/L — ABNORMAL LOW (ref 135–145)

## 2020-05-26 LAB — MAGNESIUM: Magnesium: 2 mg/dL (ref 1.7–2.4)

## 2020-05-26 MED ORDER — POTASSIUM CHLORIDE 10 MEQ/100ML IV SOLN
10.0000 meq | INTRAVENOUS | Status: AC
  Administered 2020-05-26 (×3): 10 meq via INTRAVENOUS
  Filled 2020-05-26 (×3): qty 100

## 2020-05-26 MED ORDER — PANTOPRAZOLE SODIUM 40 MG IV SOLR
40.0000 mg | Freq: Every day | INTRAVENOUS | Status: DC
  Administered 2020-05-27: 40 mg via INTRAVENOUS
  Filled 2020-05-26: qty 40

## 2020-05-26 MED ORDER — SODIUM CHLORIDE 0.9 % IV SOLN: INTRAVENOUS | Status: DC

## 2020-05-26 MED ORDER — MORPHINE SULFATE (PF) 2 MG/ML IV SOLN: 2.0000 mg | INTRAVENOUS | Status: DC | PRN

## 2020-05-26 MED ORDER — PANTOPRAZOLE SODIUM 40 MG PO TBEC: 40.0000 mg | DELAYED_RELEASE_TABLET | Freq: Every day | ORAL | Status: DC

## 2020-05-26 MED ORDER — POTASSIUM CHLORIDE CRYS ER 20 MEQ PO TBCR
40.0000 meq | EXTENDED_RELEASE_TABLET | Freq: Once | ORAL | Status: AC
  Administered 2020-05-26: 40 meq via ORAL
  Filled 2020-05-26: qty 2

## 2020-05-26 NOTE — Progress Notes (Signed)
ORTHOPAEDIC PROGRESS NOTE  s/p Procedure(s): INTRAMEDULLARY (IM) RETROGRADE FEMORAL NAILING on 05/24/2020 with Dr. Jena Gauss  SUBJECTIVE: Resting comfortably in hospital bed. No complaints.   OBJECTIVE: PE: General: resting comfortably in hospital bed, NAD RLE: dressing CDI. Able to wiggle her toes. Endorses distal sensation. Warm well perfused foot.   Vitals:   05/26/20 0300 05/26/20 0745  BP: 107/68 (!) 118/56  Pulse: 70 70  Resp: 16 17  Temp: 98 F (36.7 C) 97.9 F (36.6 C)  SpO2: 98% 99%     ASSESSMENT: Denise [name] is a 87 y.o. female POD#2  PLAN: Weightbearing: weight-bear for transfers to the right lower extremity Insicional and dressing care: Reinforce dressings as needed Orthopedic device(s): None VTE prophylaxis: Resume eliquis when Hgb stable ABLA: Hgb 8.0 today. Continue to monitor.  Pain control: PRN pain medications, preferring oral medications Follow - up plan: In office with Dr. Samson Frederic information: After hours and holidays please check [email or web address] for group call information for [facility] Dispo: TBD. PT/OT recommending SNF. TOC following.    Denise Alpers, PA-C 05/26/2020

## 2020-05-26 NOTE — Progress Notes (Signed)
[facility] Progress Note  Patient: Denise [name]    DPO:[id number]  DOA: 05/23/2020     Date of Service: the patient was seen and examined on 05/26/2020  Brief hospital course: Past medical history of HTN, PPM implant, hypothyroidism, COPD on chronic respiratory failure with 2 LPM, A. fib on Eliquis, CKD 3A, type II DM, dementia.  Presents with a fall leading to right leg pain found to have distant femur fracture. Currently plan is monitor postop recovery.  Resolution of ileus.  Assessment and Plan: 1. Right femur fracture  Presented to [facility] ED in [place] after she was found down at home and had x-rays concerning for mildly displaced right femoral shaft fracture  Orthopedics were consulted. Patient underwent surgery on 3/11. Tolerated procedure very well. Weightbearing for transfer to right lower extremity.  Reinforcing dressing as needed. From orthopedic perspective okay to resume Eliquis. Appreciate assistance. PT OT recommends SNF.  2. ?Upper GI bleeding  Dilutional anemia as well as postoperative acute blood loss anemia. Patient's daughter reported that emesis in ED looked like dark blood  No vomiting seen here as well. Require 1 PRBC on 3/11 before surgery, although suspect this is secondary to dilution. After on PRBC transfusion hemoglobin came up to 8.7.  Currently hemoglobin is stable around 8, likely postop anemia.  Monitor.  3.   Chronic atrial fibrillation  CHADS-VASc at least 87 (age x2, CHF, HTN, DM, gender)  Hold Eliquis, resume likely on 3/14.  4. Non-ischemic cardiomyopathy  EF had normalized on most recent echo in 2019 at which time she had mild LVH, severe LAE, mild AI, mild MR, and moderate TR She appears euvolemic  Hold diuretics   5. Complete heart block  Pacer interrogated in January 2021, had normal device function    6. Type II DM   Check CBGs and use a low-intensity SSI for now    7. CKD IIIa  SCr was 1.00 in ED which appears  to be her baseline Renally-dose medications, monitor   8. Hypertension  BP at goal, treat as-needed only for now    9. Chronic hypoxic respiratory failure  Appears stable on her usual 2 Lpm   10. Dementia  Per family the patient is disoriented at baseline, walks with a walker, and is able to feed, dress, and toilet on her own   61.  Cough. Atelectasis likely. Monitor. Low threshold to initiate antibiotics. Repeat chest x-ray on 3/13 unchanged.  12.  Distended stomach with intractable nausea and vomiting. Severe constipation. Patient reports recurrent nausea followed by vomiting. Due to severe distention of the stomach NG tube was inserted. Appears to be doing well after insertion with low intermittent suction. Continue replace potassium. Treat constipation with suppository.  Diet: N.p.o. DVT Prophylaxis:   SCDs Start: 05/24/20 1244 SCDs Start: 05/23/20 4315    Advance goals of care discussion: DNR  Family Communication: family was present at bedside, at the time of interview.   Disposition:  Status is: Inpatient  Remains inpatient appropriate because:Inpatient level of care appropriate due to severity of illness  Dispo: The patient is from: Home              Anticipated d/c is to: SNF              Patient currently is not medically stable to d/c.   Difficult to place patient No  Subjective: Recurrent nausea 1 episode of vomiting without any blood.  Continues to have shortness of breath as well as  cough.  But no fever no chills.  No chest pain.  Reports abdominal pain.  Physical Exam:  General: Appear in mild distress, no Rash; Oral Mucosa Clear, moist. no Abnormal Neck Mass Or lumps, Conjunctiva normal  Cardiovascular: S1 and S2 Present, no Murmur, Respiratory: increased respiratory effort, Bilateral Air entry present and bilateral  Crackles, no wheezes Abdomen: Bowel Sound absent, distended, Soft and mild diffuse tenderness Extremities: trace Pedal  edema Neurology: alert and oriented to time, place, and person affect appropriate. no new focal deficit Gait not checked due to patient safety concerns    Vitals:   05/26/20 0745 05/26/20 1619 05/26/20 1844 05/26/20 1936  BP: (!) 118/56 (!) 108/58 (!) 108/58 (!) 114/53  Pulse: 70 69 69 69  Resp: 17 17 17 16   Temp: 97.9 F (36.6 C) 98.5 F (36.9 C) 98.5 F (36.9 C) 99 F (37.2 C)  TempSrc:   Oral Oral  SpO2: 99% 98%  99%  Weight:   72.9 kg   Height:   5' (1.524 m)     Intake/Output Summary (Last 24 hours) at 05/26/2020 2040 Last data filed at 05/26/2020 1711 Gross per 24 hour  Intake 735.68 ml  Output 700 ml  Net 35.68 ml   Filed Weights   05/26/20 0500 05/26/20 1844  Weight: 72.9 kg 72.9 kg    Data Reviewed: I have personally reviewed and interpreted daily labs, tele strips, imaging. I reviewed all nursing notes, pharmacy notes, vitals, pertinent old records I have discussed plan of care as described above with RN and patient/family.  CBC: Recent Labs  Lab 05/23/20 1355 05/24/20 0215 05/24/20 1059 05/25/20 0308 05/26/20 0136  WBC 8.6 9.6 11.5* 9.5 11.6*  HGB 8.0* 7.1* 8.7* 7.9* 8.0*  HCT 24.9* 22.5* 26.7* 24.4* 23.9*  MCV 83.6 84.9 86.1 87.8 87.2  PLT 378 318 302 286 308   Basic Metabolic Panel: Recent Labs  Lab 05/23/20 0627 05/24/20 0215 05/25/20 0308 05/26/20 0136  NA 134* 131* 134* 129*  K 4.0 3.9 3.9 3.4*  CL 96* 95* 97* 95*  CO2 31 26 29 27   GLUCOSE 119* 120* 114* 113*  BUN 34* 38* 39* 38*  CREATININE 1.26* 1.46* 1.41* 1.42*  CALCIUM 8.5* 8.2* 8.2* 7.9*  MG  --  1.8 1.6* 2.0    Studies: DG Abd 1 View  Result Date: 05/26/2020 CLINICAL DATA:  Nasogastric tube placement EXAM: ABDOMEN - 1 VIEW COMPARISON:  May 26, 2020 study obtained earlier in the day FINDINGS: Nasogastric tube tip and side port in stomach. There remains moderate air throughout the stomach. No small or large bowel dilatation evident. No evident free air. Pacemaker leads  attached to right atrium and right ventricle. IMPRESSION: Nasogastric tube tip and side port in stomach. Stomach remains mildly distended with air. No other bowel dilatation. No free air demonstrable. Electronically Signed   By: 05/28/2020 III M.D.   On: 05/26/2020 14:37   DG CHEST PORT 1 VIEW  Result Date: 05/26/2020 CLINICAL DATA:  Shortness of breath EXAM: PORTABLE CHEST 1 VIEW COMPARISON:  May 25, 2020 FINDINGS: Less airspace opacity in the left lower lobe compared to 1 day prior. Small left pleural effusion. Lungs elsewhere are clear. There is cardiomegaly with pulmonary venous hypertension. Pacemaker leads attached to right atrium and right ventricle. There is aortic atherosclerosis. No adenopathy. Degenerative change noted in each shoulder. IMPRESSION: Less airspace opacity left lower lobe compared to 1 day prior. Small left pleural effusion. Lungs elsewhere clear. Stable cardiomegaly with  a degree of pulmonary vascular congestion. Pacemaker leads attached to right atrium and right ventricle. Aortic Atherosclerosis (ICD10-I70.0). Electronically Signed   By: Bretta Bang III M.D.   On: 05/26/2020 13:29   DG Abd Portable 1V  Result Date: 05/26/2020 CLINICAL DATA:  Nausea and vomiting EXAM: PORTABLE ABDOMEN - 1 VIEW COMPARISON:  None. FINDINGS: Stomach is moderately distended with air. There is no appreciable small or large bowel dilatation. No air-fluid levels. No free air. There are surgical clips in the right abdomen. Lung bases are clear. Postoperative change proximal right femur. Multiple foci of vascular calcification in pelvic arterial vessels. IMPRESSION: Stomach moderately distended with air. No small or large bowel dilatation to indicate bowel obstruction. No free air evident on supine examination. Postoperative changes noted in right abdomen. Electronically Signed   By: Bretta Bang III M.D.   On: 05/26/2020 13:27    Scheduled Meds: . Chlorhexidine Gluconate Cloth  6 each  Topical Q0600  . [START ON 05/27/2020] pantoprazole (PROTONIX) IV  40 mg Intravenous Q1200   Continuous Infusions: . sodium chloride 75 mL/hr at 05/26/20 1024  . potassium chloride 10 mEq (05/26/20 2002)   PRN Meds: acetaminophen **OR** acetaminophen, labetalol, morphine injection, ondansetron **OR** ondansetron (ZOFRAN) IV  Time spent: 35 minutes  Author: Lynden Oxford, MD [facility] 05/26/2020 8:40 PM  To reach On-call, see care teams to locate the attending and reach out via [email or web address]. Between 7PM-7AM, please contact night-coverage If you still have difficulty reaching the attending provider, please page the [facility] (Director on Call) for [facility] on amion for assistance.

## 2020-05-26 NOTE — Plan of Care (Signed)
No acute changes since the previous night that I took care of her. Discharge plan for patient is SNF pending bed approvals. Patient is in NAD and no needs voiced. Will continue to monitor and continue current POC.

## 2020-05-27 ENCOUNTER — Encounter (HOSPITAL_COMMUNITY): Payer: Self-pay | Admitting: Student

## 2020-05-27 ENCOUNTER — Inpatient Hospital Stay (HOSPITAL_COMMUNITY): Payer: Medicare Other

## 2020-05-27 DIAGNOSIS — I361 Nonrheumatic tricuspid (valve) insufficiency: Secondary | ICD-10-CM | POA: Diagnosis not present

## 2020-05-27 DIAGNOSIS — I340 Nonrheumatic mitral (valve) insufficiency: Secondary | ICD-10-CM

## 2020-05-27 DIAGNOSIS — I350 Nonrheumatic aortic (valve) stenosis: Secondary | ICD-10-CM

## 2020-05-27 DIAGNOSIS — S72341A Displaced spiral fracture of shaft of right femur, initial encounter for closed fracture: Secondary | ICD-10-CM | POA: Diagnosis not present

## 2020-05-27 DIAGNOSIS — R011 Cardiac murmur, unspecified: Secondary | ICD-10-CM | POA: Diagnosis not present

## 2020-05-27 LAB — MAGNESIUM: Magnesium: 2 mg/dL (ref 1.7–2.4)

## 2020-05-27 LAB — ECHOCARDIOGRAM COMPLETE
AR max vel: 0.72 cm2
AV Area VTI: 0.66 cm2
AV Area mean vel: 0.69 cm2
AV Mean grad: 13.3 mmHg
AV Peak grad: 24.7 mmHg
Ao pk vel: 2.48 m/s
Height: 60 in
P 1/2 time: 367 msec
S' Lateral: 3.4 cm
Weight: 2571.45 oz

## 2020-05-27 LAB — BASIC METABOLIC PANEL
Anion gap: 9 (ref 5–15)
BUN: 42 mg/dL — ABNORMAL HIGH (ref 8–23)
CO2: 24 mmol/L (ref 22–32)
Calcium: 8.1 mg/dL — ABNORMAL LOW (ref 8.9–10.3)
Chloride: 99 mmol/L (ref 98–111)
Creatinine, Ser: 1.3 mg/dL — ABNORMAL HIGH (ref 0.44–1.00)
GFR, Estimated: 38 mL/min — ABNORMAL LOW (ref >60)
Glucose, Bld: 105 mg/dL — ABNORMAL HIGH (ref 70–99)
Potassium: 4.8 mmol/L (ref 3.5–5.1)
Sodium: 132 mmol/L — ABNORMAL LOW (ref 135–145)

## 2020-05-27 LAB — CBC
HCT: 47.4 % — ABNORMAL HIGH (ref 36.0–46.0)
Hemoglobin: 15.6 g/dL — ABNORMAL HIGH (ref 12.0–15.0)
MCH: 28.9 pg (ref 26.0–34.0)
MCHC: 32.9 g/dL (ref 30.0–36.0)
MCV: 87.9 fL (ref 80.0–100.0)
Platelets: 206 10*3/uL (ref 150–400)
RBC: 5.39 MIL/uL — ABNORMAL HIGH (ref 3.87–5.11)
RDW: 19.9 % — ABNORMAL HIGH (ref 11.5–15.5)
WBC: 6.8 10*3/uL (ref 4.0–10.5)
nRBC: 0 % (ref 0.0–0.2)

## 2020-05-27 LAB — TYPE AND SCREEN
ABO/RH(D): B POS
Antibody Screen: NEGATIVE
Unit division: 0
Unit division: 0

## 2020-05-27 LAB — BPAM RBC
Blood Product Expiration Date: 202204072359
Blood Product Expiration Date: 202204082359
ISSUE DATE / TIME: 202203110856
ISSUE DATE / TIME: 202203110856
Unit Type and Rh: 7300
Unit Type and Rh: 7300

## 2020-05-27 MED ORDER — ACETAMINOPHEN 325 MG PO TABS
650.0000 mg | ORAL_TABLET | Freq: Four times a day (QID) | ORAL | Status: DC | PRN
  Administered 2020-05-27 – 2020-05-28 (×2): 650 mg
  Filled 2020-05-27 (×2): qty 2

## 2020-05-27 MED ORDER — APIXABAN 5 MG PO TABS: 5.0000 mg | ORAL_TABLET | Freq: Two times a day (BID) | ORAL | Status: DC

## 2020-05-27 MED ORDER — OXYCODONE HCL 5 MG PO TABS: 5.0000 mg | ORAL_TABLET | Freq: Four times a day (QID) | ORAL | Status: DC | PRN

## 2020-05-27 MED ORDER — ONDANSETRON HCL 4 MG PO TABS: 4.0000 mg | ORAL_TABLET | Freq: Four times a day (QID) | ORAL | Status: DC | PRN

## 2020-05-27 MED ORDER — SORBITOL 70 % SOLN
960.0000 mL | TOPICAL_OIL | Freq: Once | ORAL | Status: AC
  Administered 2020-05-27: 960 mL via RECTAL
  Filled 2020-05-27: qty 473

## 2020-05-27 MED ORDER — ACETAMINOPHEN 650 MG RE SUPP: 650.0000 mg | RECTAL | Status: DC | PRN

## 2020-05-27 MED ORDER — APIXABAN 5 MG PO TABS
5.0000 mg | ORAL_TABLET | Freq: Two times a day (BID) | ORAL | Status: DC
  Administered 2020-05-27 – 2020-05-29 (×5): 5 mg
  Filled 2020-05-27 (×5): qty 1

## 2020-05-27 MED ORDER — SIMETHICONE 40 MG/0.6ML PO SUSP
40.0000 mg | Freq: Three times a day (TID) | ORAL | Status: DC
  Administered 2020-05-27 (×4): 40 mg
  Filled 2020-05-27 (×6): qty 0.6

## 2020-05-27 MED ORDER — ONDANSETRON HCL 4 MG/2ML IJ SOLN: 4.0000 mg | Freq: Four times a day (QID) | INTRAMUSCULAR | Status: DC | PRN

## 2020-05-27 NOTE — Progress Notes (Signed)
Physical Therapy Treatment Patient Details Name: Denise [name] MRN: [id number] DOB: 09/02/1923 Today's Date: 05/27/2020    History of Present Illness Pt is 87 yo female admitted s/p fall with resultant right femoral  shaft fracture.  Underwent IM Nail 05/24/20.   W/u also revealed potential GI Bleed.  PMH significant for hypertension, nonischemic cardiomyopathy, heart block with pacer, hypothyroidism, chronic 2 L/min supplemental oxygen requirement, atrial fibrillation on Eliquis, CKD 3A, type 2 diabetes mellitus, and suspected dementia    PT Comments    Pt continues to be very anxious re: falling and remains to have limited active movement of R LE. Pt continues to require maxAx2 for OOB transfer to EOB. Educated son that pt is RLE  WBAT for transfers only per orders, son under the understanding she is WBAT on R LE for ambulation as well. Left message with Maralyn Sago, PA for clarification.  Pt remains appropriate for SNF upon d/c. Acute PT to cont to follow.   Follow Up Recommendations  SNF     Equipment Recommendations  None recommended by PT    Recommendations for Other Services       Precautions / Restrictions Precautions Precautions: Fall Restrictions Weight Bearing Restrictions: Yes RLE Weight Bearing: Weight bearing as tolerated (for transfers only)    Mobility  Bed Mobility Overal bed mobility: Needs Assistance Bed Mobility: Supine to Sit     Supine to sit: Max assist;+2 for physical assistance     General bed mobility comments: maxA for trunk elevation, modA for R LE mangement    Transfers Overall transfer level: Needs assistance Equipment used: 2 person hand held assist Transfers: Sit to/from [employer] Sit to Stand: Max assist;+2 physical assistance Stand pivot transfers: Max assist;+2 physical assistance       General transfer comment: attempted to use RW however unable to complete, 2 person front to front transfer with gait belt, unable to  achieve full upright posture, maxA for R LE advancement, pvt on L foot, no foot clearance  Ambulation/Gait             General Gait Details: unable   Stairs             Wheelchair Mobility    Modified Rankin (Stroke Patients Only)       Balance Overall balance assessment: Needs assistance Sitting-balance support: Bilateral upper extremity supported;Feet supported Sitting balance-Leahy Scale: Poor Sitting balance - Comments: dependent on UE support   Standing balance support: Bilateral upper extremity supported Standing balance-Leahy Scale: Zero Standing balance comment: dependent on external support                            Cognition Arousal/Alertness: Awake/alert Behavior During Therapy: Anxious Overall Cognitive Status: History of cognitive impairments - at baseline                                 General Comments: anxious re: falling, per son pt at baseline cognitively      Exercises Other Exercises Other Exercises: attempted passive R knee flexion, limited tolerance, pt able to complete about 1/3 Range in LAQ in sitting, stretched R knee into extension also    General Comments General comments (skin integrity, edema, etc.): incision covered by dressing      Pertinent Vitals/Pain Pain Assessment: Faces Faces Pain Scale: Hurts whole lot Pain Location: right LE Pain Descriptors / Indicators: Discomfort;Grimacing;Guarding  Pain Intervention(s): Limited activity within patient's tolerance    Home Living                      Prior Function            PT Goals (current goals can now be found in the care plan section) Progress towards PT goals: Progressing toward goals    Frequency    Min 3X/week      PT Plan Current plan remains appropriate    Co-evaluation              AM-PAC PT "6 Clicks" Mobility   Outcome Measure  Help needed turning from your back to your side while in a flat bed without  using bedrails?: A Lot Help needed moving from lying on your back to sitting on the side of a flat bed without using bedrails?: A Lot Help needed moving to and from a bed to a chair (including a wheelchair)?: A Lot Help needed standing up from a chair using your arms (e.g., wheelchair or bedside chair)?: A Lot Help needed to walk in hospital room?: Total Help needed climbing 3-5 steps with a railing? : Total 6 Click Score: 10    End of Session Equipment Utilized During Treatment: Gait belt Activity Tolerance: Patient tolerated treatment well;Patient limited by pain Patient left: in chair;with call bell/phone within reach;with chair alarm set;with family/visitor present Nurse Communication: Mobility status PT Visit Diagnosis: Repeated falls (R29.6);History of falling (Z91.81);Other abnormalities of gait and mobility (R26.89);Muscle weakness (generalized) (M62.81);Pain Pain - Right/Left: Right Pain - part of body: Leg     Time: 4356-8616 PT Time Calculation (min) (ACUTE ONLY): 17 min  Charges:  $Therapeutic Activity: 8-22 mins                     Lewis Shock, PT, DPT Acute Rehabilitation Services Pager #: [phone] Office #: [phone]    Iona Hansen 05/27/2020, 1:55 PM

## 2020-05-27 NOTE — Progress Notes (Signed)
[facility] Progress Note  Patient: Denise [name]    MWU:[id number]  DOA: 05/23/2020     Date of Service: the patient was seen and examined on 05/27/2020  Brief hospital course: Past medical history of HTN, PPM implant, hypothyroidism, COPD on chronic respiratory failure with 2 LPM, A. fib on Eliquis, CKD 3A, type II DM, dementia.  Presents with a fall leading to right leg pain found to have distant femur fracture. Currently plan is monitor postop recovery.  Resolution of severe constipation and bowel impaction.  Assessment and Plan: 1. Right femur fracture  Presented to [facility] ED in [place] after she was found down at home and had x-rays concerning for mildly displaced right femoral shaft fracture  Orthopedics were consulted. Patient underwent surgery on 3/11. Tolerated procedure very well. Weightbearing for transfer to right lower extremity.  Reinforcing dressing as needed. From orthopedic perspective okay to resume Eliquis. Appreciate assistance. PT OT recommends SNF.  2. ?Upper GI bleeding  Dilutional anemia as well as postoperative acute blood loss anemia. Patient's daughter reported that emesis in ED looked like dark blood  No vomiting seen here as well. Require 1 PRBC on 3/11 before surgery, although suspect this is secondary to dilution. After on PRBC transfusion hemoglobin came up to 8.7.  Currently hemoglobin is stable around 8, likely postop anemia.  Monitor.  3.   Chronic atrial fibrillation  CHADS-VASc at least 6 (age x2, CHF, HTN, DM, gender)  Resume Eliquis,   4. Non-ischemic cardiomyopathy  Severe tricuspid murmur. Chronic RV failure. EF had normalized on most recent echo in 2019 at which time she had mild LVH, severe LAE, mild AI, mild MR, and moderate TR She appears euvolemic  Hold diuretics currently actively receiving IV fluids.  5. Complete heart block  Pacer interrogated in January 2021, had normal device function    6. Type II DM    uncontrolled with hyperglycemia.  No long-term insulin use.  Associated with renal dysfunction. Check CBGs and use a low-intensity SSI for now    7. CKD IIIa  SCr was 1.00 in ED which appears to be her baseline Renally-dose medications, monitor   8. Hypertension  BP at goal, treat as-needed only for now    9. Chronic hypoxic respiratory failure  Appears stable on her usual 2 Lpm   10. Dementia, mild Per family the patient is disoriented at baseline, walks with a walker, and is able to feed, dress, and toilet on her own   21.  Cough. Atelectasis likely. Monitor. Low threshold to initiate antibiotics. Repeat chest x-ray on 3/13 unchanged.  12.  Distended stomach with intractable nausea and vomiting. Severe constipation. Patient reports recurrent nausea followed by vomiting. Due to severe distention of the stomach NG tube was inserted. Appears to be doing well after insertion with low intermittent suction. Continue replace potassium. Treat constipation enema and manual disimpaction.  Diet: Clear liquid diet, NG tube clamped.    DVT Prophylaxis:   SCDs Start: 05/24/20 1244 SCDs Start: 05/23/20 7253    Advance goals of care discussion: DNR  Family Communication: family was present at bedside, at the time of interview.   Disposition:  Status is: Inpatient  Remains inpatient appropriate because:Inpatient level of care appropriate due to severity of illness  Dispo: The patient is from: Home              Anticipated d/c is to: SNF              Patient currently  is not medically stable to d/c.   Difficult to place patient No  Subjective: No nausea no vomiting.  No fever no chills.  Continues to have significant output from the NG tube.  No BM.  Passing gas.  Physical Exam:  General: Appear in mild distress, no Rash; Oral Mucosa Clear, moist. no Abnormal Neck Mass Or lumps, Conjunctiva normal  Cardiovascular: S1 and S2 Present, no Murmur, Respiratory: good  respiratory effort, Bilateral Air entry present and CTA, no Crackles, no wheezes Abdomen: Bowel Sound present, Soft and no tenderness Extremities: no Pedal edema Neurology: alert and oriented to time, place, and person affect appropriate. no new focal deficit Gait not checked due to patient safety concerns  Vitals:   05/26/20 1936 05/27/20 0741 05/27/20 1445 05/27/20 2013  BP: (!) 114/53 (!) 117/49 (!) 121/48 (!) 125/51  Pulse: 69 69 70 70  Resp: 16 16 17    Temp: 99 F (37.2 C)  98.3 F (36.8 C) (!) 97.4 F (36.3 C)  TempSrc: Oral  Oral Oral  SpO2: 99% 97% 98% 95%  Weight:      Height:        Intake/Output Summary (Last 24 hours) at 05/27/2020 2036 Last data filed at 05/27/2020 2000 Gross per 24 hour  Intake 1211.73 ml  Output 1560 ml  Net -348.27 ml   Filed Weights   05/26/20 0500 05/26/20 1844  Weight: 72.9 kg 72.9 kg    Data Reviewed: I have personally reviewed and interpreted daily labs, tele strips, imaging. I reviewed all nursing notes, pharmacy notes, vitals, pertinent old records I have discussed plan of care as described above with RN and patient/family.  CBC: Recent Labs  Lab 05/24/20 0215 05/24/20 1059 05/25/20 0308 05/26/20 0136 05/27/20 0337  WBC 9.6 11.5* 9.5 11.6* 6.8  HGB 7.1* 8.7* 7.9* 8.0* 15.6*  HCT 22.5* 26.7* 24.4* 23.9* 47.4*  MCV 84.9 86.1 87.8 87.2 87.9  PLT 318 302 286 308 206   Basic Metabolic Panel: Recent Labs  Lab 05/23/20 0627 05/24/20 0215 05/25/20 0308 05/26/20 0136 05/27/20 0337  NA 134* 131* 134* 129* 132*  K 4.0 3.9 3.9 3.4* 4.8  CL 96* 95* 97* 95* 99  CO2 31 26 29 27 24   GLUCOSE 119* 120* 114* 113* 105*  BUN 34* 38* 39* 38* 42*  CREATININE 1.26* 1.46* 1.41* 1.42* 1.30*  CALCIUM 8.5* 8.2* 8.2* 7.9* 8.1*  MG  --  1.8 1.6* 2.0 2.0    Studies: DG Abd Portable 1V  Result Date: 05/27/2020 CLINICAL DATA:  Abdominal distension.  NG tube. EXAM: PORTABLE ABDOMEN - 1 VIEW COMPARISON:  05/26/2020. FINDINGS: NG tube in  the body of the stomach. Stomach decompressed. Gas in nondilated large and small bowel loops. Surgical clips in the right abdomen. IMPRESSION: NG tube in the body the stomach.  Nonobstructive bowel gas pattern. Electronically Signed   By: 05/29/2020 M.D.   On: 05/27/2020 09:53   ECHOCARDIOGRAM COMPLETE  Result Date: 05/27/2020    ECHOCARDIOGRAM REPORT   Patient Name:   Denise [name] Date of Exam: 05/27/2020 Medical Rec #:  [name]        Height:       60.0 in Accession #:    05/29/2020       Weight:       160.7 lb Date of Birth:  16-Feb-1924       BSA:          1.701 m Patient Age:    9 years  BP:           117/49 mmHg Patient Gender: F                HR:           69 bpm. Exam Location:  Inpatient Procedure: 2D Echo, Cardiac Doppler and Color Doppler Indications:     Murmur  History:         Patient has prior history of Echocardiogram examinations, most                  recent 04/28/2017. Pacemaker, COPD, Arrythmias:Atrial                  Fibrillation; Risk Factors:Hypertension. CKD. NICM.  Sonographer:     Ross Ludwig RDCS (AE) Referring Phys:  [id number] [facility] Diagnosing Phys: Kristeen Miss MD  Sonographer Comments: Suboptimal subcostal window. IMPRESSIONS  1. Left ventricular ejection fraction, by estimation, is 50 to 55%. The left ventricle has low normal function. The left ventricle demonstrates regional wall motion abnormalities (see scoring diagram/findings for description). Left ventricular diastolic  function could not be evaluated. There is the interventricular septum is flattened in diastole ('D' shaped left ventricle), consistent with right ventricular volume overload. There is severe akinesis of the left ventricular, basal inferoseptal wall.  2. Right ventricular systolic function is normal. The right ventricular size is moderately enlarged. There is severely elevated pulmonary artery systolic pressure.  3. Left atrial size was moderately dilated.  4. Right atrial size was  severely dilated.  5. The mitral valve is abnormal. Mild to moderate mitral valve regurgitation. Moderate mitral annular calcification.  6. Tricuspid valve regurgitation is severe.  7. The aortic valve is tricuspid. There is severe calcifcation of the aortic valve. There is moderate thickening of the aortic valve. Aortic valve regurgitation is trivial. Mild aortic valve stenosis. FINDINGS  Left Ventricle: Left ventricular ejection fraction, by estimation, is 50 to 55%. The left ventricle has low normal function. The left ventricle demonstrates regional wall motion abnormalities. Severe akinesis of the left ventricular, basal inferoseptal wall. The left ventricular internal cavity size was normal in size. There is no left ventricular hypertrophy. The interventricular septum is flattened in diastole ('D' shaped left ventricle), consistent with right ventricular volume overload. Left ventricular diastolic function could not be evaluated due to atrial fibrillation. Left ventricular diastolic function could not be evaluated.  LV Wall Scoring: The basal inferoseptal segment is akinetic. Right Ventricle: The right ventricular size is moderately enlarged. Right vetricular wall thickness was not well visualized. Right ventricular systolic function is normal. There is severely elevated pulmonary artery systolic pressure. Left Atrium: Left atrial size was moderately dilated. Right Atrium: Right atrial size was severely dilated. Pericardium: The pericardium was not well visualized. Mitral Valve: The mitral valve is abnormal. Moderate mitral annular calcification. Mild to moderate mitral valve regurgitation. Tricuspid Valve: The tricuspid valve is grossly normal. Tricuspid valve regurgitation is severe. Aortic Valve: The aortic valve is tricuspid. There is severe calcifcation of the aortic valve. There is moderate thickening of the aortic valve. There is moderate aortic valve annular calcification. Aortic valve regurgitation is  trivial. Aortic regurgitation PHT measures 367 msec. Mild aortic stenosis is present. Aortic valve mean gradient measures 13.3 mmHg. Aortic valve peak gradient measures 24.7 mmHg. Aortic valve area, by VTI measures 0.66 cm. Pulmonic Valve: The pulmonic valve was not well visualized. Pulmonic valve regurgitation is mild to moderate. Aorta: The aortic root and ascending aorta are structurally normal,  with no evidence of dilitation. IAS/Shunts: The atrial septum is grossly normal. Additional Comments: A device lead is visualized.  LEFT VENTRICLE PLAX 2D LVIDd:         4.80 cm LVIDs:         3.40 cm LV PW:         1.30 cm LV IVS:        0.90 cm LVOT diam:     1.70 cm LV SV:         33 LV SV Index:   19 LVOT Area:     2.27 cm  RIGHT VENTRICLE RV Basal diam:  4.30 cm RV Mid diam:    3.40 cm RV S prime:     7.83 cm/s TAPSE (M-mode): 2.0 cm LEFT ATRIUM             Index       RIGHT ATRIUM           Index LA diam:        5.30 cm 3.12 cm/m  RA Area:     35.60 cm LA Vol (A2C):   84.6 ml 49.74 ml/m RA Volume:   130.00 ml 76.43 ml/m LA Vol (A4C):   72.9 ml 42.86 ml/m LA Biplane Vol: 78.6 ml 46.21 ml/m  AORTIC VALVE AV Area (Vmax):    0.72 cm AV Area (Vmean):   0.69 cm AV Area (VTI):     0.66 cm AV Vmax:           248.33 cm/s AV Vmean:          172.000 cm/s AV VTI:            0.499 m AV Peak Grad:      24.7 mmHg AV Mean Grad:      13.3 mmHg LVOT Vmax:         78.33 cm/s LVOT Vmean:        52.567 cm/s LVOT VTI:          0.146 m LVOT/AV VTI ratio: 0.29 AI PHT:            367 msec  AORTA Ao Root diam: 3.20 cm Ao Asc diam:  3.70 cm TRICUSPID VALVE TR Peak grad:   72.2 mmHg TR Vmax:        425.00 cm/s  SHUNTS Systemic VTI:  0.15 m Systemic Diam: 1.70 cm Kristeen Miss MD Electronically signed by Kristeen Miss MD Signature Date/Time: 05/27/2020/10:35:04 AM    Final (Updated)     Scheduled Meds: . apixaban  5 mg Per Tube BID  . Chlorhexidine Gluconate Cloth  6 each Topical Q0600  . pantoprazole (PROTONIX) IV  40 mg  Intravenous Q1200  . simethicone  40 mg Per Tube TID AC & HS   Continuous Infusions: . sodium chloride 75 mL/hr at 05/27/20 1815   PRN Meds: acetaminophen **OR** acetaminophen, labetalol, morphine injection, ondansetron **OR** ondansetron (ZOFRAN) IV, oxyCODONE  Time spent: 35 minutes  Author: Lynden Oxford, MD [facility] 05/27/2020 8:36 PM  To reach On-call, see care teams to locate the attending and reach out via [email or web address]. Between 7PM-7AM, please contact night-coverage If you still have difficulty reaching the attending provider, please page the [facility] (Director on Call) for [facility] on amion for assistance.

## 2020-05-27 NOTE — Plan of Care (Signed)

## 2020-05-27 NOTE — Progress Notes (Signed)
  Echocardiogram 2D Echocardiogram has been performed.  Denise [name] 05/27/2020, 8:58 AM

## 2020-05-27 NOTE — TOC Progression Note (Addendum)
Transition of Care [facility]) - Progression Note    Patient Details  Name: Denise [name] MRN: [id number] Date of Birth: September 01, 1923  Transition of [facility]) CM/SW Contact  Carley Hammed, [place] Phone Number: 05/27/2020, 10:15 AM  Clinical Narrative:    CSW spoke with admissions from [facility] in [place], and they requested a referral be faxed over. CSW faxed referral and facility confirmed they received it. They will follow up when a decision has been made. CSW will continue to follow.  [facility] accepted this pt and advised pt will not be ready before Wednesday. CSW followed up with [facility] to discuss any charges that may accrue from transporting this far. Ptar noted the family would have to pay 100% of the cost. CSW spoke with [facility] and they are searching their vendors for a better option, plan to schedule transport for Thursday. Will follow up with son with any information.     Expected Discharge Plan: Skilled Nursing Facility Barriers to Discharge: Continued Medical Work up  Expected Discharge Plan and Services Expected Discharge Plan: Skilled Nursing Facility     Post Acute Care Choice: Skilled Nursing Facility Living arrangements for the past 2 months: Single Family Home                                       Social Determinants of Health (SDOH) Interventions    Readmission Risk Interventions No flowsheet data found.

## 2020-05-28 ENCOUNTER — Inpatient Hospital Stay (HOSPITAL_COMMUNITY): Payer: Medicare Other

## 2020-05-28 DIAGNOSIS — S72341A Displaced spiral fracture of shaft of right femur, initial encounter for closed fracture: Secondary | ICD-10-CM | POA: Diagnosis not present

## 2020-05-28 LAB — CBC
HCT: 25.5 % — ABNORMAL LOW (ref 36.0–46.0)
Hemoglobin: 8.1 g/dL — ABNORMAL LOW (ref 12.0–15.0)
MCH: 28.4 pg (ref 26.0–34.0)
MCHC: 31.8 g/dL (ref 30.0–36.0)
MCV: 89.5 fL (ref 80.0–100.0)
Platelets: 360 10*3/uL (ref 150–400)
RBC: 2.85 MIL/uL — ABNORMAL LOW (ref 3.87–5.11)
RDW: 19.5 % — ABNORMAL HIGH (ref 11.5–15.5)
WBC: 10.7 10*3/uL — ABNORMAL HIGH (ref 4.0–10.5)
nRBC: 0 % (ref 0.0–0.2)

## 2020-05-28 LAB — MAGNESIUM: Magnesium: 1.9 mg/dL (ref 1.7–2.4)

## 2020-05-28 LAB — BASIC METABOLIC PANEL
Anion gap: 9 (ref 5–15)
BUN: 47 mg/dL — ABNORMAL HIGH (ref 8–23)
CO2: 22 mmol/L (ref 22–32)
Calcium: 7.9 mg/dL — ABNORMAL LOW (ref 8.9–10.3)
Chloride: 100 mmol/L (ref 98–111)
Creatinine, Ser: 1.33 mg/dL — ABNORMAL HIGH (ref 0.44–1.00)
GFR, Estimated: 37 mL/min — ABNORMAL LOW (ref >60)
Glucose, Bld: 119 mg/dL — ABNORMAL HIGH (ref 70–99)
Potassium: 4.4 mmol/L (ref 3.5–5.1)
Sodium: 131 mmol/L — ABNORMAL LOW (ref 135–145)

## 2020-05-28 MED ORDER — SENNOSIDES-DOCUSATE SODIUM 8.6-50 MG PO TABS
2.0000 | ORAL_TABLET | Freq: Two times a day (BID) | ORAL | Status: DC
  Administered 2020-05-28 – 2020-06-03 (×10): 2 via ORAL
  Filled 2020-05-28 (×11): qty 2

## 2020-05-28 MED ORDER — LACTULOSE 10 GM/15ML PO SOLN
20.0000 g | Freq: Two times a day (BID) | ORAL | Status: DC
  Administered 2020-05-28 – 2020-06-03 (×12): 20 g via ORAL
  Filled 2020-05-28 (×13): qty 30

## 2020-05-28 MED ORDER — METOCLOPRAMIDE HCL 5 MG/ML IJ SOLN
5.0000 mg | Freq: Three times a day (TID) | INTRAMUSCULAR | Status: AC
  Administered 2020-05-28 – 2020-05-29 (×6): 5 mg via INTRAVENOUS
  Filled 2020-05-28 (×6): qty 2

## 2020-05-28 MED ORDER — SIMETHICONE 80 MG PO CHEW
80.0000 mg | CHEWABLE_TABLET | Freq: Four times a day (QID) | ORAL | Status: DC
  Administered 2020-05-28 – 2020-06-03 (×21): 80 mg via ORAL
  Filled 2020-05-28 (×21): qty 1

## 2020-05-28 NOTE — Care Management Important Message (Signed)
Important Message  Patient Details  Name: Denise [name] MRN: [id number] Date of Birth: 1923-05-18   Medicare Important Message Given:  Yes     [name] [name] 05/28/2020, 11:45 AM

## 2020-05-28 NOTE — Progress Notes (Signed)
Occupational Therapy Treatment Patient Details Name: Denise [name] MRN: [id number] DOB: February 05, 1924 Today's Date: 05/28/2020    History of present illness Pt is 87 yo female admitted s/p fall with resultant right femoral  shaft fracture.  Underwent IM Nail 05/24/20.   W/u also revealed potential GI Bleed.  PMH significant for hypertension, nonischemic cardiomyopathy, heart block with pacer, hypothyroidism, chronic 2 L/min supplemental oxygen requirement, atrial fibrillation on Eliquis, CKD 3A, type 2 diabetes mellitus, and suspected dementia   OT comments  Pt making progress with functional goals. Pt in recliner upon arrival with her son present. Pt able to don clean gown and perform grooming seated with support. OT and RN assisted pt back to bed ma x- total A +2 SPT. Pt anxious, fearful of falling and limited by pain. OT will continue to follow acutely to maximize level of function and safety  Follow Up Recommendations  SNF;Supervision/Assistance - 24 hour    Equipment Recommendations  3 in 1 bedside commode;Other (comment) (TBD at SNF)    Recommendations for Other Services      Precautions / Restrictions Precautions Precautions: Fall Restrictions Weight Bearing Restrictions: Yes RLE Weight Bearing: Weight bearing as tolerated       Mobility Bed Mobility Overal bed mobility: Needs Assistance Bed Mobility: Sit to Supine       Sit to supine: Total assist;+2 for physical assistance        Transfers Overall transfer level: Needs assistance   Transfers: Sit to/from Stand;Stand Pivot Transfers Sit to Stand: Max assist;+2 physical assistance Stand pivot transfers: Max assist;+2 physical assistance       General transfer comment: RN and OT assisted pt back to bed from recliner    Balance Overall balance assessment: Needs assistance Sitting-balance support: Bilateral upper extremity supported;Feet supported Sitting balance-Leahy Scale: Poor Sitting balance - Comments:  dependent on UE support   Standing balance support: Bilateral upper extremity supported Standing balance-Leahy Scale: Zero                             ADL either performed or assessed with clinical judgement   ADL Overall ADL's : Needs assistance/impaired     Grooming: Wash/dry hands;Wash/dry face;Oral care;Sitting;Set up;Supervision/safety Grooming Details (indicate cue type and reason): supported sitting in recliner         Upper Body Dressing : Minimal assistance Upper Body Dressing Details (indicate cue type and reason): donned clean gown seated in recliner     Toilet Transfer: Total assistance;+2 for safety/equipment;+2 for physical assistance;Cueing for safety;Cueing for sequencing Toilet Transfer Details (indicate cue type and reason): simulated to bed from recliner, RN and OT assisted pt Toileting- Clothing Manipulation and Hygiene: Total assistance;Bed level Toileting - Clothing Manipulation Details (indicate cue type and reason): rolling in bed to place bed pan and for hygiene with assist from OT and NT     Functional mobility during ADLs: Maximal assistance;Total assistance;+2 for physical assistance General ADL Comments: pt limited with following directions due to h/o dementia, required simple 1 step cues, HOH     Vision Patient Visual Report: No change from baseline     Perception     Praxis      Cognition Arousal/Alertness: Awake/alert Behavior During Therapy: Anxious Overall Cognitive Status: History of cognitive impairments - at baseline  Exercises     Shoulder Instructions       General Comments      Pertinent Vitals/ Pain       Pain Assessment: Faces Faces Pain Scale: Hurts even more Pain Location: R LE during transfer to bed from recliner Pain Descriptors / Indicators: Discomfort;Grimacing;Guarding Pain Intervention(s): Limited activity within patient's  tolerance;Monitored during session;Repositioned  Home Living                                          Prior Functioning/Environment              Frequency  Min 2X/week        Progress Toward Goals  OT Goals(current goals can now be found in the care plan section)  Progress towards OT goals: Progressing toward goals  Acute Rehab OT Goals Patient Stated Goal: get her strong enough to come home  Plan      Co-evaluation                 AM-PAC OT "6 Clicks" Daily Activity     Outcome Measure   Help from another person eating meals?: A Little Help from another person taking care of personal grooming?: A Little Help from another person toileting, which includes using toliet, bedpan, or urinal?: A Lot Help from another person bathing (including washing, rinsing, drying)?: A Lot Help from another person to put on and taking off regular upper body clothing?: A Little Help from another person to put on and taking off regular lower body clothing?: A Lot 6 Click Score: 15    End of Session Equipment Utilized During Treatment: Gait belt  OT Visit Diagnosis: Unsteadiness on feet (R26.81);Repeated falls (R29.6);Muscle weakness (generalized) (M62.81);History of falling (Z91.81);Pain   Activity Tolerance Patient limited by pain   Patient Left in bed;with call bell/phone within reach;with bed alarm set;with nursing/sitter in room   Nurse Communication Mobility status        Time: 1209-1232 OT Time Calculation (min): 23 min  Charges: OT General Charges $OT Visit: 1 Visit OT Treatments $Self Care/Home Management : 8-22 mins $Therapeutic Activity: 8-22 mins     Galen Manila 05/28/2020, 3:39 PM

## 2020-05-28 NOTE — Progress Notes (Addendum)
[facility] Progress Note  Patient: Denise [name]    DVV:[id number]  DOA: 05/23/2020     Date of Service: the patient was seen and examined on 05/28/2020  Brief hospital course: Past medical history of HTN, PPM implant, hypothyroidism, COPD on chronic respiratory failure with 2 LPM, A. fib on Eliquis, CKD 3A, type II DM, dementia.  Presents with a fall leading to right leg pain found to have distant femur fracture. Currently plan is monitor for improvement in oral intake and stabilization of lab work prior to discharge to SNF.  Assessment and Plan: 1.Right femur fracture SP intramedullary retrograde femoral nailing on 3/11 by Dr. Jena Gauss. Presented to [facility] ED in [place] after she was found down at home and had x-rays concerning for mildly displaced right femoral shaft fracture Orthopedics were consulted. Patient underwent surgery on 3/11. Tolerated procedure very well. Weightbearing for transfer to right lower extremity. Reinforcing dressing as needed. From orthopedic perspective okay to resume Eliquis. Appreciate assistance. PT OT recommends SNF.  2.Distended stomach with intractable nausea and vomiting. Severe constipation. Patient reports recurrent nausea followed by vomiting. Due to severe distention of the stomach NG tube was inserted. Appears to be doing well after insertion with low intermittent suction. Patient was given enema x1 as well. We will continue with bowel regimen. Continue with Reglan. Continue with simethicone. NG tube removed on 3/15. Tolerating full liquid diet.  Will advance to soft diet.  3.  Chronic atrial fibrillation Non-ischemic cardiomyopathy Severe tricuspid murmur. Chronic RV failure. EF had normalized on most recent echo in 2019at which time she had mild LVH, severe LAE, mild AI, mild MR, and moderate TR She appears euvolemic Diuretics on hold although may need to resume it ASAP. CHADS-VASc at least 48 (age x2, CHF,  HTN, DM, gender) Resume Eliquis.  4. ?Upper GI bleeding Dilutional anemia as well as postoperative acute blood loss anemia. Patient's daughter reported that emesis in ED looked like dark blood No vomiting seen here as well. Require 1 PRBC on 3/11 before surgery, although suspect this is secondary to dilution. After on PRBC transfusion hemoglobin came up to 8.7.   Currently hemoglobin is stable around 8, likely postop anemia.   5.Complete heart block Pacer interrogated in January 2021, had normal device function  6.Type II DM uncontrolled with hyperglycemia.  No long-term insulin use.  Associated with renal dysfunction. Check CBGs and use a low-intensity SSI for now  7.CKD IIIa SCr was 1.00 in ED which appears to be her baseline Renally-dose medications, monitor  8.Hypertension BP at goal, treat as-needed only for now   9. Chronic hypoxic respiratory failure  Appears stable on her usual 2 Lpm   10. Dementia, mild Per family the patient is disoriented at baseline, walks with a walker, and is able to feed, dress, and toilet on her own  12.  Cough. Atelectasis likely. Monitor. Low threshold to initiate antibiotics. Repeat chest x-ray on 3/13 unchanged.  12.  Obesity. Placing the patient at high risk for poor outcome as well as slow recovery. Body mass index is 31.39 kg/m.  Nutrition Problem: Increased nutrient needs Etiology: post-op healing Interventions: Interventions: Glucerna shake,MVI      Diet: Soft diet DVT Prophylaxis:   SCDs Start: 05/24/20 1244 SCDs Start: 05/23/20 0609 apixaban (ELIQUIS) tablet 5 mg    Advance goals of care discussion: DNR  Family Communication: family was present at bedside, at the time of interview.  The pt provided permission to discuss medical plan with the family.  Opportunity was given to ask question and all questions were answered satisfactorily.   Disposition:  Status is: Inpatient  Remains  inpatient appropriate because:IV treatments appropriate due to intensity of illness or inability to take PO  Dispo: The patient is from: Home              Anticipated d/c is to: SNF              Patient currently is not medically stable to d/c.   Difficult to place patient No  Subjective: No nausea no vomiting but no fever no chills.  Tolerating full liquid diet as well as clear liquid diet.  Had a bowel movement as well.  No blood.  Physical Exam:  General: Appear in mild distress, no Rash; Oral Mucosa Clear, moist. no Abnormal Neck Mass Or lumps, Conjunctiva normal  Cardiovascular: S1 and S2 Present, aortic systolic  Murmur, Respiratory: good respiratory effort, Bilateral Air entry present and faint Crackles, no wheezes Abdomen: Bowel Sound present, Soft and distended, no tenderness Extremities: no Pedal edema Neurology: alert and oriented to place and person affect appropriate. no new focal deficit Gait not checked due to patient safety concerns  Vitals:   05/28/20 0518 05/28/20 0734 05/28/20 1408 05/28/20 1408  BP: (!) 111/49 (!) 128/52 (!) 133/58 (!) 133/58  Pulse: 68 70 72 72  Resp: 17 17 17 17   Temp: 98 F (36.7 C) 98.1 F (36.7 C) 98.6 F (37 C) 98.6 F (37 C)  TempSrc: Oral Oral Oral Oral  SpO2: 95% 98% 95% 95%  Weight:      Height:        Intake/Output Summary (Last 24 hours) at 05/28/2020 1957 Last data filed at 05/28/2020 1700 Gross per 24 hour  Intake -  Output 875 ml  Net -875 ml   Filed Weights   05/26/20 0500 05/26/20 1844  Weight: 72.9 kg 72.9 kg    Data Reviewed: I have personally reviewed and interpreted daily labs, tele strips, imaging. I reviewed all nursing notes, pharmacy notes, vitals, pertinent old records I have discussed plan of care as described above with RN and patient/family.  CBC: Recent Labs  Lab 05/24/20 1059 05/25/20 0308 05/26/20 0136 05/27/20 0337 05/28/20 0319  WBC 11.5* 9.5 11.6* 6.8 10.7*  HGB 8.7* 7.9* 8.0* 15.6*  8.1*  HCT 26.7* 24.4* 23.9* 47.4* 25.5*  MCV 86.1 87.8 87.2 87.9 89.5  PLT 302 286 308 206 360   Basic Metabolic Panel: Recent Labs  Lab 05/24/20 0215 05/25/20 0308 05/26/20 0136 05/27/20 0337 05/28/20 0319  NA 131* 134* 129* 132* 131*  K 3.9 3.9 3.4* 4.8 4.4  CL 95* 97* 95* 99 100  CO2 26 29 27 24 22   GLUCOSE 120* 114* 113* 105* 119*  BUN 38* 39* 38* 42* 47*  CREATININE 1.46* 1.41* 1.42* 1.30* 1.33*  CALCIUM 8.2* 8.2* 7.9* 8.1* 7.9*  MG 1.8 1.6* 2.0 2.0 1.9    Studies: DG Abd Portable 1V  Result Date: 05/28/2020 CLINICAL DATA:  NG placement EXAM: PORTABLE ABDOMEN - 1 VIEW COMPARISON:  05/27/2020 FINDINGS: NG tube in the stomach. Tip in the body the stomach. Normal bowel gas pattern. Surgical clips in the upper abdomen. IMPRESSION: NG tip in the body the stomach.  Normal bowel gas pattern. Electronically Signed   By: 05/30/2020 M.D.   On: 05/28/2020 08:27    Scheduled Meds: . apixaban  5 mg Per Tube BID  . Chlorhexidine Gluconate Cloth  6 each Topical Q0600  .  lactulose  20 g Oral BID  . metoCLOPramide (REGLAN) injection  5 mg Intravenous TID  . senna-docusate  2 tablet Oral BID  . simethicone  80 mg Oral QID   Continuous Infusions: PRN Meds: acetaminophen **OR** acetaminophen, labetalol, morphine injection, ondansetron **OR** ondansetron (ZOFRAN) IV, oxyCODONE  Time spent: 35 minutes  Author: Lynden Oxford, MD [facility] 05/28/2020 7:57 PM  To reach On-call, see care teams to locate the attending and reach out via [email or web address]. Between 7PM-7AM, please contact night-coverage If you still have difficulty reaching the attending provider, please page the [facility] (Director on Call) for [facility] on amion for assistance.

## 2020-05-29 DIAGNOSIS — N1831 Chronic kidney disease, stage 3a: Secondary | ICD-10-CM | POA: Diagnosis not present

## 2020-05-29 DIAGNOSIS — S72341A Displaced spiral fracture of shaft of right femur, initial encounter for closed fracture: Secondary | ICD-10-CM | POA: Diagnosis not present

## 2020-05-29 DIAGNOSIS — J9611 Chronic respiratory failure with hypoxia: Secondary | ICD-10-CM | POA: Diagnosis not present

## 2020-05-29 DIAGNOSIS — I4891 Unspecified atrial fibrillation: Secondary | ICD-10-CM | POA: Diagnosis not present

## 2020-05-29 LAB — MAGNESIUM: Magnesium: 2 mg/dL (ref 1.7–2.4)

## 2020-05-29 MED ORDER — ACETAMINOPHEN 650 MG RE SUPP: 650.0000 mg | RECTAL | Status: DC | PRN

## 2020-05-29 MED ORDER — ONDANSETRON HCL 4 MG/2ML IJ SOLN
4.0000 mg | Freq: Four times a day (QID) | INTRAMUSCULAR | Status: DC | PRN
  Administered 2020-05-30: 4 mg via INTRAVENOUS
  Filled 2020-05-29: qty 2

## 2020-05-29 MED ORDER — ONDANSETRON HCL 4 MG PO TABS: 4.0000 mg | ORAL_TABLET | Freq: Four times a day (QID) | ORAL | Status: DC | PRN

## 2020-05-29 MED ORDER — ACETAMINOPHEN 325 MG PO TABS
650.0000 mg | ORAL_TABLET | Freq: Four times a day (QID) | ORAL | Status: DC | PRN
  Administered 2020-05-31 – 2020-06-03 (×3): 650 mg via ORAL
  Filled 2020-05-29 (×3): qty 2

## 2020-05-29 MED ORDER — APIXABAN 5 MG PO TABS
5.0000 mg | ORAL_TABLET | Freq: Two times a day (BID) | ORAL | Status: DC
  Administered 2020-05-29 – 2020-06-03 (×10): 5 mg via ORAL
  Filled 2020-05-29 (×10): qty 1

## 2020-05-29 NOTE — Plan of Care (Signed)

## 2020-05-29 NOTE — Progress Notes (Signed)
Physical Therapy Treatment Patient Details Name: Denise [name] MRN: [id number] DOB: 08-Apr-1923 Today's Date: 05/29/2020    History of Present Illness Pt is 87 yo female admitted s/p fall with resultant right femoral  shaft fracture.  Underwent IM Nail 05/24/20.   W/u also revealed potential GI Bleed.  PMH significant for hypertension, nonischemic cardiomyopathy, heart block with pacer, hypothyroidism, chronic 2 L/min supplemental oxygen requirement, atrial fibrillation on Eliquis, CKD 3A, type 2 diabetes mellitus, and suspected dementia    PT Comments    Pt continues to require max/total assist for OOB mobility. Pt with minimal active R LE ROM and minimal tolerance of R LE PROM. Pt unable to tolerate any WBing at this time. Pt continues to present with extremely distended abdomen also adding difficulty to ability to transfer. Stedy used today which was the safest as pt unable to clear bottom with physical assist to stand. Informed RN to use stedy to return pt back to bed. Continue to recommend SNF upon d/c.    Follow Up Recommendations  SNF     Equipment Recommendations  None recommended by PT    Recommendations for Other Services       Precautions / Restrictions Precautions Precautions: Fall Restrictions Weight Bearing Restrictions: Yes RLE Weight Bearing: Weight bearing as tolerated (for transfers only, no ambulation)    Mobility  Bed Mobility Overal bed mobility: Needs Assistance Bed Mobility: Supine to Sit;Rolling Rolling: Mod assist   Supine to sit: Max assist;+2 for physical assistance     General bed mobility comments: pt with BM, modA to roll on to L side, limited used of R UE due to poor shoulder ROM, maxA for LE management of EOB and trunk elevation    Transfers Overall transfer level: Needs assistance Equipment used:  (lift equip) Transfers: Sit to/from [employer] Sit to Stand: Max assist;+2 physical assistance Stand pivot transfers: Total  assist;+2 physical assistance;+2 safety/equipment (use of stedy)       General transfer comment: attempted to stand with 2 person assist however unable to clear bottom, pt with minimal assist and limited R LE WBing, able to stand pt in stedy and transfer safely to the chair, pt with L lateral lean due to not wanting to WB on R LE  Ambulation/Gait             General Gait Details: unable   Stairs             Wheelchair Mobility    Modified Rankin (Stroke Patients Only)       Balance Overall balance assessment: Needs assistance Sitting-balance support: Bilateral upper extremity supported;Feet supported Sitting balance-Leahy Scale: Poor Sitting balance - Comments: dependent on UE support   Standing balance support: Bilateral upper extremity supported Standing balance-Leahy Scale: Zero Standing balance comment: unable to achieve or maintain standing without total assist                            Cognition Arousal/Alertness: Awake/alert Behavior During Therapy: Anxious (re: falling) Overall Cognitive Status: History of cognitive impairments - at baseline                                 General Comments: pt with delayed response time but does follow simple commands but is self limiting due to fear of onset of pain and falling, pt with decreased insight to situation and safety  Exercises Other Exercises Other Exercises: AA R knee flex/ext in sitting, pt with limited tolerance, pt did initiate R LE extension however minimal    General Comments General comments (skin integrity, edema, etc.): pt with red bottom from loose stool and noted skin break down during hygiene (pt dependent)      Pertinent Vitals/Pain Pain Assessment: Faces Faces Pain Scale: Hurts even more Pain Location: R LE during transfer to bed from recliner Pain Descriptors / Indicators: Discomfort;Grimacing;Guarding Pain Intervention(s): Limited activity within  patient's tolerance    Home Living                      Prior Function            PT Goals (current goals can now be found in the care plan section) Progress towards PT goals: Progressing toward goals    Frequency    Min 3X/week      PT Plan Current plan remains appropriate    Co-evaluation              AM-PAC PT "6 Clicks" Mobility   Outcome Measure  Help needed turning from your back to your side while in a flat bed without using bedrails?: A Lot Help needed moving from lying on your back to sitting on the side of a flat bed without using bedrails?: A Lot Help needed moving to and from a bed to a chair (including a wheelchair)?: A Lot Help needed standing up from a chair using your arms (e.g., wheelchair or bedside chair)?: Total Help needed to walk in hospital room?: Total Help needed climbing 3-5 steps with a railing? : Total 6 Click Score: 9    End of Session Equipment Utilized During Treatment: Gait belt Activity Tolerance: Patient limited by pain Patient left: in chair;with call bell/phone within reach;with chair alarm set Nurse Communication: Mobility status PT Visit Diagnosis: Repeated falls (R29.6);History of falling (Z91.81);Other abnormalities of gait and mobility (R26.89);Muscle weakness (generalized) (M62.81);Pain Pain - Right/Left: Right Pain - part of body: Leg     Time: 0626-9485 PT Time Calculation (min) (ACUTE ONLY): 38 min  Charges:  $Therapeutic Exercise: 8-22 mins $Therapeutic Activity: 23-37 mins                     Lewis Shock, PT, DPT Acute Rehabilitation Services Pager #: [phone] Office #: [phone]    Iona Hansen 05/29/2020, 12:00 PM

## 2020-05-29 NOTE — Progress Notes (Signed)
ORTHOPAEDIC PROGRESS NOTE  SUBJECTIVE: Resting comfortably in hospital bed, pain manageable. No complaints.  Family at bedside  OBJECTIVE: Vitals:   05/29/20 0414 05/29/20 0728  BP: (!) 128/54 (!) 115/42  Pulse: 70 72  Resp: 18 16  Temp: 98.2 F (36.8 C) 98.3 F (36.8 C)  SpO2: 97% 96%   General: resting comfortably in hospital bed, NAD RLE: Dressings removed, clean, dry, intact no significant tenderness with palpation through the thigh or lower leg.  Tolerates ankle dorsiflexion/plantarflexion.  Able to wiggle her toes. Endorses distal sensation. Warm well perfused foot.   ASSESSMENT: Denise [name] is a 87 y.o. female  s/p RETROGRADE INTRAMEDULLARY NAILING RIGTH FEMUR 05/24/2020   PLAN: Weightbearing: weight-bear for transfers to the right lower extremity Insicional and dressing care: Change as needed.  Okay to leave open to air Orthopedic device(s): None VTE prophylaxis: Eliquis, SCDs ABLA: Hgb 8.1 yesterday. Continue to monitor.  Pain control: PRN pain medications, preferring oral medications Dispo: Therapies as tolerated, PT/OT recommending SNF.  Patient okay for discharge from Ortho standpoint.  TOC following.  Follow - up plan: We will continue to follow while in the hospital, plan for outpatient follow-up 2 weeks after hospital discharge for repeat x-rays and wound check   Contact information: Truitt Merle, MD, Ulyses Southward, PA-C  After hours and holidays please check [email or web address] for group call information for [facility]   Bonnee Zertuche A. Ladonna Snide [facility] [phone] (office) [email or web address]

## 2020-05-29 NOTE — Plan of Care (Addendum)
Rectal tube place, patient tolerated it well. Patient having multiple runny stool and passing gas.  Critical Value: Lactic Acid 2.5 [facility] notified: Zierle-Ghosh @0500    Problem: Education: Goal: Knowledge of General Education information will improve Description: Including pain rating scale, medication(s)/side effects and non-pharmacologic comfort measures Outcome: Progressing   Problem: Pain Managment: Goal: General experience of comfort will improve Outcome: Progressing   Problem: Safety: Goal: Ability to remain free from injury will improve Outcome: Progressing   Problem: Skin Integrity: Goal: Risk for impaired skin integrity will decrease Outcome: Progressing

## 2020-05-29 NOTE — Progress Notes (Signed)
[facility] Progress Note  Patient: Denise [name]    VQM:[id number]  DOA: 05/23/2020     Date of Service: the patient was seen and examined on 05/29/2020  Brief hospital course: Past medical history of HTN, PPM implant, hypothyroidism, COPD on chronic respiratory failure with 2 LPM, A. fib on Eliquis, CKD 3A, type II DM, dementia.  Presents with a fall leading to right leg pain found to have distant femur fracture. Currently plan is monitor for improvement in oral intake and stabilization of lab work prior to discharge to SNF.  Assessment and Plan: 1.Right femur fracture SP intramedullary retrograde femoral nailing on 3/11 by Dr. Jena Gauss. Presented to [facility] ED in [place] after she was found down at home and had x-rays concerning for mildly displaced right femoral shaft fracture Orthopedics were consulted. Patient underwent surgery on 3/11. Tolerated procedure very well. Weightbearing for transfer to right lower extremity. Reinforcing dressing as needed. From orthopedic perspective okay to resume Eliquis. Appreciate assistance. PT OT recommends SNF.  2.Distended stomach with intractable nausea and vomiting. Severe constipation. Patient reports recurrent nausea followed by vomiting. Due to severe distention of the stomach NG tube was inserted. Appears to be doing well after insertion with low intermittent suction. Patient was given enema x1 as well. We will continue with bowel regimen. Continue with Reglan. Continue with simethicone. NG tube removed on 3/15. Tolerating full liquid diet.  Will advance to soft diet. Abd distention appears to have recurred  Xray imaging showed bowel gas Will place rectal tube to decompress bowel  3.  Chronic atrial fibrillation Non-ischemic cardiomyopathy Severe tricuspid murmur. Chronic RV failure. EF had normalized on most recent echo in 2019at which time she had mild LVH, severe LAE, mild AI, mild MR, and moderate  TR She appears euvolemic Diuretics on hold although may need to resume it ASAP. CHADS-VASc at least 38 (age x2, CHF, HTN, DM, gender) Resume Eliquis.  4. ?Upper GI bleeding Dilutional anemia as well as postoperative acute blood loss anemia. Patient's daughter reported that emesis in ED looked like dark blood No vomiting seen here as well. Require 1 PRBC on 3/11 before surgery, although suspect this is secondary to dilution. After on PRBC transfusion hemoglobin came up to 8.7.   Currently hemoglobin is stable around 8, likely postop anemia.   5.Complete heart block Pacer interrogated in January 2021, had normal device function  6.Type II DM uncontrolled with hyperglycemia.  No long-term insulin use.  Associated with renal dysfunction. Check CBGs and use a low-intensity SSI for now  7.CKD IIIa SCr was 1.00 in ED which appears to be her baseline Renally-dose medications, monitor  8.Hypertension BP at goal, treat as-needed only for now   9. Chronic hypoxic respiratory failure  Appears stable on her usual 2 Lpm   10. Dementia, mild Per family the patient is disoriented at baseline, walks with a walker, and is able to feed, dress, and toilet on her own  37.  Cough. Atelectasis likely. Monitor. Low threshold to initiate antibiotics. Repeat chest x-ray on 3/13 unchanged.  12.  Obesity. Placing the patient at high risk for poor outcome as well as slow recovery. Body mass index is 31.39 kg/m.  Nutrition Problem: Increased nutrient needs Etiology: post-op healing Interventions: Interventions: Glucerna shake,MVI      Diet: Soft diet DVT Prophylaxis:   SCDs Start: 05/24/20 1244 SCDs Start: 05/23/20 0609 apixaban (ELIQUIS) tablet 5 mg    Advance goals of care discussion: DNR  Family Communication: family was present  at bedside, at the time of interview.  The pt provided permission to discuss medical plan with the family. Opportunity was given to  ask question and all questions were answered satisfactorily.   Disposition:  Status is: Inpatient  Remains inpatient appropriate because:IV treatments appropriate due to intensity of illness or inability to take PO  Dispo: The patient is from: Home              Anticipated d/c is to: SNF              Patient currently is not medically stable to d/c.   Difficult to place patient No  Subjective: Her son reports that she has been having bowel movements.  Tolerating solid diet.  Abdomen appears distended.  Physical Exam:  General exam: Alert, awake, oriented x 3 Respiratory system: Clear to auscultation. Respiratory effort normal. Cardiovascular system:RRR. No murmurs, rubs, gallops. Gastrointestinal system: Abdomen is distended, soft and nontender. No organomegaly or masses felt. Normal bowel sounds heard. Central nervous system: Alert and oriented. No focal neurological deficits. Extremities: No C/C/E, +pedal pulses Skin: No rashes, lesions or ulcers Psychiatry: Judgement and insight appear normal. Mood & affect appropriate.    Vitals:   05/29/20 0414 05/29/20 0728 05/29/20 1500 05/29/20 1919  BP: (!) 128/54 (!) 115/42 (!) 118/40 (!) 135/52  Pulse: 70 72 70 70  Resp: 18 16 17 16   Temp: 98.2 F (36.8 C) 98.3 F (36.8 C) 98.2 F (36.8 C) 97.9 F (36.6 C)  TempSrc: Oral   Oral  SpO2: 97% 96% 98% 97%  Weight:      Height:        Intake/Output Summary (Last 24 hours) at 05/29/2020 2028 Last data filed at 05/29/2020 1700 Gross per 24 hour  Intake 360 ml  Output 500 ml  Net -140 ml   Filed Weights   05/26/20 0500 05/26/20 1844  Weight: 72.9 kg 72.9 kg    Data Reviewed: I have personally reviewed and interpreted daily labs, tele strips, imaging. I reviewed all nursing notes, pharmacy notes, vitals, pertinent old records I have discussed plan of care as described above with RN and patient/family.  CBC: Recent Labs  Lab 05/24/20 1059 05/25/20 0308 05/26/20 0136  05/27/20 0337 05/28/20 0319  WBC 11.5* 9.5 11.6* 6.8 10.7*  HGB 8.7* 7.9* 8.0* 15.6* 8.1*  HCT 26.7* 24.4* 23.9* 47.4* 25.5*  MCV 86.1 87.8 87.2 87.9 89.5  PLT 302 286 308 206 360   Basic Metabolic Panel: Recent Labs  Lab 05/24/20 0215 05/25/20 0308 05/26/20 0136 05/27/20 0337 05/28/20 0319 05/29/20 0337  NA 131* 134* 129* 132* 131*  --   K 3.9 3.9 3.4* 4.8 4.4  --   CL 95* 97* 95* 99 100  --   CO2 26 29 27 24 22   --   GLUCOSE 120* 114* 113* 105* 119*  --   BUN 38* 39* 38* 42* 47*  --   CREATININE 1.46* 1.41* 1.42* 1.30* 1.33*  --   CALCIUM 8.2* 8.2* 7.9* 8.1* 7.9*  --   MG 1.8 1.6* 2.0 2.0 1.9 2.0    Studies: No results found.  Scheduled Meds: . apixaban  5 mg Oral BID  . Chlorhexidine Gluconate Cloth  6 each Topical Q0600  . lactulose  20 g Oral BID  . metoCLOPramide (REGLAN) injection  5 mg Intravenous TID  . senna-docusate  2 tablet Oral BID  . simethicone  80 mg Oral QID   Continuous Infusions: PRN Meds: acetaminophen **OR** acetaminophen,  labetalol, morphine injection, ondansetron **OR** ondansetron (ZOFRAN) IV, oxyCODONE  Time spent: 35 minutes  Author: Erick Blinks, MD [facility] 05/29/2020 8:28 PM  To reach On-call, see care teams to locate the attending and reach out via [email or web address]. Between 7PM-7AM, please contact night-coverage If you still have difficulty reaching the attending provider, please page the [facility] (Director on Call) for [facility] on amion for assistance.

## 2020-05-29 NOTE — Plan of Care (Signed)

## 2020-05-30 ENCOUNTER — Inpatient Hospital Stay (HOSPITAL_COMMUNITY): Payer: Medicare Other

## 2020-05-30 DIAGNOSIS — I4891 Unspecified atrial fibrillation: Secondary | ICD-10-CM | POA: Diagnosis not present

## 2020-05-30 DIAGNOSIS — J9611 Chronic respiratory failure with hypoxia: Secondary | ICD-10-CM | POA: Diagnosis not present

## 2020-05-30 DIAGNOSIS — S72341A Displaced spiral fracture of shaft of right femur, initial encounter for closed fracture: Secondary | ICD-10-CM | POA: Diagnosis not present

## 2020-05-30 DIAGNOSIS — N1831 Chronic kidney disease, stage 3a: Secondary | ICD-10-CM | POA: Diagnosis not present

## 2020-05-30 LAB — CBC
HCT: 26 % — ABNORMAL LOW (ref 36.0–46.0)
Hemoglobin: 8.6 g/dL — ABNORMAL LOW (ref 12.0–15.0)
MCH: 29.5 pg (ref 26.0–34.0)
MCHC: 33.1 g/dL (ref 30.0–36.0)
MCV: 89 fL (ref 80.0–100.0)
Platelets: 393 10*3/uL (ref 150–400)
RBC: 2.92 MIL/uL — ABNORMAL LOW (ref 3.87–5.11)
RDW: 20.5 % — ABNORMAL HIGH (ref 11.5–15.5)
WBC: 10.1 10*3/uL (ref 4.0–10.5)
nRBC: 0 % (ref 0.0–0.2)

## 2020-05-30 LAB — BASIC METABOLIC PANEL
Anion gap: 7 (ref 5–15)
BUN: 46 mg/dL — ABNORMAL HIGH (ref 8–23)
CO2: 25 mmol/L (ref 22–32)
Calcium: 8 mg/dL — ABNORMAL LOW (ref 8.9–10.3)
Chloride: 97 mmol/L — ABNORMAL LOW (ref 98–111)
Creatinine, Ser: 1.25 mg/dL — ABNORMAL HIGH (ref 0.44–1.00)
GFR, Estimated: 40 mL/min — ABNORMAL LOW (ref >60)
Glucose, Bld: 119 mg/dL — ABNORMAL HIGH (ref 70–99)
Potassium: 4 mmol/L (ref 3.5–5.1)
Sodium: 129 mmol/L — ABNORMAL LOW (ref 135–145)

## 2020-05-30 LAB — MAGNESIUM: Magnesium: 1.9 mg/dL (ref 1.7–2.4)

## 2020-05-30 LAB — LACTIC ACID, PLASMA
Lactic Acid, Venous: 2.5 mmol/L (ref 0.5–1.9)
Lactic Acid, Venous: 1.8 mmol/L (ref 0.5–1.9)

## 2020-05-30 MED ORDER — ENSURE ENLIVE PO LIQD
237.0000 mL | Freq: Three times a day (TID) | ORAL | Status: DC
  Administered 2020-05-30 – 2020-06-03 (×9): 237 mL via ORAL

## 2020-05-30 MED ORDER — SODIUM CHLORIDE 0.9 % IV BOLUS
500.0000 mL | Freq: Once | INTRAVENOUS | Status: AC
  Administered 2020-05-30: 500 mL via INTRAVENOUS

## 2020-05-30 MED ORDER — BARIUM SULFATE 2.1 % PO SUSP
ORAL | Status: AC
  Filled 2020-05-30: qty 2

## 2020-05-30 MED ORDER — METOCLOPRAMIDE HCL 5 MG/ML IJ SOLN
10.0000 mg | Freq: Once | INTRAMUSCULAR | Status: AC
  Administered 2020-05-31: 10 mg via INTRAVENOUS
  Filled 2020-05-30: qty 2

## 2020-05-30 NOTE — Progress Notes (Signed)
[facility] Progress Note  Patient: Denise [name]    VPX:[id number]  DOA: 05/23/2020     Date of Service: the patient was seen and examined on 05/30/2020  Brief hospital course: Past medical history of HTN, PPM implant, hypothyroidism, COPD on chronic respiratory failure with 2 LPM, A. fib on Eliquis, CKD 3A, type II DM, dementia.  Presents with a fall leading to right leg pain found to have distant femur fracture. Currently plan is monitor for improvement in oral intake and stabilization of lab work prior to discharge to SNF.  Assessment and Plan: 1.Right femur fracture SP intramedullary retrograde femoral nailing on 3/11 by Dr. Jena Gauss. Presented to [facility] ED in [place] after she was found down at home and had x-rays concerning for mildly displaced right femoral shaft fracture Orthopedics were consulted. Patient underwent surgery on 3/11. Tolerated procedure very well. Weightbearing for transfer to right lower extremity. Reinforcing dressing as needed. From orthopedic perspective okay to resume Eliquis. Appreciate assistance. PT OT recommends SNF.  2.Distended abdomen with intractable nausea and vomiting. Severe constipation. Patient reports recurrent nausea followed by vomiting. Due to severe distention of the stomach NG tube was inserted. Appears to be doing well after insertion with low intermittent suction. Patient was given enema x1 as well. We will continue with bowel regimen. Continue with Reglan. Continue with simethicone. NG tube removed on 3/15. Tolerating full liquid diet.  Will advance to soft diet. Abd distention appears to have recurred on 3/16  Xray imaging showed bowel gas Will place rectal tube to decompress bowel Check CT abdomen  3.  Chronic atrial fibrillation Non-ischemic cardiomyopathy Severe tricuspid murmur. Chronic RV failure. EF had normalized on most recent echo in 2019at which time she had mild LVH, severe LAE, mild AI,  mild MR, and moderate TR She appears euvolemic Diuretics on hold although may need to resume it ASAP. CHADS-VASc at least 59 (age x2, CHF, HTN, DM, gender) Resume Eliquis.  4. ?Upper GI bleeding Dilutional anemia as well as postoperative acute blood loss anemia. Patient's daughter reported that emesis in ED looked like dark blood No vomiting seen here as well. Require 1 PRBC on 3/11 before surgery, although suspect this is secondary to dilution. After on PRBC transfusion hemoglobin came up to 8.7.   Currently hemoglobin is stable around 8, likely postop anemia.   5.Complete heart block Pacer interrogated in January 2021, had normal device function  6.Type II DM uncontrolled with hyperglycemia.  No long-term insulin use.  Associated with renal dysfunction. Check CBGs and use a low-intensity SSI for now  7.CKD IIIa SCr was 1.00 in ED which appears to be her baseline Renally-dose medications, monitor  8.Hypertension BP at goal, treat as-needed only for now   9. Chronic hypoxic respiratory failure  Appears stable on her usual 2 Lpm   10. Dementia, mild Per family the patient is disoriented at baseline, walks with a walker, and is able to feed, dress, and toilet on her own  52.  Cough. Atelectasis likely. Monitor. Low threshold to initiate antibiotics. Repeat chest x-ray on 3/13 unchanged.  12.  Obesity. Placing the patient at high risk for poor outcome as well as slow recovery. Body mass index is 31.39 kg/m.  Nutrition Problem: Increased nutrient needs Etiology: post-op healing Interventions: Interventions: Ensure Enlive (each supplement provides 350kcal and 20 grams of protein),MVI      Diet: Soft diet DVT Prophylaxis:   SCDs Start: 05/24/20 1244 SCDs Start: 05/23/20 0609 apixaban (ELIQUIS) tablet 5 mg  Advance goals of care discussion: DNR  Family Communication: family was present at bedside, at the time of interview.  The pt  provided permission to discuss medical plan with the family. Opportunity was given to ask question and all questions were answered satisfactorily.   Disposition:  Status is: Inpatient  Remains inpatient appropriate because:IV treatments appropriate due to intensity of illness or inability to take PO  Dispo: The patient is from: Home              Anticipated d/c is to: SNF              Patient currently is not medically stable to d/c.   Difficult to place patient No  Subjective: rectal tube placed overnight and she was passing gas and was having some bowel movements. Abdomen remains distended  Physical Exam: General exam: Alert, awake, oriented x 3 Respiratory system: Clear to auscultation. Respiratory effort normal. Cardiovascular system:RRR. No murmurs, rubs, gallops. Gastrointestinal system: Abdomen is distended, soft and nontender. No organomegaly or masses felt. Normal bowel sounds heard. Central nervous system: Alert and oriented. No focal neurological deficits. Extremities: No C/C/E, +pedal pulses Skin: No rashes, lesions or ulcers Psychiatry: Judgement and insight appear normal. Mood & affect appropriate.     Vitals:   05/29/20 1500 05/29/20 1919 05/30/20 0337 05/30/20 0822  BP: (!) 118/40 (!) 135/52 (!) 115/46 (!) 119/48  Pulse: 70 70 72 73  Resp: 17 16 17 17   Temp: 98.2 F (36.8 C) 97.9 F (36.6 C) 98.1 F (36.7 C) 98.3 F (36.8 C)  TempSrc:  Oral  Oral  SpO2: 98% 97% 96% 94%  Weight:      Height:        Intake/Output Summary (Last 24 hours) at 05/30/2020 1805 Last data filed at 05/30/2020 0300 Gross per 24 hour  Intake -  Output 450 ml  Net -450 ml   Filed Weights   05/26/20 0500 05/26/20 1844  Weight: 72.9 kg 72.9 kg    Data Reviewed: I have personally reviewed and interpreted daily labs, tele strips, imaging. I reviewed all nursing notes, pharmacy notes, vitals, pertinent old records I have discussed plan of care as described above with RN and  patient/family.  CBC: Recent Labs  Lab 05/25/20 0308 05/26/20 0136 05/27/20 0337 05/28/20 0319 05/30/20 0219  WBC 9.5 11.6* 6.8 10.7* 10.1  HGB 7.9* 8.0* 15.6* 8.1* 8.6*  HCT 24.4* 23.9* 47.4* 25.5* 26.0*  MCV 87.8 87.2 87.9 89.5 89.0  PLT 286 308 206 360 393   Basic Metabolic Panel: Recent Labs  Lab 05/25/20 0308 05/26/20 0136 05/27/20 0337 05/28/20 0319 05/29/20 0337 05/30/20 0219  NA 134* 129* 132* 131*  --  129*  K 3.9 3.4* 4.8 4.4  --  4.0  CL 97* 95* 99 100  --  97*  CO2 29 27 24 22   --  25  GLUCOSE 114* 113* 105* 119*  --  119*  BUN 39* 38* 42* 47*  --  46*  CREATININE 1.41* 1.42* 1.30* 1.33*  --  1.25*  CALCIUM 8.2* 7.9* 8.1* 7.9*  --  8.0*  MG 1.6* 2.0 2.0 1.9 2.0 1.9    Studies: No results found.  Scheduled Meds: . apixaban  5 mg Oral BID  . Barium Sulfate      . Chlorhexidine Gluconate Cloth  6 each Topical Q0600  . feeding supplement  237 mL Oral TID BM  . lactulose  20 g Oral BID  . senna-docusate  2 tablet  Oral BID  . simethicone  80 mg Oral QID   Continuous Infusions: PRN Meds: acetaminophen **OR** acetaminophen, labetalol, morphine injection, ondansetron **OR** ondansetron (ZOFRAN) IV, oxyCODONE  Time spent: 35 minutes  Author: Erick Blinks, MD [facility] 05/30/2020 6:05 PM  To reach On-call, see care teams to locate the attending and reach out via [email or web address]. Between 7PM-7AM, please contact night-coverage If you still have difficulty reaching the attending provider, please page the [facility] (Director on Call) for [facility] on amion for assistance.

## 2020-05-30 NOTE — Plan of Care (Signed)

## 2020-05-30 NOTE — Progress Notes (Signed)
Nutrition Follow-up  DOCUMENTATION CODES:   Not applicable  INTERVENTION:   -Ensure Enlive TID, each supplement provides 350 kcal, 20 grams protein   -continue MVI with minerals po daily  NUTRITION DIAGNOSIS:   Increased nutrient needs related to post-op healing as evidenced by estimated needs.  Ongoing.  GOAL:   Patient will meet greater than or equal to 90% of their needs  Progressing.   MONITOR:   PO intake,Supplement acceptance,Diet advancement,Weight trends,I & O's,Labs,Skin  REASON FOR ASSESSMENT:   Consult Assessment of nutrition requirement/status,Hip fracture protocol  ASSESSMENT:   Denise [name] is a 87 y.o. female with medical history significant for hypertension, nonischemic cardiomyopathy, heart block with pacer, hypothyroidism, chronic 2 L/min supplemental oxygen requirement, atrial fibrillation on Eliquis, CKD 3A, type 2 diabetes mellitus, and suspected dementia per family who presents to the emergency department with right leg pain after she was found on the floor  3/11 - Intramedullary nailing of R femur fracture  Per MD notes, pt lives with her son, is able to self feed and uses a walker at baseline. Pt's daughter reported pt vomited what appeared to be dark blood. Pt had distended stomach with intractable n/v and severe constipation which was resolved with NG tube insertion and suppository. However distention recurred on 3/16, Xray showed bowel gas, rectal tube placed to decompress bowel on 3/17. Pt is being monitored for improvement in oral intake, stabilization of lab work prior to d/c to SNF.   Per chart meal documentation, pt has been consuming 0-100% of meals with an average of 45% meal completion. Spoke with pt and pt's son at bedside. Pt's son reports that pt has had a poor appetite for approximately a year or more and reports that pt lost her taste about 30 years ago when she got a pacemaker. However, he mentioned that pt will eat whatever is put  in front of her. Pt's son reports that pt's usual intake is:  Breakfast - cereal and milk, cut up fruit  Lunch (mentions is more like a snack) - chips, nutty candy bar or applesauce Dinner - meat, starch and vegetable  Pt's son reports that pt receives meals on wheels and also lives with her other son he cooks dinner for her most days. Pt's son reports that pt drinks 1 Ensure a day.   Pt vomited multiple times during visit. Pt's son reports that pt does experience frequent bloating, constipation, vomiting and gas. Per MD notes, these symptoms are currently being addressed.   Unable to get UBW as son was unsure, however reports that pt's weights have been consistent within the past year. Pt's weights reviewed and show a steady weight for the past year.  Current weight: 160.72#   Pt's son reports that pt uses a walker at home to ambulate.  Meds reviewed: lactulose (BID), senekot (BID), mylicon (QID) Labs reviewed: Sodium (129, low), CBG (103-131)  I&O's reviewed: -1,159.8 L since admit  UOP: 750 mL x 24 hrs  NUTRITION - FOCUSED PHYSICAL EXAM:  Unable to perform NFPE nutrition exam during time of visit due to pt vomiting.  Diet Order:   Diet Order            DIET SOFT Room service appropriate? Yes; Fluid consistency: Thin  Diet effective 1400                 EDUCATION NEEDS:   No education needs have been identified at this time  Skin:  Skin Assessment: Skin Integrity Issues: Skin  Integrity Issues:: Incisions Incisions: closed R thigh, R sacrum  Last BM:  3/16 (type 6)  Height:   Ht Readings from Last 1 Encounters:  05/26/20 5' (1.524 m)    Weight:   Wt Readings from Last 1 Encounters:  05/26/20 72.9 kg    Ideal Body Weight:  45.5 kg  BMI:  Body mass index is 31.39 kg/m.  Estimated Nutritional Needs:   Kcal:  1350-1550  Protein:  60-75 grams  Fluid:  > 1.3 L    John Giovanni, Dietetic Intern 05/30/2020 3:12 PM

## 2020-05-30 NOTE — TOC Progression Note (Signed)
Transition of Care [facility]) - Progression Note    Patient Details  Name: Denise [name] MRN: [id number] Date of Birth: November 22, 1923  Transition of Care [facility]) CM/SW Contact  Epifanio Lesches, RN Phone Number: 05/30/2020, 10:17 AM  Clinical Narrative:    Once pt medically ready d/c plan will be transitioning to [facility], .Address: [address], [place], [place] [address], [phone]. Admission liaison, [phone]. Pt will need transportation to [facility]. [facility] is > 75 miles from [facility] which per [facility]'s transportation services will require approval. NCM to complete form for request and f/u with determination once closer to d/c date. Form place inside of pt's chart  TOC team will continue to monitor and assist with TOC needs...   Expected Discharge Plan: Skilled Nursing Facility Barriers to Discharge: Continued Medical Work up  Expected Discharge Plan and Services Expected Discharge Plan: Skilled Nursing Facility     Post Acute Care Choice: Skilled Nursing Facility Living arrangements for the past 2 months: Single Family Home                                       Social Determinants of Health (SDOH) Interventions    Readmission Risk Interventions No flowsheet data found.

## 2020-05-30 NOTE — Progress Notes (Signed)
OT Cancellation Note  Patient Details Name: Denise [name] MRN: [id number] DOB: [birth year]   Cancelled Treatment:    Reason Eval/Treat Not Completed: Fatigue/lethargy limiting ability to participate. Pt resting soundly; her son reports that she has just been able to "settle down" to sleep/get some rest and has not had a good day, abdomen distended. CT planned for this evening or tomorrow  Galen Manila 05/30/2020, 4:58 PM

## 2020-05-31 ENCOUNTER — Inpatient Hospital Stay (HOSPITAL_COMMUNITY): Payer: Medicare Other

## 2020-05-31 DIAGNOSIS — N1831 Chronic kidney disease, stage 3a: Secondary | ICD-10-CM | POA: Diagnosis not present

## 2020-05-31 DIAGNOSIS — I4891 Unspecified atrial fibrillation: Secondary | ICD-10-CM | POA: Diagnosis not present

## 2020-05-31 DIAGNOSIS — J9611 Chronic respiratory failure with hypoxia: Secondary | ICD-10-CM | POA: Diagnosis not present

## 2020-05-31 DIAGNOSIS — S72341A Displaced spiral fracture of shaft of right femur, initial encounter for closed fracture: Secondary | ICD-10-CM | POA: Diagnosis not present

## 2020-05-31 HISTORY — PX: IR PARACENTESIS: IMG2679

## 2020-05-31 LAB — MAGNESIUM: Magnesium: 2 mg/dL (ref 1.7–2.4)

## 2020-05-31 MED ORDER — ALBUMIN HUMAN 25 % IV SOLN
50.0000 g | Freq: Once | INTRAVENOUS | Status: AC
  Administered 2020-05-31: 50 g via INTRAVENOUS
  Filled 2020-05-31: qty 200

## 2020-05-31 MED ORDER — LIDOCAINE HCL 1 % IJ SOLN
INTRAMUSCULAR | Status: AC
  Filled 2020-05-31: qty 20

## 2020-05-31 NOTE — Progress Notes (Signed)
[facility] Progress Note  Patient: Denise [name]    BPZ:[id number]  DOA: 05/23/2020     Date of Service: the patient was seen and examined on 05/31/2020  Brief hospital course: Past medical history of HTN, PPM implant, hypothyroidism, COPD on chronic respiratory failure with 2 LPM, A. fib on Eliquis, CKD 3A, type II DM, dementia.  Presents with a fall leading to right leg pain found to have distant femur fracture. Currently plan is monitor for improvement in oral intake and stabilization of lab work prior to discharge to SNF.  Assessment and Plan: 1.Right femur fracture SP intramedullary retrograde femoral nailing on 3/11 by Dr. Jena Gauss. Presented to [facility] ED in [place] after she was found down at home and had x-rays concerning for mildly displaced right femoral shaft fracture Orthopedics were consulted. Patient underwent surgery on 3/11. Tolerated procedure very well. Weightbearing for transfer to right lower extremity. Reinforcing dressing as needed. From orthopedic perspective okay to resume Eliquis. Appreciate assistance. PT OT recommends SNF.  2.Distended abdomen with intractable nausea and vomiting. Severe constipation. Patient reports recurrent nausea followed by vomiting. Due to severe distention of the stomach NG tube was inserted. Appears to be doing well after insertion with low intermittent suction. Patient was given enema x1 as well. We will continue with bowel regimen. Continue with Reglan. Continue with simethicone. NG tube removed on 3/15. Tolerating full liquid diet.  Will advance to soft diet. Abd distention appears to have recurred on 3/16  CT abdomen showed severe ascites Patient for paracentesis today with removal of 8 L of ascitic fluid We will give 1 dose of albumin 50 g  3.  Chronic atrial fibrillation Non-ischemic cardiomyopathy Severe tricuspid murmur. Chronic RV failure. Severe pulmonary hypertension EF had normalized on  most recent echo in 2019at which time she had mild LVH, severe LAE, mild AI, mild MR, and moderate TR She appears to have some mild volume overload with pedal edema CHADS-VASc at least 6 (age x2, CHF, HTN, DM, gender) Resume Eliquis. Restart diuretics in a.m.  4. ?Upper GI bleeding Dilutional anemia as well as postoperative acute blood loss anemia. Patient's daughter reported that emesis in ED looked like dark blood Although she has had some vomiting in the hospital, no reported hematemesis or coffee-grounds Require 1 PRBC on 3/11 before surgery, although suspect this is secondary to dilution. After on PRBC transfusion hemoglobin came up to 8.7.   Currently hemoglobin is stable around 8, likely postop anemia.   5.Complete heart block Pacer interrogated in January 2021, had normal device function  6.Type II DM uncontrolled with hyperglycemia.  No long-term insulin use.  Associated with renal dysfunction. Check CBGs and use a low-intensity SSI for now  7.CKD IIIa SCr was 1.00 in ED which appears to be her baseline Renally-dose medications, monitor  8.Hypertension BP at goal, treat as-needed only for now   9. Chronic hypoxic respiratory failure  Appears stable on her usual 2 Lpm   10. Dementia, mild Per family the patient is disoriented at baseline, walks with a walker, and is able to feed, dress, and toilet on her own  28.  Cough. Atelectasis likely. Monitor. Low threshold to initiate antibiotics. Repeat chest x-ray on 3/13 unchanged.  12.  Obesity. Placing the patient at high risk for poor outcome as well as slow recovery. Body mass index is 31.39 kg/m.  Nutrition Problem: Increased nutrient needs Etiology: post-op healing Interventions: Interventions: Ensure Enlive (each supplement provides 350kcal and 20 grams of protein),MVI  Diet: Soft diet DVT Prophylaxis:   SCDs Start: 05/24/20 1244 SCDs Start: 05/23/20 0609 apixaban (ELIQUIS)  tablet 5 mg    Advance goals of care discussion: DNR  Family Communication: family was present at bedside, at the time of interview.  The pt provided permission to discuss medical plan with the family. Opportunity was given to ask question and all questions were answered satisfactorily.   Disposition:  Status is: Inpatient  Remains inpatient appropriate because:IV treatments appropriate due to intensity of illness or inability to take PO  Dispo: The patient is from: Home              Anticipated d/c is to: SNF              Patient currently is not medically stable to d/c.   Difficult to place patient No  Subjective: Patient had some nausea and vomiting overnight.  Abdomen remains distended.  No shortness of breath.  Physical Exam: General exam: Alert, awake, oriented x 3 Respiratory system: Clear to auscultation. Respiratory effort normal. Cardiovascular system:RRR. No murmurs, rubs, gallops. Gastrointestinal system: Abdomen is distended, soft and nontender. No organomegaly or masses felt. Normal bowel sounds heard. Central nervous system: Alert and oriented. No focal neurological deficits. Extremities: 1+ edema bilaterally Skin: No rashes, lesions or ulcers Psychiatry: Judgement and insight appear normal. Mood & affect appropriate.      Vitals:   05/31/20 0332 05/31/20 0840 05/31/20 1440 05/31/20 1942  BP: (!) 122/43 (!) 116/41  (!) 110/58  Pulse: 70 71 72 70  Resp: 17 17 18 17   Temp: 98.3 F (36.8 C) 98.2 F (36.8 C) 98.8 F (37.1 C) 98 F (36.7 C)  TempSrc:  Oral Oral Oral  SpO2: 96% 95% 94% 95%  Weight:      Height:       No intake or output data in the 24 hours ending 05/31/20 2006 Filed Weights   05/26/20 0500 05/26/20 1844  Weight: 72.9 kg 72.9 kg    Data Reviewed: I have personally reviewed and interpreted daily labs, tele strips, imaging. I reviewed all nursing notes, pharmacy notes, vitals, pertinent old records I have discussed plan of care as  described above with RN and patient/family.  CBC: Recent Labs  Lab 05/25/20 0308 05/26/20 0136 05/27/20 0337 05/28/20 0319 05/30/20 0219  WBC 9.5 11.6* 6.8 10.7* 10.1  HGB 7.9* 8.0* 15.6* 8.1* 8.6*  HCT 24.4* 23.9* 47.4* 25.5* 26.0*  MCV 87.8 87.2 87.9 89.5 89.0  PLT 286 308 206 360 393   Basic Metabolic Panel: Recent Labs  Lab 05/25/20 0308 05/26/20 0136 05/27/20 0337 05/28/20 0319 05/29/20 0337 05/30/20 0219 05/31/20 0358  NA 134* 129* 132* 131*  --  129*  --   K 3.9 3.4* 4.8 4.4  --  4.0  --   CL 97* 95* 99 100  --  97*  --   CO2 29 27 24 22   --  25  --   GLUCOSE 114* 113* 105* 119*  --  119*  --   BUN 39* 38* 42* 47*  --  46*  --   CREATININE 1.41* 1.42* 1.30* 1.33*  --  1.25*  --   CALCIUM 8.2* 7.9* 8.1* 7.9*  --  8.0*  --   MG 1.6* 2.0 2.0 1.9 2.0 1.9 2.0    Studies: IR Paracentesis  Result Date: 05/31/2020 INDICATION: Patient with history hepatic steatosis, abdominal distension, and ascites. Request is made for therapeutic paracentesis. EXAM: ULTRASOUND GUIDED THERAPEUTIC PARACENTESIS MEDICATIONS:  10 mL 1% lidocaine COMPLICATIONS: None immediate. PROCEDURE: Informed written consent was obtained from the patient after a discussion of the risks, benefits and alternatives to treatment. A timeout was performed prior to the initiation of the procedure. Initial ultrasound scanning demonstrates a large amount of ascites within the left lower abdominal quadrant. The left lower abdomen was prepped and draped in the usual sterile fashion. 1% lidocaine was used for local anesthesia. Following this, a 19 gauge, 7-cm, Yueh catheter was introduced. An ultrasound image was saved for documentation purposes. The paracentesis was performed. The catheter was removed and a dressing was applied. The patient tolerated the procedure well without immediate post procedural complication. FINDINGS: A total of approximately 8 L of clear gold fluid was removed. IMPRESSION: Successful  ultrasound-guided paracentesis yielding 8 L of peritoneal fluid. Read by: Elwin Mocha, PA-C Electronically Signed   By: Gilmer Mor D.O.   On: 05/31/2020 17:05    Scheduled Meds: . apixaban  5 mg Oral BID  . Chlorhexidine Gluconate Cloth  6 each Topical Q0600  . feeding supplement  237 mL Oral TID BM  . lactulose  20 g Oral BID  . lidocaine      . senna-docusate  2 tablet Oral BID  . simethicone  80 mg Oral QID   Continuous Infusions: . albumin human     PRN Meds: acetaminophen **OR** acetaminophen, labetalol, morphine injection, ondansetron **OR** ondansetron (ZOFRAN) IV, oxyCODONE  Time spent: 35 minutes  Author: Erick Blinks, MD [facility] 05/31/2020 8:06 PM  To reach On-call, see care teams to locate the attending and reach out via [email or web address]. Between 7PM-7AM, please contact night-coverage If you still have difficulty reaching the attending Lyllian Gause, please page the [facility] (Director on Call) for [facility] on amion for assistance.

## 2020-05-31 NOTE — Care Management Important Message (Signed)
Important Message  Patient Details  Name: JENAVI BEEDLE MRN: [id number] Date of Birth: 1923-06-30   Medicare Important Message Given:  Yes - Important Message mailed due to current National Emergency  Verbal consent obtained due to current National Emergency  Relationship to patient: Child Contact Name: Fayrene Fearing Call Date: 05/31/20  Time: 1003 Phone: [phone] Outcome: Spoke with contact Important Message mailed to: Patient address on file     Oralia Rud Wrenly Lauritsen 05/31/2020, 10:05 AM

## 2020-05-31 NOTE — Procedures (Signed)
PROCEDURE SUMMARY:  Successful image-guided paracentesis from the left lateral abdomen.  Yielded 8.0 liters of clear gold fluid.  No immediate complications.  EBL = 0 mL. Patient tolerated well.   Specimen was not sent for labs.  Please see imaging section of Epic for full dictation.   Gordy Councilman Lanah Steines PA-C 05/31/2020 4:25 PM

## 2020-05-31 NOTE — Progress Notes (Signed)
Physical Therapy Treatment Patient Details Name: Denise [name] MRN: [id number] DOB: 22-Sep-1923 Today's Date: 05/31/2020    History of Present Illness Pt is 87 yo female admitted s/p fall with resultant right femoral  shaft fracture.  Underwent IM Nail 05/24/20.   W/u also revealed potential GI Bleed.  PMH significant for hypertension, nonischemic cardiomyopathy, heart block with pacer, hypothyroidism, chronic 2 L/min supplemental oxygen requirement, atrial fibrillation on Eliquis, CKD 3A, type 2 diabetes mellitus, and suspected dementia    PT Comments    Pt supine in bed on arrival.  Pt following commands intermittently to participate on LE exercises and transfer training.  She remains to present with poor balance and anterior translation into standing.  Continue to recommend SNF.  Returned back to bed as she is awaiting a procedure today.      Follow Up Recommendations  SNF     Equipment Recommendations  None recommended by PT    Recommendations for Other Services       Precautions / Restrictions Precautions Precautions: Fall Restrictions Weight Bearing Restrictions: Yes RLE Weight Bearing: Weight bearing as tolerated (for transfers only)    Mobility  Bed Mobility Overal bed mobility: Needs Assistance Bed Mobility: Supine to Sit;Sit to Supine     Supine to sit: Max assist Sit to supine: Total assist;+2 for physical assistance   General bed mobility comments: Max assistance to advance into sitting edge of bed.  Able to sit edge of bed x 4-5 min.  Pt with ability to maintain balance as sitting trial progressed and feet were grounded on the floor.    Transfers Overall transfer level: Needs assistance Equipment used:  (back of recliner for grab handles.) Transfers: Sit to/from Stand Sit to Stand: Max assist;+2 physical assistance         General transfer comment: Pt perofmed partial sit to stand with posterior bias.  leaning backward holding to handles on back of  recliner.  Performed x 3 reps.  Unable to translater anterior due to fear and bracing legs against back of bed.  Ambulation/Gait Ambulation/Gait assistance:  (WBAT for transfers only.)               Stairs             Wheelchair Mobility    Modified Rankin (Stroke Patients Only)       Balance     Sitting balance-Leahy Scale: Fair (intially poor but progressed to fair.)       Standing balance-Leahy Scale: Poor Standing balance comment: unable to achieve or maintain standing without total assist                            Cognition Arousal/Alertness: Awake/alert Behavior During Therapy: Anxious Overall Cognitive Status: History of cognitive impairments - at baseline                                 General Comments: pt with delayed response time but does follow simple commands but is self limiting due to fear of onset of pain and falling, pt with decreased insight to situation and safety      Exercises Total Joint Exercises Ankle Circles/Pumps: AROM;Both;10 reps;Supine;AAROM Heel Slides: AROM;AAROM;Both;10 reps;Supine Hip ABduction/ADduction: AROM;AAROM;Both;10 reps;Supine    General Comments        Pertinent Vitals/Pain Pain Assessment: Faces Faces Pain Scale: Hurts even more Pain Location: RLE during transfer  into standing. Pain Descriptors / Indicators: Discomfort;Grimacing;Guarding Pain Intervention(s): Monitored during session;Repositioned    Home Living                      Prior Function            PT Goals (current goals can now be found in the care plan section) Acute Rehab PT Goals Patient Stated Goal: get her strong enough to come home Potential to Achieve Goals: Fair Progress towards PT goals: Progressing toward goals    Frequency    Min 3X/week      PT Plan Current plan remains appropriate    Co-evaluation              AM-PAC PT "6 Clicks" Mobility   Outcome Measure  Help  needed turning from your back to your side while in a flat bed without using bedrails?: A Lot Help needed moving from lying on your back to sitting on the side of a flat bed without using bedrails?: A Lot Help needed moving to and from a bed to a chair (including a wheelchair)?: A Lot Help needed standing up from a chair using your arms (e.g., wheelchair or bedside chair)?: A Lot Help needed to walk in hospital room?: Total Help needed climbing 3-5 steps with a railing? : Total 6 Click Score: 10    End of Session Equipment Utilized During Treatment: Gait belt Activity Tolerance: Patient limited by pain Patient left: in bed;with bed alarm set;with family/visitor present;with call bell/phone within reach (left in bed as she was awaiting procedure in IR.) Nurse Communication: Mobility status PT Visit Diagnosis: Repeated falls (R29.6);History of falling (Z91.81);Other abnormalities of gait and mobility (R26.89);Muscle weakness (generalized) (M62.81);Pain Pain - Right/Left: Right Pain - part of body: Leg     Time: 1215-1238 PT Time Calculation (min) (ACUTE ONLY): 23 min  Charges:  $Therapeutic Exercise: 8-22 mins $Therapeutic Activity: 8-22 mins                     Bonney Leitz , PTA Acute Rehabilitation Services Pager [phone] Office [phone]     Jacque Garrels Artis Delay 05/31/2020, 12:58 PM

## 2020-05-31 NOTE — Discharge Instructions (Signed)

## 2020-05-31 NOTE — TOC Progression Note (Addendum)
Transition of Care [facility]) - Progression Note    Patient Details  Name: Denise [name] MRN: [id number] Date of Birth: 25-Apr-1923  [facility] CM/SW Contact  Carley Hammed, [place] Phone Number: 05/31/2020, 2:34 PM  Clinical Narrative:    Pt and family chose [facility] in [place]. Pt is getting a procedure today and transportation conflicts can be resolved if DC tomorrow. CSW was alerted that [facility] cannot transport on Saturday, and neither can the transport that the facility would set up. Dc would need to be Sunday if by ambulance. Pt's son stated he would transport her by car if medically appropriate. MD advised that if pt responds to the procedure well, she can likely transport with son tomorrow, if that is not appropriate, CSW has setup for [facility] to transport on 3/20 between 8 and 9. Pt and facility updated.This will need to be canceled if pt transports on Saturday! Facility will need the DC summary before 4 the day of DC. The nurse will call report to [phone] and ask for the 400 hall nurse, Debbie. SW will need to fax DC summary to [phone] when DC summary is put in.    Expected Discharge Plan: Skilled Nursing Facility Barriers to Discharge: Continued Medical Work up  Expected Discharge Plan and Services Expected Discharge Plan: Skilled Nursing Facility     Post Acute Care Choice: Skilled Nursing Facility Living arrangements for the past 2 months: Single Family Home                                       Social Determinants of Health (SDOH) Interventions    Readmission Risk Interventions No flowsheet data found.

## 2020-06-01 DIAGNOSIS — S72341A Displaced spiral fracture of shaft of right femur, initial encounter for closed fracture: Secondary | ICD-10-CM | POA: Diagnosis not present

## 2020-06-01 DIAGNOSIS — J9611 Chronic respiratory failure with hypoxia: Secondary | ICD-10-CM | POA: Diagnosis not present

## 2020-06-01 DIAGNOSIS — I4891 Unspecified atrial fibrillation: Secondary | ICD-10-CM | POA: Diagnosis not present

## 2020-06-01 DIAGNOSIS — N1831 Chronic kidney disease, stage 3a: Secondary | ICD-10-CM | POA: Diagnosis not present

## 2020-06-01 LAB — ALBUMIN: Albumin: 1.5 g/dL — ABNORMAL LOW (ref 3.5–5.0)

## 2020-06-01 LAB — BASIC METABOLIC PANEL
Anion gap: 10 (ref 5–15)
BUN: 60 mg/dL — ABNORMAL HIGH (ref 8–23)
CO2: 21 mmol/L — ABNORMAL LOW (ref 22–32)
Calcium: 7.8 mg/dL — ABNORMAL LOW (ref 8.9–10.3)
Chloride: 95 mmol/L — ABNORMAL LOW (ref 98–111)
Creatinine, Ser: 1.39 mg/dL — ABNORMAL HIGH (ref 0.44–1.00)
GFR, Estimated: 35 mL/min — ABNORMAL LOW (ref >60)
Glucose, Bld: 149 mg/dL — ABNORMAL HIGH (ref 70–99)
Potassium: 4.4 mmol/L (ref 3.5–5.1)
Sodium: 126 mmol/L — ABNORMAL LOW (ref 135–145)

## 2020-06-01 LAB — CBC
HCT: 27.2 % — ABNORMAL LOW (ref 36.0–46.0)
Hemoglobin: 9.2 g/dL — ABNORMAL LOW (ref 12.0–15.0)
MCH: 30 pg (ref 26.0–34.0)
MCHC: 33.8 g/dL (ref 30.0–36.0)
MCV: 88.6 fL (ref 80.0–100.0)
Platelets: 423 10*3/uL — ABNORMAL HIGH (ref 150–400)
RBC: 3.07 MIL/uL — ABNORMAL LOW (ref 3.87–5.11)
RDW: 21.8 % — ABNORMAL HIGH (ref 11.5–15.5)
WBC: 10.9 10*3/uL — ABNORMAL HIGH (ref 4.0–10.5)
nRBC: 0 % (ref 0.0–0.2)

## 2020-06-01 MED ORDER — POLYETHYLENE GLYCOL 3350 17 G PO PACK
17.0000 g | PACK | Freq: Every day | ORAL | Status: DC
  Administered 2020-06-01 – 2020-06-03 (×2): 17 g via ORAL
  Filled 2020-06-01 (×2): qty 1

## 2020-06-01 MED ORDER — MORPHINE SULFATE (PF) 2 MG/ML IV SOLN: 2.0000 mg | INTRAVENOUS | Status: DC | PRN

## 2020-06-01 MED ORDER — SODIUM CHLORIDE 0.9 % IV SOLN: INTRAVENOUS | Status: DC

## 2020-06-01 MED ORDER — OXYCODONE HCL 5 MG PO TABS: 5.0000 mg | ORAL_TABLET | Freq: Four times a day (QID) | ORAL | Status: DC | PRN

## 2020-06-01 MED ORDER — ALBUMIN HUMAN 25 % IV SOLN
25.0000 g | Freq: Four times a day (QID) | INTRAVENOUS | Status: AC
  Administered 2020-06-01 – 2020-06-02 (×4): 25 g via INTRAVENOUS
  Filled 2020-06-01 (×5): qty 100

## 2020-06-01 NOTE — Progress Notes (Signed)
[facility] Progress Note  Patient: Denise [name]    FIE:[id number]  DOA: 05/23/2020     Date of Service: the patient was seen and examined on 06/01/2020  Brief hospital course: Past medical history of HTN, PPM implant, hypothyroidism, COPD on chronic respiratory failure with 2 LPM, A. fib on Eliquis, CKD 3A, type II DM, dementia.  Presents with a fall leading to right leg pain found to have distant femur fracture. Currently plan is monitor for improvement in oral intake and stabilization of lab work prior to discharge to SNF.  Assessment and Plan: 1.Right femur fracture SP intramedullary retrograde femoral nailing on 3/11 by Dr. Jena Gauss. Presented to [facility] ED in [place] after she was found down at home and had x-rays concerning for mildly displaced right femoral shaft fracture Orthopedics were consulted. Patient underwent surgery on 3/11. Tolerated procedure very well. Weightbearing for transfer to right lower extremity. Reinforcing dressing as needed. From orthopedic perspective okay to resume Eliquis. Appreciate assistance. PT OT recommends SNF.  2.Distended abdomen with intractable nausea and vomiting. Severe constipation. Patient reports recurrent nausea followed by vomiting. Due to severe distention of the stomach NG tube was inserted. Appears to be doing well after insertion with low intermittent suction. Patient was given enema x1 as well. We will continue with bowel regimen. Continue with Reglan. Continue with simethicone. NG tube removed on 3/15. Tolerating full liquid diet.  Will advance to soft diet. Abd distention appears to have recurred on 3/16  CT abdomen showed severe ascites Patient for paracentesis on 3/18 with removal of 8 L of ascitic fluid She has been started on albumin infusions  3.  Chronic atrial fibrillation Non-ischemic cardiomyopathy Severe tricuspid murmur. Chronic RV failure. Severe pulmonary hypertension EF had  normalized on most recent echo in 2019at which time she had mild LVH, severe LAE, mild AI, mild MR, and moderate TR She appears to have some mild volume overload with pedal edema CHADS-VASc at least 6 (age x2, CHF, HTN, DM, gender) Resume Eliquis. Consider restarting diuretics once volume status has stabilized  4. ?Upper GI bleeding Dilutional anemia as well as postoperative acute blood loss anemia. Patient's daughter reported that emesis in ED looked like dark blood Although she has had some vomiting in the hospital, no reported hematemesis or coffee-grounds Require 1 PRBC on 3/11 before surgery, although suspect this is secondary to dilution. After on PRBC transfusion hemoglobin came up to 8.7.   Currently hemoglobin is stable around 8, likely postop anemia.   5.Complete heart block Pacer interrogated in January 2021, had normal device function  6.Type II DM uncontrolled with hyperglycemia.  No long-term insulin use.  Associated with renal dysfunction. Check CBGs and use a low-intensity SSI for now  7.AKI on CKD IIIa Increase in creatinine and BUN since paracentesis Patient is likely intravascularly volume depleted with low albumin Will start on gentle hydration with albumin infusion  8.Hypertension BP at goal, treat as-needed only for now   9. Chronic hypoxic respiratory failure  Appears stable on her usual 2 Lpm   10. Dementia, mild Per family the patient is disoriented at baseline, walks with a walker, and is able to feed, dress, and toilet on her own  30.  Cough. Atelectasis likely. Monitor. Low threshold to initiate antibiotics. Repeat chest x-ray on 3/13 unchanged.  12.  Obesity. Placing the patient at high risk for poor outcome as well as slow recovery. Body mass index is 31.39 kg/m.  Nutrition Problem: Increased nutrient needs Etiology: post-op healing  Interventions: Interventions: Ensure Enlive (each supplement provides 350kcal and  20 grams of protein),MVI  13. Hyponatremia Patient is likely hypovolemic and intravascularly volume depleted Started on gentle saline infusion with albumin infusions     Diet: Soft diet DVT Prophylaxis:   SCDs Start: 05/24/20 1244 SCDs Start: 05/23/20 0609 apixaban (ELIQUIS) tablet 5 mg    Advance goals of care discussion: DNR  Family Communication: family was present at bedside, at the time of interview.  The pt provided permission to discuss medical plan with the family. Opportunity was given to ask question and all questions were answered satisfactorily.   Disposition:  Status is: Inpatient  Remains inpatient appropriate because:IV treatments appropriate due to intensity of illness or inability to take PO  Dispo: The patient is from: Home              Anticipated d/c is to: SNF              Patient currently is not medically stable to d/c.   Difficult to place patient No  Subjective: Family feels that patient is more somnolent since paracentesis yesterday. PO intake has been poor today  Physical Exam: General exam: somnolent, but wakes up to voice Respiratory system: Clear to auscultation. Respiratory effort normal. Cardiovascular system:RRR. No murmurs, rubs, gallops. Gastrointestinal system: Abdomen is less distended, soft and nontender. No organomegaly or masses felt. Normal bowel sounds heard. Central nervous system: Alert and oriented. No focal neurological deficits. Extremities: No C/C/E, +pedal pulses Skin: No rashes, lesions or ulcers Psychiatry: Judgement and insight appear normal. Mood & affect appropriate.       Vitals:   05/31/20 1942 06/01/20 0413 06/01/20 0734 06/01/20 1442  BP: (!) 110/58 (!) 112/43 (!) 117/49 (!) 123/32  Pulse: 70 72 70 70  Resp: 17 16 17 17   Temp: 98 F (36.7 C) 98.3 F (36.8 C) (!) 97.3 F (36.3 C) 98.1 F (36.7 C)  TempSrc: Oral  Oral Oral  SpO2: 95% 96% 95% 95%  Weight:      Height:        Intake/Output Summary (Last  24 hours) at 06/01/2020 1505 Last data filed at 06/01/2020 0500 Gross per 24 hour  Intake -  Output 159 ml  Net -159 ml   Filed Weights   05/26/20 0500 05/26/20 1844  Weight: 72.9 kg 72.9 kg    Data Reviewed: I have personally reviewed and interpreted daily labs, tele strips, imaging. I reviewed all nursing notes, pharmacy notes, vitals, pertinent old records I have discussed plan of care as described above with RN and patient/family.  CBC: Recent Labs  Lab 05/26/20 0136 05/27/20 0337 05/28/20 0319 05/30/20 0219 06/01/20 0017  WBC 11.6* 6.8 10.7* 10.1 10.9*  HGB 8.0* 15.6* 8.1* 8.6* 9.2*  HCT 23.9* 47.4* 25.5* 26.0* 27.2*  MCV 87.2 87.9 89.5 89.0 88.6  PLT 308 206 360 393 423*   Basic Metabolic Panel: Recent Labs  Lab 05/26/20 0136 05/27/20 0337 05/28/20 0319 05/29/20 0337 05/30/20 0219 05/31/20 0358 06/01/20 0017  NA 129* 132* 131*  --  129*  --  126*  K 3.4* 4.8 4.4  --  4.0  --  4.4  CL 95* 99 100  --  97*  --  95*  CO2 27 24 22   --  25  --  21*  GLUCOSE 113* 105* 119*  --  119*  --  149*  BUN 38* 42* 47*  --  46*  --  60*  CREATININE 1.42* 1.30* 1.33*  --  1.25*  --  1.39*  CALCIUM 7.9* 8.1* 7.9*  --  8.0*  --  7.8*  MG 2.0 2.0 1.9 2.0 1.9 2.0  --     Studies: IR Paracentesis  Result Date: 05/31/2020 INDICATION: Patient with history hepatic steatosis, abdominal distension, and ascites. Request is made for therapeutic paracentesis. EXAM: ULTRASOUND GUIDED THERAPEUTIC PARACENTESIS MEDICATIONS: 10 mL 1% lidocaine COMPLICATIONS: None immediate. PROCEDURE: Informed written consent was obtained from the patient after a discussion of the risks, benefits and alternatives to treatment. A timeout was performed prior to the initiation of the procedure. Initial ultrasound scanning demonstrates a large amount of ascites within the left lower abdominal quadrant. The left lower abdomen was prepped and draped in the usual sterile fashion. 1% lidocaine was used for local  anesthesia. Following this, a 19 gauge, 7-cm, Yueh catheter was introduced. An ultrasound image was saved for documentation purposes. The paracentesis was performed. The catheter was removed and a dressing was applied. The patient tolerated the procedure well without immediate post procedural complication. FINDINGS: A total of approximately 8 L of clear gold fluid was removed. IMPRESSION: Successful ultrasound-guided paracentesis yielding 8 L of peritoneal fluid. Read by: Elwin Mocha, PA-C Electronically Signed   By: Gilmer Mor D.O.   On: 05/31/2020 17:05    Scheduled Meds: . apixaban  5 mg Oral BID  . Chlorhexidine Gluconate Cloth  6 each Topical Q0600  . feeding supplement  237 mL Oral TID BM  . lactulose  20 g Oral BID  . polyethylene glycol  17 g Oral Daily  . senna-docusate  2 tablet Oral BID  . simethicone  80 mg Oral QID   Continuous Infusions: . sodium chloride 50 mL/hr at 06/01/20 1123  . albumin human 25 g (06/01/20 1144)   PRN Meds: acetaminophen **OR** acetaminophen, labetalol, morphine injection, ondansetron **OR** ondansetron (ZOFRAN) IV, oxyCODONE  Time spent: 35 minutes  Author: Erick Blinks, MD [facility] 06/01/2020 3:05 PM  To reach On-call, see care teams to locate the attending and reach out via [email or web address]. Between 7PM-7AM, please contact night-coverage If you still have difficulty reaching the attending provider, please page the [employer] (Director on Call) for [facility] on amion for assistance.

## 2020-06-02 DIAGNOSIS — S72341A Displaced spiral fracture of shaft of right femur, initial encounter for closed fracture: Secondary | ICD-10-CM | POA: Diagnosis not present

## 2020-06-02 DIAGNOSIS — I4891 Unspecified atrial fibrillation: Secondary | ICD-10-CM | POA: Diagnosis not present

## 2020-06-02 DIAGNOSIS — J9611 Chronic respiratory failure with hypoxia: Secondary | ICD-10-CM | POA: Diagnosis not present

## 2020-06-02 DIAGNOSIS — N1831 Chronic kidney disease, stage 3a: Secondary | ICD-10-CM | POA: Diagnosis not present

## 2020-06-02 LAB — CBC
HCT: 26.1 % — ABNORMAL LOW (ref 36.0–46.0)
Hemoglobin: 8.7 g/dL — ABNORMAL LOW (ref 12.0–15.0)
MCH: 29.8 pg (ref 26.0–34.0)
MCHC: 33.3 g/dL (ref 30.0–36.0)
MCV: 89.4 fL (ref 80.0–100.0)
Platelets: 434 10*3/uL — ABNORMAL HIGH (ref 150–400)
RBC: 2.92 MIL/uL — ABNORMAL LOW (ref 3.87–5.11)
RDW: 21.8 % — ABNORMAL HIGH (ref 11.5–15.5)
WBC: 12.3 10*3/uL — ABNORMAL HIGH (ref 4.0–10.5)
nRBC: 0 % (ref 0.0–0.2)

## 2020-06-02 LAB — RENAL FUNCTION PANEL
Albumin: 2.3 g/dL — ABNORMAL LOW (ref 3.5–5.0)
Anion gap: 9 (ref 5–15)
BUN: 61 mg/dL — ABNORMAL HIGH (ref 8–23)
CO2: 22 mmol/L (ref 22–32)
Calcium: 8 mg/dL — ABNORMAL LOW (ref 8.9–10.3)
Chloride: 99 mmol/L (ref 98–111)
Creatinine, Ser: 1.23 mg/dL — ABNORMAL HIGH (ref 0.44–1.00)
GFR, Estimated: 41 mL/min — ABNORMAL LOW (ref >60)
Glucose, Bld: 136 mg/dL — ABNORMAL HIGH (ref 70–99)
Phosphorus: 3 mg/dL (ref 2.5–4.6)
Potassium: 3.4 mmol/L — ABNORMAL LOW (ref 3.5–5.1)
Sodium: 130 mmol/L — ABNORMAL LOW (ref 135–145)

## 2020-06-02 MED ORDER — FUROSEMIDE 40 MG PO TABS
40.0000 mg | ORAL_TABLET | Freq: Every day | ORAL | Status: DC
  Administered 2020-06-02 – 2020-06-03 (×2): 40 mg via ORAL
  Filled 2020-06-02 (×2): qty 1

## 2020-06-02 MED ORDER — ACETAMINOPHEN 325 MG PO TABS: 650.0000 mg | ORAL_TABLET | Freq: Four times a day (QID) | ORAL | Status: DC | PRN

## 2020-06-02 MED ORDER — POLYETHYLENE GLYCOL 3350 17 G PO PACK: 17.0000 g | PACK | Freq: Every day | ORAL | 0 refills | Status: DC

## 2020-06-02 MED ORDER — POTASSIUM CHLORIDE CRYS ER 20 MEQ PO TBCR
40.0000 meq | EXTENDED_RELEASE_TABLET | ORAL | Status: AC
  Administered 2020-06-02 (×2): 40 meq via ORAL
  Filled 2020-06-02: qty 2

## 2020-06-02 MED ORDER — FUROSEMIDE 40 MG PO TABS: 40.0000 mg | ORAL_TABLET | ORAL | Status: DC

## 2020-06-02 NOTE — Discharge Summary (Addendum)
Physician Discharge Summary  Denise [name] ZOX:[id number] DOB: [birth year] DOA: 05/23/2020  PCP: Daryl Eastern, MD  Admit date: 05/23/2020 Discharge date: 06/03/2020  Admitted From: home Disposition:  SNF  Recommendations for Outpatient Follow-up:  1. Follow up with PCP in 1-2 weeks 2. Please obtain BMP/CBC in one week 3. Follow up with ortho, Dr. Jena Gauss in 2 weeks 4. Follow up with Dr. Tresa Endo, cardiology in 2 weeks 5. If patient fails to improve/has a steady decline in the next 1-2 weeks, family will want to pursue hospice/comfort measures  Discharge Condition:stable CODE STATUS:DNR Diet recommendation: heart heatlhy  Brief/Interim Summary: Past medical history of HTN, PPM implant, hypothyroidism, COPD on chronic respiratory failure with 2 LPM, A. fib on Eliquis, CKD 3A, type II DM, dementia. Presents with a fall leading to right leg pain found to have distant femur fracture. Currently plan is monitor for improvement in oral intake and stabilization of lab work prior to discharge to SNF.  Discharge Diagnoses:  Principal Problem:   Closed fracture of right femur (HCC) Active Problems:   Atrial fibrillation (HCC)   HTN (hypertension)   Nonischemic cardiomyopathy (HCC)   Thyroid disease   Chronic kidney disease, stage 3a (HCC)   Chronic respiratory failure with hypoxia (HCC)   Anemia due to chronic kidney disease   Femur fracture, right (HCC)  1.Right femur fracture SP intramedullary retrograde femoral nailing on 3/11 by Dr. Jena Gauss. Presented [facility] ED in [place] after she was found down at home and had x-rays concerning for mildly displaced right femoral shaft fracture Orthopedics were consulted. Patient underwent surgery on 3/11. Tolerated procedure very well. Weightbearing for transfer to right lower extremity. Reinforcing dressing as needed. From orthopedic perspective okay to resume Eliquis. Appreciate assistance. PT OT recommends  SNF.  2.Distended abdomen with intractable nausea and vomiting. Severe constipation. Patient reports recurrent nausea followed by vomiting. Due to severe distention of the stomach NG tube was inserted. Appears to be doing well after insertion with low intermittent suction. Patient was given enema x1 as well. We will continue with bowel regimen. Continue with Reglan. Continue with simethicone. NG tube removed on 3/15. Tolerating full liquid diet.  Will advance to soft diet. Abd distention appears to have recurred on 3/16  CT abdomen showed severe ascites Patient for paracentesis on 3/18 with removal of 8 L of ascitic fluid She has been started on albumin infusions Overall abdominal distention has improved  3.Chronic atrial fibrillation Non-ischemic cardiomyopathy Severe tricuspid murmur. Chronic RV failure. Severe pulmonary hypertension EF had normalized on most recent echo in 2019at which time she had mild LVH, severe LAE, mild AI, mild MR, and moderate TR She appears to have some mild volume overload with pedal edema CHADS-VASc at least 6 (age x2, CHF, HTN, DM, gender) ResumeEliquis. Patient will be restarted on lasix Holding imdur, metoprolol, ramipril and hydralazine due to soft blood pressures  4. ?Upper GI bleeding Dilutional anemia as well as postoperative acute blood loss anemia. Patient's daughter reported that emesis in ED looked like dark blood Although she has had some vomiting in the hospital, no reported hematemesis or coffee-grounds Require 1 PRBC on 3/11 before surgery, although suspect this is secondary to dilution. After on PRBC transfusion hemoglobin came up to 8.7.  Currently hemoglobin is stable around 8, likely postop anemia.   5.Complete heart block Pacer interrogated in January 2021, had normal device function  6.Type II DMuncontrolled with hyperglycemia. No long-term insulin use. Associated with renal dysfunction. Check  CBGs and  use a low-intensity SSI for now Blood sugars have been stable  7.AKI on CKD IIIa Increase in creatinine and BUN since paracentesis Patient is likely intravascularly volume depleted with low albumin Treated with gentle hydration with albumin infusion Renal function currently stable  8.Hypertension BP at goal, treat as-needed only for now   9. Chronic hypoxic respiratory failure  Appears stable on her usual 2 Lpm   10. Dementia, mild Per family the patient is disoriented at baseline, walks with a walker, and is able to feed, dress, and toilet on her own  68. Cough. Atelectasis likely. Monitor. Low threshold to initiate antibiotics. Repeat chest x-ray on 3/13 unchanged.  12.  Obesity. Placing the patient at high risk for poor outcome as well as slow recovery. Body mass index is 31.39 kg/m.  Nutrition Problem: Increased nutrient needs Etiology: post-op healing Interventions: Interventions: Ensure Enlive (each supplement provides 350kcal and 20 grams of protein),MVI  13. Hyponatremia Patient is likely hypovolemic and intravascularly volume depleted Started on gentle saline infusion with albumin infusions Sodium has improved  14. LUE swelling Unlikely DVT since she is already anticoagulated No signs of cellulitis I suspect this is related to infiltration of IV while receiving IV fluids and low protein state Will keep upper extremity elevated Started on lasix  15. Goals of care Long talk with paitent's son with whom she has been living with. We discussed her multiple medical problems and frailty. I advised him of the high 6 month mortality rate in people her age who suffer hip fractures. He acknowledged the risk of decline and says he plans to reach out to hospice if that were to occur. He will monitor her over the next few weeks and if she is not showing improvement, then he would wish to take her home with hospice services for end of life  care  Discharge Instructions  Discharge Instructions    Diet - low sodium heart healthy   Complete by: As directed    Discharge wound care:   Complete by: As directed    Reinforce dressing   Increase activity slowly   Complete by: As directed      Allergies as of 06/02/2020      Reactions   Iohexol Rash, Other (See Comments)    Desc: VOMITING-VERIFIED ON 05-24-04 PRE-PROCEDURE/ ARS   Shrimp [shellfish Allergy] Nausea And Vomiting, Rash      Medication List    STOP taking these medications   amLODipine 2.5 MG tablet Commonly known as: NORVASC   hydrochlorothiazide 25 MG tablet Commonly known as: HYDRODIURIL   isosorbide mononitrate 30 MG 24 hr tablet Commonly known as: IMDUR   metoprolol succinate 50 MG 24 hr tablet Commonly known as: TOPROL-XL   ramipril 5 MG capsule Commonly known as: ALTACE     TAKE these medications   acetaminophen 325 MG tablet Commonly known as: TYLENOL Take 2 tablets (650 mg total) by mouth every 6 (six) hours as needed for mild pain, fever or headache.   atorvastatin 10 MG tablet Commonly known as: LIPITOR TAKE ONE TABLET BY MOUTH EVERY EVENING What changed:   how much to take  how to take this  when to take this  additional instructions   Eliquis 5 MG Tabs tablet Generic drug: apixaban Take 5 mg by mouth 2 (two) times daily.   furosemide 40 MG tablet Commonly known as: LASIX Take 1-2 tablets (40-80 mg total) by mouth See admin instructions. Take 1 tablet (40mg ) by mouth daily except on  Tuesday and Friday take 1 tablet (40 mg totally) 2 times daily   methimazole 10 MG tablet Commonly known as: TAPAZOLE TAKE 1/2 TABLET BY MOUTH ONCE DAILY   polyethylene glycol 17 g packet Commonly known as: MIRALAX / GLYCOLAX Take 17 g by mouth daily.   potassium chloride SA 20 MEQ tablet Commonly known as: KLOR-CON Take 40 mEq daily except on Tuesday and Friday take 60 mEg (3 tablets) What changed:   how much to take  how to take  this  when to take this  additional instructions            Discharge Care Instructions  (From admission, onward)         Start     Ordered   06/02/20 0000  Discharge wound care:       Comments: Reinforce dressing   06/02/20 0856          Follow-up Information    [facility] Follow up.   Why: SNF/Rehabiliation Contact information: Address: [address] [place], [place] [address] Phone: [phone]             Allergies  Allergen Reactions  . Iohexol Rash and Other (See Comments)     Desc: VOMITING-VERIFIED ON 05-24-04 PRE-PROCEDURE/ ARS   . Shrimp [Shellfish Allergy] Nausea And Vomiting and Rash    Consultations:  Ortho, Dr. Jena Gauss   Procedures/Studies: CT ABDOMEN PELVIS WO CONTRAST  Result Date: 05/30/2020 CLINICAL DATA:  Abdominal distension. EXAM: CT ABDOMEN AND PELVIS WITHOUT CONTRAST TECHNIQUE: Multidetector CT imaging of the abdomen and pelvis was performed following the standard protocol without IV contrast. COMPARISON:  None. FINDINGS: Lower chest: Mild bilateral posterior basilar subsegmental atelectasis is noted. Hepatobiliary: Status post cholecystectomy. No biliary dilatation is noted. Hepatic steatosis is noted. Pancreas: Unremarkable. No pancreatic ductal dilatation or surrounding inflammatory changes. Spleen: Normal in size without focal abnormality. Adrenals/Urinary Tract: Adrenal glands are unremarkable. Kidneys are normal, without renal calculi, focal lesion, or hydronephrosis. Urinary bladder is decompressed secondary to Foley catheter. Stomach/Bowel: Mild gastric distention is noted. Mild dilated small bowel loops are noted which most likely represent ileus. The appendix is not clearly visualized. Sigmoid diverticulosis is noted without inflammation. Vascular/Lymphatic: Aortic atherosclerosis. No enlarged abdominal or pelvic lymph nodes. Reproductive: Status post hysterectomy. No adnexal masses. Other: Severe ascites is noted.  No hernia  is noted. Musculoskeletal: No acute or significant osseous findings. IMPRESSION: 1. Severe ascites is noted. 2. Hepatic steatosis. 3. Sigmoid diverticulosis without inflammation. 4. Mild gastric distention is noted. Mild dilated small bowel loops are noted which most likely represent ileus. 5. Aortic atherosclerosis. Aortic Atherosclerosis (ICD10-I70.0). Electronically Signed   By: Lupita Raider M.D.   On: 05/30/2020 20:02   DG Abd 1 View  Result Date: 05/26/2020 CLINICAL DATA:  Nasogastric tube placement EXAM: ABDOMEN - 1 VIEW COMPARISON:  May 26, 2020 study obtained earlier in the day FINDINGS: Nasogastric tube tip and side port in stomach. There remains moderate air throughout the stomach. No small or large bowel dilatation evident. No evident free air. Pacemaker leads attached to right atrium and right ventricle. IMPRESSION: Nasogastric tube tip and side port in stomach. Stomach remains mildly distended with air. No other bowel dilatation. No free air demonstrable. Electronically Signed   By: Bretta Bang III M.D.   On: 05/26/2020 14:37   DG CHEST PORT 1 VIEW  Result Date: 05/26/2020 CLINICAL DATA:  Shortness of breath EXAM: PORTABLE CHEST 1 VIEW COMPARISON:  May 25, 2020 FINDINGS:  Less airspace opacity in the left lower lobe compared to 1 day prior. Small left pleural effusion. Lungs elsewhere are clear. There is cardiomegaly with pulmonary venous hypertension. Pacemaker leads attached to right atrium and right ventricle. There is aortic atherosclerosis. No adenopathy. Degenerative change noted in each shoulder. IMPRESSION: Less airspace opacity left lower lobe compared to 1 day prior. Small left pleural effusion. Lungs elsewhere clear. Stable cardiomegaly with a degree of pulmonary vascular congestion. Pacemaker leads attached to right atrium and right ventricle. Aortic Atherosclerosis (ICD10-I70.0). Electronically Signed   By: Bretta Bang III M.D.   On: 05/26/2020 13:29   DG CHEST  PORT 1 VIEW  Result Date: 05/25/2020 CLINICAL DATA:  Respiratory failure EXAM: PORTABLE CHEST 1 VIEW COMPARISON:  May 23, 2020 and June 18, 2018 FINDINGS: There is persistent airspace consolidation in the left lower lobe with equivocal left pleural effusion lungs elsewhere are clear. Heart is mildly enlarged with pacemaker leads attached to right atrium right ventricle. There is aortic atherosclerosis. No evident adenopathy. Evidence of old trauma in the proximal right humerus with remodeling, stable. IMPRESSION: Apparent degree of airspace opacity in the left lower lobe, likely due to combination of chronic volume loss and suspected superimposed pneumonia. Equivocal left pleural effusion. Lungs elsewhere clear. Cardiomegaly. Pacemaker leads attached to right atrium and right ventricle. Aortic Atherosclerosis (ICD10-I70.0). Electronically Signed   By: Bretta Bang III M.D.   On: 05/25/2020 11:26   DG CHEST PORT 1 VIEW  Result Date: 05/23/2020 CLINICAL DATA:  87 year old female found down. Right femur fracture. EXAM: PORTABLE CHEST 1 VIEW COMPARISON:  Chest radiographs 06/18/2018 and earlier. FINDINGS: Portable AP upright view at 0653 hours. Chronic cardiomegaly, left chest cardiac pacemaker. Mildly rotated to the left today. No pneumothorax or pulmonary edema. No right pleural effusion. Chronic left lung base hypo ventilation not significantly changed allowing for rotation. Calcified aortic atherosclerosis. Visualized tracheal air column is within normal limits. Stable visualized osseous structures. Paucity of bowel gas in the upper abdomen. IMPRESSION: 1. No acute cardiopulmonary abnormality. 2. Chronic cardiomegaly and left lung base hypoventilation. Electronically Signed   By: Odessa Fleming M.D.   On: 05/23/2020 07:14   DG Knee Right Port  Result Date: 05/23/2020 CLINICAL DATA:  87 year old female found down. Right femur fracture. EXAM: PORTABLE RIGHT KNEE - 1-2 VIEW COMPARISON:  Right femur series  reported separately today. FINDINGS: Spiral fracture of the distal right femur again noted extending to the level of the proximal femoral condyle knee arthroplasty hardware. Cross-table lateral view here demonstrates evidence of a small to moderate joint effusion. Patella appears intact with postoperative changes. Arthroplasty hardware remains normally aligned. Proximal tibia and fibula appear intact. Calcified peripheral vascular disease. IMPRESSION: 1. Spiral fracture distal right femur, detailed separately. 2. Small to moderate right knee joint effusion but no additional fracture about the right knee. Chronic right knee arthroplasty. 3. Calcified peripheral vascular disease. Electronically Signed   By: Odessa Fleming M.D.   On: 05/23/2020 07:15   DG Abd Portable 1V  Result Date: 05/28/2020 CLINICAL DATA:  NG placement EXAM: PORTABLE ABDOMEN - 1 VIEW COMPARISON:  05/27/2020 FINDINGS: NG tube in the stomach. Tip in the body the stomach. Normal bowel gas pattern. Surgical clips in the upper abdomen. IMPRESSION: NG tip in the body the stomach.  Normal bowel gas pattern. Electronically Signed   By: Marlan Palau M.D.   On: 05/28/2020 08:27   DG Abd Portable 1V  Result Date: 05/27/2020 CLINICAL DATA:  Abdominal distension.  NG  tube. EXAM: PORTABLE ABDOMEN - 1 VIEW COMPARISON:  05/26/2020. FINDINGS: NG tube in the body of the stomach. Stomach decompressed. Gas in nondilated large and small bowel loops. Surgical clips in the right abdomen. IMPRESSION: NG tube in the body the stomach.  Nonobstructive bowel gas pattern. Electronically Signed   By: Marlan Palauharles  Clark M.D.   On: 05/27/2020 09:53   DG Abd Portable 1V  Result Date: 05/26/2020 CLINICAL DATA:  Nausea and vomiting EXAM: PORTABLE ABDOMEN - 1 VIEW COMPARISON:  None. FINDINGS: Stomach is moderately distended with air. There is no appreciable small or large bowel dilatation. No air-fluid levels. No free air. There are surgical clips in the right abdomen. Lung bases  are clear. Postoperative change proximal right femur. Multiple foci of vascular calcification in pelvic arterial vessels. IMPRESSION: Stomach moderately distended with air. No small or large bowel dilatation to indicate bowel obstruction. No free air evident on supine examination. Postoperative changes noted in right abdomen. Electronically Signed   By: Bretta BangWilliam  Woodruff III M.D.   On: 05/26/2020 13:27   DG C-Arm 1-60 Min  Result Date: 05/24/2020 CLINICAL DATA:  Right femoral nail rib. EXAM: DG C-ARM 1-60 MIN; RIGHT FEMUR 2 VIEWS FLUOROSCOPY TIME:  Fluoroscopy Time:  1 minutes 41 seconds. Radiation Exposure Index (if provided by the fluoroscopic device): 9.72 mGy. Number of Acquired Spot Images: 7 COMPARISON:  May 23, 2020. FINDINGS: Seven C-arm fluoroscopic images were obtained intraoperatively and submitted for post operative interpretation. These images demonstrate nail and screw fixation of a spiral fracture of the distal femur. Please see the performing provider's procedural report for further detail. IMPRESSION: Intraoperative fluoroscopy, as detailed above. Electronically Signed   By: Feliberto HartsFrederick S Jones MD   On: 05/24/2020 11:49   ECHOCARDIOGRAM COMPLETE  Result Date: 05/27/2020    ECHOCARDIOGRAM REPORT   Patient Name:   Threasa HeadsCARLEEN T Kennan Date of Exam: 05/27/2020 Medical Rec #:  [id number]        Height:       60.0 in Accession #:    [id number]       Weight:       160.7 lb Date of Birth:  [birth year]       BSA:          1.701 m Patient Age:    87 years         BP:           117/49 mmHg Patient Gender: F                HR:           69 bpm. Exam Location:  Inpatient Procedure: 2D Echo, Cardiac Doppler and Color Doppler Indications:     Murmur  History:         Patient has prior history of Echocardiogram examinations, most                  recent 04/28/2017. Pacemaker, COPD, Arrythmias:Atrial                  Fibrillation; Risk Factors:Hypertension. CKD. NICM.  Sonographer:     Ross LudwigArthur Guy RDCS (AE)  Referring Phys:  [id number] [facility] CorporationRANAV M PATEL Diagnosing Phys: Kristeen MissPhilip Nahser MD  Sonographer Comments: Suboptimal subcostal window. IMPRESSIONS  1. Left ventricular ejection fraction, by estimation, is 50 to 55%. The left ventricle has low normal function. The left ventricle demonstrates regional wall motion abnormalities (see scoring diagram/findings for description). Left ventricular diastolic  function could not be evaluated. There is  the interventricular septum is flattened in diastole ('D' shaped left ventricle), consistent with right ventricular volume overload. There is severe akinesis of the left ventricular, basal inferoseptal wall.  2. Right ventricular systolic function is normal. The right ventricular size is moderately enlarged. There is severely elevated pulmonary artery systolic pressure.  3. Left atrial size was moderately dilated.  4. Right atrial size was severely dilated.  5. The mitral valve is abnormal. Mild to moderate mitral valve regurgitation. Moderate mitral annular calcification.  6. Tricuspid valve regurgitation is severe.  7. The aortic valve is tricuspid. There is severe calcifcation of the aortic valve. There is moderate thickening of the aortic valve. Aortic valve regurgitation is trivial. Mild aortic valve stenosis. FINDINGS  Left Ventricle: Left ventricular ejection fraction, by estimation, is 50 to 55%. The left ventricle has low normal function. The left ventricle demonstrates regional wall motion abnormalities. Severe akinesis of the left ventricular, basal inferoseptal wall. The left ventricular internal cavity size was normal in size. There is no left ventricular hypertrophy. The interventricular septum is flattened in diastole ('D' shaped left ventricle), consistent with right ventricular volume overload. Left ventricular diastolic function could not be evaluated due to atrial fibrillation. Left ventricular diastolic function could not be evaluated.  LV Wall Scoring: The basal  inferoseptal segment is akinetic. Right Ventricle: The right ventricular size is moderately enlarged. Right vetricular wall thickness was not well visualized. Right ventricular systolic function is normal. There is severely elevated pulmonary artery systolic pressure. Left Atrium: Left atrial size was moderately dilated. Right Atrium: Right atrial size was severely dilated. Pericardium: The pericardium was not well visualized. Mitral Valve: The mitral valve is abnormal. Moderate mitral annular calcification. Mild to moderate mitral valve regurgitation. Tricuspid Valve: The tricuspid valve is grossly normal. Tricuspid valve regurgitation is severe. Aortic Valve: The aortic valve is tricuspid. There is severe calcifcation of the aortic valve. There is moderate thickening of the aortic valve. There is moderate aortic valve annular calcification. Aortic valve regurgitation is trivial. Aortic regurgitation PHT measures 367 msec. Mild aortic stenosis is present. Aortic valve mean gradient measures 13.3 mmHg. Aortic valve peak gradient measures 24.7 mmHg. Aortic valve area, by VTI measures 0.66 cm. Pulmonic Valve: The pulmonic valve was not well visualized. Pulmonic valve regurgitation is mild to moderate. Aorta: The aortic root and ascending aorta are structurally normal, with no evidence of dilitation. IAS/Shunts: The atrial septum is grossly normal. Additional Comments: A device lead is visualized.  LEFT VENTRICLE PLAX 2D LVIDd:         4.80 cm LVIDs:         3.40 cm LV PW:         1.30 cm LV IVS:        0.90 cm LVOT diam:     1.70 cm LV SV:         33 LV SV Index:   19 LVOT Area:     2.27 cm  RIGHT VENTRICLE RV Basal diam:  4.30 cm RV Mid diam:    3.40 cm RV S prime:     7.83 cm/s TAPSE (M-mode): 2.0 cm LEFT ATRIUM             Index       RIGHT ATRIUM           Index LA diam:        5.30 cm 3.12 cm/m  RA Area:     35.60 cm LA Vol (A2C):   84.6 ml 49.74 ml/m  RA Volume:   130.00 ml 76.43 ml/m LA Vol (A4C):   72.9  ml 42.86 ml/m LA Biplane Vol: 78.6 ml 46.21 ml/m  AORTIC VALVE AV Area (Vmax):    0.72 cm AV Area (Vmean):   0.69 cm AV Area (VTI):     0.66 cm AV Vmax:           248.33 cm/s AV Vmean:          172.000 cm/s AV VTI:            0.499 m AV Peak Grad:      24.7 mmHg AV Mean Grad:      13.3 mmHg LVOT Vmax:         78.33 cm/s LVOT Vmean:        52.567 cm/s LVOT VTI:          0.146 m LVOT/AV VTI ratio: 0.29 AI PHT:            367 msec  AORTA Ao Root diam: 3.20 cm Ao Asc diam:  3.70 cm TRICUSPID VALVE TR Peak grad:   72.2 mmHg TR Vmax:        425.00 cm/s  SHUNTS Systemic VTI:  0.15 m Systemic Diam: 1.70 cm Kristeen Miss MD Electronically signed by Kristeen Miss MD Signature Date/Time: 05/27/2020/10:35:04 AM    Final (Updated)    DG FEMUR, MIN 2 VIEWS RIGHT  Result Date: 05/24/2020 CLINICAL DATA:  Right femoral nail rib. EXAM: DG C-ARM 1-60 MIN; RIGHT FEMUR 2 VIEWS FLUOROSCOPY TIME:  Fluoroscopy Time:  1 minutes 41 seconds. Radiation Exposure Index (if provided by the fluoroscopic device): 9.72 mGy. Number of Acquired Spot Images: 7 COMPARISON:  May 23, 2020. FINDINGS: Seven C-arm fluoroscopic images were obtained intraoperatively and submitted for post operative interpretation. These images demonstrate nail and screw fixation of a spiral fracture of the distal femur. Please see the performing provider's procedural report for further detail. IMPRESSION: Intraoperative fluoroscopy, as detailed above. Electronically Signed   By: Feliberto Harts MD   On: 05/24/2020 11:49   DG FEMUR PORT, MIN 2 VIEWS RIGHT  Result Date: 05/24/2020 CLINICAL DATA:  Status post right femur intramedullary nail EXAM: RIGHT FEMUR PORTABLE 2 VIEW COMPARISON:  May 23, 2020. FINDINGS: Status post intramedullary nail and screw fixation of a spiral fracture of the distal femur. Redemonstrated proximal right femur ORIF with lateral plate, interlocking dynamic hip screw. Interval removal of the proximal femoral screws seen on the prior. No  evidence of immediate hardware complication. Approximately 7 mm of medial displacement and some distraction of the femur fracture fragment. Total knee arthroplasty. Vascular calcifications. No evidence of interval fracture. No hip or knee dislocation. IMPRESSION: ORIF of a distal spiral femur fracture, as detailed above. Electronically Signed   By: Feliberto Harts MD   On: 05/24/2020 11:55   DG FEMUR PORT, MIN 2 VIEWS RIGHT  Result Date: 05/23/2020 CLINICAL DATA:  87 year old female found down.  Pain. EXAM: RIGHT FEMUR PORTABLE 2 VIEW COMPARISON:  Intraoperative images from right proximal femur ORIF 06/06/2012. FINDINGS: Proximal right femur ORIF with lateral plate, interlocking dynamic hip screw and cortical screws through the proximal shaft. The proximal right femur appears intact, normally aligned with the acetabulum. However, there is a long nearly 20 cm segment comminuted spiral fracture from the distal left femur shaft to the metadiaphysis terminating at the level of knee arthroplasty femoral condyle hardware. There is 1/2 shaft width medial displacement and mild lateral angulation of the distal fragment. Right total  knee arthroplasty alignment appears maintained. Questionable knee joint effusion. Underlying osteopenia. Extensive calcified atherosclerosis of the iliofemoral arteries. Grossly intact visible pelvis. IMPRESSION: 1. Acute, long comminuted spiral fracture of the distal right femur to the level of the right femoral condyle knee arthroplasty. 2. Chronic proximal right femur ORIF hardware intact. 3. Questionable right knee joint effusion. Electronically Signed   By: Odessa Fleming M.D.   On: 05/23/2020 07:13   IR Paracentesis  Result Date: 05/31/2020 INDICATION: Patient with history hepatic steatosis, abdominal distension, and ascites. Request is made for therapeutic paracentesis. EXAM: ULTRASOUND GUIDED THERAPEUTIC PARACENTESIS MEDICATIONS: 10 mL 1% lidocaine COMPLICATIONS: None immediate.  PROCEDURE: Informed written consent was obtained from the patient after a discussion of the risks, benefits and alternatives to treatment. A timeout was performed prior to the initiation of the procedure. Initial ultrasound scanning demonstrates a large amount of ascites within the left lower abdominal quadrant. The left lower abdomen was prepped and draped in the usual sterile fashion. 1% lidocaine was used for local anesthesia. Following this, a 19 gauge, 7-cm, Yueh catheter was introduced. An ultrasound image was saved for documentation purposes. The paracentesis was performed. The catheter was removed and a dressing was applied. The patient tolerated the procedure well without immediate post procedural complication. FINDINGS: A total of approximately 8 L of clear gold fluid was removed. IMPRESSION: Successful ultrasound-guided paracentesis yielding 8 L of peritoneal fluid. Read by: Elwin Mocha, PA-C Electronically Signed   By: Gilmer Mor D.O.   On: 05/31/2020 17:05       Subjective: Patient says she feels weak and tired. Left upper extremity has developed significant swelling overnight, likely related to IV infiltrating and low albumin  Discharge Exam: Vitals:   06/01/20 1442 06/01/20 2111 06/02/20 0248 06/02/20 0750  BP: (!) 123/32 (!) 115/50 (!) 121/50 (!) 121/49  Pulse: 70 68 72 70  Resp: 17 15 16 18   Temp: 98.1 F (36.7 C) (!) 97.5 F (36.4 C) 98 F (36.7 C) 98 F (36.7 C)  TempSrc: Oral Oral    SpO2: 95% 96% 94% 97%  Weight:      Height:        General: Pt is alert, awake, not in acute distress Cardiovascular: RRR, S1/S2 +, no rubs, no gallops Respiratory: coarse breath sounds at bases Abdominal: Soft, NT, ND, bowel sounds + Extremities: significant swelling in LUE, pulses palpable, cap refill <2 sec    The results of significant diagnostics from this hospitalization (including imaging, microbiology, ancillary and laboratory) are listed below for reference.      Microbiology: Recent Results (from the past 240 hour(s))  Surgical pcr screen     Status: None   Collection Time: 05/23/20  1:24 PM   Specimen: Nasal Mucosa; Nasal Swab  Result Value Ref Range Status   MRSA, PCR NEGATIVE NEGATIVE Final   Staphylococcus aureus NEGATIVE NEGATIVE Final    Comment: (NOTE) The Xpert SA Assay (FDA approved for NASAL specimens in patients 43 years of age and older), is one component of a comprehensive surveillance program. It is not intended to diagnose infection nor to guide or monitor treatment. Performed at [facility], [address], [place], [place] [address]      Labs: BNP (last 3 results) No results for input(s): BNP in the last 8760 hours. Basic Metabolic Panel: Recent Labs  Lab 05/27/20 0337 05/28/20 0319 05/29/20 0337 05/30/20 0219 05/31/20 0358 06/01/20 0017 06/02/20 0115  NA 132* 131*  --  129*  --  126* 130*  K 4.8 4.4  --  4.0  --  4.4 3.4*  CL 99 100  --  97*  --  95* 99  CO2 24 22  --  25  --  21* 22  GLUCOSE 105* 119*  --  119*  --  149* 136*  BUN 42* 47*  --  46*  --  60* 61*  CREATININE 1.30* 1.33*  --  1.25*  --  1.39* 1.23*  CALCIUM 8.1* 7.9*  --  8.0*  --  7.8* 8.0*  MG 2.0 1.9 2.0 1.9 2.0  --   --   PHOS  --   --   --   --   --   --  3.0   Liver Function Tests: Recent Labs  Lab 06/01/20 0017 06/02/20 0115  ALBUMIN 1.5* 2.3*   No results for input(s): LIPASE, AMYLASE in the last 168 hours. No results for input(s): AMMONIA in the last 168 hours. CBC: Recent Labs  Lab 05/27/20 0337 05/28/20 0319 05/30/20 0219 06/01/20 0017 06/02/20 0115  WBC 6.8 10.7* 10.1 10.9* 12.3*  HGB 15.6* 8.1* 8.6* 9.2* 8.7*  HCT 47.4* 25.5* 26.0* 27.2* 26.1*  MCV 87.9 89.5 89.0 88.6 89.4  PLT 206 360 393 423* 434*   Cardiac Enzymes: No results for input(s): CKTOTAL, CKMB, CKMBINDEX, TROPONINI in the last 168 hours. BNP: Invalid input(s): POCBNP CBG: No results for input(s): GLUCAP in the last 168  hours. D-Dimer No results for input(s): DDIMER in the last 72 hours. Hgb A1c No results for input(s): HGBA1C in the last 72 hours. Lipid Profile No results for input(s): CHOL, HDL, LDLCALC, TRIG, CHOLHDL, LDLDIRECT in the last 72 hours. Thyroid function studies No results for input(s): TSH, T4TOTAL, T3FREE, THYROIDAB in the last 72 hours.  Invalid input(s): FREET3 Anemia work up No results for input(s): VITAMINB12, FOLATE, FERRITIN, TIBC, IRON, RETICCTPCT in the last 72 hours. Urinalysis    Component Value Date/Time   COLORURINE YELLOW 06/08/2012 1657   APPEARANCEUR CLEAR 06/08/2012 1657   LABSPEC 1.024 06/08/2012 1657   PHURINE 5.0 06/08/2012 1657   GLUCOSEU NEGATIVE 06/08/2012 1657   HGBUR NEGATIVE 06/08/2012 1657   BILIRUBINUR NEGATIVE 06/08/2012 1657   KETONESUR NEGATIVE 06/08/2012 1657   PROTEINUR NEGATIVE 06/08/2012 1657   UROBILINOGEN 0.2 06/08/2012 1657   NITRITE NEGATIVE 06/08/2012 1657   LEUKOCYTESUR NEGATIVE 06/08/2012 1657   Sepsis Labs Invalid input(s): PROCALCITONIN,  WBC,  LACTICIDVEN Microbiology Recent Results (from the past 240 hour(s))  Surgical pcr screen     Status: None   Collection Time: 05/23/20  1:24 PM   Specimen: Nasal Mucosa; Nasal Swab  Result Value Ref Range Status   MRSA, PCR NEGATIVE NEGATIVE Final   Staphylococcus aureus NEGATIVE NEGATIVE Final    Comment: (NOTE) The Xpert SA Assay (FDA approved for NASAL specimens in patients 41 years of age and older), is one component of a comprehensive surveillance program. It is not intended to diagnose infection nor to guide or monitor treatment. Performed at [facility], [address], [place], [place] [address]      Time coordinating discharge:  SIGNED:   Erick Blinks, MD  [facility] 06/02/2020, 8:56 AM   If 7PM-7AM, please contact night-coverage [email or web address]

## 2020-06-02 NOTE — Plan of Care (Signed)
  Problem: Activity: Goal: Risk for activity intolerance will decrease Outcome: Progressing   Problem: Coping: Goal: Level of anxiety will decrease Outcome: Progressing   Problem: Pain Managment: Goal: General experience of comfort will improve Outcome: Progressing   Problem: Safety: Goal: Ability to remain free from injury will improve Outcome: Progressing   Problem: Skin Integrity: Goal: Risk for impaired skin integrity will decrease Outcome: Progressing   

## 2020-06-02 NOTE — TOC Transition Note (Signed)
Transition of Care [facility]) - CM/SW Discharge Note   Patient Details  Name: Denise [name] MRN: [id number] Date of Birth: 02/14/1924  [facility]) CM/SW Contact:  Carley Hammed, LCSWA Phone Number: 06/02/2020, 8:22 AM   Clinical Narrative:    Pt to be transported to [facility] [place] by [employer] Nurse to call report to [phone] and ask for the 400 hall nurse, Debbie  Final next level of care: Skilled Nursing Facility Barriers to Discharge: Continued Medical Work up   Patient Goals and CMS Choice Patient states their goals for this hospitalization and ongoing recovery are:: patient unable to participate in goal setting, only oriented to self CMS Medicare.gov Compare Post Acute Care list provided to:: Patient Represenative (must comment) Choice offered to / list presented to : Adult Children  Discharge Placement              Patient chooses bed at:  ([facility] [place] [facility]) Patient to be transferred to facility by: [facility] Name of family member notified: Sania Noy Patient and family notified of of transfer: 06/02/20  Discharge Plan and Services     Post Acute Care Choice: Skilled Nursing Facility                               Social Determinants of Health (SDOH) Interventions     Readmission Risk Interventions No flowsheet data found.

## 2020-06-02 NOTE — TOC Progression Note (Signed)
Transition of Care [facility]) - Progression Note    Patient Details  Name: Denise [name] MRN: [id number] Date of Birth: 10-Feb-1924  Transition of Care [facility]) CM/SW Contact  Carley Hammed, [place] Phone Number: 06/02/2020, 10:28 AM  Clinical Narrative:    CSW scheduled pt transport for between 8 and 9 Sunday morning, as that was the only available slot. This was documented last week. Transport could only stay for 15 minutes once they were on the unit, and MD noted he was not sure if pt was ready to go. Transport canceled and will need to be reset up with either [facility] or if the facility wants to set up transport. CSW will follow for DC and transport.   Expected Discharge Plan: Skilled Nursing Facility Barriers to Discharge: Continued Medical Work up  Expected Discharge Plan and Services Expected Discharge Plan: Skilled Nursing Facility     Post Acute Care Choice: Skilled Nursing Facility Living arrangements for the past 2 months: Single Family Home Expected Discharge Date: 06/02/20                                     Social Determinants of Health (SDOH) Interventions    Readmission Risk Interventions No flowsheet data found.

## 2020-06-03 DIAGNOSIS — I4891 Unspecified atrial fibrillation: Secondary | ICD-10-CM | POA: Diagnosis not present

## 2020-06-03 DIAGNOSIS — J9611 Chronic respiratory failure with hypoxia: Secondary | ICD-10-CM | POA: Diagnosis not present

## 2020-06-03 DIAGNOSIS — S72341A Displaced spiral fracture of shaft of right femur, initial encounter for closed fracture: Secondary | ICD-10-CM | POA: Diagnosis not present

## 2020-06-03 DIAGNOSIS — N1831 Chronic kidney disease, stage 3a: Secondary | ICD-10-CM | POA: Diagnosis not present

## 2020-06-03 LAB — RESP PANEL BY RT-PCR (FLU A&B, COVID) ARPGX2
Influenza A by PCR: NEGATIVE
Influenza B by PCR: NEGATIVE
SARS Coronavirus 2 by RT PCR: NEGATIVE

## 2020-06-03 NOTE — Progress Notes (Addendum)
Physical Therapy Treatment Patient Details Name: Denise [name] MRN: [id number] DOB: 05/08/1923 Today's Date: 06/03/2020    History of Present Illness Pt is 87 yo female admitted s/p fall with resultant right femoral  shaft fracture.  Underwent IM Nail 05/24/20.   W/u also revealed potential GI Bleed.  PMH significant for hypertension, nonischemic cardiomyopathy, heart block with pacer, hypothyroidism, chronic 2 L/min supplemental oxygen requirement, atrial fibrillation on Eliquis, CKD 3A, type 2 diabetes mellitus, and suspected dementia    PT Comments    Pt supine in bed on arrival soiled in urine.  Performed LE exercises and rolling for pericare.  Pt less painful and following commands better this session.  Pt continues to benefit from snf placement at d/c to improve strength and function to decrease caregiver burden.      Follow Up Recommendations  SNF     Equipment Recommendations  None recommended by PT    Recommendations for Other Services       Precautions / Restrictions Precautions Precautions: Fall Restrictions Weight Bearing Restrictions: Yes RLE Weight Bearing: Weight bearing as tolerated  06/03/20-DR. Haddix reports it is fine to progress to gt next session as long as pain is well controlled.    Mobility  Bed Mobility Overal bed mobility: Needs Assistance Bed Mobility: Supine to Sit;Sit to Supine Rolling: Mod assist   Supine to sit: Mod assist;+2 for physical assistance     General bed mobility comments: Pt performed rolling to L side with pillow between legs and cues for hand placement on rails.  Pt soiled of urine and required assistance for pericare.  Pt moved to edge of bed with good movement of LEs +2 assistance to elevate trunk into a seated position.  Pt with posterior bias in sitting and required min to mod assistance for sitting balance.    Transfers Overall transfer level: Needs assistance Equipment used: Ambulation equipment used (sara  stedy) Transfers: Sit to/from Stand Sit to Stand: Max assist;+2 physical assistance Stand pivot transfers: Total assist;+2 physical assistance;+2 safety/equipment (in sara stedy.)       General transfer comment: Pt performed sit to stand with flexed hips and posterior bias.  Use of bed pad to raise hips from bed this session as she lacked strength and intiation to perform.  Pt was able to follow commands to hold stedy cross bar to attempt to use UEs to stand.  Ambulation/Gait Ambulation/Gait assistance:  (Unable- current order for WBAT transfers only.)               Stairs             Wheelchair Mobility    Modified Rankin (Stroke Patients Only)       Balance Overall balance assessment: Needs assistance Sitting-balance support: Bilateral upper extremity supported;Feet supported Sitting balance-Leahy Scale: Poor       Standing balance-Leahy Scale: Zero Standing balance comment: external assistance throught out to rise into partial stand.                            Cognition Arousal/Alertness: Awake/alert Behavior During Therapy: WFL for tasks assessed/performed Overall Cognitive Status: History of cognitive impairments - at baseline                                 General Comments: Pt tolerated session better.  HOH so requires increased time and commands repeated.  Exercises General Exercises - Lower Extremity Ankle Circles/Pumps: AROM;Supine;10 reps;Both Quad Sets: AROM;10 reps;Supine;Right Heel Slides: AAROM;Right;10 reps;Supine Hip ABduction/ADduction: AAROM;Right;10 reps;Supine    General Comments        Pertinent Vitals/Pain Pain Assessment: Faces Pain Location: RLE during transfer into standing. Pain Descriptors / Indicators: Discomfort;Grimacing;Guarding Pain Intervention(s): Monitored during session;Repositioned    Home Living                      Prior Function            PT Goals (current  goals can now be found in the care plan section) Acute Rehab PT Goals Patient Stated Goal: get her strong enough to come home Potential to Achieve Goals: Fair Progress towards PT goals: Progressing toward goals    Frequency    Min 3X/week      PT Plan Current plan remains appropriate    Co-evaluation              AM-PAC PT "6 Clicks" Mobility   Outcome Measure  Help needed turning from your back to your side while in a flat bed without using bedrails?: A Lot Help needed moving from lying on your back to sitting on the side of a flat bed without using bedrails?: A Lot Help needed moving to and from a bed to a chair (including a wheelchair)?: A Lot Help needed standing up from a chair using your arms (e.g., wheelchair or bedside chair)?: A Lot Help needed to walk in hospital room?: Total Help needed climbing 3-5 steps with a railing? : Total 6 Click Score: 10    End of Session Equipment Utilized During Treatment: Gait belt Activity Tolerance: Patient limited by pain Patient left: with family/visitor present;in chair;with chair alarm set (no chair alarm nurse call cord in room, RN notified but chair alarm pad cord attached to chair alarm box.) Nurse Communication: Mobility status PT Visit Diagnosis: Repeated falls (R29.6);History of falling (Z91.81);Other abnormalities of gait and mobility (R26.89);Muscle weakness (generalized) (M62.81);Pain Pain - Right/Left: Right Pain - part of body: Leg     Time: 5465-0354 PT Time Calculation (min) (ACUTE ONLY): 26 min  Charges:  $Therapeutic Exercise: 8-22 mins $Therapeutic Activity: 8-22 mins                     Bonney Leitz , PTA Acute Rehabilitation Services Pager [phone] Office [phone]     Denise [name] 06/03/2020, 9:55 AM

## 2020-06-03 NOTE — Plan of Care (Signed)
  Problem: Activity: Goal: Risk for activity intolerance will decrease Outcome: Adequate for Discharge   Problem: Safety: Goal: Ability to remain free from injury will improve Outcome: Adequate for Discharge   

## 2020-06-03 NOTE — Progress Notes (Signed)
Report called to [facility] at [facility]. All questions answered. Pt belongings together to be sent with her. Pt waiting on transport.

## 2020-06-03 NOTE — Plan of Care (Signed)
  Problem: Activity: Goal: Risk for activity intolerance will decrease Outcome: Progressing   Problem: Coping: Goal: Level of anxiety will decrease Outcome: Progressing   Problem: Pain Managment: Goal: General experience of comfort will improve Outcome: Progressing   Problem: Safety: Goal: Ability to remain free from injury will improve Outcome: Progressing   Problem: Skin Integrity: Goal: Risk for impaired skin integrity will decrease Outcome: Progressing   

## 2020-06-03 NOTE — TOC Transition Note (Signed)
Transition of Care [facility]) - CM/SW Discharge Note   Patient Details  Name: Denise [name] MRN: [id number] Date of Birth: 03/08/1924  Transition of Care [facility]) CM/SW Contact:  Carley Hammed, LCSWA Phone Number: 06/03/2020, 1:16 PM   Clinical Narrative:    Pt to be transported to [facility] [place] by [facility] through the facility. Nurse to call report to [phone].   Final next level of care: Skilled Nursing Facility Barriers to Discharge: Barriers Resolved   Patient Goals and CMS Choice Patient states their goals for this hospitalization and ongoing recovery are:: patient unable to participate in goal setting, only oriented to self CMS Medicare.gov Compare Post Acute Care list provided to:: Patient Represenative (must comment) (Son, Fayrene Fearing) Choice offered to / list presented to : Adult Children  Discharge Placement              Patient chooses bed at:  [facility] [place]) Patient to be transferred to facility by: [facility] Name of family member notified: Brayden Betters Patient and family notified of of transfer: 06/03/20  Discharge Plan and Services     Post Acute Care Choice: Skilled Nursing Facility                               Social Determinants of Health (SDOH) Interventions     Readmission Risk Interventions No flowsheet data found.

## 2020-06-03 NOTE — Progress Notes (Signed)
Patient seen and examined. Vitals reviewed and are stable. Lungs are clear bilaterally, she has no edema bilaterally, abdomen is not distended.   A/P:  1.Right femur fracture SP intramedullary retrograde femoral nailing on 3/11 by Dr. Jena Gauss. Presented to [facility] ED in [place] after she was found down at home and had x-rays concerning for mildly displaced right femoral shaft fracture Orthopedics were consulted. Patient underwent surgery on 3/11. Tolerated procedure very well. Weightbearing for transfer to right lower extremity. Reinforcing dressing as needed. From orthopedic perspective okay to resume Eliquis. Appreciate assistance. PT OT recommends SNF.  2.Distended abdomen with intractable nausea and vomiting. Severe constipation. Patient reports recurrent nausea followed by vomiting. Due to severe distention of the stomach NG tube was inserted. Appears to be doing well after insertion with low intermittent suction. Patient was given enema x1 as well. We will continue with bowel regimen. Continue with Reglan. Continue with simethicone. NG tube removed on 3/15. Tolerating full liquid diet. Will advance to soft diet. Abd distention appears to have recurred on 3/16  CT abdomen showed severe ascites Patient for paracentesison 3/18with removal of 8 L of ascitic fluid She has been started on albumin infusions Overall abdominal distention has improved  3.Chronic atrial fibrillation Non-ischemic cardiomyopathy Severe tricuspid murmur. Chronic RV failure. Severe pulmonary hypertension EF had normalized on most recent echo in 2019at which time she had mild LVH, severe LAE, mild AI, mild MR, and moderate TR She appears to have some mild volume overload with pedal edema CHADS-VASc at least 6 (age x2, CHF, HTN, DM, gender) ResumeEliquis. Patient will be restarted on lasix Holding imdur, metoprolol, ramipril and hydralazine due to soft blood pressures  4.  ?Upper GI bleeding Dilutional anemia as well as postoperative acute blood loss anemia. Patient's daughter reported that emesis in ED looked like dark blood Although she has had some vomiting in the hospital, no reported hematemesis or coffee-grounds Require 1 PRBC on 3/11 before surgery, although suspect this is secondary to dilution. After on PRBC transfusion hemoglobin came up to 8.7.  Currently hemoglobin is stable around 8, likely postop anemia.   5.Complete heart block Pacer interrogated in January 2021, had normal device function  6.Type II DMuncontrolled with hyperglycemia. No long-term insulin use. Associated with renal dysfunction. Check CBGs and use a low-intensity SSI for now Blood sugars have been stable  7.AKI onCKD IIIa Increase in creatinine and BUN since paracentesis Patient is likely intravascularly volume depleted with low albumin Treated with gentle hydration with albumin infusion Renal function currently stable  8.Hypertension BP at goal, treat as-needed only for now   9. Chronic hypoxic respiratory failure  Appears stable on her usual 2 Lpm   10. Dementia, mild Per family the patient is disoriented at baseline, walks with a walker, and is able to feed, dress, and toilet on her own  52. Cough. Atelectasis likely. Monitor. Low threshold to initiate antibiotics. Repeat chest x-ray on 3/13 unchanged.  12. Obesity. Placing the patient at high risk for poor outcome as well as slow recovery. Body mass index is 31.39 kg/m. Nutrition Problem: Increased nutrient needs Etiology: post-op healing Interventions: Interventions: Ensure Enlive (each supplement provides 350kcal and 20 grams of protein),MVI  13. Hyponatremia Patient is likely hypovolemic and intravascularly volume depleted Started on gentle saline infusion with albumin infusions Sodium has improved  14. LUE swelling Unlikely DVT since she is already  anticoagulated No signs of cellulitis I suspect this is related to infiltration of IV while receiving IV fluids and low  protein state Will keep upper extremity elevated Started on lasix  15. Goals of care Long talk with paitent's son with whom she has been living with. We discussed her multiple medical problems and frailty. I advised him of the high 6 month mortality rate in people her age who suffer hip fractures. He acknowledged the risk of decline and says he plans to reach out to hospice if that were to occur. He will monitor her over the next few weeks and if she is not showing improvement, then he would wish to take her home with hospice services for end of life care   Patient is stable for SNF discharge. Waiting on transport to be arranged  [employer]

## 2020-06-13 ENCOUNTER — Ambulatory Visit (INDEPENDENT_AMBULATORY_CARE_PROVIDER_SITE_OTHER): Payer: Medicare Other

## 2020-06-13 ENCOUNTER — Other Ambulatory Visit: Payer: Self-pay

## 2020-06-13 DIAGNOSIS — I428 Other cardiomyopathies: Secondary | ICD-10-CM | POA: Diagnosis not present

## 2020-06-13 LAB — CUP PACEART REMOTE DEVICE CHECK
Battery Remaining Longevity: 124 mo
Battery Remaining Percentage: 95.5 %
Battery Voltage: 3.01 V
Brady Statistic RV Percent Paced: 97 %
Date Time Interrogation Session: 20220330170129
Implantable Lead Implant Date: 20060313
Implantable Lead Implant Date: 20060313
Implantable Lead Location: 753859
Implantable Lead Location: 753860
Implantable Pulse Generator Implant Date: 20190604
Lead Channel Impedance Value: 450 Ohm
Lead Channel Pacing Threshold Amplitude: 0.75 V
Lead Channel Pacing Threshold Pulse Width: 0.5 ms
Lead Channel Sensing Intrinsic Amplitude: 12 mV
Lead Channel Setting Pacing Amplitude: 1 V
Lead Channel Setting Pacing Pulse Width: 0.5 ms
Lead Channel Setting Sensing Sensitivity: 4 mV
Pulse Gen Model: 2272
Pulse Gen Serial Number: 9029685

## 2020-06-26 NOTE — Progress Notes (Signed)
Remote pacemaker transmission.   

## 2020-07-02 ENCOUNTER — Emergency Department (HOSPITAL_COMMUNITY): Payer: Medicare Other

## 2020-07-02 ENCOUNTER — Encounter (HOSPITAL_COMMUNITY): Payer: Self-pay | Admitting: Emergency Medicine

## 2020-07-02 ENCOUNTER — Other Ambulatory Visit: Payer: Self-pay

## 2020-07-02 ENCOUNTER — Inpatient Hospital Stay (HOSPITAL_COMMUNITY)
Admission: EM | Admit: 2020-07-02 | Discharge: 2020-07-04 | DRG: 948 | Disposition: A | Discharge status: Skilled Nursing Facility | Payer: Medicare Other | Source: Skilled Nursing Facility | Attending: Internal Medicine | Admitting: Internal Medicine

## 2020-07-02 DIAGNOSIS — Z91013 Allergy to seafood: Secondary | ICD-10-CM

## 2020-07-02 DIAGNOSIS — Z79899 Other long term (current) drug therapy: Secondary | ICD-10-CM

## 2020-07-02 DIAGNOSIS — I428 Other cardiomyopathies: Secondary | ICD-10-CM

## 2020-07-02 DIAGNOSIS — L8990 Pressure ulcer of unspecified site, unspecified stage: Secondary | ICD-10-CM | POA: Insufficient documentation

## 2020-07-02 DIAGNOSIS — I4821 Permanent atrial fibrillation: Secondary | ICD-10-CM | POA: Diagnosis present

## 2020-07-02 DIAGNOSIS — E119 Type 2 diabetes mellitus without complications: Secondary | ICD-10-CM

## 2020-07-02 DIAGNOSIS — I5042 Chronic combined systolic (congestive) and diastolic (congestive) heart failure: Secondary | ICD-10-CM | POA: Diagnosis present

## 2020-07-02 DIAGNOSIS — I442 Atrioventricular block, complete: Secondary | ICD-10-CM | POA: Diagnosis present

## 2020-07-02 DIAGNOSIS — I110 Hypertensive heart disease with heart failure: Secondary | ICD-10-CM | POA: Diagnosis present

## 2020-07-02 DIAGNOSIS — K5900 Constipation, unspecified: Secondary | ICD-10-CM | POA: Diagnosis present

## 2020-07-02 DIAGNOSIS — I071 Rheumatic tricuspid insufficiency: Secondary | ICD-10-CM | POA: Diagnosis present

## 2020-07-02 DIAGNOSIS — R140 Abdominal distension (gaseous): Secondary | ICD-10-CM | POA: Diagnosis present

## 2020-07-02 DIAGNOSIS — K760 Fatty (change of) liver, not elsewhere classified: Secondary | ICD-10-CM | POA: Diagnosis present

## 2020-07-02 DIAGNOSIS — Z7901 Long term (current) use of anticoagulants: Secondary | ICD-10-CM

## 2020-07-02 DIAGNOSIS — I4891 Unspecified atrial fibrillation: Secondary | ICD-10-CM | POA: Diagnosis present

## 2020-07-02 DIAGNOSIS — E785 Hyperlipidemia, unspecified: Secondary | ICD-10-CM | POA: Diagnosis present

## 2020-07-02 DIAGNOSIS — Z825 Family history of asthma and other chronic lower respiratory diseases: Secondary | ICD-10-CM

## 2020-07-02 DIAGNOSIS — R188 Other ascites: Secondary | ICD-10-CM | POA: Diagnosis not present

## 2020-07-02 DIAGNOSIS — F0390 Unspecified dementia without behavioral disturbance: Secondary | ICD-10-CM | POA: Diagnosis present

## 2020-07-02 DIAGNOSIS — K7469 Other cirrhosis of liver: Secondary | ICD-10-CM

## 2020-07-02 DIAGNOSIS — Z823 Family history of stroke: Secondary | ICD-10-CM

## 2020-07-02 DIAGNOSIS — I10 Essential (primary) hypertension: Secondary | ICD-10-CM | POA: Diagnosis present

## 2020-07-02 DIAGNOSIS — Z950 Presence of cardiac pacemaker: Secondary | ICD-10-CM | POA: Diagnosis present

## 2020-07-02 DIAGNOSIS — Z8249 Family history of ischemic heart disease and other diseases of the circulatory system: Secondary | ICD-10-CM

## 2020-07-02 DIAGNOSIS — Z888 Allergy status to other drugs, medicaments and biological substances status: Secondary | ICD-10-CM

## 2020-07-02 DIAGNOSIS — Z66 Do not resuscitate: Secondary | ICD-10-CM | POA: Diagnosis present

## 2020-07-02 DIAGNOSIS — D649 Anemia, unspecified: Secondary | ICD-10-CM | POA: Diagnosis present

## 2020-07-02 DIAGNOSIS — I272 Pulmonary hypertension, unspecified: Secondary | ICD-10-CM | POA: Diagnosis present

## 2020-07-02 DIAGNOSIS — Z20822 Contact with and (suspected) exposure to covid-19: Secondary | ICD-10-CM | POA: Diagnosis present

## 2020-07-02 LAB — CBC WITH DIFFERENTIAL/PLATELET
Abs Immature Granulocytes: 0.06 10*3/uL (ref 0.00–0.07)
Basophils Absolute: 0.1 10*3/uL (ref 0.0–0.1)
Basophils Relative: 1 %
Eosinophils Absolute: 0.1 10*3/uL (ref 0.0–0.5)
Eosinophils Relative: 1 %
HCT: 34.8 % — ABNORMAL LOW (ref 36.0–46.0)
Hemoglobin: 10.6 g/dL — ABNORMAL LOW (ref 12.0–15.0)
Immature Granulocytes: 1 %
Lymphocytes Relative: 20 %
Lymphs Abs: 1.8 10*3/uL (ref 0.7–4.0)
MCH: 29.8 pg (ref 26.0–34.0)
MCHC: 30.5 g/dL (ref 30.0–36.0)
MCV: 97.8 fL (ref 80.0–100.0)
Monocytes Absolute: 0.7 10*3/uL (ref 0.1–1.0)
Monocytes Relative: 8 %
Neutro Abs: 6.2 10*3/uL (ref 1.7–7.7)
Neutrophils Relative %: 69 %
Platelets: 387 10*3/uL (ref 150–400)
RBC: 3.56 MIL/uL — ABNORMAL LOW (ref 3.87–5.11)
RDW: 18.9 % — ABNORMAL HIGH (ref 11.5–15.5)
WBC: 8.9 10*3/uL (ref 4.0–10.5)
nRBC: 0 % (ref 0.0–0.2)

## 2020-07-02 LAB — COMPREHENSIVE METABOLIC PANEL
ALT: 12 U/L (ref 0–44)
AST: 27 U/L (ref 15–41)
Albumin: 1.8 g/dL — ABNORMAL LOW (ref 3.5–5.0)
Alkaline Phosphatase: 306 U/L — ABNORMAL HIGH (ref 38–126)
Anion gap: 7 (ref 5–15)
BUN: 31 mg/dL — ABNORMAL HIGH (ref 8–23)
CO2: 26 mmol/L (ref 22–32)
Calcium: 8.1 mg/dL — ABNORMAL LOW (ref 8.9–10.3)
Chloride: 101 mmol/L (ref 98–111)
Creatinine, Ser: 0.99 mg/dL (ref 0.44–1.00)
GFR, Estimated: 53 mL/min — ABNORMAL LOW (ref >60)
Glucose, Bld: 120 mg/dL — ABNORMAL HIGH (ref 70–99)
Potassium: 5.1 mmol/L (ref 3.5–5.1)
Sodium: 134 mmol/L — ABNORMAL LOW (ref 135–145)
Total Bilirubin: 0.7 mg/dL (ref 0.3–1.2)
Total Protein: 6.9 g/dL (ref 6.5–8.1)

## 2020-07-02 LAB — RESP PANEL BY RT-PCR (FLU A&B, COVID) ARPGX2
Influenza A by PCR: NEGATIVE
Influenza B by PCR: NEGATIVE
SARS Coronavirus 2 by RT PCR: NEGATIVE

## 2020-07-02 LAB — LIPASE, BLOOD: Lipase: 36 U/L (ref 11–51)

## 2020-07-02 LAB — BRAIN NATRIURETIC PEPTIDE: B Natriuretic Peptide: 343 pg/mL — ABNORMAL HIGH (ref 0.0–100.0)

## 2020-07-02 MED ORDER — ONDANSETRON HCL 4 MG/2ML IJ SOLN
4.0000 mg | Freq: Once | INTRAMUSCULAR | Status: AC
  Administered 2020-07-02: 4 mg via INTRAVENOUS
  Filled 2020-07-02: qty 2

## 2020-07-02 NOTE — ED Triage Notes (Signed)
Emergency Medicine Provider Triage Evaluation Note  Denise [name] , a 87 y.o. female  was evaluated in triage.  Pt complains of shortness of breath, fluid in abdomen and legs. Hx of CHF.   Review of Systems  Positive: SOB, abdominal distension  Negative: CP  Physical Exam  BP (!) 138/59   Pulse 72   Temp 97.7 F (36.5 C) (Oral)   Resp 20   SpO2 100%  Gen:   Appears pale  HEENT:  Atraumatic  Resp:  Normal effort  Cardiac:  Normal rate  Abd:   Abdomen distended  MSK:   Moves extremities without difficulty  Neuro:  Speech clear   Medical Decision Making  Medically screening exam initiated at 4:47 PM.  Appropriate orders placed.  [name] was informed that the remainder of the evaluation will be completed by another provider, this initial triage assessment does not replace that evaluation, and the importance of remaining in the ED until their evaluation is complete.  Clinical Impression  Stable, no respiratory distress, however concerned of low Hgb with how pale pt is.    MSE was initiated and I personally evaluated the patient and placed orders (if any) at  4:51 PM on July 02, 2020.  The patient appears stable so that the remainder of the MSE may be completed by another provider.    Farrel Gordon, PA-C 07/02/20 1653

## 2020-07-02 NOTE — ED Provider Notes (Signed)
[facility] EMERGENCY DEPARTMENT Provider Note   CSN: [id number] Arrival date & time: 07/02/20  1631     History Chief Complaint  Patient presents with  . Shortness of Breath    Denise [name] is a 87 y.o. female.  HPI   Patient presents to the ED for evaluation of coughing, emesis, abdominal swelling and leg swelling.  Patient was admitted to the hospital March 10 and discharged on March 21 for a femur fracture.  While in the hospital patient was noted to have a distended abdomen with intractable nausea and vomiting and severe constipation.  During that hospitalization patient was noted to have severe distention of her stomach and a nasogastric tube was inserted.  Patient was also given an enema.  Patient had a CT scan on March 16 and that time it showed she had severe ascites and underwent a paracentesis.  Son states they went to follow-up with the orthopedic doctor today.  While he was there he was concerned about her appearance and thought she needed to come to the emergency room for evaluation.  The son states patient started having recurrent abdominal swelling.  She also started having episodes of cough/posttussive emesis.  She has not been eating as much.  No known fevers.  She does have chronic leg swelling but not sure if this is any worse than it has been.  Patient has not been complaining of any new pain.  No diarrhea.  Past Medical History:  Diagnosis Date  . CHF (congestive heart failure) (HCC)   . Diabetes mellitus without complication (HCC)   . Hypertension   . LVH (left ventricular hypertrophy)   . Mitral regurgitation    mod to severe  . Permanent atrial fibrillation (HCC)   . Presence of permanent cardiac pacemaker   . Pulmonary hypertension (HCC)    severe  . Thyroid disease   . Tricuspid regurgitation    severe    Patient Active Problem List   Diagnosis Date Noted  . Ascites 07/02/2020  . Closed fracture of right femur (HCC) 05/23/2020   . Chronic respiratory failure with hypoxia (HCC) 05/23/2020  . Anemia due to chronic kidney disease 05/23/2020  . Femur fracture, right (HCC) 05/23/2020  . Chronic kidney disease, stage 3a (HCC) 06/21/2018  . Volume overload 06/18/2018  . Thyroid disease 06/17/2018  . Acute on chronic combined systolic (congestive) and diastolic (congestive) heart failure (HCC) 06/17/2018  . Elevated troponin 06/17/2018  . Abdominal distention 06/17/2018  . Pacemaker battery depletion 08/15/2017  . Chronic anticoagulation - coumadin, CHADS2VASC=5 06/07/2015  . Chronic combined systolic and diastolic CHF, NYHA class 2 (HCC) 06/07/2015  . Heme positive stool 06/07/2015  . GIB (gastrointestinal bleeding) 06/07/2015  . Hyperlipidemia 04/11/2015  . Acute on chronic combined systolic and diastolic CHF (congestive heart failure) (HCC) 01/08/2015  . Nonischemic cardiomyopathy (HCC) 10/20/2012  . CHB (complete heart block) (HCC) 10/20/2012  . Pacemaker 09/06/2012  . Fracture of right hip (HCC) 06/05/2012  . Fracture of right humerus 06/05/2012  . Atrial fibrillation (HCC) 06/05/2012  . DM (diabetes mellitus) (HCC) 06/05/2012  . HTN (hypertension) 06/05/2012  . Hypertension 06/05/2012  . Warfarin-induced coagulopathy (HCC) 06/05/2012    Past Surgical History:  Procedure Laterality Date  .  INTRAMEDULLARY (IM) RETROGRADE FEMORAL NAILING (Right Leg Upper) Right 05/24/2020  . ABDOMINAL HYSTERECTOMY    . CARDIOVERSION  5 /18/ 2000   unsuccessful  . CHOLECYSTECTOMY    . COMPRESSION HIP SCREW Right 06/06/2012   Procedure:  Open Reduction Internal Fixation Right HIp;  Surgeon: Dannielle Huh, MD;  Location: [facility] OR;  Service: Orthopedics;  Laterality: Right;  . ESOPHAGOGASTRODUODENOSCOPY N/A 06/09/2015   Procedure: ESOPHAGOGASTRODUODENOSCOPY (EGD);  Surgeon: Charlott Rakes, MD;  Location: [facility] ENDOSCOPY;  Service: Endoscopy;  Laterality: N/A;  . FEMUR IM NAIL Right 05/24/2020   Procedure: INTRAMEDULLARY (IM)  RETROGRADE FEMORAL NAILING;  Surgeon: Roby Lofts, MD;  Location: [facility] OR;  Service: Orthopedics;  Laterality: Right;  . IR PARACENTESIS  05/31/2020  . knee replacement     R  . NM MYOCAR PERF WALL MOTION  02/28/2009   No signficant ischemia  . PACEMAKER INSERTION  05/26/2004   [facility]  . PPM GENERATOR CHANGEOUT N/A 08/17/2017   Procedure: PPM GENERATOR CHANGEOUT;  Surgeon: Thurmon Fair, MD;  Location: [facility] INVASIVE CV LAB;  Service: Cardiovascular;  Laterality: N/A;  . THYROIDECTOMY    . TUBAL LIGATION    . US ECHOCARDIOGRAPHY  02/25/2011   mod to severe MR,LA severe dilated,mild annular ca+,mod. TR,mod PI,mod ca+ of the aortic valve leaflet     OB History   No obstetric history on file.     Family History  Problem Relation Age of Onset  . Asthma Mother   . Heart attack Father   . Stroke Sister   . Heart attack Brother   . Cancer - Colon Brother   . Gallbladder disease Maternal Grandmother   . Stroke Maternal Grandfather   . Heart disease Child     Social History   Tobacco Use  . Smoking status: Never Smoker  . Smokeless tobacco: Never Used  Vaping Use  . Vaping Use: Never used  Substance Use Topics  . Alcohol use: No  . Drug use: No    Home Medications Prior to Admission medications   Medication Sig Start Date End Date Taking? Authorizing Provider  acetaminophen (TYLENOL) 325 MG tablet Take 2 tablets (650 mg total) by mouth every 6 (six) hours as needed for mild pain, fever or headache. 06/02/20   Erick Blinks, MD  atorvastatin (LIPITOR) 10 MG tablet TAKE ONE TABLET BY MOUTH EVERY EVENING Patient taking differently: Take 10 mg by mouth at bedtime. 12/26/19   Lennette Bihari, MD  ELIQUIS 5 MG TABS tablet Take 5 mg by mouth 2 (two) times daily. 05/15/20   [provider]  furosemide (LASIX) 40 MG tablet Take 1-2 tablets (40-80 mg total) by mouth See admin instructions. Take 1 tablet (40mg ) by mouth daily except on Tuesday and Friday take 1 tablet (40 mg  totally) 2 times daily 06/02/20   06/04/20, MD  methimazole (TAPAZOLE) 10 MG tablet TAKE 1/2 TABLET BY MOUTH ONCE DAILY Patient taking differently: Take 5 mg by mouth daily. 01/10/19   01/12/19, MD  polyethylene glycol (MIRALAX / GLYCOLAX) 17 g packet Take 17 g by mouth daily. 06/02/20   06/04/20, MD  potassium chloride SA (KLOR-CON) 20 MEQ tablet Take 40 mEq daily except on Tuesday and Friday take 60 mEg (3 tablets) Patient taking differently: Take 20-40 mEq by mouth See admin instructions. Take 1 tablet (20 mEq totally) by mouth 2 times daily; except on Tuesday and Friday take extra 1 tablet (20 mEq totally) by mouth in the evening 10/19/19   [place], 12/19/19, MD    Allergies    Iohexol and Shrimp [shellfish allergy]  Review of Systems   Review of Systems  All other systems reviewed and are negative.   Physical Exam Updated Vital Signs BP  125/88   Pulse 70   Temp 97.7 F (36.5 C) (Oral)   Resp (!) 24   SpO2 96%   Physical Exam Vitals and nursing note reviewed.  Constitutional:      Appearance: She is not diaphoretic.     Comments: Elderly, frail  HENT:     Head: Normocephalic and atraumatic.     Right Ear: External ear normal.     Left Ear: External ear normal.  Eyes:     General: No scleral icterus.       Right eye: No discharge.        Left eye: No discharge.     Conjunctiva/sclera: Conjunctivae normal.  Neck:     Trachea: No tracheal deviation.  Cardiovascular:     Rate and Rhythm: Normal rate and regular rhythm.  Pulmonary:     Effort: Pulmonary effort is normal. No respiratory distress.     Breath sounds: No stridor. Rhonchi present. No wheezing or rales.  Abdominal:     General: Abdomen is protuberant. Bowel sounds are normal. There is distension.     Palpations: Abdomen is soft.     Tenderness: There is no abdominal tenderness. There is no guarding or rebound.     Comments: Abdominal distention, vomiting clear liquid at the bedside   Musculoskeletal:        General: No tenderness.     Cervical back: Neck supple.     Right lower leg: Edema present.     Left lower leg: Edema present.     Comments: Right greater than left, no erythema  Skin:    General: Skin is warm and dry.     Findings: No rash.  Neurological:     Mental Status: She is alert.     Cranial Nerves: No cranial nerve deficit (no facial droop, extraocular movements intact, no slurred speech).     Sensory: No sensory deficit.     Motor: No abnormal muscle tone or seizure activity.     Coordination: Coordination normal.     ED Results / Procedures / Treatments   Labs (all labs ordered are listed, but only abnormal results are displayed) Labs Reviewed  CBC WITH DIFFERENTIAL/PLATELET - Abnormal; Notable for the following components:      Result Value   RBC 3.56 (*)    Hemoglobin 10.6 (*)    HCT 34.8 (*)    RDW 18.9 (*)    All other components within normal limits  COMPREHENSIVE METABOLIC PANEL - Abnormal; Notable for the following components:   Sodium 134 (*)    Glucose, Bld 120 (*)    BUN 31 (*)    Calcium 8.1 (*)    Albumin 1.8 (*)    Alkaline Phosphatase 306 (*)    GFR, Estimated 53 (*)    All other components within normal limits  BRAIN NATRIURETIC PEPTIDE - Abnormal; Notable for the following components:   B Natriuretic Peptide 343.0 (*)    All other components within normal limits  RESP PANEL BY RT-PCR (FLU A&B, COVID) ARPGX2  LIPASE, BLOOD  URINALYSIS, ROUTINE W REFLEX MICROSCOPIC    EKG EKG Interpretation  Date/Time:  Tuesday July 02 2020 16:48:53 EDT Ventricular Rate:  72 PR Interval:    QRS Duration: 158 QT Interval:  450 QTC Calculation: 492 R Axis:   265 Text Interpretation: Ventricular-paced rhythm Abnormal ECG No significant change since last tracing Confirmed by Linwood Dibbles [phone]) on 07/02/2020 5:35:28 PM   Radiology DG Abdomen 1 View  Result Date: 07/02/2020 CLINICAL DATA:  Vomiting, abdominal distention EXAM:  ABDOMEN - 1 VIEW COMPARISON:  05/28/2020.  CT 05/30/2020 FINDINGS: Nonobstructive bowel gas pattern. Prior cholecystectomy. No organomegaly or suspicious calcification. No evidence of free air. IMPRESSION: No bowel obstruction or free air. Electronically Signed   By: Charlett Nose M.D.   On: 07/02/2020 19:35   DG Chest Port 1 View  Result Date: 07/02/2020 CLINICAL DATA:  Shortness of breath, abdominal distention EXAM: PORTABLE CHEST 1 VIEW COMPARISON:  05/26/2020 FINDINGS: Left pacer remains in place, unchanged. Cardiomegaly. Small left pleural effusion with left base atelectasis. No confluent opacity or effusion on the right. Mild vascular congestion. IMPRESSION: Cardiomegaly, vascular congestion. Small left effusion with left base atelectasis. Electronically Signed   By: Charlett Nose M.D.   On: 07/02/2020 19:34   US Abdomen Limited RUQ (LIVER/GB)  Result Date: 07/02/2020 CLINICAL DATA:  Abdominal distension EXAM: ULTRASOUND ABDOMEN LIMITED RIGHT UPPER QUADRANT COMPARISON:  05/30/2020 FINDINGS: Gallbladder: Surgically removed Common bile duct: Diameter: 10.8 mm consistent with the post cholecystectomy state. Liver: Diffusely increased in echogenicity consistent with a degree of underlying cirrhosis given the ascites. Portal vein shows bidirectional flow consistent with the underlying hepatic change. Other: Significant ascites is noted similar to that seen on the prior exam. Large pocket is noted in the left lower quadrant. IMPRESSION: Cirrhosis of the liver with significant ascites re-accumulated when compared with the prior exam following paracentesis. Status post cholecystectomy. Electronically Signed   By: Alcide Clever M.D.   On: 07/02/2020 23:25    Procedures Procedures   Medications Ordered in ED Medications  ondansetron (ZOFRAN) injection 4 mg (4 mg Intravenous Given 07/02/20 1813)    ED Course  I have reviewed the triage vital signs and the nursing notes.  Pertinent labs & imaging results  that were available during my care of the patient were reviewed by me and considered in my medical decision making (see chart for details).  Clinical Course as of 07/02/20 2339  Tue Jul 02, 2020  2043 Patient's BNP is unremarkable.  COVID test is negative. [JK]  2044 CBC shows improving anemia [JK]  2044 Electrolyte panel was unremarkable [JK]  2044 Abdominal x-ray unremarkable.  Chest x-ray does show some vascular congestion [JK]    Clinical Course User Index [JK] Linwood Dibbles, MD   MDM Rules/Calculators/A&P                          Pt presents for evaluation of shortness of breath, abd and leg swelling.  Recent hospitalization where she had gastric outrobstruction and ascites. No obstruction today.  No signs of chf.  Pt does have a large volume of ascites again.  Unclear what the etiology of this is.  Pt had IR paracentesis on 3/18 but I do not see any fluid studies.  ?felt to be related to her hypoalbuminemia?  Recurrent ascities today.  Pt lives one hour away from this location.  Not a good candidate for outpt ir paracentesis.   Will consult with medical service for admission. Final Clinical Impression(s) / ED Diagnoses Final diagnoses:  Other ascites      Linwood Dibbles, MD 07/02/20 2339

## 2020-07-02 NOTE — ED Triage Notes (Signed)
Pt brought in by her son, pt with abdominal distension, and shortness of breath, worse with exertion, some BLE swelling. Son reports hx having to have fluid drawn from her abdomen. Pt appears pale, no other complaints at this time.

## 2020-07-03 ENCOUNTER — Encounter (HOSPITAL_COMMUNITY): Payer: Self-pay | Admitting: Internal Medicine

## 2020-07-03 ENCOUNTER — Observation Stay (HOSPITAL_COMMUNITY): Payer: Medicare Other

## 2020-07-03 DIAGNOSIS — Z20822 Contact with and (suspected) exposure to covid-19: Secondary | ICD-10-CM | POA: Diagnosis present

## 2020-07-03 DIAGNOSIS — I5042 Chronic combined systolic (congestive) and diastolic (congestive) heart failure: Secondary | ICD-10-CM | POA: Diagnosis present

## 2020-07-03 DIAGNOSIS — Z7901 Long term (current) use of anticoagulants: Secondary | ICD-10-CM | POA: Diagnosis not present

## 2020-07-03 DIAGNOSIS — K5900 Constipation, unspecified: Secondary | ICD-10-CM | POA: Diagnosis present

## 2020-07-03 DIAGNOSIS — R188 Other ascites: Principal | ICD-10-CM

## 2020-07-03 DIAGNOSIS — Z825 Family history of asthma and other chronic lower respiratory diseases: Secondary | ICD-10-CM | POA: Diagnosis not present

## 2020-07-03 DIAGNOSIS — I110 Hypertensive heart disease with heart failure: Secondary | ICD-10-CM | POA: Diagnosis present

## 2020-07-03 DIAGNOSIS — Z66 Do not resuscitate: Secondary | ICD-10-CM | POA: Diagnosis present

## 2020-07-03 DIAGNOSIS — Z888 Allergy status to other drugs, medicaments and biological substances status: Secondary | ICD-10-CM | POA: Diagnosis not present

## 2020-07-03 DIAGNOSIS — Z91013 Allergy to seafood: Secondary | ICD-10-CM | POA: Diagnosis not present

## 2020-07-03 DIAGNOSIS — E785 Hyperlipidemia, unspecified: Secondary | ICD-10-CM | POA: Diagnosis present

## 2020-07-03 DIAGNOSIS — I428 Other cardiomyopathies: Secondary | ICD-10-CM | POA: Diagnosis present

## 2020-07-03 DIAGNOSIS — K760 Fatty (change of) liver, not elsewhere classified: Secondary | ICD-10-CM | POA: Diagnosis present

## 2020-07-03 DIAGNOSIS — K7469 Other cirrhosis of liver: Secondary | ICD-10-CM | POA: Diagnosis present

## 2020-07-03 DIAGNOSIS — R140 Abdominal distension (gaseous): Secondary | ICD-10-CM | POA: Diagnosis present

## 2020-07-03 DIAGNOSIS — I4891 Unspecified atrial fibrillation: Secondary | ICD-10-CM | POA: Diagnosis not present

## 2020-07-03 DIAGNOSIS — I442 Atrioventricular block, complete: Secondary | ICD-10-CM | POA: Diagnosis present

## 2020-07-03 DIAGNOSIS — Z950 Presence of cardiac pacemaker: Secondary | ICD-10-CM | POA: Diagnosis not present

## 2020-07-03 DIAGNOSIS — I272 Pulmonary hypertension, unspecified: Secondary | ICD-10-CM | POA: Diagnosis present

## 2020-07-03 DIAGNOSIS — Z8249 Family history of ischemic heart disease and other diseases of the circulatory system: Secondary | ICD-10-CM | POA: Diagnosis not present

## 2020-07-03 DIAGNOSIS — I071 Rheumatic tricuspid insufficiency: Secondary | ICD-10-CM | POA: Diagnosis present

## 2020-07-03 DIAGNOSIS — Z823 Family history of stroke: Secondary | ICD-10-CM | POA: Diagnosis not present

## 2020-07-03 DIAGNOSIS — I4821 Permanent atrial fibrillation: Secondary | ICD-10-CM | POA: Diagnosis present

## 2020-07-03 DIAGNOSIS — F0390 Unspecified dementia without behavioral disturbance: Secondary | ICD-10-CM | POA: Diagnosis present

## 2020-07-03 DIAGNOSIS — E119 Type 2 diabetes mellitus without complications: Secondary | ICD-10-CM | POA: Diagnosis present

## 2020-07-03 HISTORY — PX: IR PARACENTESIS: IMG2679

## 2020-07-03 LAB — BASIC METABOLIC PANEL
Anion gap: 6 (ref 5–15)
BUN: 33 mg/dL — ABNORMAL HIGH (ref 8–23)
CO2: 25 mmol/L (ref 22–32)
Calcium: 8.1 mg/dL — ABNORMAL LOW (ref 8.9–10.3)
Chloride: 105 mmol/L (ref 98–111)
Creatinine, Ser: 0.97 mg/dL (ref 0.44–1.00)
GFR, Estimated: 54 mL/min — ABNORMAL LOW (ref >60)
Glucose, Bld: 90 mg/dL (ref 70–99)
Potassium: 5 mmol/L (ref 3.5–5.1)
Sodium: 136 mmol/L (ref 135–145)

## 2020-07-03 LAB — GRAM STAIN

## 2020-07-03 LAB — PROTEIN, PLEURAL OR PERITONEAL FLUID: Total protein, fluid: <3 g/dL

## 2020-07-03 LAB — CBC
HCT: 33.5 % — ABNORMAL LOW (ref 36.0–46.0)
Hemoglobin: 10 g/dL — ABNORMAL LOW (ref 12.0–15.0)
MCH: 29.2 pg (ref 26.0–34.0)
MCHC: 29.9 g/dL — ABNORMAL LOW (ref 30.0–36.0)
MCV: 97.7 fL (ref 80.0–100.0)
Platelets: 372 10*3/uL (ref 150–400)
RBC: 3.43 MIL/uL — ABNORMAL LOW (ref 3.87–5.11)
RDW: 18.8 % — ABNORMAL HIGH (ref 11.5–15.5)
WBC: 8 10*3/uL (ref 4.0–10.5)
nRBC: 0 % (ref 0.0–0.2)

## 2020-07-03 LAB — URINALYSIS, ROUTINE W REFLEX MICROSCOPIC
Bilirubin Urine: NEGATIVE
Glucose, UA: NEGATIVE mg/dL
Ketones, ur: NEGATIVE mg/dL
Nitrite: NEGATIVE
Protein, ur: NEGATIVE mg/dL
Specific Gravity, Urine: 1.02 (ref 1.005–1.030)
pH: 6 (ref 5.0–8.0)

## 2020-07-03 LAB — BODY FLUID CELL COUNT WITH DIFFERENTIAL
Eos, Fluid: 0 %
Lymphs, Fluid: 19 %
Monocyte-Macrophage-Serous Fluid: 63 % (ref 50–90)
Neutrophil Count, Fluid: 18 % (ref 0–25)
Total Nucleated Cell Count, Fluid: 137 cu mm (ref 0–1000)

## 2020-07-03 LAB — ALBUMIN, PLEURAL OR PERITONEAL FLUID: Albumin, Fluid: <1 g/dL

## 2020-07-03 LAB — URINALYSIS, MICROSCOPIC (REFLEX)

## 2020-07-03 LAB — GLUCOSE, PLEURAL OR PERITONEAL FLUID: Glucose, Fluid: 94 mg/dL

## 2020-07-03 MED ORDER — SODIUM CHLORIDE 0.9 % IV SOLN
2.0000 g | Freq: Every day | INTRAVENOUS | Status: DC
  Administered 2020-07-03 – 2020-07-04 (×2): 2 g via INTRAVENOUS
  Filled 2020-07-03 (×2): qty 2
  Filled 2020-07-03: qty 20

## 2020-07-03 MED ORDER — POLYETHYLENE GLYCOL 3350 17 G PO PACK
17.0000 g | PACK | Freq: Every day | ORAL | Status: DC
  Administered 2020-07-03 – 2020-07-04 (×2): 17 g via ORAL
  Filled 2020-07-03 (×2): qty 1

## 2020-07-03 MED ORDER — MAGNESIUM HYDROXIDE 400 MG/5ML PO SUSP: 30.0000 mL | Freq: Every day | ORAL | Status: DC | PRN

## 2020-07-03 MED ORDER — FUROSEMIDE 40 MG PO TABS: 40.0000 mg | ORAL_TABLET | ORAL | Status: DC

## 2020-07-03 MED ORDER — ATORVASTATIN CALCIUM 10 MG PO TABS
10.0000 mg | ORAL_TABLET | Freq: Every day | ORAL | Status: DC
  Administered 2020-07-03: 10 mg via ORAL
  Filled 2020-07-03: qty 1

## 2020-07-03 MED ORDER — ONDANSETRON HCL 4 MG/2ML IJ SOLN
4.0000 mg | Freq: Four times a day (QID) | INTRAMUSCULAR | Status: DC | PRN
  Administered 2020-07-04: 4 mg via INTRAVENOUS
  Filled 2020-07-03: qty 2

## 2020-07-03 MED ORDER — FUROSEMIDE 40 MG PO TABS
40.0000 mg | ORAL_TABLET | ORAL | Status: DC
  Administered 2020-07-04: 40 mg via ORAL
  Filled 2020-07-03: qty 1

## 2020-07-03 MED ORDER — POTASSIUM CHLORIDE CRYS ER 20 MEQ PO TBCR
40.0000 meq | EXTENDED_RELEASE_TABLET | ORAL | Status: DC
Start: 2020-07-03 — End: 2020-07-03

## 2020-07-03 MED ORDER — POTASSIUM CHLORIDE CRYS ER 20 MEQ PO TBCR: 60.0000 meq | EXTENDED_RELEASE_TABLET | ORAL | Status: DC

## 2020-07-03 MED ORDER — METHIMAZOLE 5 MG PO TABS
5.0000 mg | ORAL_TABLET | Freq: Every day | ORAL | Status: DC
  Administered 2020-07-03 – 2020-07-04 (×2): 5 mg via ORAL
  Filled 2020-07-03 (×2): qty 1

## 2020-07-03 MED ORDER — LIDOCAINE HCL (PF) 1 % IJ SOLN
INTRAMUSCULAR | Status: DC | PRN
  Administered 2020-07-03: 10 mL

## 2020-07-03 MED ORDER — APIXABAN 5 MG PO TABS
5.0000 mg | ORAL_TABLET | Freq: Two times a day (BID) | ORAL | Status: DC
  Administered 2020-07-03 – 2020-07-04 (×2): 5 mg via ORAL
  Filled 2020-07-03 (×2): qty 1

## 2020-07-03 MED ORDER — BISACODYL 10 MG RE SUPP: 10.0000 mg | Freq: Every day | RECTAL | Status: DC | PRN

## 2020-07-03 MED ORDER — POTASSIUM CHLORIDE CRYS ER 20 MEQ PO TBCR
40.0000 meq | EXTENDED_RELEASE_TABLET | ORAL | Status: DC
  Administered 2020-07-04: 40 meq via ORAL
  Filled 2020-07-03: qty 2

## 2020-07-03 MED ORDER — LIDOCAINE HCL (PF) 1 % IJ SOLN
INTRAMUSCULAR | Status: AC
  Filled 2020-07-03: qty 30

## 2020-07-03 NOTE — H&P (Signed)
History and Physical    Denise [name] HQI:[id number] DOB: 08/20/1923 DOA: 07/02/2020  PCP: Daryl Eastern, MD  Patient coming from: Skilled nursing facility.  Most of the history was obtained from patient's son, previous records.  ER physician.  Chief Complaint: Distended abdomen.  HPI: Denise [name] is a 87 y.o. female with history of chronic atrial fibrillation, nonischemic cardiomyopathy, complete heart block status post pacemaker placement, pulmonary hypertension, chronic anemia who was recently admitted last month for right femur fracture and was discharged to skilled nursing facility had a follow-up with orthopedic surgeon today when patient was found to have distended abdomen with shortness of breath was referred to the ER.  Patient's son states that patient since the surgery was about to be ambulating with patient was found to have increasing abdominal girth and shortness of breath.  During the previous stay last month patient also had abdominal distention at the time was found to have ascites and underwent paracentesis.  Patient son states that last couple of days patient has been throwing up.  ED Course: In the ER patient is not in distress COVID test is negative and KUB unremarkable and chest x-ray shows small left pleural effusion.  Abdomen appears distended.  Labs show hemoglobin of 10.6 with elevated alkaline phosphatase.  BNP was 343.  Patient admitted for distended abdomen likely from ascites of unknown cause.  CT abdomen pelvis is pending.  Review of Systems: As per HPI, rest all negative.   Past Medical History:  Diagnosis Date  . CHF (congestive heart failure) (HCC)   . Diabetes mellitus without complication (HCC)   . Hypertension   . LVH (left ventricular hypertrophy)   . Mitral regurgitation    mod to severe  . Permanent atrial fibrillation (HCC)   . Presence of permanent cardiac pacemaker   . Pulmonary hypertension (HCC)    severe  . Thyroid disease    . Tricuspid regurgitation    severe    Past Surgical History:  Procedure Laterality Date  .  INTRAMEDULLARY (IM) RETROGRADE FEMORAL NAILING (Right Leg Upper) Right 05/24/2020  . ABDOMINAL HYSTERECTOMY    . CARDIOVERSION  5 /18/ 2000   unsuccessful  . CHOLECYSTECTOMY    . COMPRESSION HIP SCREW Right 06/06/2012   Procedure: Open Reduction Internal Fixation Right HIp;  Surgeon: Dannielle Huh, MD;  Location: [facility] OR;  Service: Orthopedics;  Laterality: Right;  . ESOPHAGOGASTRODUODENOSCOPY N/A 06/09/2015   Procedure: ESOPHAGOGASTRODUODENOSCOPY (EGD);  Surgeon: Charlott Rakes, MD;  Location: [facility] ENDOSCOPY;  Service: Endoscopy;  Laterality: N/A;  . FEMUR IM NAIL Right 05/24/2020   Procedure: INTRAMEDULLARY (IM) RETROGRADE FEMORAL NAILING;  Surgeon: Roby Lofts, MD;  Location: [facility] OR;  Service: Orthopedics;  Laterality: Right;  . IR PARACENTESIS  05/31/2020  . knee replacement     R  . NM MYOCAR PERF WALL MOTION  02/28/2009   No signficant ischemia  . PACEMAKER INSERTION  05/26/2004   [facility]  . PPM GENERATOR CHANGEOUT N/A 08/17/2017   Procedure: PPM GENERATOR CHANGEOUT;  Surgeon: Thurmon Fair, MD;  Location: [facility] INVASIVE CV LAB;  Service: Cardiovascular;  Laterality: N/A;  . THYROIDECTOMY    . TUBAL LIGATION    . US ECHOCARDIOGRAPHY  02/25/2011   mod to severe MR,LA severe dilated,mild annular ca+,mod. TR,mod PI,mod ca+ of the aortic valve leaflet     reports that she has never smoked. She has never used smokeless tobacco. She reports that she does not drink alcohol and does not use drugs.  Allergies  Allergen Reactions  . Iohexol Rash and Other (See Comments)     Desc: VOMITING-VERIFIED ON 05-24-04 PRE-PROCEDURE/ ARS   . Shrimp [Shellfish Allergy] Nausea And Vomiting and Rash    Family History  Problem Relation Age of Onset  . Asthma Mother   . Heart attack Father   . Stroke Sister   . Heart attack Brother   . Cancer - Colon Brother   . Gallbladder disease Maternal Grandmother    . Stroke Maternal Grandfather   . Heart disease Child     Prior to Admission medications   Medication Sig Start Date End Date Taking? Authorizing Provider  acetaminophen (TYLENOL) 325 MG tablet Take 2 tablets (650 mg total) by mouth every 6 (six) hours as needed for mild pain, fever or headache. Patient taking differently: Take 650 mg by mouth in the morning and at bedtime. 06/02/20  Yes Erick Blinks, MD  atorvastatin (LIPITOR) 10 MG tablet TAKE ONE TABLET BY MOUTH EVERY EVENING Patient taking differently: Take 10 mg by mouth at bedtime. 12/26/19  Yes Lennette Bihari, MD  bisacodyl (DULCOLAX) 10 MG suppository Place 10 mg rectally as needed for mild constipation. If no BM  from MOM   Yes [provider]  ELIQUIS 5 MG TABS tablet Take 5 mg by mouth 2 (two) times daily. 05/15/20  Yes [provider]  furosemide (LASIX) 40 MG tablet Take 1-2 tablets (40-80 mg total) by mouth See admin instructions. Take 1 tablet (40mg ) by mouth daily except on Tuesday and Friday take 1 tablet (40 mg totally) 2 times daily Patient taking differently: Take 40 mg by mouth See admin instructions. 40 mg once daily on Monday,Wednesday,Thursday,Saturday and Sunday 40 mg twice daily on Tuesday and friday 06/02/20  Yes Memon, 06/04/20, MD  magnesium hydroxide (MILK OF MAGNESIA) 400 MG/5ML suspension Take 30 mLs by mouth as needed for mild constipation. If no BM in 3 days   Yes [provider]  methimazole (TAPAZOLE) 10 MG tablet TAKE 1/2 TABLET BY MOUTH ONCE DAILY Patient taking differently: Take 5 mg by mouth daily. 01/10/19  Yes 01/12/19, MD  Pollen Extracts (PROSTAT PO) Take 30 mLs by mouth in the morning and at bedtime.   Yes [provider]  polyethylene glycol (MIRALAX / GLYCOLAX) 17 g packet Take 17 g by mouth daily. 06/02/20  Yes 06/04/20, MD  potassium chloride SA (KLOR-CON) 20 MEQ tablet Take 40 mEq daily except on Tuesday and Friday take 60 mEg (3 tablets) Patient  taking differently: Take 40-60 mEq by mouth See admin instructions. 40 meq once daily on Monday,Wednesday,Thursday,Saturday and Sunday. 60 meq once daily on Tuesday and friday 10/19/19  Yes Croitoru, Mihai, MD  sodium phosphate Pediatric (FLEET) 3.5-9.5 GM/59ML enema Place 1 enema rectally as needed for severe constipation. If no result from bisacodyl suppository   Yes [provider]  acetaminophen (TYLENOL) 325 MG tablet Take 650 mg by mouth every 12 (twelve) hours as needed for mild pain. X 14 days Patient not taking: Reported on 07/03/2020    [provider]  oxyCODONE (OXY IR/ROXICODONE) 5 MG immediate release tablet Take 2.5 mg by mouth See admin instructions. Every morning as needed for pain x 14 days Patient not taking: No sig reported    [provider]    Physical Exam: Constitutional: Moderately built and nourished. Vitals:   07/03/20 0115 07/03/20 0224 07/03/20 0227 07/03/20 0241  BP: (!) 114/57 (!) 122/56  (!) 135/59  Pulse: 72  70  70  Resp: (!) 26 (!) 25  16  Temp:   97.8 F (36.6 C) 98 F (36.7 C)  TempSrc:   Oral   SpO2: 95% 96%  96%   Eyes: Anicteric no pallor. ENMT: No discharge from the ears eyes nose or mouth. Neck: No mass felt.  No neck rigidity. Respiratory: No rhonchi or crepitations. Cardiovascular: S1-S2 heard. Abdomen: Distended nontender small umbilical hernia seen. Musculoskeletal: No edema. Skin: No rash. Neurologic: Alert awake oriented to name hard of hearing moving all extremities. Psychiatric: Hard of hearing.   Labs on Admission: I have personally reviewed following labs and imaging studies  CBC: Recent Labs  Lab 07/02/20 1652  WBC 8.9  NEUTROABS 6.2  HGB 10.6*  HCT 34.8*  MCV 97.8  PLT 387   Basic Metabolic Panel: Recent Labs  Lab 07/02/20 1652  NA 134*  K 5.1  CL 101  CO2 26  GLUCOSE 120*  BUN 31*  CREATININE 0.99  CALCIUM 8.1*   GFR: CrCl cannot be calculated (Unknown ideal weight.). Liver  Function Tests: Recent Labs  Lab 07/02/20 1652  AST 27  ALT 12  ALKPHOS 306*  BILITOT 0.7  PROT 6.9  ALBUMIN 1.8*   Recent Labs  Lab 07/02/20 1652  LIPASE 36   No results for input(s): AMMONIA in the last 168 hours. Coagulation Profile: No results for input(s): INR, PROTIME in the last 168 hours. Cardiac Enzymes: No results for input(s): CKTOTAL, CKMB, CKMBINDEX, TROPONINI in the last 168 hours. BNP (last 3 results) No results for input(s): PROBNP in the last 8760 hours. HbA1C: No results for input(s): HGBA1C in the last 72 hours. CBG: No results for input(s): GLUCAP in the last 168 hours. Lipid Profile: No results for input(s): CHOL, HDL, LDLCALC, TRIG, CHOLHDL, LDLDIRECT in the last 72 hours. Thyroid Function Tests: No results for input(s): TSH, T4TOTAL, FREET4, T3FREE, THYROIDAB in the last 72 hours. Anemia Panel: No results for input(s): VITAMINB12, FOLATE, FERRITIN, TIBC, IRON, RETICCTPCT in the last 72 hours. Urine analysis:    Component Value Date/Time   COLORURINE YELLOW 06/08/2012 1657   APPEARANCEUR CLEAR 06/08/2012 1657   LABSPEC 1.024 06/08/2012 1657   PHURINE 5.0 06/08/2012 1657   GLUCOSEU NEGATIVE 06/08/2012 1657   HGBUR NEGATIVE 06/08/2012 1657   BILIRUBINUR NEGATIVE 06/08/2012 1657   KETONESUR NEGATIVE 06/08/2012 1657   PROTEINUR NEGATIVE 06/08/2012 1657   UROBILINOGEN 0.2 06/08/2012 1657   NITRITE NEGATIVE 06/08/2012 1657   LEUKOCYTESUR NEGATIVE 06/08/2012 1657   Sepsis Labs: @LABRCNTIP (procalcitonin:4,lacticidven:4) ) Recent Results (from the past 240 hour(s))  Resp Panel by RT-PCR (Flu A&B, Covid) Nasopharyngeal Swab     Status: None   Collection Time: 07/02/20  6:16 PM   Specimen: Nasopharyngeal Swab; Nasopharyngeal(NP) swabs in vial transport medium  Result Value Ref Range Status   SARS Coronavirus 2 by RT PCR NEGATIVE NEGATIVE Final    Comment: (NOTE) SARS-CoV-2 target nucleic acids are NOT DETECTED.  The SARS-CoV-2 RNA is generally  detectable in upper respiratory specimens during the acute phase of infection. The lowest concentration of SARS-CoV-2 viral copies this assay can detect is 138 copies/mL. A negative result does not preclude SARS-Cov-2 infection and should not be used as the sole basis for treatment or other patient management decisions. A negative result may occur with  improper specimen collection/handling, submission of specimen other than nasopharyngeal swab, presence of viral mutation(s) within the areas targeted by this assay, and inadequate number of viral copies(<138 copies/mL). A negative result must be  combined with clinical observations, patient history, and epidemiological information. The expected result is Negative.  Fact Sheet for Patients:  BloggerCourse.com  Fact Sheet for Healthcare Providers:  SeriousBroker.it  This test is no t yet approved or cleared by the [place] FDA and  has been authorized for detection and/or diagnosis of SARS-CoV-2 by FDA under an Emergency Use Authorization (EUA). This EUA will remain  in effect (meaning this test can be used) for the duration of the COVID-19 declaration under Section 564(b)(1) of the Act, 21 U.S.C.section 360bbb-3(b)(1), unless the authorization is terminated  or revoked sooner.       Influenza A by PCR NEGATIVE NEGATIVE Final   Influenza B by PCR NEGATIVE NEGATIVE Final    Comment: (NOTE) The Xpert Xpress SARS-CoV-2/FLU/RSV plus assay is intended as an aid in the diagnosis of influenza from Nasopharyngeal swab specimens and should not be used as a sole basis for treatment. Nasal washings and aspirates are unacceptable for Xpert Xpress SARS-CoV-2/FLU/RSV testing.  Fact Sheet for Patients: BloggerCourse.com  Fact Sheet for Healthcare Providers: SeriousBroker.it  This test is not yet approved or cleared by the [place] FDA  and has been authorized for detection and/or diagnosis of SARS-CoV-2 by FDA under an Emergency Use Authorization (EUA). This EUA will remain in effect (meaning this test can be used) for the duration of the COVID-19 declaration under Section 564(b)(1) of the Act, 21 U.S.C. section 360bbb-3(b)(1), unless the authorization is terminated or revoked.  Performed at [facility], [address] [place], [place] [address]      Radiological Exams on Admission: DG Abdomen 1 View  Result Date: 07/02/2020 CLINICAL DATA:  Vomiting, abdominal distention EXAM: ABDOMEN - 1 VIEW COMPARISON:  05/28/2020.  CT 05/30/2020 FINDINGS: Nonobstructive bowel gas pattern. Prior cholecystectomy. No organomegaly or suspicious calcification. No evidence of free air. IMPRESSION: No bowel obstruction or free air. Electronically Signed   By: Charlett Nose M.D.   On: 07/02/2020 19:35   DG Chest Port 1 View  Result Date: 07/02/2020 CLINICAL DATA:  Shortness of breath, abdominal distention EXAM: PORTABLE CHEST 1 VIEW COMPARISON:  05/26/2020 FINDINGS: Left pacer remains in place, unchanged. Cardiomegaly. Small left pleural effusion with left base atelectasis. No confluent opacity or effusion on the right. Mild vascular congestion. IMPRESSION: Cardiomegaly, vascular congestion. Small left effusion with left base atelectasis. Electronically Signed   By: Charlett Nose M.D.   On: 07/02/2020 19:34   CT RENAL STONE STUDY  Result Date: 07/03/2020 CLINICAL DATA:  Flank pain and abdominal distension EXAM: CT ABDOMEN AND PELVIS WITHOUT CONTRAST TECHNIQUE: Multidetector CT imaging of the abdomen and pelvis was performed following the standard protocol without IV contrast. COMPARISON:  05/30/2020. FINDINGS: LOWER CHEST: Marked cardiomegaly. Small pleural effusions with bibasilar atelectasis. HEPATOBILIARY: Hepatic steatosis with large volume abdominopelvic ascites. Gallbladder is absent. PANCREAS: Normal pancreas. No ductal dilatation  or peripancreatic fluid collection. SPLEEN: Normal. ADRENALS/URINARY TRACT: The adrenal glands are normal. Bilateral renal atrophy. The urinary bladder is normal for degree of distention STOMACH/BOWEL: There is no hiatal hernia. Normal duodenal course and caliber. No small bowel dilatation or inflammation. Rectosigmoid diverticulosis without acute inflammation. VASCULAR/LYMPHATIC: Normal course and caliber of the major abdominal vessels. No abdominal or pelvic lymphadenopathy. REPRODUCTIVE: Status post hysterectomy. No adnexal mass. MUSCULOSKELETAL. Grade 1 anterolisthesis at L4-5 OTHER: None. IMPRESSION: 1. Hepatic steatosis with large volume abdominopelvic ascites. 2. Small pleural effusions with bibasilar atelectasis. 3. Marked cardiomegaly. 4. Rectosigmoid diverticulosis without acute inflammation. Electronically Signed   By: Caryn Bee  Chase Picket M.D.   On: 07/03/2020 02:10   US Abdomen Limited RUQ (LIVER/GB)  Result Date: 07/02/2020 CLINICAL DATA:  Abdominal distension EXAM: ULTRASOUND ABDOMEN LIMITED RIGHT UPPER QUADRANT COMPARISON:  05/30/2020 FINDINGS: Gallbladder: Surgically removed Common bile duct: Diameter: 10.8 mm consistent with the post cholecystectomy state. Liver: Diffusely increased in echogenicity consistent with a degree of underlying cirrhosis given the ascites. Portal vein shows bidirectional flow consistent with the underlying hepatic change. Other: Significant ascites is noted similar to that seen on the prior exam. Large pocket is noted in the left lower quadrant. IMPRESSION: Cirrhosis of the liver with significant ascites re-accumulated when compared with the prior exam following paracentesis. Status post cholecystectomy. Electronically Signed   By: Alcide Clever M.D.   On: 07/02/2020 23:25    EKG: Independently reviewed.  Paced rhythm.  Assessment/Plan Principal Problem:   Ascites Active Problems:   Atrial fibrillation (HCC)   DM (diabetes mellitus) (HCC)   HTN (hypertension)    Pacemaker   Nonischemic cardiomyopathy (HCC)   Chronic combined systolic and diastolic CHF, NYHA class 2 (HCC)    1. Ascites cause not clear.  We will get paracentesis check for cell count differential cytology protein albumin and may consult gastroenterologist in the morning since this is the second episode of paracentesis in the last 1 month.  Empiric antibiotics until we get the paracentesis results. 2. Chronic A. fib on apixaban.  3. Hyperlipidemia on statins. 4. History of nonischemic cardiomyopathy and pulmonary hypertension takes Lasix.  5. Chronic anemia follow CBC. 6. Diabetes mellitus type 2 mention in the chart not on medication.  Follow CBGs.  Last hemoglobin A1c a month ago was 5.9. 7. History of dementia presently not on medications. 8. History of complete heart block status post pacemaker placement.   DVT prophylaxis: Apixaban. Code Status: DNR. Family Communication: Patient's son. Disposition Plan: Back to facility when stable. Consults called: None. Admission status: Observation.   Eduard Clos MD [facility] Pager [phone].  If 7PM-7AM, please contact night-coverage [email or web address] Password [email or web address]  07/03/2020, 3:20 AM

## 2020-07-03 NOTE — Progress Notes (Signed)
Pt admitted to 6n11 from ED via stretcher. Unable to move self from stretcher to bed.  Will continue to monitor

## 2020-07-03 NOTE — Procedures (Signed)
PROCEDURE SUMMARY:  Successful US guided paracentesis from left lateral abdomen.  Yielded 6.1 liters of clear yellow fluid.  No immediate complications.  Patient tolerated well.  EBL = trace  Specimen was sent for labs.  Jon Lall S Delmos Velaquez PA-C 07/03/2020 1:36 PM

## 2020-07-03 NOTE — ED Notes (Signed)
Patient transported to CT 

## 2020-07-03 NOTE — Progress Notes (Signed)
PROGRESS NOTE  Denise [name]  DOB: Apr 23, 1923  PCP: Daryl Eastern, MD OHY:[id number]  DOA: 07/02/2020  LOS: 0 days   Chief Complaint  Patient presents with  . Distended abdomen.   Brief narrative: Denise [name] is a 87 y.o. female with PMH significant for chronic A. fib, nonischemic cardiomyopathy, complete heart block status post pacemaker placement, pulmonary hypertension, chronic anemia who was recently admitted last month for right femur fracture and was discharged to skilled nursing facility had a follow-up with orthopedic surgeon on the day of admission 4/19.   She was noted to have progressively worsening abdominal distention and shortness of breath since the surgery and hence sent to the ED.  During her previous stay last month, patient was found to have ascites and underwent paracentesis.   In the ED, patient was hemodynamically stable. Chest x-ray with small left effusion Abdominal x-ray without bowel obstruction or free air Ultrasound abdomen with liver cirrhosis with significant reaccumulation of ascites CT abdomen pelvis showed hepatic steatosis with large volume ascites. Admitted to hospitalist service.  Subjective: Patient was seen and examined this morning.  Pleasant elderly Caucasian female.  Lying down in bed.  Her son was at bedside who helped with most of the history. Patient was last discharged to SNF after hip fracture surgery.  She has remained in there and has not been home yet. Son does not want any heroic measures done.  Does not want endoscopic evaluation either. Underwent ultrasound-guided paracentesis today with removal of 6.1 L of clear yellow fluid. Chart reviewed Afebrile, remains hemodynamically stable  Assessment/Plan: Recurrent ascites Liver cirrhosis -Etiology of liver cirrhosis unclear - CT abdomen shows finding of hepatic steatosis.  Echo from last month showed severe right volume overload, severe elevated pulmonary artery  pressure and severe TR.  I suspect patient might have liver cirrhosis related to hepatic steatosis as well as congestive hepatopathy from right ventricular overload. -In any case, patient's son Mr. Fayrene Fearing does not want any further invasive work-up for her.  He is however agreeable to paracentesis periodically if needed. -Patient underwent paracentesis and removal of 6.1 L of fluid today. -I inquired about setting up outpatient visits with IR for paracentesis.  According to IR, once patient returns back to the SNF, SNF MD will have to order outpatient paracentesis on regular intervals  Type 2 diabetes mellitus -Last A1c was 5.9 on 3/10.  Chronic A. Fib History of complete heart block  -status post pacemaker placement. -on apixaban.  Resume  Hyperlipidemia  -on statin  H/o nonischemic cardiomyopathy and pulmonary hypertension -Lasix.  -Last echo from last month showed EF of 50 to 55%, severe akinesis of the left ventricular, basal inferoseptal wall, right ventricular volume overload and severely elevated pulmonary artery systolic pressure, severe TR.  Chronic anemia -Hemoglobin stable Recent Labs    05/23/20 1355 05/24/20 0215 05/30/20 0219 06/01/20 0017 06/02/20 0115 07/02/20 1652 07/03/20 0611  HGB 8.0*   < > 8.6* 9.2* 8.7* 10.6* 10.0*  MCV 83.6   < > 89.0 88.6 89.4 97.8 97.7  VITAMINB12 598  --   --   --   --   --   --   FOLATE 12.7  --   --   --   --   --   --   FERRITIN 189  --   --   --   --   --   --   TIBC 160*  --   --   --   --   --   --  IRON 16*  --   --   --   --   --   --   RETICCTPCT 1.7  --   --   --   --   --   --    < > = values in this interval not displayed.   History of dementia  -Currently not on medications.  Mobility: Limited mobility.  Continue PT  Code Status:   Code Status: DNR  Nutritional status: Body mass index is 31.39 kg/m.     Diet Order            Diet Heart Room service appropriate? Yes; Fluid consistency: Thin; Fluid restriction:  1200 mL Fluid  Diet effective now                 DVT prophylaxis:  apixaban (ELIQUIS) tablet 5 mg   Antimicrobials:  None Fluid: None Consultants: None Family Communication:  Son Mr. Fayrene Fearing at bedside  Status is: Observation  The patient will require care spanning > 2 midnights and should be moved to inpatient because: Needs paracentesis and postprocedure hemodynamic monitoring  Dispo: The patient is from: SNF              Anticipated d/c is to: SNF              Patient currently is not medically stable to d/c.   Difficult to place patient No       Infusions:  . cefTRIAXone (ROCEPHIN)  IV 2 g (07/03/20 0522)    Scheduled Meds: . apixaban  5 mg Oral BID  . atorvastatin  10 mg Oral QHS  . [START ON 07/05/2020] furosemide  40 mg Oral 2 times per day on Tue Fri  . furosemide  40 mg Oral Once per day on Sun Mon Wed Thu Sat  . lidocaine (PF)      . methimazole  5 mg Oral Daily  . polyethylene glycol  17 g Oral Daily  . potassium chloride SA  40 mEq Oral Once per day on Sun Mon Wed Thu Sat  . [START ON 07/05/2020] potassium chloride  60 mEq Oral Once per day on Tue Fri    Antimicrobials: Anti-infectives (From admission, onward)   Start     Dose/Rate Route Frequency Ordered Stop   07/03/20 0330  cefTRIAXone (ROCEPHIN) 2 g in sodium chloride 0.9 % 100 mL IVPB        2 g 200 mL/hr over 30 Minutes Intravenous Daily 07/03/20 0319        PRN meds: bisacodyl, lidocaine (PF), magnesium hydroxide, ondansetron (ZOFRAN) IV   Objective: Vitals:   07/03/20 1013 07/03/20 1230  BP: (!) 121/49 (!) 124/46  Pulse: 70 70  Resp: 15 16  Temp: 97.7 F (36.5 C) 97.9 F (36.6 C)  SpO2: 98% 98%    Intake/Output Summary (Last 24 hours) at 07/03/2020 1505 Last data filed at 07/03/2020 0835 Gross per 24 hour  Intake 0 ml  Output 100 ml  Net -100 ml   Filed Weights   07/03/20 1300  Weight: 72.9 kg   Weight change:  Body mass index is 31.39 kg/m.   Physical Exam: General  exam: Pleasant elderly Caucasian female.  Not in physical distress Skin: No rashes, lesions or ulcers. HEENT: Atraumatic, normocephalic, no obvious bleeding Lungs: Clear to auscultation bilaterally CVS: Regular rate and rhythm, no murmur GI/Abd soft, distended from ascites, nontender, bowel sound present CNS: Alert, awake, slow to answer.  Demented at baseline Psychiatry: Mood  appropriate Extremities: No pedal edema, no calf tenderness  Data Review: I have personally reviewed the laboratory data and studies available.  Recent Labs  Lab 07/02/20 1652 07/03/20 0611  WBC 8.9 8.0  NEUTROABS 6.2  --   HGB 10.6* 10.0*  HCT 34.8* 33.5*  MCV 97.8 97.7  PLT 387 372   Recent Labs  Lab 07/02/20 1652 07/03/20 0611  NA 134* 136  K 5.1 5.0  CL 101 105  CO2 26 25  GLUCOSE 120* 90  BUN 31* 33*  CREATININE 0.99 0.97  CALCIUM 8.1* 8.1*    F/u labs ordered Unresulted Labs (From admission, onward)          Start     Ordered   07/03/20 1132  Body fluid cell count with differential  RELEASE UPON ORDERING,   TIMED        07/03/20 1132   07/03/20 1132  Gram stain  RELEASE UPON ORDERING,   STAT        07/03/20 1132          Signed, Lorin Glass, MD [facility] 07/03/2020

## 2020-07-04 DIAGNOSIS — R188 Other ascites: Secondary | ICD-10-CM | POA: Diagnosis not present

## 2020-07-04 DIAGNOSIS — K7469 Other cirrhosis of liver: Secondary | ICD-10-CM

## 2020-07-04 DIAGNOSIS — L8990 Pressure ulcer of unspecified site, unspecified stage: Secondary | ICD-10-CM | POA: Insufficient documentation

## 2020-07-04 LAB — CYTOLOGY - NON PAP

## 2020-07-04 MED ORDER — ONDANSETRON HCL 4 MG PO TABS: 4.0000 mg | ORAL_TABLET | Freq: Three times a day (TID) | ORAL | 1 refills | Status: DC | PRN

## 2020-07-04 NOTE — TOC Initial Note (Signed)
Transition of Care [facility]) - Initial/Assessment Note    Patient Details  Name: Denise [name] MRN: [id number] Date of Birth: Mar 16, 1924  Transition of Care [facility]) CM/SW Contact:    Emeterio Reeve, [place] Phone Number: 07/04/2020, 11:56 AM  Clinical Narrative:                 CSW met with pt at bedside. CSW introduced self and explained her role at the hospital.  Pt Son was present. Pts [place] stated that pt was at [facility] in [place] for about a month. Son reports seeing improvements with her mobility. Jeneen Rinks reports he wants for pt eventually return home. Pt is covid vaccinated with booster.  CSW reached out to [facility] and they confirmed that they are able to take pt back. Admissions coordinator will follow up after 1pm.   Expected Discharge Plan: Skilled Nursing Facility Barriers to Discharge: No Barriers Identified   Patient Goals and CMS Choice Patient states their goals for this hospitalization and ongoing recovery are:: To get well enough to stand and pivot and return home CMS Medicare.gov Compare Post Acute Care list provided to:: Patient Represenative (must comment) Choice offered to / list presented to : Adult Children  Expected Discharge Plan and Services Expected Discharge Plan: [place] arrangements for the past 2 months: Single Family Home Expected Discharge Date: 07/04/20                                    Prior Living Arrangements/Services Living arrangements for the past 2 months: Single Family Home Lives with:: Adult Children Patient language and need for interpreter reviewed:: Yes Do you feel safe going back to the place where you live?: Yes      Need for Family Participation in Patient Care: Yes (Comment) Care giver support system in place?: Yes (comment) Current home services: DME Criminal Activity/Legal Involvement Pertinent to Current Situation/Hospitalization: No - Comment as needed  Activities of Daily  Living      Permission Sought/Granted Permission sought to share information with : Family Chief Financial Officer Permission granted to share information with : Yes, Verbal Permission Granted     Permission granted to share info w AGENCY: SNF  Permission granted to share info w Relationship: Son Jeneen Rinks     Emotional Assessment Appearance:: Appears stated age Attitude/Demeanor/Rapport: Engaged Affect (typically observed): Appropriate Orientation: : Oriented to Self,Oriented to Place,Oriented to  Time Alcohol / Substance Use: Not Applicable Psych Involvement: No (comment)  Admission diagnosis:  Abdominal distention [R14.0] Other ascites [R18.8] Ascites [R18.8] Patient Active Problem List   Diagnosis Date Noted  . Pressure injury of skin 07/04/2020  . Other cirrhosis of liver ([place]) 07/04/2020  . Ascites 07/02/2020  . Closed fracture of right femur ([place]) 05/23/2020  . Chronic respiratory failure with hypoxia ([place]) 05/23/2020  . Anemia due to chronic kidney disease 05/23/2020  . Femur fracture, right ([place]) 05/23/2020  . Chronic kidney disease, stage 3a ([place]) 06/21/2018  . Volume overload 06/18/2018  . Thyroid disease 06/17/2018  . Acute on chronic combined systolic (congestive) and diastolic (congestive) heart failure ([place]) 06/17/2018  . Elevated troponin 06/17/2018  . Abdominal distention 06/17/2018  . Pacemaker battery depletion 08/15/2017  . Chronic anticoagulation - coumadin, CHADS2VASC=5 06/07/2015  . Chronic combined systolic and diastolic CHF, NYHA class 2 ([place]) 06/07/2015  . Heme positive stool 06/07/2015  .  GIB (gastrointestinal bleeding) 06/07/2015  . Hyperlipidemia 04/11/2015  . Acute on chronic combined systolic and diastolic CHF (congestive heart failure) ([place]) 01/08/2015  . Nonischemic cardiomyopathy ([place]) 10/20/2012  . CHB (complete heart block) ([place]) 10/20/2012  . Pacemaker 09/06/2012  . Fracture of right hip ([place]) 06/05/2012  . Fracture of  right humerus 06/05/2012  . Atrial fibrillation ([place]) 06/05/2012  . DM (diabetes mellitus) ([place]) 06/05/2012  . HTN (hypertension) 06/05/2012  . Hypertension 06/05/2012  . Warfarin-induced coagulopathy ([place]) 06/05/2012   PCP:  Burnard Bunting, MD Pharmacy:   [facility] - [place], [place] - [address] [place] [address] Phone: [phone] Fax: [phone]     Social Determinants of Health (SDOH) Interventions    Readmission Risk Interventions No flowsheet data found.  Emeterio Reeve, Latanya Presser, [place] Social Worker [phone]

## 2020-07-04 NOTE — Evaluation (Signed)
Physical Therapy Evaluation Patient Details Name: Denise [name] MRN: [id number] DOB: Apr 23, 1923 Today's Date: 07/04/2020   History of Present Illness  Pt is a 87 y.o. female who presented 4/19 from a SNF with a distended abdomen, recent emesis, and SOB. Of note, pt recently in hospital with R femur fx and was found to have ascites and underwent paracentesis. Chest x-ray with small L effusion with L base atelectasis. No bowel obstruction or free air noted with imaging. Pt found to have cirrhosis of the liver with significant ascites re-accumulated when compared with the prior exam following paracentesis. S/p US guided paracentesis from left lateral abdomen 4/20.  PMH: chronic atrial fibrillation, nonischemic cardiomyopathy, complete heart block status post pacemaker placement, pulmonary hypertension, and chronic anemia.    Clinical Impression  Pt presents with condition above and deficits mentioned below, see PT Problem List. PTA, she was most recently at a SNF needing assistance for basic transfers to w/c and ADLs and not yet ambulating. Prior to her femur fx she was ambulatory. Currently, pt is limited in functional mobility safety and independence and at high risk for falls due to her pain, decreased balance, decreased generalized strength, impaired coordination, and deficits in activity tolerance. Pt is needing up to modA for bed mobility and maxA for transfers, with her unable to fully extend her hips in standing or weight shift to take steps this date. Pt's son really desires for the pt to come home after getting therapy at the SNF to maximize her independence and safety with all functional mobility to increase quality of life and decrease burden of care. Recommend short-term therapy at SNF prior to return home with son. Will continue to follow acutely.    Follow Up Recommendations SNF;Supervision/Assistance - 24 hour    Equipment Recommendations  Other (comment) (defer to next venue of care)     Recommendations for Other Services       Precautions / Restrictions Precautions Precautions: Fall Precaution Comments: WBAT with R leg, no restrictions with mobility; HOH (per secure chat with ortho MD) Restrictions Weight Bearing Restrictions: Yes RLE Weight Bearing: Weight bearing as tolerated Other Position/Activity Restrictions: No restrictions in mobility      Mobility  Bed Mobility Overal bed mobility: Needs Assistance Bed Mobility: Supine to Sit     Supine to sit: Mod assist;HOB elevated     General bed mobility comments: Pt needing extra time and modA to manage legs off R EOB and hand-over-hand for placement on R rail to assist with trunk management.    Transfers Overall transfer level: Needs assistance Equipment used: Rolling walker (2 wheeled);1 person hand held assist Transfers: Sit to/from [employer] Sit to Stand: Max assist   Squat pivot transfers: Max assist     General transfer comment: Sit to stand 2x attempt with RW and bil knee block as pt tends to extend R knee, but unable to come to full upright stand but buttocks cleared bed, maxA. VCs to extend hips, min success. Progressed to squat pivot to L with bil knee block and pt hugging therapist, maxA.  Ambulation/Gait             General Gait Details: Unable to weight shift to take steps today  Stairs            Wheelchair Mobility    Modified Rankin (Stroke Patients Only)       Balance Overall balance assessment: Needs assistance Sitting-balance support: No upper extremity supported;Feet supported Sitting balance-Leahy Scale: Fair  Sitting balance - Comments: Static sitting EOB min guard.   Standing balance support: Bilateral upper extremity supported Standing balance-Leahy Scale: Poor Standing balance comment: Reliant on bil UE support and maxA.                             Pertinent Vitals/Pain Pain Assessment: Faces Faces Pain Scale: Hurts even  more Pain Location: generalized with mobility, likely abdomen and R leg Pain Descriptors / Indicators: Discomfort;Guarding;Grimacing Pain Intervention(s): Limited activity within patient's tolerance;Monitored during session;Repositioned    Home Living Family/patient expects to be discharged to:: Skilled nursing facility                 Additional Comments: Pt was having difficulty standing for >30 seconds in parallel bars and needed assistance. Pt using w/c at SNF. Son desires to bring pt home with him after SNF stay.    Prior Function Level of Independence: Needs assistance   Gait / Transfers Assistance Needed: per son, able to ambulate with RW independently in home prior to femur fx. Pt recently needing assistance to transfer only at SNF and has not ambulated yet.  ADL's / Homemaking Assistance Needed: son lays out breakfast and cooks dinner for pt, per prior entry        Hand Dominance        Extremity/Trunk Assessment   Upper Extremity Assessment Upper Extremity Assessment: Generalized weakness    Lower Extremity Assessment Lower Extremity Assessment: Generalized weakness (prior R femur fx, scar at R knee thus likely surgery; limited R knee flexion ROM)    Cervical / Trunk Assessment Cervical / Trunk Assessment: Kyphotic  Communication   Communication: HOH  Cognition Arousal/Alertness: Awake/alert Behavior During Therapy: Flat affect Overall Cognitive Status: History of cognitive impairments - at baseline                                 General Comments: Pt's son reporting new confusion since going to SNF, educated him on delirium precautions. Pt needing extra time to process and sequence tasks but appropriate. Pt with flat affect and moments of tears due to bouts of attempted emesis.      General Comments General comments (skin integrity, edema, etc.): Son desires pt to eventually return home after SNF; pt with bouts of attempted emesis during  session but no success, RN aware; VSS    Exercises     Assessment/Plan    PT Assessment Patient needs continued PT services  PT Problem List Decreased strength;Decreased range of motion;Decreased activity tolerance;Decreased balance;Decreased mobility;Decreased coordination;Decreased cognition;Decreased knowledge of use of DME;Decreased knowledge of precautions;Decreased safety awareness       PT Treatment Interventions DME instruction;Gait training;Functional mobility training;Therapeutic activities;Therapeutic exercise;Balance training;Neuromuscular re-education;Cognitive remediation;Patient/family education    PT Goals (Current goals can be found in the Care Plan section)  Acute Rehab PT Goals Patient Stated Goal: to eventually go home with son, per son PT Goal Formulation: With patient/family Time For Goal Achievement: 07/18/20 Potential to Achieve Goals: Fair    Frequency Min 2X/week   Barriers to discharge        Co-evaluation               AM-PAC PT "6 Clicks" Mobility  Outcome Measure Help needed turning from your back to your side while in a flat bed without using bedrails?: A Lot Help needed moving from lying on your  back to sitting on the side of a flat bed without using bedrails?: A Lot Help needed moving to and from a bed to a chair (including a wheelchair)?: A Lot Help needed standing up from a chair using your arms (e.g., wheelchair or bedside chair)?: A Lot Help needed to walk in hospital room?: Total Help needed climbing 3-5 steps with a railing? : Total 6 Click Score: 10    End of Session Equipment Utilized During Treatment: Gait belt Activity Tolerance: Patient tolerated treatment well Patient left: in chair;with call bell/phone within reach;with chair alarm set;with family/visitor present Nurse Communication: Mobility status;Other (comment) (emesis attempts) PT Visit Diagnosis: Unsteadiness on feet (R26.81);Other abnormalities of gait and mobility  (R26.89);Muscle weakness (generalized) (M62.81);Difficulty in walking, not elsewhere classified (R26.2)    Time: 9292-4462 PT Time Calculation (min) (ACUTE ONLY): 50 min   Charges:   PT Evaluation $PT Eval Moderate Complexity: 1 Mod PT Treatments $Therapeutic Activity: 23-37 mins        Raymond Gurney, PT, DPT Acute Rehabilitation Services  Pager: [phone] Office: [phone]   Jewel Baize 07/04/2020, 10:53 AM

## 2020-07-04 NOTE — TOC Transition Note (Signed)
Transition of Care [facility]) - CM/SW Discharge Note   Patient Details  Name: Denise [name] MRN: [id number] Date of Birth: 15-Apr-1923  Transition of Care [facility]) CM/SW Contact:  [name], [place] Phone Number: 07/04/2020, 2:25 PM   Clinical Narrative:     Patient will DC to: [facility] Anticipated DC date: 07/04/20 Family notified: Son, Fayrene Fearing  Transport by: Sharin Mons     Per MD patient ready for DC to [facility] . RN, patient, patient's family, and facility notified of DC. Discharge Summary and FL2 sent to facilityDC packet on chart. Ambulance transport requested for patient.    RN to call report to [phone]  CSW will sign off for now as social work intervention is no longer needed. Please consult [place] again if new needs arise.   Final next level of care: Skilled Nursing Facility Barriers to Discharge: No Barriers Identified   Patient Goals and CMS Choice Patient states their goals for this hospitalization and ongoing recovery are:: To get well enough to stand and pivot and return home CMS Medicare.gov Compare Post Acute Care list provided to:: Patient Represenative (must comment) Choice offered to / list presented to : Adult Children  Discharge Placement              Patient chooses bed at: Other - please specify in the comment section below: ([facility] [place]) Patient to be transferred to facility by: [facility] Name of family member notified: Son Fayrene Fearing Patient and family notified of of transfer: 07/04/20  Discharge Plan and Services                                     Social Determinants of Health (SDOH) Interventions     Readmission Risk Interventions No flowsheet data found.  [name], [name], [place] Clinical Social Worker [phone]

## 2020-07-04 NOTE — Discharge Summary (Signed)
Physician Discharge Summary  Denise [name] WUJ:[id number] DOB: 12-May-1923 DOA: 07/02/2020  PCP: Daryl Eastern, MD  Admit date: 07/02/2020 Discharge date: 07/04/2020  Admitted From: SNF Discharge disposition: Back to SNF   Code Status: DNR  Diet Recommendation: Cardiac/diabetic diet  Discharge Diagnosis:   Principal Problem:   Ascites Active Problems:   Atrial fibrillation (HCC)   DM (diabetes mellitus) (HCC)   HTN (hypertension)   Pacemaker   Nonischemic cardiomyopathy (HCC)   Chronic combined systolic and diastolic CHF, NYHA class 2 (HCC)   Pressure injury of skin   Other cirrhosis of liver [facility])   Chief Complaint  Patient presents with  . Distended abdomen.   Brief narrative: Denise [name] is a 87 y.o. female with PMH significant for chronic A. fib, nonischemic cardiomyopathy, complete heart block status post pacemaker placement, pulmonary hypertension, chronic anemia who was recently admitted last month for right femur fracture and was discharged to skilled nursing facility had a follow-up with orthopedic surgeon on the day of admission 4/19.   She was noted to have progressively worsening abdominal distention and shortness of breath since the surgery and hence sent to the ED.  During her previous stay last month, patient was found to have ascites and underwent paracentesis.   In the ED, patient was hemodynamically stable. Chest x-ray with small left effusion Abdominal x-ray without bowel obstruction or free air Ultrasound abdomen with liver cirrhosis with significant reaccumulation of ascites CT abdomen pelvis showed hepatic steatosis with large volume ascites. Admitted to hospitalist service.  Subjective: Patient was seen and examined this morning. Pleasant elderly female.  Opens eyes and follows command.  Not in distress.  Son at bedside.  Patient was 6.1 L of ascites fluid tapped yesterday.  Per son, postprocedure patient was nauseated and threw up  couple of times.  Patient tolerated her breakfast fine this morning  Assessment/Plan: Recurrent ascites Liver cirrhosis -Etiology of liver cirrhosis unclear - CT abdomen shows finding of hepatic steatosis.  Echo from last month showed severe right volume overload, severe elevated pulmonary artery pressure and severe TR.  I suspect patient might have liver cirrhosis related to hepatic steatosis as well as congestive hepatopathy from right ventricular overload. -In any case, patient's son Mr. Fayrene Fearing does not want any further invasive work-up for her.  He is however agreeable to paracentesis periodically if needed. -Patient underwent paracentesis and removal of 6.1 L of fluid yesterday 4/20. -I inquired about setting up outpatient visits with IR for paracentesis.  According to IR, once patient returns back to the SNF, SNF MD will have to order outpatient paracentesis on regular intervals.  Patient's son states that it will be easier for him to follow-up at [facility].  Type 2 diabetes mellitus -Last A1c was 5.9 on 3/10.  Chronic A. Fib History of complete heart block  -status post pacemaker placement. -on apixaban.  Resume  Hyperlipidemia  -on statin  H/o nonischemic cardiomyopathy and pulmonary hypertension -Lasix.  -Last echo from last month showed EF of 50 to 55%, severe akinesis of the left ventricular, basal inferoseptal wall, right ventricular volume overload and severely elevated pulmonary artery systolic pressure, severe TR.  Chronic anemia -Hemoglobin stable Recent Labs    05/23/20 1355 05/24/20 0215 05/30/20 0219 06/01/20 0017 06/02/20 0115 07/02/20 1652 07/03/20 0611  HGB 8.0*   < > 8.6* 9.2* 8.7* 10.6* 10.0*  MCV 83.6   < > 89.0 88.6 89.4 97.8 97.7  VITAMINB12 598  --   --   --   --   --   --  FOLATE 12.7  --   --   --   --   --   --   FERRITIN 189  --   --   --   --   --   --   TIBC 160*  --   --   --   --   --   --   IRON 16*  --   --   --   --   --   --    RETICCTPCT 1.7  --   --   --   --   --   --    < > = values in this interval not displayed.   History of dementia  -Currently not on medications.   Wound care: Incision (Closed) 05/24/20 Thigh Right (Active)  Date First Assessed/Time First Assessed: 05/24/20 0959   Location: Thigh  Location Orientation: Right    Assessments 05/24/2020 10:53 AM 06/03/2020  9:30 AM  Dressing Type -- None  Dressing Clean;Dry;Intact --  Site / Wound Assessment Dressing in place / Unable to assess Clean;Dry  Margins -- Attached edges (approximated)  Closure -- Sutures  Drainage Amount None None  Treatment -- Ice applied     No Linked orders to display     Incision (Closed) 05/24/20 Sacrum Right (Active)  Date First Assessed/Time First Assessed: 05/24/20 1013   Location: (c) Sacrum  Location Orientation: Right  Present on Admission: Yes    Assessments 05/24/2020 10:53 AM 06/02/2020  9:00 PM  Dressing Type Foam - Lift dressing to assess site every shift Foam - Lift dressing to assess site every shift  Dressing Clean;Dry;Intact Clean;Dry;Intact  Site / Wound Assessment Dressing in place / Unable to assess Dressing in place / Unable to assess  Drainage Amount None None  Treatment -- Cleansed;Off loading     No Linked orders to display     Wound / Incision (Open or Dehisced) 06/03/20 (MASD) Moisture Associated Skin Damage Vagina Right (Active)  Date First Assessed/Time First Assessed: 06/03/20 1148   Wound Type: (MASD) Moisture Associated Skin Damage  Location: Vagina  Location Orientation: Right  Present on Admission: Yes    Assessments 06/03/2020  9:30 AM  Site / Wound Assessment Red;Painful;Pink  Margins Attached edges (approximated)  Drainage Amount None  Treatment Cleansed     No Linked orders to display     Pressure Injury 07/03/20 Ankle Posterior;Right (Active)  Date First Assessed/Time First Assessed: 07/03/20 0240   Location: Ankle  Location Orientation: Posterior;Right    Assessments  07/03/2020  2:40 AM 07/03/2020  8:00 AM  Dressing Type Foam - Lift dressing to assess site every shift Foam - Lift dressing to assess site every shift  Dressing Other (Comment) --  Site / Wound Assessment Painful;Black;Dry Black  Peri-wound Assessment Intact Intact     No Linked orders to display     Pressure Injury 07/03/20 Coccyx Lower;Mid Stage 1 -  Intact skin with non-blanchable redness of a localized area usually over a bony prominence. Stage 1 Pressure Injury Coccyx area (Active)  Date First Assessed/Time First Assessed: 07/03/20 0800   Location: Coccyx  Location Orientation: Lower;Mid  Staging: Stage 1 -  Intact skin with non-blanchable redness of a localized area usually over a bony prominence.  Wound Description (Comments): ...    Assessments 07/03/2020  8:00 AM  Dressing Type Foam - Lift dressing to assess site every shift  Dressing Clean;Dry;Intact  Dressing Change Frequency Daily  Site / Wound Assessment Red;Pink;Clean;Dry  No Linked orders to display    Discharge Exam:   Vitals:   07/03/20 1800 07/03/20 2007 07/04/20 0036 07/04/20 0651  BP: (!) 133/49 138/66 120/61 133/64  Pulse: 70 71 70 70  Resp: 17 16 17 17   Temp: 99 F (37.2 C) 98.1 F (36.7 C) 98 F (36.7 C) 98 F (36.7 C)  TempSrc: Oral  Oral   SpO2: 96% 99% 98% 98%  Weight:        Body mass index is 31.39 kg/m.  General exam: Pleasant, not in physical distress Skin: No rashes, lesions or ulcers. HEENT: Atraumatic, normocephalic, no obvious bleeding Lungs: Clear to auscultation bilaterally CVS: Regular rate and rhythm, no murmur GI/Abd soft, nontender, abdominal distention improved after paracentesis, bowel sound present CNS: Opens eyes on verbal command, knows she is in the hospital Psychiatry: Mood appropriate Extremities: No pedal edema, no calf tenderness  Follow ups:   Discharge Instructions    Diet - low sodium heart healthy   Complete by: As directed    Diet Carb Modified   Complete by:  As directed    Discharge wound care:   Complete by: As directed    Dry dressing at the site of pressure injury.  Offloading   Increase activity slowly   Complete by: As directed       Follow-up Information    , MD Follow up.   Specialty: Family Medicine Contact information: [address] [place] [place] [address]        [phone], MD .   Specialty: Cardiology Contact information: [address] [place] [phone]        Hospital, [phone] Follow up.   Contact information: Radiology Dept [address] [place] [phone]               Recommendations for Outpatient Follow-Up:   1. Follow-up with IR as an outpatient for paracentesis  Discharge Instructions:  Follow with Primary MD [phone], MD in 7 days   Get CBC/BMP checked in next visit within 1 week by PCP or SNF MD ( we routinely change or add medications that can affect your baseline labs and fluid status, therefore we recommend that you get the mentioned basic workup next visit with your PCP, your PCP may decide not to get them or add new tests based on their clinical decision)  On your next visit with your PCP, please Get Medicines reviewed and adjusted.  Please request your PCP  to go over all Hospital Tests and Procedure/Radiological results at the follow up, please get all Hospital records sent to your Prim MD by signing hospital release before you go home.  Activity: As tolerated with Full fall precautions use walker/cane & assistance as needed  For Heart failure patients - Check your Weight same time everyday, if you gain over 2 pounds, or you develop in leg swelling, experience more shortness of breath or chest pain, call your Primary MD immediately. Follow Cardiac Low Salt Diet and 1.5 lit/day fluid restriction.  If you have smoked or chewed Tobacco in the last 2 yrs please stop smoking, stop  any regular Alcohol  and or any Recreational drug use.  If you experience worsening of your admission symptoms, develop shortness of breath, life threatening emergency, suicidal or homicidal thoughts you must seek medical attention immediately by calling 911 or calling your MD immediately  if symptoms less severe.  You Must read complete  instructions/literature along with all the possible adverse reactions/side effects for all the Medicines you take and that have been prescribed to you. Take any new Medicines after you have completely understood and accpet all the possible adverse reactions/side effects.   Do not drive, operate heavy machinery, perform activities at heights, swimming or participation in water activities or provide baby sitting services if your were admitted for syncope or siezures until you have seen by Primary MD or a Neurologist and advised to do so again.  Do not drive when taking Pain medications.  Do not take more than prescribed Pain, Sleep and Anxiety Medications  Wear Seat belts while driving.   Please note You were cared for by a hospitalist during your hospital stay. If you have any questions about your discharge medications or the care you received while you were in the hospital after you are discharged, you can call the unit and asked to speak with the hospitalist on call if the hospitalist that took care of you is not available. Once you are discharged, your primary care physician will handle any further medical issues. Please note that NO REFILLS for any discharge medications will be authorized once you are discharged, as it is imperative that you return to your primary care physician (or establish a relationship with a primary care physician if you do not have one) for your aftercare needs so that they can reassess your need for medications and monitor your lab values.    Allergies as of 07/04/2020      Reactions   Iohexol Rash, Other (See Comments)    Desc:  VOMITING-VERIFIED ON 05-24-04 PRE-PROCEDURE/ ARS   Shrimp [shellfish Allergy] Nausea And Vomiting, Rash      Medication List    TAKE these medications   acetaminophen 325 MG tablet Commonly known as: TYLENOL Take 650 mg by mouth every 12 (twelve) hours as needed for mild pain. X 14 days What changed: Another medication with the same name was changed. Make sure you understand how and when to take each.   acetaminophen 325 MG tablet Commonly known as: TYLENOL Take 2 tablets (650 mg total) by mouth every 6 (six) hours as needed for mild pain, fever or headache. What changed: when to take this   atorvastatin 10 MG tablet Commonly known as: LIPITOR TAKE ONE TABLET BY MOUTH EVERY EVENING What changed:   how much to take  how to take this  when to take this  additional instructions   bisacodyl 10 MG suppository Commonly known as: DULCOLAX Place 10 mg rectally as needed for mild constipation. If no BM  from MOM   Eliquis 5 MG Tabs tablet Generic drug: apixaban Take 5 mg by mouth 2 (two) times daily.   furosemide 40 MG tablet Commonly known as: LASIX Take 1-2 tablets (40-80 mg total) by mouth See admin instructions. Take 1 tablet (40mg ) by mouth daily except on Tuesday and Friday take 1 tablet (40 mg totally) 2 times daily What changed:   how much to take  additional instructions   magnesium hydroxide 400 MG/5ML suspension Commonly known as: MILK OF MAGNESIA Take 30 mLs by mouth as needed for mild constipation. If no BM in 3 days   methimazole 10 MG tablet Commonly known as: TAPAZOLE TAKE 1/2 TABLET BY MOUTH ONCE DAILY   ondansetron 4 MG tablet Commonly known as: Zofran Take 1 tablet (4 mg total) by mouth every 8 (eight) hours as needed for nausea or vomiting.   oxyCODONE 5 MG  immediate release tablet Commonly known as: Oxy IR/ROXICODONE Take 2.5 mg by mouth See admin instructions. Every morning as needed for pain x 14 days   polyethylene glycol 17 g  packet Commonly known as: MIRALAX / GLYCOLAX Take 17 g by mouth daily.   potassium chloride SA 20 MEQ tablet Commonly known as: KLOR-CON Take 40 mEq daily except on Tuesday and Friday take 60 mEg (3 tablets) What changed:   how much to take  how to take this  when to take this  additional instructions   PROSTAT PO Take 30 mLs by mouth in the morning and at bedtime.   sodium phosphate Pediatric 3.5-9.5 GM/59ML enema Place 1 enema rectally as needed for severe constipation. If no result from bisacodyl suppository            Discharge Care Instructions  (From admission, onward)         Start     Ordered   07/04/20 0000  Discharge wound care:       Comments: Dry dressing at the site of pressure injury.  Offloading   07/04/20 1033          Time coordinating discharge: 35 minutes  The results of significant diagnostics from this hospitalization (including imaging, microbiology, ancillary and laboratory) are listed below for reference.    Procedures and Diagnostic Studies:   DG Abdomen 1 View  Result Date: 07/02/2020 CLINICAL DATA:  Vomiting, abdominal distention EXAM: ABDOMEN - 1 VIEW COMPARISON:  05/28/2020.  CT 05/30/2020 FINDINGS: Nonobstructive bowel gas pattern. Prior cholecystectomy. No organomegaly or suspicious calcification. No evidence of free air. IMPRESSION: No bowel obstruction or free air. Electronically Signed   By: Charlett Nose M.D.   On: 07/02/2020 19:35   DG Chest Port 1 View  Result Date: 07/02/2020 CLINICAL DATA:  Shortness of breath, abdominal distention EXAM: PORTABLE CHEST 1 VIEW COMPARISON:  05/26/2020 FINDINGS: Left pacer remains in place, unchanged. Cardiomegaly. Small left pleural effusion with left base atelectasis. No confluent opacity or effusion on the right. Mild vascular congestion. IMPRESSION: Cardiomegaly, vascular congestion. Small left effusion with left base atelectasis. Electronically Signed   By: Charlett Nose M.D.   On:  07/02/2020 19:34   CT RENAL STONE STUDY  Result Date: 07/03/2020 CLINICAL DATA:  Flank pain and abdominal distension EXAM: CT ABDOMEN AND PELVIS WITHOUT CONTRAST TECHNIQUE: Multidetector CT imaging of the abdomen and pelvis was performed following the standard protocol without IV contrast. COMPARISON:  05/30/2020. FINDINGS: LOWER CHEST: Marked cardiomegaly. Small pleural effusions with bibasilar atelectasis. HEPATOBILIARY: Hepatic steatosis with large volume abdominopelvic ascites. Gallbladder is absent. PANCREAS: Normal pancreas. No ductal dilatation or peripancreatic fluid collection. SPLEEN: Normal. ADRENALS/URINARY TRACT: The adrenal glands are normal. Bilateral renal atrophy. The urinary bladder is normal for degree of distention STOMACH/BOWEL: There is no hiatal hernia. Normal duodenal course and caliber. No small bowel dilatation or inflammation. Rectosigmoid diverticulosis without acute inflammation. VASCULAR/LYMPHATIC: Normal course and caliber of the major abdominal vessels. No abdominal or pelvic lymphadenopathy. REPRODUCTIVE: Status post hysterectomy. No adnexal mass. MUSCULOSKELETAL. Grade 1 anterolisthesis at L4-5 OTHER: None. IMPRESSION: 1. Hepatic steatosis with large volume abdominopelvic ascites. 2. Small pleural effusions with bibasilar atelectasis. 3. Marked cardiomegaly. 4. Rectosigmoid diverticulosis without acute inflammation. Electronically Signed   By: Deatra Robinson M.D.   On: 07/03/2020 02:10   US Abdomen Limited RUQ (LIVER/GB)  Result Date: 07/02/2020 CLINICAL DATA:  Abdominal distension EXAM: ULTRASOUND ABDOMEN LIMITED RIGHT UPPER QUADRANT COMPARISON:  05/30/2020 FINDINGS: Gallbladder: Surgically removed Common bile duct:  Diameter: 10.8 mm consistent with the post cholecystectomy state. Liver: Diffusely increased in echogenicity consistent with a degree of underlying cirrhosis given the ascites. Portal vein shows bidirectional flow consistent with the underlying hepatic change.  Other: Significant ascites is noted similar to that seen on the prior exam. Large pocket is noted in the left lower quadrant. IMPRESSION: Cirrhosis of the liver with significant ascites re-accumulated when compared with the prior exam following paracentesis. Status post cholecystectomy. Electronically Signed   By: Alcide Clever M.D.   On: 07/02/2020 23:25   IR Paracentesis  Result Date: 07/03/2020 INDICATION: Recurring ascites of unknown etiology. Request for diagnostic and therapeutic paracentesis. EXAM: ULTRASOUND GUIDED PARACENTESIS MEDICATIONS: 1% lidocaine 10 mL COMPLICATIONS: None immediate. PROCEDURE: Informed written consent was obtained from the patient after a discussion of the risks, benefits and alternatives to treatment. A timeout was performed prior to the initiation of the procedure. Initial ultrasound scanning demonstrates a large amount of ascites within the left lower abdominal quadrant. The right lower abdomen was prepped and draped in the usual sterile fashion. 1% lidocaine was used for local anesthesia. Following this, a 19 gauge, 7-cm, Yueh catheter was introduced. An ultrasound image was saved for documentation purposes. The paracentesis was performed. The catheter was removed and a dressing was applied. The patient tolerated the procedure well without immediate post procedural complication. FINDINGS: A total of approximately 6.1 L of clear yellow fluid was removed. Samples were sent to the laboratory as requested by the clinical team. IMPRESSION: Successful ultrasound-guided paracentesis yielding 6.1 liters of peritoneal fluid. Read by: Corrin Parker, PA-C Electronically Signed   By: Acquanetta Belling M.D.   On: 07/03/2020 13:42     Labs:   Basic Metabolic Panel: Recent Labs  Lab 07/02/20 1652 07/03/20 0611  NA 134* 136  K 5.1 5.0  CL 101 105  CO2 26 25  GLUCOSE 120* 90  BUN 31* 33*  CREATININE 0.99 0.97  CALCIUM 8.1* 8.1*   GFR Estimated Creatinine Clearance: 31.6 mL/min (by  C-G formula based on SCr of 0.97 mg/dL). Liver Function Tests: Recent Labs  Lab 07/02/20 1652  AST 27  ALT 12  ALKPHOS 306*  BILITOT 0.7  PROT 6.9  ALBUMIN 1.8*   Recent Labs  Lab 07/02/20 1652  LIPASE 36   No results for input(s): AMMONIA in the last 168 hours. Coagulation profile No results for input(s): INR, PROTIME in the last 168 hours.  CBC: Recent Labs  Lab 07/02/20 1652 07/03/20 0611  WBC 8.9 8.0  NEUTROABS 6.2  --   HGB 10.6* 10.0*  HCT 34.8* 33.5*  MCV 97.8 97.7  PLT 387 372   Cardiac Enzymes: No results for input(s): CKTOTAL, CKMB, CKMBINDEX, TROPONINI in the last 168 hours. BNP: Invalid input(s): POCBNP CBG: No results for input(s): GLUCAP in the last 168 hours. D-Dimer No results for input(s): DDIMER in the last 72 hours. Hgb A1c No results for input(s): HGBA1C in the last 72 hours. Lipid Profile No results for input(s): CHOL, HDL, LDLCALC, TRIG, CHOLHDL, LDLDIRECT in the last 72 hours. Thyroid function studies No results for input(s): TSH, T4TOTAL, T3FREE, THYROIDAB in the last 72 hours.  Invalid input(s): FREET3 Anemia work up No results for input(s): VITAMINB12, FOLATE, FERRITIN, TIBC, IRON, RETICCTPCT in the last 72 hours. Microbiology Recent Results (from the past 240 hour(s))  Resp Panel by RT-PCR (Flu A&B, Covid) Nasopharyngeal Swab     Status: None   Collection Time: 07/02/20  6:16 PM   Specimen: Nasopharyngeal Swab;  Nasopharyngeal(NP) swabs in vial transport medium  Result Value Ref Range Status   SARS Coronavirus 2 by RT PCR NEGATIVE NEGATIVE Final    Comment: (NOTE) SARS-CoV-2 target nucleic acids are NOT DETECTED.  The SARS-CoV-2 RNA is generally detectable in upper respiratory specimens during the acute phase of infection. The lowest concentration of SARS-CoV-2 viral copies this assay can detect is 138 copies/mL. A negative result does not preclude SARS-Cov-2 infection and should not be used as the sole basis for treatment  or other patient management decisions. A negative result may occur with  improper specimen collection/handling, submission of specimen other than nasopharyngeal swab, presence of viral mutation(s) within the areas targeted by this assay, and inadequate number of viral copies(<138 copies/mL). A negative result must be combined with clinical observations, patient history, and epidemiological information. The expected result is Negative.  Fact Sheet for Patients:  BloggerCourse.com  Fact Sheet for Healthcare Providers:  SeriousBroker.it  This test is no t yet approved or cleared by the [place] FDA and  has been authorized for detection and/or diagnosis of SARS-CoV-2 by FDA under an Emergency Use Authorization (EUA). This EUA will remain  in effect (meaning this test can be used) for the duration of the COVID-19 declaration under Section 564(b)(1) of the Act, 21 U.S.C.section 360bbb-3(b)(1), unless the authorization is terminated  or revoked sooner.       Influenza A by PCR NEGATIVE NEGATIVE Final   Influenza B by PCR NEGATIVE NEGATIVE Final    Comment: (NOTE) The Xpert Xpress SARS-CoV-2/FLU/RSV plus assay is intended as an aid in the diagnosis of influenza from Nasopharyngeal swab specimens and should not be used as a sole basis for treatment. Nasal washings and aspirates are unacceptable for Xpert Xpress SARS-CoV-2/FLU/RSV testing.  Fact Sheet for Patients: BloggerCourse.com  Fact Sheet for Healthcare Providers: SeriousBroker.it  This test is not yet approved or cleared by the [place] FDA and has been authorized for detection and/or diagnosis of SARS-CoV-2 by FDA under an Emergency Use Authorization (EUA). This EUA will remain in effect (meaning this test can be used) for the duration of the COVID-19 declaration under Section 564(b)(1) of the Act, 21 U.S.C. section  360bbb-3(b)(1), unless the authorization is terminated or revoked.  Performed at [facility], [address] [place], [place] [address]   Gram stain     Status: None   Collection Time: 07/03/20 11:31 AM   Specimen: Abdomen; Peritoneal Fluid  Result Value Ref Range Status   Specimen Description ABDOMEN  Final   Special Requests NONE  Final   Gram Stain   Final    WBC PRESENT,BOTH PMN AND MONONUCLEAR NO ORGANISMS SEEN CYTOSPIN SMEAR Performed at [facility], [address], [place], [place] [address]    Report Status 07/03/2020 FINAL  Final     Signed: Melina Schools [facility]  [facility] 07/04/2020, 1:09 PM

## 2020-07-04 NOTE — Social Work (Addendum)
PTs son Denise [name] requested a call from [employer] department to determine out of pocket expense for transportation. CSW called and PTAR will call him directly.   Denise [name] stated that if it cost too much he will transport her himself. CSW reached out to PT and car transportation is not advised. CSW relayed message to [place]. Denise [name] stated that he feels confident he can safely transport her even thogh it is not advised.   Denise [name] stated that [facility] will send someone to pick up pt and requested CSW to cancel ptar.   [name], [name], [place] Clinical Social Worker [phone]

## 2020-07-04 NOTE — [place] FL2 (Signed)
[place] MEDICAID FL2 LEVEL OF CARE SCREENING TOOL     IDENTIFICATION  Patient Name: Denise [name] Birthdate: [birth year] Sex: female Admission Date (Current Location): 07/02/2020  [facility] and [place] Number:  Producer, television/film/video and Address:  The . [facility], [address] [place], [place] [address]      Provider Number: [id number]  Attending Physician Name and Address:  Lorin Glass, MD  Relative Name and Phone Number:       Current Level of Care: Hospital Recommended Level of Care: Skilled Nursing Facility Prior Approval Number:    Date Approved/Denied:   PASRR Number: [id number]  Discharge Plan: SNF    Current Diagnoses: Patient Active Problem List   Diagnosis Date Noted  . Pressure injury of skin 07/04/2020  . Other cirrhosis of liver (HCC) 07/04/2020  . Ascites 07/02/2020  . Closed fracture of right femur (HCC) 05/23/2020  . Chronic respiratory failure with hypoxia (HCC) 05/23/2020  . Anemia due to chronic kidney disease 05/23/2020  . Femur fracture, right (HCC) 05/23/2020  . Chronic kidney disease, stage 3a (HCC) 06/21/2018  . Volume overload 06/18/2018  . Thyroid disease 06/17/2018  . Acute on chronic combined systolic (congestive) and diastolic (congestive) heart failure (HCC) 06/17/2018  . Elevated troponin 06/17/2018  . Abdominal distention 06/17/2018  . Pacemaker battery depletion 08/15/2017  . Chronic anticoagulation - coumadin, CHADS2VASC=5 06/07/2015  . Chronic combined systolic and diastolic CHF, NYHA class 2 (HCC) 06/07/2015  . Heme positive stool 06/07/2015  . GIB (gastrointestinal bleeding) 06/07/2015  . Hyperlipidemia 04/11/2015  . Acute on chronic combined systolic and diastolic CHF (congestive heart failure) (HCC) 01/08/2015  . Nonischemic cardiomyopathy (HCC) 10/20/2012  . CHB (complete heart block) (HCC) 10/20/2012  . Pacemaker 09/06/2012  . Fracture of right hip (HCC) 06/05/2012  . Fracture of right humerus  06/05/2012  . Atrial fibrillation (HCC) 06/05/2012  . DM (diabetes mellitus) (HCC) 06/05/2012  . HTN (hypertension) 06/05/2012  . Hypertension 06/05/2012  . Warfarin-induced coagulopathy (HCC) 06/05/2012    Orientation RESPIRATION BLADDER Height & Weight     Self,Time,Situation,Place  Normal Incontinent Weight: 160 lb 11.5 oz (72.9 kg) Height:     BEHAVIORAL SYMPTOMS/MOOD NEUROLOGICAL BOWEL NUTRITION STATUS      Incontinent Diet (See DC summary)  AMBULATORY STATUS COMMUNICATION OF NEEDS Skin   Extensive Assist Verbally PU Stage and Appropriate Care PU Stage 1 Dressing: BID (Coccyx and ankle)                     Personal Care Assistance Level of Assistance  Bathing,Feeding,Dressing Bathing Assistance: Maximum assistance Feeding assistance: Maximum assistance Dressing Assistance: Maximum assistance     Functional Limitations Info  Sight,Hearing,Speech Sight Info: Adequate Hearing Info: Impaired (HOH) Speech Info: Adequate    SPECIAL CARE FACTORS FREQUENCY  PT (By licensed PT),OT (By licensed OT)     PT Frequency: 5x a week OT Frequency: 5x a week            Contractures Contractures Info: Not present    Additional Factors Info  Code Status,Allergies Code Status Info: DNR Allergies Info: Iohexol   Shrimp           Current Medications (07/04/2020):  This is the current hospital active medication list Current Facility-Administered Medications  Medication Dose Route Frequency Provider Last Rate Last Admin  . apixaban (ELIQUIS) tablet 5 mg  5 mg Oral BID Dahal, Melina Schools, MD   5 mg at 07/04/20 1100  . atorvastatin (LIPITOR) tablet  10 mg  10 mg Oral QHS Eduard Clos, MD   10 mg at 07/03/20 2142  . bisacodyl (DULCOLAX) suppository 10 mg  10 mg Rectal Daily PRN Eduard Clos, MD      . cefTRIAXone (ROCEPHIN) 2 g in sodium chloride 0.9 % 100 mL IVPB  2 g Intravenous Q0600 Eduard Clos, MD 200 mL/hr at 07/04/20 0602 2 g at 07/04/20 0602  .  [START ON 07/05/2020] furosemide (LASIX) tablet 40 mg  40 mg Oral 2 times per day on Tue Fri Kakrakandy, Arshad N, MD      . furosemide (LASIX) tablet 40 mg  40 mg Oral Once per day on Sun Mon Wed Thu Sat Eduard Clos, MD   40 mg at 07/04/20 1100  . lidocaine (PF) (XYLOCAINE) 1 % injection   Infiltration PRN Gershon Crane, PA-C   10 mL at 07/03/20 1120  . magnesium hydroxide (MILK OF MAGNESIA) suspension 30 mL  30 mL Oral Daily PRN Eduard Clos, MD      . methimazole (TAPAZOLE) tablet 5 mg  5 mg Oral Daily Eduard Clos, MD   5 mg at 07/04/20 1100  . ondansetron (ZOFRAN) injection 4 mg  4 mg Intravenous Q6H PRN Lorin Glass, MD   4 mg at 07/04/20 1122  . polyethylene glycol (MIRALAX / GLYCOLAX) packet 17 g  17 g Oral Daily Eduard Clos, MD   17 g at 07/04/20 1100  . potassium chloride SA (KLOR-CON) CR tablet 40 mEq  40 mEq Oral Once per day on Sun Mon Wed Thu Sat Eduard Clos, MD   40 mEq at 07/04/20 1100  . [START ON 07/05/2020] potassium chloride SA (KLOR-CON) CR tablet 60 mEq  60 mEq Oral Once per day on Tue Fri Eduard Clos, MD         Discharge Medications: Please see discharge summary for a list of discharge medications.  Relevant Imaging Results:  Relevant Lab Results:   Additional Information SS#: [id number], covid vaccinated with booster  [name], LCSWA

## 2020-07-08 ENCOUNTER — Telehealth: Payer: Self-pay | Admitting: Cardiovascular Disease

## 2020-07-08 NOTE — Telephone Encounter (Signed)
Called to say that patient gain 3.2lbs since years. The nurse there has put orders in and if Dr. Tresa Endo would like more please advise.  5ml IM  1 time dose Chest xray with 2 view KUB Labs: cbc bmp and amnonia

## 2020-07-08 NOTE — Telephone Encounter (Signed)
Spoke with nurse from [facility] who report pt gained 3.2 lbs overnight.  She report the facility NP has ordered a chest xray, KUB, CBC, BMP, and ammonia. She also report pt was given 40 mg of lasix IM.  Facility wanted to make MD aware of plan.

## 2020-07-09 ENCOUNTER — Telehealth: Payer: Self-pay | Admitting: Cardiovascular Disease

## 2020-07-09 NOTE — Telephone Encounter (Signed)
Spoke to patient's son.  Informed Denise [name]  Will not be able to assist patient in remaining in the nursing /rehab facility.  RN  ( spoke to [facility] Child psychotherapist for recommendations) recommend [employer] up meeting with patient's care team - to see what were  parameters for discharge and will equipment be set up.   Son states he will also called surgeon Dr Jena Gauss since patient was just released last week to [facility].  Denise [name] verbalized understanding.

## 2020-07-09 NOTE — Telephone Encounter (Signed)
Patient's son called and mentioned that patient is in nursing facility from hospital. Stated that nursing facility called and stated that patient has met expectations and said that someone needs to come get her this Friday. Want to see if Dr. Claiborne Billings can do anything

## 2020-07-09 NOTE — Telephone Encounter (Signed)
acknowledged

## 2020-07-11 ENCOUNTER — Telehealth: Payer: Self-pay | Admitting: Licensed Clinical Social Worker

## 2020-07-11 NOTE — Progress Notes (Signed)
Heart and Vascular Care Navigation  07/11/2020  Denise [name] 1923/07/12 [id number]  Reason for Referral:  Engaged with patient by telephone (pt son) for initial visit for Heart and Vascular Care Coordination.                                                                                                   Assessment:          LCSW spoke with pt son Denise [name] via telephone at [phone]. Introduced self, role, reason for call. Pt son confirmed that pt still at [facility] SNF. He shares that they filed for an appeal of her discharge from SNF. He is understandably frustrated as due to pt age/ongoing physical challenges her recovery has been slow. LCSW shared my understanding of coverage and he is aware that Medicare does not cover LTC/custodial care. He has been working with DSS to try and get pt Medicaid but that process has hit multiple delays. LCSW encouraged him to ensure that pt has HH arranged if they do plan on d/c pt. I also encouraged him to contact his sisters even though he shares they are "busy" as pt does not have coverage for 24/7 care at this time and therefore they may need to provide some care as a family/look into hiring private care.                                HRT/VAS Care Coordination    Patients Home Cardiology Office [facility]   Outpatient Care Team Social Worker   Social Worker Name: Denise [name], [phone]   Living arrangements for the past 2 months Single Family Home; Skilled Nursing Facility   Lives with: Self   Patient Current Insurance Coverage Traditional Medicare  w/ AARP supplement   Patient Has Concern With Paying Medical Bills No   Does Patient Have Prescription Coverage? Yes   Home Assistive Devices/Equipment Walker (specify type)   DME Agency NA   HH Agency Other - See comment  [facility].   Current home services DME      Social History:                                                                              SDOH Screenings   Alcohol Screen: Not on file  Depression (GUR4-2): Not on file  Financial Resource Strain: Not on file  Food Insecurity: Not on file  Housing: Not on file  Physical Activity: Not on file  Social Connections: Not on file  Stress: Not on file  Tobacco Use: Low Risk   . Smoking Tobacco Use: Never Smoker  . Smokeless Tobacco Use: Never Used  Transportation Needs: Not  on file    Follow-up plan:   Pt son has also alerted PCP office of current challenges and is looking for accessible vehicle to get her home if needed. I offered to f/u with them next week and pt son has my number for any additional questions/concerns.

## 2020-07-19 ENCOUNTER — Other Ambulatory Visit (HOSPITAL_COMMUNITY): Payer: Self-pay | Admitting: Internal Medicine

## 2020-07-19 DIAGNOSIS — R188 Other ascites: Secondary | ICD-10-CM

## 2020-07-22 ENCOUNTER — Telehealth: Payer: Self-pay | Admitting: Licensed Clinical Social Worker

## 2020-07-22 NOTE — Telephone Encounter (Signed)
LCSW attempted to reach out to pt son Fayrene Fearing to f/u on any pt needs. No answer, no voicemail at [phone].  Octavio Graves, MSW, LCSW [facility] Heart/Vascular Care Navigation  [phone]

## 2020-07-23 ENCOUNTER — Encounter: Payer: Self-pay | Admitting: Physician Assistant

## 2020-07-23 ENCOUNTER — Ambulatory Visit (INDEPENDENT_AMBULATORY_CARE_PROVIDER_SITE_OTHER): Payer: Medicare Other | Admitting: Physician Assistant

## 2020-07-23 ENCOUNTER — Other Ambulatory Visit: Payer: Self-pay

## 2020-07-23 VITALS — BP 133/68 | HR 70 | Ht 60.0 in | Wt 142.8 lb

## 2020-07-23 DIAGNOSIS — K7469 Other cirrhosis of liver: Secondary | ICD-10-CM

## 2020-07-23 DIAGNOSIS — N183 Chronic kidney disease, stage 3 unspecified: Secondary | ICD-10-CM

## 2020-07-23 DIAGNOSIS — I5042 Chronic combined systolic (congestive) and diastolic (congestive) heart failure: Secondary | ICD-10-CM

## 2020-07-23 DIAGNOSIS — I4821 Permanent atrial fibrillation: Secondary | ICD-10-CM

## 2020-07-23 DIAGNOSIS — R188 Other ascites: Secondary | ICD-10-CM | POA: Diagnosis not present

## 2020-07-23 DIAGNOSIS — Z950 Presence of cardiac pacemaker: Secondary | ICD-10-CM | POA: Diagnosis not present

## 2020-07-23 DIAGNOSIS — I071 Rheumatic tricuspid insufficiency: Secondary | ICD-10-CM

## 2020-07-23 DIAGNOSIS — E119 Type 2 diabetes mellitus without complications: Secondary | ICD-10-CM

## 2020-07-23 NOTE — Progress Notes (Signed)
Cardiology Office Note:    Date:  07/25/2020   ID:  Denise [name], DOB 08/03/1923, MRN [id number]  PCP:  Denise Eastern, MD   [facility] Providers Cardiologist:  Nicki Guadalajara, MD Electrophysiologist:  Denise Fair, MD {   Referring MD: Denise Eastern, MD   Chief Complaint  Patient presents with  . Follow-up    Seen for Dr. Tresa Endo    History of Present Illness:    Denise [name] is a 87 y.o. female with a hx of chronic combined systolic and diastolic heart failure, permanent atrial fibrillation s/p AV nodal ablation by Dr. Severiano [name], s/p [facility] PPM Jun 2006, hypertensive cardiomyopathy, moderate to severe MR, pulmonary hypertension, severe TR, DM2, CKD stage III, dementia and hypothyroidism.  Echocardiogram in December 2012 showed EF 45 to 50%, moderate to severe MR, moderate pulm hypertension.  Repeat echocardiogram in February 2019 showed EF 50 to 55%, mild MR, severe biatrial enlargement, moderate TR.  She underwent replacement of dual-chamber pacemaker in June 2019 by Dr. Royann [name], device was programmed VVIR due to underlying permanent atrial fibrillation.  She is pacemaker dependent.   More recently, patient has had multiple admissions.  She suffered a closed displaced fracture of right femur in early March 2022 and underwent surgery with retrograde intramedullary nailing of the right femur fracture by Dr. Jena [name] on 04/26/2020.  Eliquis was resumed after the surgery.  During the hospitalization, patient had recurrent nausea followed by vomiting requiring the placement of NG tube.  CT of the abdomen showed severe ascites.  Patient underwent paracentesis on 05/31/2020 with removal of 8 L of fluid.  She was treated with albumin infusion. Post paracentesis, she developed AKI. She also developed postop anemia with hemoglobin down to 8.  Echocardiogram obtained on 05/27/2020 showed EF 50 to 55%, flattening of interventricular septum consistent with right ventricular volume  overload, severe akinesis of the left ventricular, basal inferoseptal wall, severely elevated pulmonary artery systolic pressure, moderately dilated left atrium, severely dilated right atrium, severe TR.  Since discharge, patient was readmitted in late April 2020 due to worsening abdominal distention and recurrent ascites.  Abdominal x-ray shows no evidence of bowel obstruction or free air.  Ultrasound of abdomen revealed liver cirrhosis with significant reaccumulation of ascites.  This is also confirmed with CT of abdomen and pelvis.  She underwent a second paracentesis on 07/03/2020 with removal of 6.1 L over ascites fluid.  Postprocedure, patient had significant nausea and threw up multiple times..  Patient presents today for follow-up along with her son.  She currently resides at a skilled nursing facility.  Her abdomen is distended, she is having recurrent ascites and is scheduled for paracentesis tomorrow.  Her breathing is all right.  She does have trace amount of lower extremity edema, this is likely due to combination of ascites and severe TR.  Currently, she is taking 40 mg daily of furosemide with 80mg  on Tuesday and Friday, I wish to increase Lasix slightly.  I will increase her Lasix to 40 mg daily with exception of 80 mg daily every Tuesday, Thursday and Saturday.  Hopefully by keeping her on the dry side, she will has less frequent paracentesis in the future.  Unfortunately her long-term prognosis is poor given recurrent ascites and frailty.  She will need a basic metabolic panel in 2 weeks at her facility.    Past Medical History:  Diagnosis Date  . CHF (congestive heart failure) (HCC)   . Diabetes mellitus without complication (HCC)   .  Hypertension   . LVH (left ventricular hypertrophy)   . Mitral regurgitation    mod to severe  . Permanent atrial fibrillation (HCC)   . Presence of permanent cardiac pacemaker   . Pulmonary hypertension (HCC)    severe  . Thyroid disease   .  Tricuspid regurgitation    severe    Past Surgical History:  Procedure Laterality Date  .  INTRAMEDULLARY (IM) RETROGRADE FEMORAL NAILING (Right Leg Upper) Right 05/24/2020  . ABDOMINAL HYSTERECTOMY    . CARDIOVERSION  5 /18/ 2000   unsuccessful  . CHOLECYSTECTOMY    . COMPRESSION HIP SCREW Right 06/06/2012   Procedure: Open Reduction Internal Fixation Right HIp;  Surgeon: Denise Huh, MD;  Location: [facility] OR;  Service: Orthopedics;  Laterality: Right;  . ESOPHAGOGASTRODUODENOSCOPY N/A 06/09/2015   Procedure: ESOPHAGOGASTRODUODENOSCOPY (EGD);  Surgeon: Denise Rakes, MD;  Location: [facility] ENDOSCOPY;  Service: Endoscopy;  Laterality: N/A;  . FEMUR IM NAIL Right 05/24/2020   Procedure: INTRAMEDULLARY (IM) RETROGRADE FEMORAL NAILING;  Surgeon: Denise Lofts, MD;  Location: [facility] OR;  Service: Orthopedics;  Laterality: Right;  . IR PARACENTESIS  05/31/2020  . IR PARACENTESIS  07/03/2020  . IR PARACENTESIS  07/24/2020  . knee replacement     R  . NM MYOCAR PERF WALL MOTION  02/28/2009   No signficant ischemia  . PACEMAKER INSERTION  05/26/2004   Denise [name]  . PPM GENERATOR CHANGEOUT N/A 08/17/2017   Procedure: PPM GENERATOR CHANGEOUT;  Surgeon: Denise Fair, MD;  Location: [facility] INVASIVE CV LAB;  Service: Cardiovascular;  Laterality: N/A;  . THYROIDECTOMY    . TUBAL LIGATION    . US ECHOCARDIOGRAPHY  02/25/2011   mod to severe MR,LA severe dilated,mild annular ca+,mod. TR,mod PI,mod ca+ of the aortic valve leaflet    Current Medications: Current Meds  Medication Sig  . acetaminophen (TYLENOL) 325 MG tablet Take 2 tablets (650 mg total) by mouth every 6 (six) hours as needed for mild pain, fever or headache. (Patient taking differently: Take 650 mg by mouth in the morning and at bedtime.)  . atorvastatin (LIPITOR) 10 MG tablet TAKE ONE TABLET BY MOUTH EVERY EVENING  . ELIQUIS 5 MG TABS tablet Take 5 mg by mouth 2 (two) times daily.  . furosemide (LASIX) 40 MG tablet Take 1-2 tablets (40-80 mg total)  by mouth See admin instructions. Take 1 tablet (40mg ) by mouth daily except on Tuesday and Friday take 1 tablet (40 mg totally) 2 times daily (Patient taking differently: Take 40 mg by mouth See admin instructions. 40 mg once daily on Monday,Wednesday,Thursday,Saturday and Sunday 40 mg twice daily on Tuesday and friday)  . magnesium hydroxide (MILK OF MAGNESIA) 400 MG/5ML suspension Take 30 mLs by mouth as needed for mild constipation. If no BM in 3 days  . methimazole (TAPAZOLE) 10 MG tablet TAKE 1/2 TABLET BY MOUTH ONCE DAILY (Patient taking differently: Take 5 mg by mouth daily.)  . ondansetron (ZOFRAN) 4 MG tablet Take 1 tablet (4 mg total) by mouth every 8 (eight) hours as needed for nausea or vomiting.  [name] oxyCODONE (OXY IR/ROXICODONE) 5 MG immediate release tablet Take 2.5 mg by mouth See admin instructions. Every morning as needed for pain x 14 days  . potassium chloride SA (KLOR-CON) 20 MEQ tablet Take 40 mEq daily except on Tuesday and Friday take 60 mEg (3 tablets) (Patient taking differently: Take 40-60 mEq by mouth See admin instructions. 40 meq once daily on Monday,Wednesday,Thursday,Saturday and Sunday. 60 meq once daily on Tuesday  and friday)     Allergies:   Iohexol and Shrimp [shellfish allergy]   Social History   Socioeconomic History  . Marital status: Widowed    Spouse name: Not on file  . Number of children: Not on file  . Years of education: Not on file  . Highest education level: Not on file  Occupational History  . Not on file  Tobacco Use  . Smoking status: Never Smoker  . Smokeless tobacco: Never Used  Vaping Use  . Vaping Use: Never used  Substance and Sexual Activity  . Alcohol use: No  . Drug use: No  . Sexual activity: Not Currently  Other Topics Concern  . Not on file  Social History Narrative  . Not on file   Social Determinants of Health   Financial Resource Strain: Not on file  Food Insecurity: Not on file  Transportation Needs: Not on file   Physical Activity: Not on file  Stress: Not on file  Social Connections: Not on file     Family History: The patient's family history includes Asthma in her mother; Cancer - Colon in her brother; Gallbladder disease in her maternal grandmother; Heart attack in her brother and father; Heart disease in her child; Stroke in her maternal grandfather and sister.  ROS:   Please see the history of present illness.     All other systems reviewed and are negative.  EKGs/Labs/Other Studies Reviewed:    The following studies were reviewed today:  Echo 05/27/2020 1. Left ventricular ejection fraction, by estimation, is 50 to 55%. The  left ventricle has low normal function. The left ventricle demonstrates  regional wall motion abnormalities (see scoring diagram/findings for  description). Left ventricular diastolic  function could not be evaluated. There is the interventricular septum is  flattened in diastole ('D' shaped left ventricle), consistent with right  ventricular volume overload. There is severe akinesis of the left  ventricular, basal inferoseptal wall.  2. Right ventricular systolic function is normal. The right ventricular  size is moderately enlarged. There is severely elevated pulmonary artery  systolic pressure.  3. Left atrial size was moderately dilated.  4. Right atrial size was severely dilated.  5. The mitral valve is abnormal. Mild to moderate mitral valve  regurgitation. Moderate mitral annular calcification.  6. Tricuspid valve regurgitation is severe.  7. The aortic valve is tricuspid. There is severe calcifcation of the  aortic valve. There is moderate thickening of the aortic valve. Aortic  valve regurgitation is trivial. Mild aortic valve stenosis.   EKG:  EKG is not ordered today.    Recent Labs: 05/23/2020: TSH 1.975 05/31/2020: Magnesium 2.0 07/02/2020: ALT 12; B Natriuretic Peptide 343.0 07/03/2020: BUN 33; Creatinine, Ser 0.97; Hemoglobin 10.0;  Platelets 372; Potassium 5.0; Sodium 136  Recent Lipid Panel No results found for: CHOL, TRIG, HDL, CHOLHDL, VLDL, LDLCALC, LDLDIRECT   Risk Assessment/Calculations:    CHA2DS2-VASc Score = 6  This indicates a 9.7% annual risk of stroke. The patient's score is based upon: CHF History: Yes HTN History: Yes Diabetes History: Yes Stroke History: No Vascular Disease History: No Age Score: 2 Gender Score: 1      Physical Exam:    VS:  BP 133/68   Pulse 70   Ht 5' (1.524 m)   Wt 142 lb 12.8 oz (64.8 kg) Comment: Weighed at nursing facility 07/21/2020  SpO2 99%   BMI 27.89 kg/m     Wt Readings from Last 3 Encounters:  07/23/20 142 lb  12.8 oz (64.8 kg)  07/04/20 146 lb 13.2 oz (66.6 kg)  05/26/20 160 lb 11.5 oz (72.9 kg)     GEN: Frail, sitting in wheelchair HEENT: Normal NECK: No JVD; No carotid bruits LYMPHATICS: No lymphadenopathy CARDIAC: Irregularly irregular, no murmurs, rubs, gallops RESPIRATORY:  Clear to auscultation without rales, wheezing or rhonchi  ABDOMEN: Soft, non-tender.  Abdomen distended MUSCULOSKELETAL: 1+ lower extremity edema; No deformity  SKIN: Warm and dry NEUROLOGIC:  Alert and oriented x 3 PSYCHIATRIC:  Normal affect   ASSESSMENT:    1. Other ascites   2. Chronic combined systolic and diastolic heart failure (HCC)   3. Permanent atrial fibrillation (HCC)   4. Pacemaker   5. Severe tricuspid regurgitation   6. Controlled type 2 diabetes mellitus without complication, without long-term current use of insulin (HCC)   7. Stage 3 chronic kidney disease, unspecified whether stage 3a or 3b CKD (HCC)   8. Other cirrhosis of liver (HCC)    PLAN:    In order of problems listed above:  1. Recurrent ascites: Related to cirrhosis.  She is due for a third paracentesis tomorrow.  Long term prognosis poor.  2. Chronic combined systolic and diastolic heart failure: Currently she is on 40 mg Lasix with exception of 80 mg every Tuesday and Friday.  I  will increase her Lasix slightly to 80 mg every Tuesday Thursday and Saturday with 40 mg on all the other days.  Hopefully by keeping her on the drier side, we will decrease the frequency of paracentesis in the near future.  3. Permanent atrial fibrillation: Continue Eliquis  4. History of AV nodal ablation status post pacemaker: She is pacemaker dependent.  Followed by Dr. Royann [name]  5. Severe tricuspid regurgitation: Contributing to some degree of lower extremity edema  6. DM2: Followed by primary care provider  7. CKD stage III: Repeat basic metabolic panel in 2 weeks  8. Cirrhosis of liver: Contributing to recurrent ascites.        Medication Adjustments/Labs and Tests Ordered: Current medicines are reviewed at length with the patient today.  Concerns regarding medicines are outlined above.  Orders Placed This Encounter  Procedures  . Basic metabolic panel   No orders of the defined types were placed in this encounter.   Patient Instructions  Medication Instructions:  Lasix 80 mg (Tuesday,Thursday and Saturday ) 40 mg (Mon, Wens, Fri,Sun.) Potasium meq (60 meq Tues.Thur,Sat.) 40 meq on (Mon,Wens,Fri, Sun) *If you need a refill on your cardiac medications before your next appointment, please call your pharmacy*   Lab Work: BMP ( In 2 weeks) If you have labs (blood work) drawn today and your tests are completely normal, you will receive your results only by: [name] MyChart Message (if you have MyChart) OR . A paper copy in the mail If you have any lab test that is abnormal or we need to change your treatment, we will call you to review the results.   Testing/Procedures: No Testing   Follow-Up: At [facility], you and your health needs are our priority.  As part of our continuing mission to provide you with exceptional heart care, we have created designated Provider Care Teams.  These Care Teams include your primary Cardiologist (physician) and Advanced Practice Providers  (APPs -  Physician Assistants and Nurse Practitioners) who all work together to provide you with the care you need, when you need it.     Your next appointment:   3 month(s)  The format for your  next appointment:   In Person  Provider:   Nicki Guadalajarahomas Kelly, MD       Signed, Azalee CourseHao Shaylah Mcghie, [place]  07/25/2020 10:33 PM    [facility]

## 2020-07-23 NOTE — Patient Instructions (Signed)
Medication Instructions:  Lasix 80 mg (Tuesday,Thursday and Saturday ) 40 mg (Mon, Wens, Fri,Sun.) Potasium meq (60 meq Tues.Thur,Sat.) 40 meq on (Mon,Wens,Fri, Sun) *If you need a refill on your cardiac medications before your next appointment, please call your pharmacy*   Lab Work: BMP ( In 2 weeks) If you have labs (blood work) drawn today and your tests are completely normal, you will receive your results only by: [name] MyChart Message (if you have MyChart) OR . A paper copy in the mail If you have any lab test that is abnormal or we need to change your treatment, we will call you to review the results.   Testing/Procedures: No Testing   Follow-Up: At [facility], you and your health needs are our priority.  As part of our continuing mission to provide you with exceptional heart care, we have created designated Provider Care Teams.  These Care Teams include your primary Cardiologist (physician) and Advanced Practice Providers (APPs -  Physician Assistants and Nurse Practitioners) who all work together to provide you with the care you need, when you need it.     Your next appointment:   3 month(s)  The format for your next appointment:   In Person  Provider:   Nicki Guadalajara, MD

## 2020-07-24 ENCOUNTER — Ambulatory Visit (HOSPITAL_COMMUNITY)
Admission: RE | Admit: 2020-07-24 | Discharge: 2020-07-24 | Disposition: A | Discharge status: Home / Self Care | Payer: Medicare Other | Source: Ambulatory Visit | Attending: Internal Medicine | Admitting: Internal Medicine

## 2020-07-24 ENCOUNTER — Telehealth: Payer: Self-pay | Admitting: Physician Assistant

## 2020-07-24 DIAGNOSIS — R188 Other ascites: Secondary | ICD-10-CM | POA: Insufficient documentation

## 2020-07-24 DIAGNOSIS — K7460 Unspecified cirrhosis of liver: Secondary | ICD-10-CM | POA: Diagnosis present

## 2020-07-24 HISTORY — PX: IR PARACENTESIS: IMG2679

## 2020-07-24 MED ORDER — LIDOCAINE HCL 1 % IJ SOLN
INTRAMUSCULAR | Status: AC
  Filled 2020-07-24: qty 20

## 2020-07-24 MED ORDER — LIDOCAINE HCL 1 % IJ SOLN
INTRAMUSCULAR | Status: DC | PRN
  Administered 2020-07-24: 10 mL

## 2020-07-24 NOTE — Telephone Encounter (Signed)
     Victorino Dike with [facility] calling, she is asking if they can do the lab work there and will fax the result to [place]. She needs what lab work we need, she said to call [phone] and ask for Ms. Marando nurse

## 2020-07-24 NOTE — Procedures (Signed)
PROCEDURE SUMMARY:  Successful image-guided paracentesis from the right lateral abdomen.  Yielded 5.0 liters of clear amber fluid.  No immediate complications.  EBL < 1 mL Patient tolerated well.   Specimen was not sent for labs.  Please see imaging section of Epic for full dictation.  Villa Herb PA-C 07/24/2020 10:23 AM

## 2020-07-24 NOTE — Telephone Encounter (Signed)
Called patient, advised that they were looking to have a BMP in 2 weeks. They will draw this and fax it to [place]. Fax number was provided.  Will route to primary to make aware.  Thanks!

## 2020-07-25 ENCOUNTER — Encounter: Payer: Self-pay | Admitting: Physician Assistant

## 2020-09-13 DEATH — deceased

## 2020-11-11 ENCOUNTER — Ambulatory Visit: Payer: Medicare Other | Admitting: Cardiovascular Disease

## 2021-12-28 IMAGING — DX DG ABDOMEN 1V
1 series · 1 of 1 positions shown · non-contrast
Comparison: 05/28/2020.  CT 05/30/2020

CLINICAL DATA: Vomiting, abdominal distention

EXAM:
ABDOMEN - 1 VIEW

[abdomen kub]
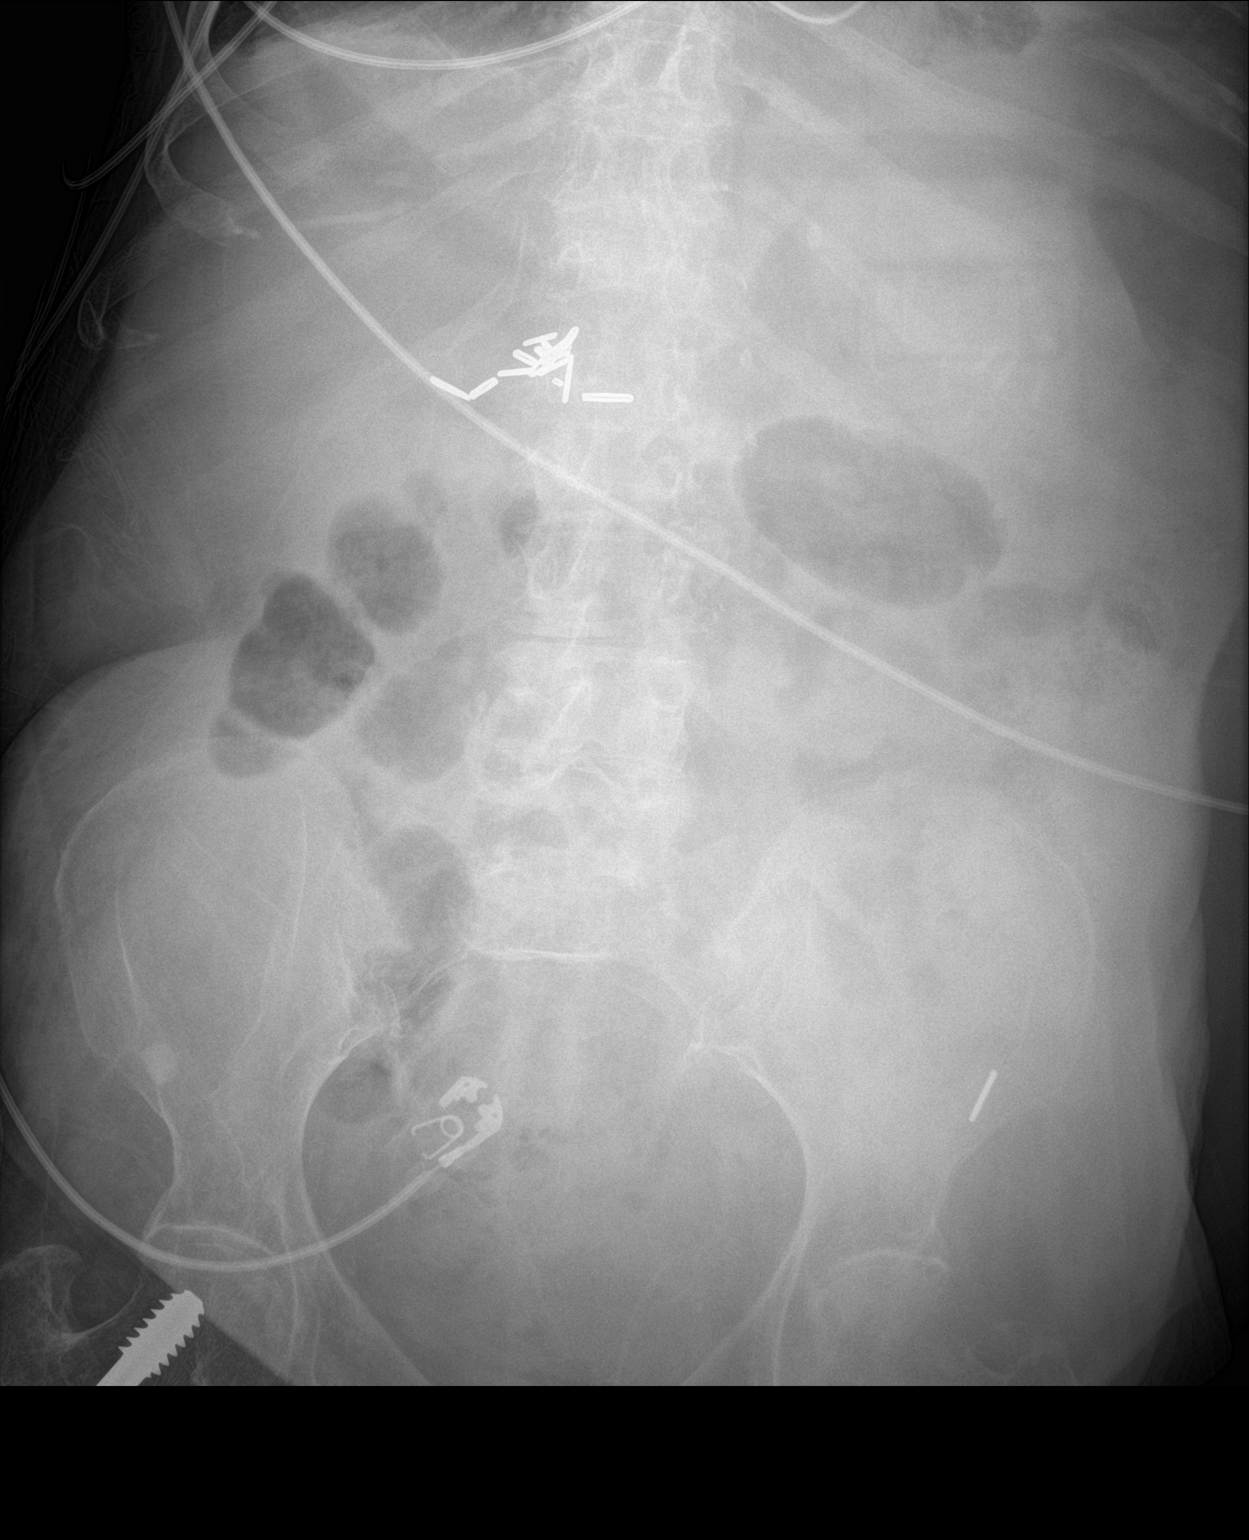

[1 of 1 positions shown; findings below may reference images not displayed]

FINDINGS: Nonobstructive bowel gas pattern. Prior cholecystectomy. No
organomegaly or suspicious calcification. No evidence of free air.
IMPRESSION: No bowel obstruction or free air.
